# Patient Record
Sex: Female | Born: 1945 | Race: Black or African American | Hispanic: No | Marital: Married | Smoking: Never smoker
Health system: Southern US, Community
[De-identification: ages and names within clinical notes are randomized; demographics above are authoritative.]

## PROBLEM LIST (undated history)

## (undated) DIAGNOSIS — D72819 Decreased white blood cell count, unspecified: Secondary | ICD-10-CM

## (undated) DIAGNOSIS — I10 Essential (primary) hypertension: Secondary | ICD-10-CM

## (undated) DIAGNOSIS — R569 Unspecified convulsions: Secondary | ICD-10-CM

## (undated) DIAGNOSIS — E785 Hyperlipidemia, unspecified: Secondary | ICD-10-CM

## (undated) DIAGNOSIS — F0390 Unspecified dementia without behavioral disturbance: Secondary | ICD-10-CM

## (undated) DIAGNOSIS — K30 Functional dyspepsia: Secondary | ICD-10-CM

## (undated) DIAGNOSIS — E7800 Pure hypercholesterolemia, unspecified: Secondary | ICD-10-CM

## (undated) DIAGNOSIS — R32 Unspecified urinary incontinence: Secondary | ICD-10-CM

## (undated) DIAGNOSIS — J309 Allergic rhinitis, unspecified: Secondary | ICD-10-CM

## (undated) DIAGNOSIS — F329 Major depressive disorder, single episode, unspecified: Secondary | ICD-10-CM

## (undated) DIAGNOSIS — M1990 Unspecified osteoarthritis, unspecified site: Secondary | ICD-10-CM

## (undated) DIAGNOSIS — E109 Type 1 diabetes mellitus without complications: Secondary | ICD-10-CM

## (undated) DIAGNOSIS — E11319 Type 2 diabetes mellitus with unspecified diabetic retinopathy without macular edema: Secondary | ICD-10-CM

## (undated) DIAGNOSIS — R51 Headache: Secondary | ICD-10-CM

## (undated) DIAGNOSIS — I509 Heart failure, unspecified: Secondary | ICD-10-CM

## (undated) DIAGNOSIS — L989 Disorder of the skin and subcutaneous tissue, unspecified: Secondary | ICD-10-CM

## (undated) DIAGNOSIS — F411 Generalized anxiety disorder: Secondary | ICD-10-CM

## (undated) DIAGNOSIS — D649 Anemia, unspecified: Secondary | ICD-10-CM

## (undated) HISTORY — PX: ABDOMINAL HYSTERECTOMY: SHX81

## (undated) HISTORY — DX: Essential (primary) hypertension: I10

## (undated) HISTORY — PX: OTHER SURGICAL HISTORY: SHX169

## (undated) HISTORY — DX: Major depressive disorder, single episode, unspecified: F32.9

## (undated) HISTORY — DX: Allergic rhinitis, unspecified: J30.9

## (undated) HISTORY — DX: Type 2 diabetes mellitus with unspecified diabetic retinopathy without macular edema: E11.319

## (undated) HISTORY — DX: Type 1 diabetes mellitus without complications: E10.9

## (undated) HISTORY — DX: Pure hypercholesterolemia, unspecified: E78.00

## (undated) HISTORY — DX: Unspecified osteoarthritis, unspecified site: M19.90

## (undated) HISTORY — DX: Headache: R51

## (undated) HISTORY — DX: Decreased white blood cell count, unspecified: D72.819

## (undated) HISTORY — DX: Functional dyspepsia: K30

## (undated) HISTORY — DX: Unspecified urinary incontinence: R32

## (undated) HISTORY — DX: Hyperlipidemia, unspecified: E78.5

## (undated) HISTORY — DX: Generalized anxiety disorder: F41.1

---

## 1988-07-20 DIAGNOSIS — M1990 Unspecified osteoarthritis, unspecified site: Secondary | ICD-10-CM

## 1988-07-20 HISTORY — DX: Unspecified osteoarthritis, unspecified site: M19.90

## 1988-07-20 HISTORY — PX: TOTAL KNEE ARTHROPLASTY: SHX125

## 1997-11-23 ENCOUNTER — Ambulatory Visit (HOSPITAL_COMMUNITY): Admission: RE | Admit: 1997-11-23 | Discharge: 1997-11-23 | Payer: Self-pay | Admitting: Urology

## 1998-02-15 ENCOUNTER — Ambulatory Visit (HOSPITAL_COMMUNITY): Admission: RE | Admit: 1998-02-15 | Discharge: 1998-02-15 | Payer: Self-pay | Admitting: *Deleted

## 1999-03-07 ENCOUNTER — Encounter: Payer: Self-pay | Admitting: Orthopedic Surgery

## 1999-03-11 ENCOUNTER — Ambulatory Visit (HOSPITAL_COMMUNITY): Admission: RE | Admit: 1999-03-11 | Discharge: 1999-03-11 | Payer: Self-pay | Admitting: Orthopedic Surgery

## 1999-04-24 ENCOUNTER — Encounter: Payer: Self-pay | Admitting: *Deleted

## 1999-04-24 ENCOUNTER — Ambulatory Visit (HOSPITAL_COMMUNITY): Admission: RE | Admit: 1999-04-24 | Discharge: 1999-04-24 | Payer: Self-pay | Admitting: *Deleted

## 1999-09-14 ENCOUNTER — Emergency Department (HOSPITAL_COMMUNITY): Admission: EM | Admit: 1999-09-14 | Discharge: 1999-09-14 | Payer: Self-pay | Admitting: Emergency Medicine

## 1999-09-15 ENCOUNTER — Encounter: Payer: Self-pay | Admitting: Emergency Medicine

## 1999-10-27 ENCOUNTER — Ambulatory Visit (HOSPITAL_COMMUNITY): Admission: RE | Admit: 1999-10-27 | Discharge: 1999-10-27 | Payer: Self-pay | Admitting: Gastroenterology

## 1999-10-27 HISTORY — PX: PANENDOSCOPY: SHX2159

## 2000-02-01 ENCOUNTER — Emergency Department (HOSPITAL_COMMUNITY): Admission: EM | Admit: 2000-02-01 | Discharge: 2000-02-02 | Payer: Self-pay | Admitting: Emergency Medicine

## 2000-05-07 ENCOUNTER — Encounter: Payer: Self-pay | Admitting: *Deleted

## 2000-05-07 ENCOUNTER — Ambulatory Visit (HOSPITAL_COMMUNITY): Admission: RE | Admit: 2000-05-07 | Discharge: 2000-05-07 | Payer: Self-pay | Admitting: *Deleted

## 2001-06-07 ENCOUNTER — Encounter (HOSPITAL_BASED_OUTPATIENT_CLINIC_OR_DEPARTMENT_OTHER): Payer: Self-pay | Admitting: General Surgery

## 2001-06-07 ENCOUNTER — Ambulatory Visit (HOSPITAL_COMMUNITY): Admission: RE | Admit: 2001-06-07 | Discharge: 2001-06-07 | Payer: Self-pay | Admitting: General Surgery

## 2001-12-13 ENCOUNTER — Encounter: Admission: RE | Admit: 2001-12-13 | Discharge: 2001-12-13 | Payer: Self-pay | Admitting: Urology

## 2001-12-13 ENCOUNTER — Encounter: Payer: Self-pay | Admitting: Urology

## 2002-01-17 ENCOUNTER — Emergency Department (HOSPITAL_COMMUNITY): Admission: EM | Admit: 2002-01-17 | Discharge: 2002-01-17 | Payer: Self-pay | Admitting: Emergency Medicine

## 2002-06-08 ENCOUNTER — Encounter: Payer: Self-pay | Admitting: Obstetrics & Gynecology

## 2002-06-08 ENCOUNTER — Ambulatory Visit (HOSPITAL_COMMUNITY): Admission: RE | Admit: 2002-06-08 | Discharge: 2002-06-08 | Payer: Self-pay | Admitting: Obstetrics & Gynecology

## 2002-09-13 ENCOUNTER — Encounter: Payer: Self-pay | Admitting: Orthopedic Surgery

## 2002-09-14 ENCOUNTER — Inpatient Hospital Stay (HOSPITAL_COMMUNITY): Admission: RE | Admit: 2002-09-14 | Discharge: 2002-09-18 | Payer: Self-pay | Admitting: Orthopedic Surgery

## 2002-09-14 ENCOUNTER — Encounter: Payer: Self-pay | Admitting: Orthopedic Surgery

## 2002-12-20 ENCOUNTER — Ambulatory Visit (HOSPITAL_BASED_OUTPATIENT_CLINIC_OR_DEPARTMENT_OTHER): Admission: RE | Admit: 2002-12-20 | Discharge: 2002-12-20 | Payer: Self-pay | Admitting: Pulmonary Disease

## 2003-03-04 ENCOUNTER — Emergency Department (HOSPITAL_COMMUNITY): Admission: EM | Admit: 2003-03-04 | Discharge: 2003-03-04 | Payer: Self-pay

## 2003-04-11 ENCOUNTER — Encounter: Payer: Self-pay | Admitting: Endocrinology

## 2003-04-11 ENCOUNTER — Encounter: Admission: RE | Admit: 2003-04-11 | Discharge: 2003-04-11 | Payer: Self-pay | Admitting: Endocrinology

## 2003-07-24 ENCOUNTER — Ambulatory Visit (HOSPITAL_COMMUNITY): Admission: RE | Admit: 2003-07-24 | Discharge: 2003-07-24 | Payer: Self-pay | Admitting: Obstetrics & Gynecology

## 2003-10-30 ENCOUNTER — Encounter: Admission: RE | Admit: 2003-10-30 | Discharge: 2004-01-28 | Payer: Self-pay | Admitting: Endocrinology

## 2003-12-14 HISTORY — PX: OTHER SURGICAL HISTORY: SHX169

## 2004-05-27 ENCOUNTER — Ambulatory Visit: Payer: Self-pay | Admitting: Endocrinology

## 2004-06-10 ENCOUNTER — Ambulatory Visit: Payer: Self-pay | Admitting: Endocrinology

## 2004-08-18 ENCOUNTER — Ambulatory Visit: Payer: Self-pay | Admitting: Endocrinology

## 2004-08-19 ENCOUNTER — Ambulatory Visit (HOSPITAL_COMMUNITY): Admission: RE | Admit: 2004-08-19 | Discharge: 2004-08-19 | Payer: Self-pay | Admitting: Obstetrics & Gynecology

## 2004-11-11 ENCOUNTER — Ambulatory Visit: Payer: Self-pay | Admitting: Endocrinology

## 2005-01-21 ENCOUNTER — Ambulatory Visit: Payer: Self-pay | Admitting: Endocrinology

## 2005-01-26 ENCOUNTER — Ambulatory Visit: Payer: Self-pay | Admitting: Endocrinology

## 2005-03-25 ENCOUNTER — Ambulatory Visit: Payer: Self-pay | Admitting: Endocrinology

## 2005-03-30 ENCOUNTER — Ambulatory Visit: Payer: Self-pay | Admitting: Endocrinology

## 2005-04-19 LAB — HM DEXA SCAN

## 2005-06-08 ENCOUNTER — Ambulatory Visit: Payer: Self-pay | Admitting: Endocrinology

## 2005-06-22 ENCOUNTER — Ambulatory Visit: Payer: Self-pay | Admitting: Endocrinology

## 2005-08-25 ENCOUNTER — Ambulatory Visit (HOSPITAL_COMMUNITY): Admission: RE | Admit: 2005-08-25 | Discharge: 2005-08-25 | Payer: Self-pay | Admitting: Obstetrics & Gynecology

## 2005-10-21 ENCOUNTER — Ambulatory Visit: Payer: Self-pay | Admitting: Endocrinology

## 2006-02-02 ENCOUNTER — Ambulatory Visit: Payer: Self-pay | Admitting: Endocrinology

## 2006-05-11 ENCOUNTER — Ambulatory Visit: Payer: Self-pay | Admitting: Endocrinology

## 2006-05-11 LAB — CONVERTED CEMR LAB
AST: 23 units/L (ref 0–37)
Albumin: 4 g/dL (ref 3.5–5.2)
BUN: 9 mg/dL (ref 6–23)
Basophils Relative: 1.1 % — ABNORMAL HIGH (ref 0.0–1.0)
Bilirubin Urine: NEGATIVE
Creatinine, Ser: 0.7 mg/dL (ref 0.4–1.2)
Eosinophil percent: 2.9 % (ref 0.0–5.0)
HCT: 36.3 % (ref 36.0–46.0)
HDL: 78.5 mg/dL (ref >39.0)
Hemoglobin: 11.5 g/dL — ABNORMAL LOW (ref 12.0–15.0)
LDL DIRECT: 126 mg/dL
MCHC: 31.7 g/dL (ref 30.0–36.0)
Neutro Abs: 1.4 10*3/uL (ref 1.4–7.7)
Neutrophils Relative %: 40 % — ABNORMAL LOW (ref 43.0–77.0)
Potassium: 3.9 meq/L (ref 3.5–5.1)
RDW: 13.8 % (ref 11.5–14.6)
Total Bilirubin: 1 mg/dL (ref 0.3–1.2)
Triglyceride fasting, serum: 42 mg/dL (ref 0–149)
Urine Glucose: NEGATIVE mg/dL

## 2006-05-17 ENCOUNTER — Ambulatory Visit: Payer: Self-pay | Admitting: Endocrinology

## 2006-05-17 HISTORY — PX: ELECTROCARDIOGRAM: SHX264

## 2006-05-25 ENCOUNTER — Ambulatory Visit: Payer: Self-pay | Admitting: Endocrinology

## 2006-05-30 ENCOUNTER — Encounter: Admission: RE | Admit: 2006-05-30 | Discharge: 2006-05-30 | Payer: Self-pay | Admitting: Endocrinology

## 2006-06-02 ENCOUNTER — Emergency Department (HOSPITAL_COMMUNITY): Admission: EM | Admit: 2006-06-02 | Discharge: 2006-06-02 | Payer: Self-pay | Admitting: Emergency Medicine

## 2006-07-26 ENCOUNTER — Ambulatory Visit: Payer: Self-pay | Admitting: Endocrinology

## 2007-02-08 ENCOUNTER — Encounter: Payer: Self-pay | Admitting: Endocrinology

## 2007-02-08 DIAGNOSIS — J309 Allergic rhinitis, unspecified: Secondary | ICD-10-CM

## 2007-02-08 DIAGNOSIS — E109 Type 1 diabetes mellitus without complications: Secondary | ICD-10-CM

## 2007-02-08 DIAGNOSIS — F329 Major depressive disorder, single episode, unspecified: Secondary | ICD-10-CM

## 2007-02-08 DIAGNOSIS — R32 Unspecified urinary incontinence: Secondary | ICD-10-CM

## 2007-02-08 DIAGNOSIS — F411 Generalized anxiety disorder: Secondary | ICD-10-CM | POA: Insufficient documentation

## 2007-02-08 DIAGNOSIS — F3289 Other specified depressive episodes: Secondary | ICD-10-CM

## 2007-02-08 DIAGNOSIS — I10 Essential (primary) hypertension: Secondary | ICD-10-CM

## 2007-02-08 HISTORY — DX: Allergic rhinitis, unspecified: J30.9

## 2007-02-08 HISTORY — DX: Generalized anxiety disorder: F41.1

## 2007-02-08 HISTORY — DX: Other specified depressive episodes: F32.89

## 2007-02-08 HISTORY — DX: Essential (primary) hypertension: I10

## 2007-02-08 HISTORY — DX: Unspecified urinary incontinence: R32

## 2007-02-08 HISTORY — DX: Major depressive disorder, single episode, unspecified: F32.9

## 2007-02-08 HISTORY — DX: Type 1 diabetes mellitus without complications: E10.9

## 2007-03-02 ENCOUNTER — Ambulatory Visit: Payer: Self-pay | Admitting: Endocrinology

## 2007-05-25 ENCOUNTER — Ambulatory Visit: Payer: Self-pay | Admitting: Endocrinology

## 2007-09-12 ENCOUNTER — Encounter: Payer: Self-pay | Admitting: Endocrinology

## 2007-09-21 ENCOUNTER — Encounter: Payer: Self-pay | Admitting: Endocrinology

## 2008-04-30 ENCOUNTER — Encounter: Payer: Self-pay | Admitting: Endocrinology

## 2008-06-02 ENCOUNTER — Emergency Department (HOSPITAL_COMMUNITY): Admission: EM | Admit: 2008-06-02 | Discharge: 2008-06-02 | Payer: Self-pay | Admitting: Emergency Medicine

## 2008-08-15 ENCOUNTER — Ambulatory Visit: Payer: Self-pay | Admitting: Endocrinology

## 2008-08-15 DIAGNOSIS — R634 Abnormal weight loss: Secondary | ICD-10-CM

## 2008-08-15 DIAGNOSIS — R05 Cough: Secondary | ICD-10-CM

## 2008-08-15 LAB — CONVERTED CEMR LAB
ALT: 25 units/L (ref 0–35)
AST: 16 units/L (ref 0–37)
Basophils Absolute: 0.1 10*3/uL (ref 0.0–0.1)
Basophils Relative: 2.1 % (ref 0.0–3.0)
Bilirubin, Direct: 0.2 mg/dL (ref 0.0–0.3)
CO2: 31 meq/L (ref 19–32)
Chloride: 100 meq/L (ref 96–112)
Creatinine, Ser: 0.7 mg/dL (ref 0.4–1.2)
Eosinophils Absolute: 0.1 10*3/uL (ref 0.0–0.7)
GFR calc non Af Amer: 90 mL/min
Hgb A1c MFr Bld: 12.6 % — ABNORMAL HIGH (ref 4.6–6.0)
Lymphocytes Relative: 43.5 % (ref 12.0–46.0)
MCHC: 33.5 g/dL (ref 30.0–36.0)
MCV: 85.7 fL (ref 78.0–100.0)
Neutrophils Relative %: 41.3 % — ABNORMAL LOW (ref 43.0–77.0)
Platelets: 239 10*3/uL (ref 150–400)
Potassium: 4.4 meq/L (ref 3.5–5.1)
RBC: 4.79 M/uL (ref 3.87–5.11)
RDW: 12.9 % (ref 11.5–14.6)
Sodium: 137 meq/L (ref 135–145)
Total Bilirubin: 1.4 mg/dL — ABNORMAL HIGH (ref 0.3–1.2)

## 2008-08-20 ENCOUNTER — Ambulatory Visit: Payer: Self-pay | Admitting: Endocrinology

## 2008-09-19 ENCOUNTER — Ambulatory Visit: Payer: Self-pay | Admitting: Endocrinology

## 2008-09-27 ENCOUNTER — Telehealth (INDEPENDENT_AMBULATORY_CARE_PROVIDER_SITE_OTHER): Payer: Self-pay | Admitting: *Deleted

## 2008-11-05 ENCOUNTER — Ambulatory Visit: Payer: Self-pay | Admitting: Endocrinology

## 2008-11-05 DIAGNOSIS — E7800 Pure hypercholesterolemia, unspecified: Secondary | ICD-10-CM

## 2008-11-05 HISTORY — DX: Pure hypercholesterolemia, unspecified: E78.00

## 2008-11-05 LAB — CONVERTED CEMR LAB
ALT: 20 units/L (ref 0–35)
AST: 19 units/L (ref 0–37)
Albumin: 4.3 g/dL (ref 3.5–5.2)
BUN: 17 mg/dL (ref 6–23)
Basophils Relative: 0.6 % (ref 0.0–3.0)
Bilirubin Urine: NEGATIVE
Cholesterol: 292 mg/dL — ABNORMAL HIGH (ref 0–200)
Direct LDL: 158.1 mg/dL
Eosinophils Relative: 4 % (ref 0.0–5.0)
HCT: 39.3 % (ref 36.0–46.0)
HDL: 108.3 mg/dL (ref >39.00)
Hemoglobin: 13.5 g/dL (ref 12.0–15.0)
Hgb A1c MFr Bld: 12.7 % — ABNORMAL HIGH (ref 4.6–6.5)
Lymphs Abs: 1.7 10*3/uL (ref 0.7–4.0)
MCV: 83.7 fL (ref 78.0–100.0)
Monocytes Absolute: 0.3 10*3/uL (ref 0.1–1.0)
Neutro Abs: 1.1 10*3/uL — ABNORMAL LOW (ref 1.4–7.7)
Platelets: 232 10*3/uL (ref 150.0–400.0)
Specific Gravity, Urine: >=1.03 (ref 1.000–1.030)
TSH: 0.65 microintl units/mL (ref 0.35–5.50)
Total Bilirubin: 1.6 mg/dL — ABNORMAL HIGH (ref 0.3–1.2)
Total CHOL/HDL Ratio: 3
Total Protein, Urine: NEGATIVE mg/dL
Total Protein: 7.1 g/dL (ref 6.0–8.3)
Triglycerides: 35 mg/dL (ref 0.0–149.0)
WBC: 3.2 10*3/uL — ABNORMAL LOW (ref 4.5–10.5)
pH: 5.5 (ref 5.0–8.0)

## 2008-11-06 ENCOUNTER — Telehealth (INDEPENDENT_AMBULATORY_CARE_PROVIDER_SITE_OTHER): Payer: Self-pay | Admitting: *Deleted

## 2008-11-06 DIAGNOSIS — R51 Headache: Secondary | ICD-10-CM

## 2008-11-06 DIAGNOSIS — R519 Headache, unspecified: Secondary | ICD-10-CM | POA: Insufficient documentation

## 2008-11-06 HISTORY — DX: Headache: R51

## 2008-11-09 ENCOUNTER — Telehealth (INDEPENDENT_AMBULATORY_CARE_PROVIDER_SITE_OTHER): Payer: Self-pay | Admitting: *Deleted

## 2008-11-16 ENCOUNTER — Encounter (INDEPENDENT_AMBULATORY_CARE_PROVIDER_SITE_OTHER): Payer: Self-pay | Admitting: *Deleted

## 2008-12-24 ENCOUNTER — Encounter: Payer: Self-pay | Admitting: Endocrinology

## 2009-02-04 ENCOUNTER — Encounter: Payer: Self-pay | Admitting: Internal Medicine

## 2009-05-07 ENCOUNTER — Telehealth: Payer: Self-pay | Admitting: Endocrinology

## 2009-05-17 ENCOUNTER — Telehealth (INDEPENDENT_AMBULATORY_CARE_PROVIDER_SITE_OTHER): Payer: Self-pay | Admitting: *Deleted

## 2009-06-07 ENCOUNTER — Telehealth (INDEPENDENT_AMBULATORY_CARE_PROVIDER_SITE_OTHER): Payer: Self-pay | Admitting: *Deleted

## 2009-11-30 ENCOUNTER — Emergency Department (HOSPITAL_COMMUNITY): Admission: EM | Admit: 2009-11-30 | Discharge: 2009-11-30 | Payer: Self-pay | Admitting: Emergency Medicine

## 2010-04-30 ENCOUNTER — Encounter: Payer: Self-pay | Admitting: Endocrinology

## 2010-04-30 ENCOUNTER — Telehealth: Payer: Self-pay | Admitting: Endocrinology

## 2010-06-24 ENCOUNTER — Ambulatory Visit: Payer: Self-pay | Admitting: Endocrinology

## 2010-06-24 LAB — CONVERTED CEMR LAB
ALT: 17 units/L (ref 0–35)
Albumin: 4.3 g/dL (ref 3.5–5.2)
Bilirubin, Direct: 0.2 mg/dL (ref 0.0–0.3)
CO2: 31 meq/L (ref 19–32)
Calcium: 9.4 mg/dL (ref 8.4–10.5)
Cholesterol: 284 mg/dL — ABNORMAL HIGH (ref 0–200)
Creatinine, Ser: 0.5 mg/dL (ref 0.4–1.2)
Direct LDL: 154.4 mg/dL
Eosinophils Relative: 1.6 % (ref 0.0–5.0)
GFR calc non Af Amer: 151.57 mL/min (ref >60.00)
Glucose, Bld: 325 mg/dL — ABNORMAL HIGH (ref 70–99)
HCT: 38.6 % (ref 36.0–46.0)
Hemoglobin: 13.1 g/dL (ref 12.0–15.0)
Lymphs Abs: 1.6 10*3/uL (ref 0.7–4.0)
MCV: 85.6 fL (ref 78.0–100.0)
Monocytes Absolute: 0.4 10*3/uL (ref 0.1–1.0)
Monocytes Relative: 9.3 % (ref 3.0–12.0)
Neutro Abs: 1.7 10*3/uL (ref 1.4–7.7)
Platelets: 258 10*3/uL (ref 150.0–400.0)
RDW: 14.2 % (ref 11.5–14.6)
Sodium: 135 meq/L (ref 135–145)
Total Protein: 7.1 g/dL (ref 6.0–8.3)
Triglycerides: 33 mg/dL (ref 0.0–149.0)
WBC: 3.8 10*3/uL — ABNORMAL LOW (ref 4.5–10.5)

## 2010-06-30 ENCOUNTER — Telehealth: Payer: Self-pay | Admitting: Endocrinology

## 2010-07-02 ENCOUNTER — Encounter (INDEPENDENT_AMBULATORY_CARE_PROVIDER_SITE_OTHER): Payer: Self-pay | Admitting: *Deleted

## 2010-08-05 ENCOUNTER — Ambulatory Visit: Admit: 2010-08-05 | Payer: Self-pay | Admitting: Endocrinology

## 2010-08-05 ENCOUNTER — Ambulatory Visit: Admit: 2010-08-05 | Payer: Self-pay | Admitting: Internal Medicine

## 2010-08-05 ENCOUNTER — Encounter (INDEPENDENT_AMBULATORY_CARE_PROVIDER_SITE_OTHER): Payer: Self-pay | Admitting: *Deleted

## 2010-08-09 ENCOUNTER — Encounter (HOSPITAL_BASED_OUTPATIENT_CLINIC_OR_DEPARTMENT_OTHER): Payer: Self-pay | Admitting: General Surgery

## 2010-08-10 ENCOUNTER — Encounter: Payer: Self-pay | Admitting: Obstetrics & Gynecology

## 2010-08-10 ENCOUNTER — Encounter: Payer: Self-pay | Admitting: Endocrinology

## 2010-08-13 ENCOUNTER — Ambulatory Visit: Admit: 2010-08-13 | Payer: Self-pay | Admitting: Internal Medicine

## 2010-08-17 LAB — CONVERTED CEMR LAB
ALT: 21 units/L (ref 0–35)
BUN: 7 mg/dL (ref 6–23)
Basophils Absolute: 0 10*3/uL (ref 0.0–0.1)
Bilirubin, Direct: 0.3 mg/dL (ref 0.0–0.3)
CO2: 32 meq/L (ref 19–32)
Calcium: 9.3 mg/dL (ref 8.4–10.5)
Cholesterol: 149 mg/dL (ref 0–200)
Creatinine,U: 109.3 mg/dL
Eosinophils Absolute: 0.1 10*3/uL (ref 0.0–0.6)
GFR calc Af Amer: 130 mL/min
GFR calc non Af Amer: 107 mL/min
Glucose, Bld: 283 mg/dL — ABNORMAL HIGH (ref 70–99)
HDL: 78.7 mg/dL (ref >39.0)
Hemoglobin, Urine: NEGATIVE
Hgb A1c MFr Bld: 12.5 % — ABNORMAL HIGH (ref 4.6–6.0)
Ketones, ur: NEGATIVE mg/dL
MCHC: 33.8 g/dL (ref 30.0–36.0)
MCV: 84.8 fL (ref 78.0–100.0)
Microalb Creat Ratio: 7.3 mg/g (ref 0.0–30.0)
Microalb, Ur: 0.8 mg/dL (ref 0.0–1.9)
Monocytes Relative: 8 % (ref 3.0–11.0)
Neutro Abs: 1.8 10*3/uL (ref 1.4–7.7)
Platelets: 221 10*3/uL (ref 150–400)
Potassium: 4.2 meq/L (ref 3.5–5.1)
RBC: 4.01 M/uL (ref 3.87–5.11)
Total CHOL/HDL Ratio: 1.9
Total Protein: 6.3 g/dL (ref 6.0–8.3)
Triglycerides: 22 mg/dL (ref 0–149)
Urine Glucose: >=1000 mg/dL — CR
Urobilinogen, UA: 0.2 (ref 0.0–1.0)
pH: 6 (ref 5.0–8.0)

## 2010-08-21 NOTE — Assessment & Plan Note (Signed)
Summary: FU/ DIDN'T WANT A PHYSICAL/NWS  #   Vital Signs:  Patient profile:   65 year old female Height:      64 inches (162.56 cm) Weight:      131 pounds (59.55 kg) BMI:     22.57 O2 Sat:      99 % on Room air Temp:     98.4 degrees F (36.89 degrees C) oral Pulse rate:   95 / minute BP sitting:   138 / 70  (left arm) Cuff size:   regular  Vitals Entered By: Brenton Grills CMA Duncan Dull) (June 24, 2010 2:32 PM)  O2 Flow:  Room air CC: Follow-up visit per pt/aj Is Patient Diabetic? Yes   Primary Ernie Sagrero:  Dr. Everardo All  CC:  Follow-up visit per pt/aj.  History of Present Illness: the status of at least 3 ongoing medical problems is addressed today: dm: no cbg record, but states cbg's are well-controlled.  she has not taken her insulin since 1 month ago.   denies sob. dyslipidemia: she does not take vytorin.  denies chest pain weight loss: persists.  denies n/v. htn: she does not take normodyne  Current Medications (verified): 1)  Premarin 0.625 Mg  Tabs (Estrogens Conjugated) .... Take 1 By Mouth Qd 2)  Allegra 180 Mg  Tabs (Fexofenadine Hcl) .... Take Prn 3)  Vicodin 5-500 Mg  Tabs (Hydrocodone-Acetaminophen) .... Take 1 Q 4 Hours Prn 4)  Trazodone Hcl 50 Mg  Tabs (Trazodone Hcl) .... Take 1 Qhs 5)  Bd U/f Mini Pen Needle 31g X 5 Mm Misc (Insulin Pen Needle) .... Use As Directed Physical Is Due in October No Addtional Refills Until Appt 6)  Carvedilol 3.125 Mg Tabs (Carvedilol) .... Bid 7)  Metformin Hcl 500 Mg Xr24h-Tab (Metformin Hcl) .... Take 1 Two Times A Day 8)  Citalopram Hydrobromide 40 Mg Tabs (Citalopram Hydrobromide) .... Qd 9)  Novolog Mix 70/30 Flexpen 70-30 % Susp (Insulin Aspart Prot & Aspart) .Marland Kitchen.. 10 Units Qam 10)  Vytorin 10-80 Mg Tabs (Ezetimibe-Simvastatin) .... Take 1 By Mouth At Bedtime 11)  Labetalol Hcl 200 Mg Tabs (Labetalol Hcl) .... Take 2 By Mouth Two Times A Day 12)  Vesicare 5 Mg Tabs (Solifenacin Succinate) .Marland Kitchen.. 1 Tab Qam 13)  Fluticasone  Propionate 50 Mcg/act Susp (Fluticasone Propionate) .... 2 Sprays Each Side Qd  Allergies (verified): 1)  ! Aspirin 2)  ! Codeine  Past History:  Past Medical History: Last updated: 09/19/2008 Allergic rhinitis Anxiety Depression Diabetes mellitus, type I Hypertension Urinary incontinence Non-Ulcer Dyspepsia Dyslipidemia Leukopenia Background DM Retinopathy Disabled due 2 OA (1990)  Review of Systems  The patient denies abdominal pain and hematochezia.    Physical Exam  General:  normal appearance.   Extremities:  no edema Additional Exam:  Hemoglobin A1C       [H]  15.1 %                      4.6-6.5  Cholesterol          [H]  284 mg/dL                   0-454   Triglycerides             33.0 mg/dL                  0.9-811.9   HDL  97.50 mg/dL                 >11.91    White Cell Count     [L]  3.8 K/uL                    4.5-10.5   Red Cell Count            4.51 Mil/uL                 3.87-5.11   Hemoglobin                13.1 g/dL                   47.8-29.5   Hematocrit                38.6 %                      36.0-46.0   Platelet Count            258.0 K/uL                  150.0-400.0  Cholesterol LDL     154.4 mg/dL    Impression & Recommendations:  Problem # 1:  DIABETES MELLITUS, TYPE I (ICD-250.01) Assessment Deteriorated very poor control  Problem # 2:  HYPERCHOLESTEROLEMIA (ICD-272.0) uncertain etiology  Problem # 3:  WEIGHT LOSS (ICD-783.21) prob due to #1  Problem # 4:  HYPERTENSION (ICD-401.9) well-controlled  Medications Added to Medication List This Visit: 1)  Humalog Mix 75/25 Kwikpen 75-25 % Susp (Insulin lispro prot & lispro) .Marland Kitchen.. 10 units with the first meal of the day, and pen needles once daily 2)  Bilat Screening Mammogram   Other Orders: Flu Vaccine 45yrs + MEDICARE PATIENTS (A2130) Administration Flu vaccine - MCR (G0008) Pneumococcal Vaccine (86578) Admin 1st Vaccine (46962) TLB-A1C / Hgb A1C  (Glycohemoglobin) (83036-A1C) TLB-Lipid Panel (80061-LIPID) TLB-CBC Platelet - w/Differential (85025-CBCD) TLB-BMP (Basic Metabolic Panel-BMET) (80048-METABOL) TLB-TSH (Thyroid Stimulating Hormone) (84443-TSH) TLB-Hepatic/Liver Function Pnl (80076-HEPATIC) Est. Patient Level IV (95284)  Patient Instructions: 1)  blood tests are being ordered for you today.  please call [phone] to hear your test results. 2)  in case your blood sugar is high, here is a printed prescription for humalog 75/25, 10 units, with the first meal of the day. 3)  check your blood glucose 2 times a day.  vary the time of day between before the 3 meals and at bedtime.  also check if you feel as though your glucose might be very high or too low.  bring a record of this to your doctor appointments.   4)  Please schedule a follow-up appointment in 6 weeks. 5)  here is a prescription to get a mammogram. 6)  (update: i left message on phone-tree:  start insulin.  ret 1-2 weeks. you should take zocor) Prescriptions: BILAT SCREENING MAMMOGRAM   #0 x 0   Entered and Authorized by:   Minus Breeding MD   Signed by:   Minus Breeding MD on 06/24/2010   Method used:   Print then Give to Patient   RxID:   [id number] HUMALOG MIX 75/25 KWIKPEN 75-25 % SUSP (INSULIN LISPRO PROT & LISPRO) 10 units with the first meal of the day, and pen needles once daily  #1 box x 0   Entered and Authorized by:   Minus Breeding MD   Signed by:  Minus Breeding MD on 06/24/2010   Method used:   Print then Give to Patient   RxID:   [id number] HUMALOG MIX 75/25 KWIKPEN 75-25 % SUSP (INSULIN LISPRO PROT & LISPRO) 10 units with the first meal of the day, and pen needles once daily  #1 box x 0   Entered and Authorized by:   Minus Breeding MD   Signed by:   Minus Breeding MD on 06/24/2010   Method used:   Print then Give to Patient   RxID:   [id number]    Orders Added: 1)  Flu Vaccine 41yrs + MEDICARE PATIENTS [Q2039] 2)   Administration Flu vaccine - MCR [G0008] 3)  Pneumococcal Vaccine [90732] 4)  Admin 1st Vaccine [90471] 5)  TLB-A1C / Hgb A1C (Glycohemoglobin) [83036-A1C] 6)  TLB-Lipid Panel [80061-LIPID] 7)  TLB-CBC Platelet - w/Differential [85025-CBCD] 8)  TLB-BMP (Basic Metabolic Panel-BMET) [80048-METABOL] 9)  TLB-TSH (Thyroid Stimulating Hormone) [84443-TSH] 10)  TLB-Hepatic/Liver Function Pnl [80076-HEPATIC] 11)  Est. Patient Level IV [84696]   Immunizations Administered:  Pneumonia Vaccine:    Vaccine Type: Pneumovax    Site: left deltoid    Mfr: Merck    Dose: 0.5 ml    Route: IM    Given by: Brenton Grills CMA (AAMA)    Exp. Date: 11/14/2011    Lot #: [id number]    VIS given: 06/24/09 version given June 24, 2010.   Immunizations Administered:  Pneumonia Vaccine:    Vaccine Type: Pneumovax    Site: left deltoid    Mfr: Merck    Dose: 0.5 ml    Route: IM    Given by: Brenton Grills CMA (AAMA)    Exp. Date: 11/14/2011    Lot #: [id number]    VIS given: 06/24/09 version given June 24, 2010.  Flu Vaccine Consent Questions     Do you have a history of severe allergic reactions to this vaccine? no    Any prior history of allergic reactions to egg and/or gelatin? no    Do you have a sensitivity to the preservative Thimersol? no    Do you have a past history of Guillan-Barre Syndrome? no    Do you currently have an acute febrile illness? no    Have you ever had a severe reaction to latex? no    Vaccine information given and explained to patient? yes    Are you currently pregnant? no    Lot Number:[id number]   Exp Date:01/17/2011   Site Given  Right Deltoid IM       .[email or web address]

## 2010-08-21 NOTE — Medication Information (Signed)
Summary: Diabetes Supplies[facility]  [facility]abetes Supplies/[facility]   Imported By: Sherian Rein 05/02/2010 13:55:45  _____________________________________________________________________  External Attachment:    Type:   Image     Comment:   External Document

## 2010-08-21 NOTE — Progress Notes (Signed)
Summary: CPX due  Phone Note Outgoing Call Call back at [facility] Phone [phone]   Call placed by: Brenton Grills MA,  April 30, 2010 3:35 PM Call placed to: Patient Summary of Call: Per MD, pt is due for CPX. Left message for pt to callback office Initial call taken by: Brenton Grills MA,  April 30, 2010 3:35 PM  Follow-up for Phone Call        pt states she will callback to schedule CPX appointment  Follow-up by: Brenton Grills MA,  May 01, 2010 4:17 PM

## 2010-08-21 NOTE — Letter (Signed)
Summary: Pre Visit No Show Letter  [facility]  [address] [place], [place] [address]   Phone: [phone]  Fax: [phone]        August 05, 2010 MRN: [id number]    TERIN DIEROLF [address] [place], [place]  [address]    Dear Ms. POHLMAN,   We have been unable to reach you by phone concerning the pre-procedure visit that you missed on Tuesday 08/05/2010. For this reason,your procedure scheduled on Wednesday 08/13/2010 has been cancelled. Our scheduling staff will gladly assist you with rescheduling your appointments at a more convenient time. Please call our office at [phone] between the hours of 8:00am and 5:00pm, press option #2 to reach an appointment scheduler. Please consider updating your contact numbers at this time so that we can reach you by phone in the future with schedule changes or results.    Thank you,    Ezra Sites RN [facility]

## 2010-08-21 NOTE — Progress Notes (Signed)
Summary: colonoscopy  ---- Converted from flag ---- ---- 06/25/2010 1:18 PM, Brenton Grills CMA (AAMA) wrote: chart ordered, per chart pt has not had a colonoscopy  ---- 06/24/2010 3:16 PM, Minus Breeding MD wrote: paper chart please ? colonoscopy ------------------------------  please call pt, and advise her that colonoscopy is due, thank you.Phone Note Outgoing Call   Call placed by: Brenton Grills CMA Duncan Dull),  June 30, 2010 11:30 AM Call placed to: Patient Summary of Call: Pt advised that colonoscopy is due, pt would like to come in January near her appt date  Initial call taken by: Brenton Grills CMA Duncan Dull),  June 30, 2010 11:31 AM  Follow-up for Phone Call        i requested colonoscopy Follow-up by: Minus Breeding MD,  June 30, 2010 12:39 PM

## 2010-08-21 NOTE — Letter (Signed)
Summary: Pre Visit Letter Revised  [facility]  [address] [place], [place] [address]   Phone: [phone]  Fax: [phone]        07/02/2010 MRN: [id number] Dana [name] [address] [place], [place]  [address]             Procedure Date:  08/13/2010  Welcome to the Gastroenterology Division at [facility].    You are scheduled to see a nurse for your pre-procedure visit on 08/05/2010 at 2:30PM on the 3rd floor at [facility], [address]  We ask that you try to arrive at our office 15 minutes prior to your appointment time to allow for check-in.  Please take a minute to review the attached form.  If you answer "Yes" to one or more of the questions on the first page, we ask that you call the person listed at your earliest opportunity.  If you answer "No" to all of the questions, please complete the rest of the form and bring it to your appointment.    Your nurse visit will consist of discussing your medical and surgical history, your immediate family medical history, and your medications.   If you are unable to list all of your medications on the form, please bring the medication bottles to your appointment and we will list them.  We will need to be aware of both prescribed and over the counter drugs.  We will need to know exact dosage information as well.    Please be prepared to read and sign documents such as consent forms, a financial agreement, and acknowledgement forms.  If necessary, and with your consent, a friend or relative is welcome to sit-in on the nurse visit with you.  Please bring your insurance card so that we may make a copy of it.  If your insurance requires a referral to see a specialist, please bring your referral form from your primary care physician.  No co-pay is required for this nurse visit.     If you cannot keep your appointment, please call [phone] to cancel or reschedule prior to your appointment date.  This allows [place]  the opportunity to schedule an appointment for another patient in need of care.    Thank you for choosing [facility] for your medical needs.  We appreciate the opportunity to care for you.  Please visit [place] at our website  to learn more about our practice.  Sincerely, The Gastroenterology Division

## 2010-12-05 NOTE — Assessment & Plan Note (Signed)
[facility]                                   ON-CALL NOTE   Dana [name], Dana [name]                       MRN:          [id number]  DATE:05/29/2006                            DOB:          Dec 28, 1945    TIME CALL RECEIVED:  4:07 P.M.   TELEPHONE NUMBER:  [phone].   CALL RECEIVED:  The message in my pager was a question about medications.  Unfortunately, I tried to call her numerous times this evening with no  answer. I finally left a message on her answering machine to call me back if  she still needed assistance.    ______________________________  Tera Mater. Clent Ridges, MD    SAF/MedQ  DD: 05/29/2006  DT: 05/29/2006  Job #: [id number]   cc:   Gregary Signs A. Everardo All, MD

## 2010-12-05 NOTE — Op Note (Signed)
NAME:  Dana [name], Dana [name]                          ACCOUNT NO.:  [id number]   MEDICAL RECORD NO.:  [id number]                   PATIENT TYPE:  INP   LOCATION:  2550                                 FACILITY:  [facility]   PHYSICIAN:  Myrtie Neither, M.D.                 DATE OF BIRTH:  1946-03-16   DATE OF PROCEDURE:  09/14/2002  DATE OF DISCHARGE:                                 OPERATIVE REPORT   PREOPERATIVE DIAGNOSIS:  Painful left total knee.   POSTOPERATIVE DIAGNOSIS:  Poly wear, tibial component, and split end,  patellar component with loosening.   PROCEDURE:  Total knee revision with revision of the patella as well as the  tibial polyethylene.   DESCRIPTION OF PROCEDURE:  The patient was taken to the operating room after  given adequate preoperative medication, given general anesthesia and  intubated.  The left knee and lower leg were prepped with Duraprep and  draped in a sterile manner.  Tourniquet and Bovie were used for hemostasis.  A midline incision was made over the left knee going through her old  previous scar, going through the skin and subcutaneous tissue.  Sharp and  blunt dissection was made both medially and laterally.  A medial paramedian  incision was made through the capsule through the capsule from the tibial  tuberosity up to the quadriceps.  The patella was reflected laterally.  Immediately, a split in the patella surface was noted which appeared to  demonstrate some separation of the poly as a blister-type lesion.  The  tibial poly itself was shown to demonstrate wear with thinning.  The femoral  component was intact, as well as the tibial tray.  Synovial tissue was  resected about the patella and the patella was lifted out, along with the  methylmethacrylate.  The surface was roughened up with a curette and drill  bit.  Small holes were drilled into the patella to allow cement purchase.  Copious irrigation with Simpulse was done.  With the knee in the flexed  position, a T in the tibial poly was removed and the polyethylene tibial  component was removed.  Again, this was demonstrating wear both medially and  laterally, with more wear over the medial compartment.  Measurement  demonstrated that the tibial component had gone from a 10 mm thickness down  to 6 mm over the medial compartment, and 7 mm over the lateral compartment.  Copious irrigation was done.  Cultures - anaerobic and aerobic and gram  stains were done prior to removal of the components.  With the knee in the  slightly extended position due to the wear, a 12 mm trial was put in place  and was found to give full extension, full flexion, good stability medially  and laterally.  With the tibial and patellar trial, again full flexion, full  extension, no subluxation of the patella was noted.  The tibial component  was then put in place and locked with the key.  The patella, after  irrigation was done, was then cemented in place and excess  methylmethacrylate was removed.  Further copious irrigation with antibiotic  solution was done.  This was followed with wound closure with 0 Vicryl for  the fascia, 2-0 for the subcutaneous, skin staples for the skin.  Compressive dressing was applied and a knee immobilizer applied.  The  patient tolerated the procedure quite well and went to the recovery room in  stable and satisfactory condition.                                               Myrtie Neither, M.D.    AC/MEDQ  D:  09/14/2002  T:  09/14/2002  Job:  [id number]
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
## 2010-12-05 NOTE — Assessment & Plan Note (Signed)
[facility]                                   ON-CALL NOTE   Dana [name], Dana [name]                       MRN:          [id number]  DATE:05/29/2006                            DOB:          03/03/1946    TELEPHONE TRIAGE NOTE   TIME RECEIVED:  10:24 p.m.   The caller is the same.  She sees Dr. Everardo All.  Telephone [phone].   The patient is scheduled for an MRI scan of her head tomorrow afternoon at  4:30.  She is extremely claustrophobic, and is requesting that I call in a  Valium tablet for her to take beforehand.  She has used this in the past,  and been successful.  She tried to contact Dr. George Hugh office twice this  past week for this request, but never got an answer.   Her allergies include ASPIRIN and CODEINE.   My response is to call in a single Valium 5 mg tablet to take 1 hour prior  to her procedure with no refills to [facility] at [phone].    ______________________________  Tera Mater. Clent Ridges, MD    SAF/MedQ  DD: 05/29/2006  DT: 05/29/2006  Job #: [id number]   cc:   Gregary Signs A. Everardo All, MD

## 2011-03-20 ENCOUNTER — Telehealth: Payer: Self-pay | Admitting: *Deleted

## 2011-03-20 NOTE — Telephone Encounter (Signed)
Need rx for Cle[employer] Choice meter strips and lancets. Pt is testing qd.  Call Rx in at [phone] [facility]

## 2011-03-24 NOTE — Telephone Encounter (Signed)
Pt needs testing supplies. Fax for diabetic testing supplies will be sent to main fax.

## 2011-06-10 ENCOUNTER — Telehealth: Payer: Self-pay | Admitting: *Deleted

## 2011-06-10 NOTE — Telephone Encounter (Signed)
Per MD, pt is due for F/U OV. Pt states that she doesn't have transportation at the moment and will callback office when she does to make an appointment.

## 2011-06-23 ENCOUNTER — Ambulatory Visit: Payer: Self-pay | Admitting: Endocrinology

## 2011-06-25 ENCOUNTER — Ambulatory Visit: Payer: Self-pay | Admitting: Endocrinology

## 2011-06-25 DIAGNOSIS — Z0289 Encounter for other administrative examinations: Secondary | ICD-10-CM

## 2011-06-29 ENCOUNTER — Ambulatory Visit: Payer: Self-pay | Admitting: Endocrinology

## 2011-06-29 DIAGNOSIS — Z0289 Encounter for other administrative examinations: Secondary | ICD-10-CM

## 2011-06-30 ENCOUNTER — Ambulatory Visit (INDEPENDENT_AMBULATORY_CARE_PROVIDER_SITE_OTHER): Payer: Medicare Other | Admitting: Endocrinology

## 2011-06-30 ENCOUNTER — Encounter: Payer: Self-pay | Admitting: Endocrinology

## 2011-06-30 DIAGNOSIS — Z79899 Other long term (current) drug therapy: Secondary | ICD-10-CM

## 2011-06-30 DIAGNOSIS — R634 Abnormal weight loss: Secondary | ICD-10-CM

## 2011-06-30 DIAGNOSIS — E7800 Pure hypercholesterolemia, unspecified: Secondary | ICD-10-CM

## 2011-06-30 DIAGNOSIS — I10 Essential (primary) hypertension: Secondary | ICD-10-CM

## 2011-06-30 DIAGNOSIS — F411 Generalized anxiety disorder: Secondary | ICD-10-CM

## 2011-06-30 DIAGNOSIS — E109 Type 1 diabetes mellitus without complications: Secondary | ICD-10-CM

## 2011-06-30 DIAGNOSIS — R609 Edema, unspecified: Secondary | ICD-10-CM

## 2011-06-30 DIAGNOSIS — R32 Unspecified urinary incontinence: Secondary | ICD-10-CM

## 2011-06-30 NOTE — Progress Notes (Signed)
  Subjective:    Patient ID: Dana [name], female    DOB: 1946-02-04, 65 y.o.   MRN: [id number]  HPI Pt states 1 week of slight swelling of the legs, but no assoc pain. She returns for f/u of type 2 DM (1987).  She has not recently checked cbg.   Past Medical History  Diagnosis Date  . ALLERGIC RHINITIS 02/08/2007  . ANXIETY 02/08/2007  . Cough 08/15/2008  . DEPRESSION 02/08/2007  . DIABETES MELLITUS, TYPE I 02/08/2007  . Headache 11/06/2008  . HYPERCHOLESTEROLEMIA 11/05/2008  . HYPERTENSION 02/08/2007  . URINARY INCONTINENCE 02/08/2007  . Dyslipidemia   . Non-ulcer dyspepsia   . Leukopenia   . DM retinopathy     background  . OA (osteoarthritis) 1990    disabled due to OA    Past Surgical History  Procedure Date  . Total knee arthroplasty 1990    left  . Panendoscopy 10/27/1999  . Stress cardiolite 12/14/2003  . Electrocardiogram 05/17/2006    History   Social History  . Marital Status: Married    Spouse Name: N/A    Number of Children: N/A  . Years of Education: N/A   Occupational History  . Retired    Social History Main Topics  . Smoking status: Never Smoker   . Smokeless tobacco: Not on file  . Alcohol Use: Not on file  . Drug Use: Not on file  . Sexually Active: Not on file   Other Topics Concern  . Not on file   Social History Narrative  . No narrative on file    No current outpatient prescriptions on file prior to visit.    Allergies  Allergen Reactions  . Aspirin   . Codeine     REACTION: nausea    Family History  Problem Relation Age of Onset  . Alcohol abuse Son   . Cancer Neg Hx     no cancer in immediate family    BP 150/80  Pulse 91  Temp(Src) 98.7 F (37.1 C) (Oral)  Ht 5\' 3"  (1.6 m)  Wt 127 lb (57.607 kg)  BMI 22.50 kg/m2  SpO2 97%    Review of Systems  Constitutional: Negative for fever.  HENT: Negative for hearing loss.   Eyes: Negative for visual disturbance.  Cardiovascular: Negative for chest pain.    Gastrointestinal: Negative for anal bleeding.  Genitourinary: Negative for hematuria.  Musculoskeletal: Negative for gait problem.  Skin: Negative for rash.  Neurological: Negative for syncope, numbness and headaches.  Hematological: Does not bruise/bleed easily.  Psychiatric/Behavioral: Positive for dysphoric mood.   Denies sob and urinary frequency.     Objective:   Physical Exam VITAL SIGNS:  See vs page GENERAL: no distress Pulses: dorsalis pedis intact bilat.   Feet: no deformity.  no ulcer on the feet.  feet are of normal color and temp.  1+ bilat leg edema.  There is bilateral onychomycosis. Neuro: sensation is intact to touch on the feet.      Assessment & Plan:  DM.  therapy limited by severe noncompliance.  i'll do the best i can. She has not recently been on insulin, or checked cbg Weight loss, slightly more since last ov--? Due to DM Edema, uncertain etiology Depression--uncertain etiology--? Related to med probs HTN, with ? Of situational component

## 2011-06-30 NOTE — Patient Instructions (Addendum)
blood tests are being requested for you today.  please call [phone] to hear your test results.  You will be prompted to enter the 9-digit "MRN" number that appears at the top left of this page, followed by #.  Then you will hear the message. Please come back for a regular physical appointment for 1 month.  Please make an appointment. We'll recheck your blood pressure then.

## 2011-07-24 ENCOUNTER — Telehealth: Payer: Self-pay | Admitting: *Deleted

## 2011-07-24 NOTE — Telephone Encounter (Signed)
Pt informed orders in system.

## 2011-07-24 NOTE — Telephone Encounter (Signed)
Lab requests are already in the computer.  Come in any time

## 2011-07-24 NOTE — Telephone Encounter (Signed)
Pt would like lab orders placed in lab to check for dehydration.

## 2011-08-11 ENCOUNTER — Encounter: Payer: Medicare Other | Admitting: Endocrinology

## 2011-08-11 DIAGNOSIS — Z0289 Encounter for other administrative examinations: Secondary | ICD-10-CM

## 2011-12-08 ENCOUNTER — Telehealth: Payer: Self-pay | Admitting: Endocrinology

## 2011-12-08 NOTE — Telephone Encounter (Signed)
Pt's spouse informed of MD's advisement.  

## 2011-12-08 NOTE — Telephone Encounter (Signed)
Please encourage pt to come in for appt

## 2011-12-08 NOTE — Telephone Encounter (Signed)
Pt is having short term memory loss, weight loss, not taking any meds. She has falling a couple times. Spouse is very concerned thinks she needs appt but pt refuses to come in. Please Advise

## 2011-12-10 ENCOUNTER — Ambulatory Visit: Payer: Medicare Other | Admitting: Endocrinology

## 2011-12-11 ENCOUNTER — Ambulatory Visit: Payer: Medicare Other | Admitting: Endocrinology

## 2011-12-11 DIAGNOSIS — Z0289 Encounter for other administrative examinations: Secondary | ICD-10-CM

## 2012-01-14 ENCOUNTER — Ambulatory Visit: Payer: Medicare Other | Admitting: Endocrinology

## 2012-01-14 DIAGNOSIS — Z0289 Encounter for other administrative examinations: Secondary | ICD-10-CM

## 2012-04-04 ENCOUNTER — Emergency Department (HOSPITAL_COMMUNITY): Payer: Medicare Other

## 2012-04-04 ENCOUNTER — Inpatient Hospital Stay (HOSPITAL_COMMUNITY)
Admission: EM | Admit: 2012-04-04 | Discharge: 2012-04-07 | DRG: 882 | Disposition: A | Discharge status: Home Health | Payer: Medicare Other | Attending: Internal Medicine | Admitting: Internal Medicine

## 2012-04-04 ENCOUNTER — Encounter (HOSPITAL_COMMUNITY): Payer: Self-pay | Admitting: *Deleted

## 2012-04-04 DIAGNOSIS — R51 Headache: Secondary | ICD-10-CM

## 2012-04-04 DIAGNOSIS — E7800 Pure hypercholesterolemia, unspecified: Secondary | ICD-10-CM | POA: Diagnosis present

## 2012-04-04 DIAGNOSIS — F22 Delusional disorders: Secondary | ICD-10-CM | POA: Diagnosis present

## 2012-04-04 DIAGNOSIS — E785 Hyperlipidemia, unspecified: Secondary | ICD-10-CM | POA: Diagnosis present

## 2012-04-04 DIAGNOSIS — M1990 Unspecified osteoarthritis, unspecified site: Secondary | ICD-10-CM | POA: Diagnosis present

## 2012-04-04 DIAGNOSIS — I10 Essential (primary) hypertension: Secondary | ICD-10-CM | POA: Diagnosis present

## 2012-04-04 DIAGNOSIS — E11319 Type 2 diabetes mellitus with unspecified diabetic retinopathy without macular edema: Secondary | ICD-10-CM | POA: Diagnosis present

## 2012-04-04 DIAGNOSIS — R634 Abnormal weight loss: Secondary | ICD-10-CM

## 2012-04-04 DIAGNOSIS — R410 Disorientation, unspecified: Secondary | ICD-10-CM | POA: Diagnosis present

## 2012-04-04 DIAGNOSIS — E1139 Type 2 diabetes mellitus with other diabetic ophthalmic complication: Secondary | ICD-10-CM | POA: Diagnosis present

## 2012-04-04 DIAGNOSIS — F0390 Unspecified dementia without behavioral disturbance: Secondary | ICD-10-CM | POA: Diagnosis present

## 2012-04-04 DIAGNOSIS — E109 Type 1 diabetes mellitus without complications: Secondary | ICD-10-CM

## 2012-04-04 DIAGNOSIS — F411 Generalized anxiety disorder: Secondary | ICD-10-CM

## 2012-04-04 DIAGNOSIS — F329 Major depressive disorder, single episode, unspecified: Secondary | ICD-10-CM

## 2012-04-04 DIAGNOSIS — R32 Unspecified urinary incontinence: Secondary | ICD-10-CM

## 2012-04-04 DIAGNOSIS — F4541 Pain disorder exclusively related to psychological factors: Principal | ICD-10-CM | POA: Diagnosis present

## 2012-04-04 DIAGNOSIS — E876 Hypokalemia: Secondary | ICD-10-CM | POA: Diagnosis present

## 2012-04-04 DIAGNOSIS — E1165 Type 2 diabetes mellitus with hyperglycemia: Secondary | ICD-10-CM

## 2012-04-04 DIAGNOSIS — R739 Hyperglycemia, unspecified: Secondary | ICD-10-CM

## 2012-04-04 DIAGNOSIS — N390 Urinary tract infection, site not specified: Secondary | ICD-10-CM

## 2012-04-04 DIAGNOSIS — F3289 Other specified depressive episodes: Secondary | ICD-10-CM | POA: Diagnosis present

## 2012-04-04 DIAGNOSIS — J309 Allergic rhinitis, unspecified: Secondary | ICD-10-CM

## 2012-04-04 DIAGNOSIS — R609 Edema, unspecified: Secondary | ICD-10-CM

## 2012-04-04 DIAGNOSIS — Z79899 Other long term (current) drug therapy: Secondary | ICD-10-CM

## 2012-04-04 DIAGNOSIS — R079 Chest pain, unspecified: Secondary | ICD-10-CM | POA: Diagnosis present

## 2012-04-04 DIAGNOSIS — R05 Cough: Secondary | ICD-10-CM

## 2012-04-04 DIAGNOSIS — F29 Unspecified psychosis not due to a substance or known physiological condition: Secondary | ICD-10-CM | POA: Diagnosis present

## 2012-04-04 DIAGNOSIS — Z9119 Patient's noncompliance with other medical treatment and regimen: Secondary | ICD-10-CM

## 2012-04-04 LAB — BASIC METABOLIC PANEL
BUN: 19 mg/dL (ref 6–23)
Chloride: 95 mEq/L — ABNORMAL LOW (ref 96–112)
GFR calc Af Amer: >90 mL/min (ref >90)
GFR calc non Af Amer: >90 mL/min (ref >90)
Potassium: 4.1 mEq/L (ref 3.5–5.1)
Sodium: 131 mEq/L — ABNORMAL LOW (ref 135–145)

## 2012-04-04 LAB — CBC
MCHC: 34.3 g/dL (ref 30.0–36.0)
Platelets: 290 10*3/uL (ref 150–400)
RDW: 13.5 % (ref 11.5–15.5)
WBC: 4.6 10*3/uL (ref 4.0–10.5)

## 2012-04-04 LAB — TROPONIN I: Troponin I: <0.3 ng/mL (ref <0.30)

## 2012-04-04 NOTE — ED Notes (Signed)
Pt c/o chest pain onset yesterday. EMS called to house, pt refused EMS transport, wanting family to escort pt. EMS reported hypertension upon assessment. EMS recommended pt some to ED for evaluation. Pt is a poor historian due to a head injury from a fall a year ago.

## 2012-04-05 ENCOUNTER — Encounter (HOSPITAL_COMMUNITY): Payer: Self-pay

## 2012-04-05 ENCOUNTER — Inpatient Hospital Stay (HOSPITAL_COMMUNITY): Payer: Medicare Other

## 2012-04-05 DIAGNOSIS — I10 Essential (primary) hypertension: Secondary | ICD-10-CM

## 2012-04-05 DIAGNOSIS — R7309 Other abnormal glucose: Secondary | ICD-10-CM

## 2012-04-05 DIAGNOSIS — F29 Unspecified psychosis not due to a substance or known physiological condition: Secondary | ICD-10-CM

## 2012-04-05 DIAGNOSIS — R079 Chest pain, unspecified: Secondary | ICD-10-CM | POA: Diagnosis present

## 2012-04-05 DIAGNOSIS — R410 Disorientation, unspecified: Secondary | ICD-10-CM | POA: Diagnosis present

## 2012-04-05 LAB — URINALYSIS, ROUTINE W REFLEX MICROSCOPIC
Bilirubin Urine: NEGATIVE
Glucose, UA: >1000 mg/dL — AB
Hgb urine dipstick: NEGATIVE
Ketones, ur: 15 mg/dL — AB
pH: 5.5 (ref 5.0–8.0)

## 2012-04-05 LAB — BASIC METABOLIC PANEL
BUN: 13 mg/dL (ref 6–23)
Chloride: 106 mEq/L (ref 96–112)
Creatinine, Ser: 0.43 mg/dL — ABNORMAL LOW (ref 0.50–1.10)
GFR calc Af Amer: >90 mL/min (ref >90)
Glucose, Bld: 80 mg/dL (ref 70–99)
Potassium: 3.3 mEq/L — ABNORMAL LOW (ref 3.5–5.1)

## 2012-04-05 LAB — HEPATIC FUNCTION PANEL
ALT: 12 U/L (ref 0–35)
Alkaline Phosphatase: 53 U/L (ref 39–117)
Bilirubin, Direct: 0.2 mg/dL (ref 0.0–0.3)
Indirect Bilirubin: 0.7 mg/dL (ref 0.3–0.9)
Total Bilirubin: 0.9 mg/dL (ref 0.3–1.2)

## 2012-04-05 LAB — GLUCOSE, CAPILLARY
Glucose-Capillary: 312 mg/dL — ABNORMAL HIGH (ref 70–99)
Glucose-Capillary: 272 mg/dL — ABNORMAL HIGH (ref 70–99)
Glucose-Capillary: 255 mg/dL — ABNORMAL HIGH (ref 70–99)

## 2012-04-05 LAB — LIPID PANEL
Cholesterol: 247 mg/dL — ABNORMAL HIGH (ref 0–200)
LDL Cholesterol: 136 mg/dL — ABNORMAL HIGH (ref 0–99)
Total CHOL/HDL Ratio: 2.4 RATIO
Triglycerides: 39 mg/dL (ref <150)
VLDL: 8 mg/dL (ref 0–40)

## 2012-04-05 LAB — RPR: RPR Ser Ql: NONREACTIVE

## 2012-04-05 LAB — TROPONIN I: Troponin I: <0.3 ng/mL (ref <0.30)

## 2012-04-05 LAB — URINE MICROSCOPIC-ADD ON

## 2012-04-05 MED ORDER — SODIUM CHLORIDE 0.9 % IV SOLN
INTRAVENOUS | Status: DC
  Administered 2012-04-05 – 2012-04-06 (×3): via INTRAVENOUS

## 2012-04-05 MED ORDER — POTASSIUM CHLORIDE CRYS ER 20 MEQ PO TBCR
40.0000 meq | EXTENDED_RELEASE_TABLET | Freq: Once | ORAL | Status: AC
  Administered 2012-04-05: 40 meq via ORAL
  Filled 2012-04-05: qty 2

## 2012-04-05 MED ORDER — DEXTROSE 5 % IV SOLN: 1.0000 g | Freq: Once | INTRAVENOUS | Status: DC

## 2012-04-05 MED ORDER — SODIUM CHLORIDE 0.9 % IV SOLN: INTRAVENOUS | Status: DC

## 2012-04-05 MED ORDER — SODIUM CHLORIDE 0.9 % IV SOLN: 1000.0000 mL | Freq: Once | INTRAVENOUS | Status: AC

## 2012-04-05 MED ORDER — ENOXAPARIN SODIUM 40 MG/0.4ML ~~LOC~~ SOLN
40.0000 mg | SUBCUTANEOUS | Status: DC
  Administered 2012-04-05 – 2012-04-07 (×3): 40 mg via SUBCUTANEOUS
  Filled 2012-04-05 (×3): qty 0.4

## 2012-04-05 MED ORDER — SODIUM CHLORIDE 0.9 % IV SOLN
INTRAVENOUS | Status: DC
  Administered 2012-04-05: 2.5 [IU]/h via INTRAVENOUS
  Administered 2012-04-05: 5.1 [IU]/h via INTRAVENOUS
  Filled 2012-04-05: qty 1

## 2012-04-05 MED ORDER — SODIUM CHLORIDE 0.9 % IJ SOLN
3.0000 mL | Freq: Two times a day (BID) | INTRAMUSCULAR | Status: DC
  Administered 2012-04-05: 3 mL via INTRAVENOUS

## 2012-04-05 MED ORDER — INSULIN ASPART 100 UNIT/ML ~~LOC~~ SOLN
0.0000 [IU] | Freq: Three times a day (TID) | SUBCUTANEOUS | Status: DC
  Administered 2012-04-05: 3 [IU] via SUBCUTANEOUS
  Administered 2012-04-05: 8 [IU] via SUBCUTANEOUS
  Administered 2012-04-06: 3 [IU] via SUBCUTANEOUS

## 2012-04-05 MED ORDER — DEXTROSE 5 % IV SOLN
1.0000 g | INTRAVENOUS | Status: DC
  Administered 2012-04-05 – 2012-04-07 (×3): 1 g via INTRAVENOUS
  Filled 2012-04-05 (×3): qty 10

## 2012-04-05 MED ORDER — SODIUM CHLORIDE 0.9 % IV SOLN
1000.0000 mL | Freq: Once | INTRAVENOUS | Status: AC
  Administered 2012-04-05: 1000 mL via INTRAVENOUS

## 2012-04-05 MED ORDER — INFLUENZA VIRUS VACC SPLIT PF IM SUSP
0.5000 mL | Freq: Once | INTRAMUSCULAR | Status: AC
  Administered 2012-04-05: 0.5 mL via INTRAMUSCULAR
  Filled 2012-04-05 (×2): qty 0.5

## 2012-04-05 MED ORDER — INSULIN GLARGINE 100 UNIT/ML ~~LOC~~ SOLN
15.0000 [IU] | Freq: Every day | SUBCUTANEOUS | Status: DC
  Administered 2012-04-05: 15 [IU] via SUBCUTANEOUS

## 2012-04-05 MED ORDER — DEXTROSE-NACL 5-0.45 % IV SOLN
INTRAVENOUS | Status: DC
  Administered 2012-04-05: 09:00:00 via INTRAVENOUS

## 2012-04-05 MED ORDER — LORAZEPAM 2 MG/ML IJ SOLN
INTRAMUSCULAR | Status: AC
  Administered 2012-04-05: 0.5 mg
  Filled 2012-04-05: qty 1

## 2012-04-05 MED ORDER — SIMVASTATIN 10 MG PO TABS
10.0000 mg | ORAL_TABLET | Freq: Every day | ORAL | Status: DC
  Administered 2012-04-05 – 2012-04-07 (×2): 10 mg via ORAL
  Filled 2012-04-05 (×3): qty 1

## 2012-04-05 MED ORDER — DEXTROSE 50 % IV SOLN: 25.0000 mL | INTRAVENOUS | Status: DC | PRN

## 2012-04-05 MED ORDER — LORAZEPAM 2 MG/ML IJ SOLN
0.5000 mg | Freq: Once | INTRAMUSCULAR | Status: AC
  Administered 2012-04-05: 0.5 mg via INTRAVENOUS
  Filled 2012-04-05: qty 1

## 2012-04-05 MED ORDER — SODIUM CHLORIDE 0.9 % IV SOLN: 1000.0000 mL | INTRAVENOUS | Status: DC

## 2012-04-05 NOTE — Care Management Note (Signed)
    Page 1 of 2   04/07/2012     3:50:34 PM   CARE MANAGEMENT NOTE 04/07/2012  Patient:  Dana [name], Dana [name]   Account Number:  [id number]  Date Initiated:  04/05/2012  Documentation initiated by:  Lanier Clam  Subjective/Objective Assessment:   ADMITTED W/CHEST PAIN.     Action/Plan:   FROM HOME W/SPOUSE.   Anticipated DC Date:  04/07/2012   Anticipated DC Plan:  HOME W HOME HEALTH SERVICES      DC Planning Services  CM consult      Choice offered to / List presented to:  C-1 Patient   DME arranged  [facility] Hurst      DME agency  [facility]     [facility] arranged  HH-1 RN  HH-2 PT      [facility] agency  [facility]   Status of service:  Completed, signed off Medicare Important Message given?   (If response is "NO", the following Medicare IM given date fields will be blank) Date Medicare IM given:   Date Additional Medicare IM given:    Discharge Disposition:  HOME W HOME HEALTH SERVICES  Per UR Regulation:  Reviewed for med. necessity/level of care/duration of stay  If discussed at Long Length of Stay Meetings, dates discussed:    Comments:  04/07/12 Bennie Scaff RN,BSN NCM [phone] [facility] CHOSEN FOR HH.SUSAN DALE(LIASON) INFORMED OF HH/DME NEEDS & D/C. 04/05/12 Airam Heidecker RN,BSN NCM [phone]

## 2012-04-05 NOTE — ED Notes (Signed)
Pt awake w/ confusion, husband at bedside, pt unable to state where she is or date, pt asks "where am I, how long have I been here", "Is it Sunday?". Pt cooperative. Pt denies pain. Pt asked if took insulin yesterday at home and states "I'm not sure if I took it yesterday but I know I take insulin".

## 2012-04-05 NOTE — ED Notes (Signed)
CBG 187, Insulin gtt changed to 3.8 per glucose stabilizer

## 2012-04-05 NOTE — H&P (Signed)
[facility] History and Physical  Dana [name] WUJ:[id number] DOB: March 04, 1946 DOA: 04/04/2012  Referring physician: Dr. Norlene Campbell  PCP: Romero Belling, MD   Chief Complaint: Chest pain  HPI:  -year-old female with history off type 2 diabetes mellitus (not taking her medications for past 2 years), hypertension, depression, anxiety, allergic rhinitis who was brought in by EMS for her symptoms all chest pain. Patient is a very poor historian and limited history was her husband at bedside. Patient has been noted to be confused for past 2 years and has also stopped her medications since then. She has aloso not been following up with ehr PCP for several months (the last visit being 6-7 months ago as per the husband however in the system her last visit was almost 18 months ago). Patient on questioning is unable to describe the nature of her chest pain and said C. may have had some chest pain but point at her neck. Husband is also unable to describe the nature of the pain any further. That her blood pressure was elevated with systolic of 190s and elevated fingerstick in 500s. Brought to the ED where she was noted to be hypertensive with elevated blood glucose without any anion gap. An EKG was obtained which was unremarkable. Initial set of troponin was negative as well. [facility] called in for admission under observation for her chest pain symptoms.  Patient on evaluation this morning was suddenly confused not knowing where she was but slowly recalled that EMS brought her to ED because she needed help with her glucose and on insisting by the husband says she had some chest pain. She is confused with the  month and date  but knows where she lives and remembers her PCP. The chest pain at this time, denies palpitations, shortness of breath, abdominal pain, headache, nausea, vomiting, blurry vision, tingling or numbness in her extremities, bowel or urinary symptoms. As per husband she lives at home and  does not leave the house by herself and has stopped driving for past one year be confused.  Review of Systems:  As outlined in HPI. ROS limited due to patient's participation.    Past Medical History  Diagnosis Date  . ALLERGIC RHINITIS 02/08/2007  . ANXIETY 02/08/2007  . Cough 08/15/2008  . DEPRESSION 02/08/2007  . DIABETES MELLITUS, TYPE I 02/08/2007  . Headache 11/06/2008  . HYPERCHOLESTEROLEMIA 11/05/2008  . HYPERTENSION 02/08/2007  . URINARY INCONTINENCE 02/08/2007  . Dyslipidemia   . Non-ulcer dyspepsia   . Leukopenia   . DM retinopathy     background  . OA (osteoarthritis) 1990    disabled due to OA   Past Surgical History  Procedure Date  . Total knee arthroplasty 1990    left  . Panendoscopy 10/27/1999  . Stress cardiolite 12/14/2003  . Electrocardiogram 05/17/2006   Social History:  reports that she has never smoked. She does not have any smokeless tobacco history on file. Her alcohol and drug histories not on file.  Allergies  Allergen Reactions  . Aspirin   . Codeine     REACTION: nausea    Family History  Problem Relation Age of Onset  . Alcohol abuse Son   . Cancer Neg Hx     no cancer in immediate family    Prior to Admission medications   Not on File    Physical Exam:  Filed Vitals:   04/05/12 0500 04/05/12 0530 04/05/12 0600 04/05/12 0620  BP: 130/70 113/58 115/59   Pulse:  89  Temp:      TempSrc:      Resp:    19  Height:      Weight:      SpO2:    98%    Constitutional: Vital signs reviewed. Thin built female lying in bed with poor eye contact  Head: Normocephalic and atraumatic Ear: TM normal bilaterally Mouth: no erythema or exudates, MMM Eyes: PERRL, EOMI, conjunctivae normal, No scleral icterus.  Neck: Supple, Trachea midline normal ROM, No JVD, mass, thyromegaly, or carotid bruit present.  Cardiovascular: RRR, S1 normal, S2 normal, no MRG, pulses symmetric and intact bilaterally Pulmonary/Chest: CTAB, no wheezes, rales, or  rhonchi Abdominal: Soft. Non-tender, non-distended, bowel sounds are normal, no masses, organomegaly, or guarding present.  GU: no CVA tenderness Musculoskeletal: No joint deformities, erythema, or stiffness, ROM full and no nontender Ext: no edema and no cyanosis, pulses palpable bilaterally (DP and PT) Hematology: no cervical, inginal, or axillary adenopathy.  Neurological: A&O x2, Strenght is normal and symmetric bilaterally, cranial nerve II-XII are grossly intact, no focal motor deficit, sensory intact to light touch bilaterally.  Skin: Warm, dry and intact. No rash, cyanosis, or clubbing.  Psychiatric: Normal mood and affect. speech and behavior is normal. Judgment and thought content normal. Cognition and memory are normal.   Labs on Admission:  Basic Metabolic Panel:  Lab 04/04/12 1610  NA 131*  K 4.1  CL 95*  CO2 26  GLUCOSE 519*  BUN 19  CREATININE 0.54  CALCIUM 9.5  MG --  PHOS --   Liver Function Tests: No results found for this basename: AST:5,ALT:5,ALKPHOS:5,BILITOT:5,PROT:5,ALBUMIN:5 in the last 168 hours No results found for this basename: LIPASE:5,AMYLASE:5 in the last 168 hours No results found for this basename: AMMONIA:5 in the last 168 hours CBC:  Lab 04/04/12 1955  WBC 4.6  NEUTROABS --  HGB 13.4  HCT 39.1  MCV 82.0  PLT 290   Cardiac Enzymes:  Lab 04/04/12 1955  CKTOTAL --  CKMB --  CKMBINDEX --  TROPONINI <0.30   BNP: No components found with this basename: POCBNP:5 CBG:  Lab 04/05/12 0750 04/05/12 0643 04/05/12 0535 04/05/12 0403  GLUCAP 187* 272* 313* 312*    Radiological Exams on Admission: Dg Chest 2 View  04/04/2012  *RADIOLOGY REPORT*  Clinical Data: Chest pain and shortness of breath.  CHEST - 2 VIEW  Comparison: 08/15/2008  Findings: The heart size and pulmonary vascularity are normal. There is chronic peribronchial thickening and coronary artery calcification.  No acute infiltrates or effusions.  No acute osseous abnormality.   IMPRESSION: No acute abnormalities.  Chronic bronchitic changes.  Coronary artery calcifications.   Original Report Authenticated By: Gwynn Burly, M.D.     EKG: NSR no ST-T changes  Assessment/Plan  Principal Problem:  *Chest pain History very unreliable  will admit to telemetry  r/o ACS with serial CE and EKG. She does have risk factors for CAD. Given unclear history i will hold off on any further tests except for serial and EKG.  -If reliable for outpatient follow up further cardiac w/up like 2D Echo and stress test can be done. -check lipid panel. Patient allergic to ASA  Active Problems:  Uncontrolled diabetes mellitus fsg now more stable. As per outpatient visit note from 2011, she was on novolog and metformin at home which she has not been taking for almost 2 years  monitor fsg and place on SSI for now  check A1C  diabetic counselor consult   dyslipidemia  Not o any meds  check lipid panel   confusion Ongoing for almost 2 years with component of dementia  will check head CT, B12, TSH and RPR UA does suggest UTI and had low grade temp. Will treat with IV ceftriaxone. Follow urine cx   Depression  was on trazodone before. Not taking meds now   Hyypertension Not taking meds. Will place on norvasc.  Dies; diabetic  DVT prophylaxis: SQ lovenox  Code Status: full code Family Communication: discussed with husband at bedside Disposition Plan: home once stable  Eddie North [facility] Pager [phone]  If 7PM-7AM, please contact night-coverage [email or web address] Password [facility] 04/05/2012, 8:26 AM     Total time spent: 70 minutes

## 2012-04-05 NOTE — ED Provider Notes (Signed)
History     CSN: [id number]  Arrival date & time 04/04/12  1924   First MD Initiated Contact with Patient 04/05/12 0014      Chief Complaint  Patient presents with  . Chest Pain  . Hypertension    (Consider location/radiation/quality/duration/timing/severity/associated sxs/prior treatment) HPI 66 year old female presents emergency department from home with family with report of chest pain.  Pt with h/o htn, dm, hyperlipidemia.  She had not taken any medications in over a year.  Pt reports she can't afford the medications.  Family has not been able to get her to go to the doctor in over a year. Patient with some chest pain last week, then again yesterday morning. Patient reports chest pain was substernal nonradiating, no nausea no diaphoresis or shortness of breath associated with the pain. Pain lasted approximately an hour. Husband called 911 secondary to pain, the patient refused transport. Patient reports she has been dizzy over the last month, intermittently had some numbness to her left hand. Patient is not checked her sugar and some time. Family reports patient had head trauma about a year ago while at a funeral, was seen at local ER and diagnosed with a concussion. They feel since that time she has been persistently more confused, agitated at times, and does not remember day-to-day events well.  Past Medical History  Diagnosis Date  . ALLERGIC RHINITIS 02/08/2007  . ANXIETY 02/08/2007  . Cough 08/15/2008  . DEPRESSION 02/08/2007  . DIABETES MELLITUS, TYPE I 02/08/2007  . Headache 11/06/2008  . HYPERCHOLESTEROLEMIA 11/05/2008  . HYPERTENSION 02/08/2007  . URINARY INCONTINENCE 02/08/2007  . Dyslipidemia   . Non-ulcer dyspepsia   . Leukopenia   . DM retinopathy     background  . OA (osteoarthritis) 1990    disabled due to OA    Past Surgical History  Procedure Date  . Total knee arthroplasty 1990    left  . Panendoscopy 10/27/1999  . Stress cardiolite 12/14/2003  .  Electrocardiogram 05/17/2006    Family History  Problem Relation Age of Onset  . Alcohol abuse Son   . Cancer Neg Hx     no cancer in immediate family    History  Substance Use Topics  . Smoking status: Never Smoker   . Smokeless tobacco: Not on file  . Alcohol Use: Not on file    OB History    Grav Para Term Preterm Abortions TAB SAB Ect Mult Living                  Review of Systems  Unable to perform ROS: Dementia    Allergies  Aspirin and Codeine  Home Medications  No current outpatient prescriptions on file.  BP 115/59  Pulse 89  Temp 99.2 F (37.3 C) (Oral)  Resp 19  Ht 5\' 3"  (1.6 m)  Wt 126 lb 6.4 oz (57.335 kg)  BMI 22.39 kg/m2  SpO2 98%  Physical Exam  Nursing note and vitals reviewed. Constitutional: She is oriented to person, place, and time. She appears well-developed and well-nourished. She appears distressed (Pt is agitated at times, argument with her family about details of her medical history).  HENT:  Head: Normocephalic and atraumatic.  Nose: Nose normal.  Mouth/Throat: Oropharynx is clear and moist.  Eyes: Conjunctivae normal and EOM are normal. Pupils are equal, round, and reactive to light.  Neck: Normal range of motion. Neck supple. No JVD present. No tracheal deviation present. No thyromegaly present.  Cardiovascular: Normal rate, regular rhythm, normal  heart sounds and intact distal pulses.  Exam reveals no gallop and no friction rub.   No murmur heard. Pulmonary/Chest: Effort normal and breath sounds normal. No stridor. No respiratory distress. She has no wheezes. She has no rales. She exhibits no tenderness.  Abdominal: Soft. Bowel sounds are normal. She exhibits no distension and no mass. There is no tenderness. There is no rebound and no guarding.  Musculoskeletal: Normal range of motion. She exhibits no edema and no tenderness.  Lymphadenopathy:    She has no cervical adenopathy.  Neurological: She is alert and oriented to  person, place, and time. No cranial nerve deficit. She exhibits normal muscle tone. Coordination normal.  Skin: Skin is dry. No rash noted. No erythema. No pallor.  Psychiatric: She has a normal mood and affect. Her behavior is normal. Judgment and thought content normal.    ED Course  Procedures (including critical care time)  Labs Reviewed  BASIC METABOLIC PANEL - Abnormal; Notable for the following:    Sodium 131 (*)     Chloride 95 (*)     Glucose, Bld 519 (*)     All other components within normal limits  URINALYSIS, ROUTINE W REFLEX MICROSCOPIC - Abnormal; Notable for the following:    Specific Gravity, Urine 1.041 (*)     Glucose, UA >1000 (*)     Ketones, ur 15 (*)     Leukocytes, UA TRACE (*)     All other components within normal limits  URINE MICROSCOPIC-ADD ON - Abnormal; Notable for the following:    Bacteria, UA FEW (*)     All other components within normal limits  GLUCOSE, CAPILLARY - Abnormal; Notable for the following:    Glucose-Capillary 312 (*)     All other components within normal limits  GLUCOSE, CAPILLARY - Abnormal; Notable for the following:    Glucose-Capillary 313 (*)     All other components within normal limits  CBC  TROPONIN I  TROPONIN I  GLUCOSE, CAPILLARY   Dg Chest 2 View  04/04/2012  *RADIOLOGY REPORT*  Clinical Data: Chest pain and shortness of breath.  CHEST - 2 VIEW  Comparison: 08/15/2008  Findings: The heart size and pulmonary vascularity are normal. There is chronic peribronchial thickening and coronary artery calcification.  No acute infiltrates or effusions.  No acute osseous abnormality.  IMPRESSION: No acute abnormalities.  Chronic bronchitic changes.  Coronary artery calcifications.   Original Report Authenticated By: Gwynn Burly, M.D.     Date: 04/04/2012  Rate: 86  Rhythm: sinus arrhythmia  QRS Axis: normal  Intervals: normal  ST/T Wave abnormalities: normal  Conduction Disutrbances:none  Narrative Interpretation:    Old EKG Reviewed: unchanged    1. Chest pain   2. Hyperglycemia   3. Noncompliance   4. Hypertension   5. Urinary tract infection   6. Confusion       MDM  66 yo female with medical noncompliance who presents with chest pain. EKG without changes. Patient noted be hyper glycemic without DKA. It appears patient may have some underlying dementia which has not yet been diagnosed. Patient with multiple risk factors for coronary disease/ACS. Plan for admission to the hospital. Patient is agreeable to this plan, although is upset that her family "trick her into admission" discuss with hospitalist who requests telemetry bed and temporary admission orders.          Olivia Mackie, MD 04/05/12 [phone]

## 2012-04-05 NOTE — ED Notes (Signed)
Changed pt bed linen and gown. Pt was soaked in urine. Pt very confused to date and time.

## 2012-04-05 NOTE — Progress Notes (Signed)
Pt has has 4 consecutive CBG in parameters, MD aware and glucomander stopped per protocol, no lantus orders given, pt ate lunch and was covered w/ SSI as ordered.

## 2012-04-06 DIAGNOSIS — F411 Generalized anxiety disorder: Secondary | ICD-10-CM

## 2012-04-06 DIAGNOSIS — E876 Hypokalemia: Secondary | ICD-10-CM | POA: Diagnosis present

## 2012-04-06 DIAGNOSIS — IMO0001 Reserved for inherently not codable concepts without codable children: Secondary | ICD-10-CM

## 2012-04-06 MED ORDER — INSULIN ASPART 100 UNIT/ML ~~LOC~~ SOLN
3.0000 [IU] | Freq: Three times a day (TID) | SUBCUTANEOUS | Status: DC
  Administered 2012-04-06 – 2012-04-07 (×4): 3 [IU] via SUBCUTANEOUS

## 2012-04-06 MED ORDER — INSULIN NPH (HUMAN) (ISOPHANE) 100 UNIT/ML ~~LOC~~ SUSP
8.0000 [IU] | Freq: Two times a day (BID) | SUBCUTANEOUS | Status: DC
  Administered 2012-04-06 – 2012-04-07 (×2): 8 [IU] via SUBCUTANEOUS
  Filled 2012-04-06: qty 10

## 2012-04-06 MED ORDER — POTASSIUM CHLORIDE CRYS ER 20 MEQ PO TBCR
40.0000 meq | EXTENDED_RELEASE_TABLET | Freq: Once | ORAL | Status: AC
  Administered 2012-04-06: 40 meq via ORAL
  Filled 2012-04-06: qty 2

## 2012-04-06 MED ORDER — INSULIN ASPART 100 UNIT/ML ~~LOC~~ SOLN
0.0000 [IU] | Freq: Three times a day (TID) | SUBCUTANEOUS | Status: DC
  Administered 2012-04-06 – 2012-04-07 (×2): 5 [IU] via SUBCUTANEOUS
  Administered 2012-04-07: 3 [IU] via SUBCUTANEOUS

## 2012-04-06 MED ORDER — INSULIN ASPART 100 UNIT/ML ~~LOC~~ SOLN
0.0000 [IU] | Freq: Every day | SUBCUTANEOUS | Status: DC
  Administered 2012-04-06: 2 [IU] via SUBCUTANEOUS

## 2012-04-06 MED ORDER — HALOPERIDOL 2 MG PO TABS
2.0000 mg | ORAL_TABLET | Freq: Four times a day (QID) | ORAL | Status: DC | PRN
  Filled 2012-04-06 (×3): qty 1

## 2012-04-06 NOTE — Progress Notes (Addendum)
[facility] PROGRESS NOTE  Dana [name] NWG:[id number] DOB: [birth year] DOA: 04/04/2012 PCP: Romero Belling, MD  Assessment/Plan: Principal Problem:  *Chest pain Active Problems:  HYPERCHOLESTEROLEMIA  DEPRESSION  HYPERTENSION  Uncontrolled diabetes mellitus  Confusion  1. Chest pain- EKG and tele normal, CE WNL.  Think from anxiety about coming to the hospital 2. Paranoia with some dementia- patient has quit taking her medications, family relays she hides her checks and will not cash them, she spends all her money when she goes out.  She thinks her family tricked her into coming into the ER.  Consult psych.  TSH ok 3. HTN- home meds 4. HLD 5. Uncontrolled DM- NPH BID with SSI and 3 units with meals, appreciate diabetic coordinator's help    Code Status: full Family Communication:  Disposition Plan: ?     Consultants:  psych   HPI/Subjective: Says her family tricked her into coming here No CP, no fever, no chills Anxious about being here    Objective: Filed Vitals:   04/05/12 1440 04/05/12 2132 04/06/12 0500 04/06/12 0524  BP: 139/75 126/68  152/94  Pulse: 87 85  78  Temp: 98.5 F (36.9 C) 98.6 F (37 C)  97.6 F (36.4 C)  TempSrc: Oral Oral  Oral  Resp: 18 18  20   Height:      Weight:   60.419 kg (133 lb 3.2 oz)   SpO2: 100% 100%  100%    Intake/Output Summary (Last 24 hours) at 04/06/12 1306 Last data filed at 04/06/12 0700  Gross per 24 hour  Intake 2444.58 ml  Output   1050 ml  Net 1394.58 ml   Filed Weights   04/04/12 1941 04/05/12 1047 04/06/12 0500  Weight: 57.335 kg (126 lb 6.4 oz) 57.561 kg (126 lb 14.4 oz) 60.419 kg (133 lb 3.2 oz)    Exam:   General:  Pleasant/cooperative- oriented to person/place- some odd responses to questions  Cardiovascular: rrr  Respiratory: clear anterior  Abdomen: +BS, soft, NT/ND  Data Reviewed: Basic Metabolic Panel:  Lab 04/05/12 5784 04/04/12 1955  NA 141 131*  K 3.3* 4.1  CL 106 95*    CO2 26 26  GLUCOSE 80 519*  BUN 13 19  CREATININE 0.43* 0.54  CALCIUM 9.2 9.5  MG -- --  PHOS -- --   Liver Function Tests:  Lab 04/05/12 0930  AST 12  ALT 12  ALKPHOS 53  BILITOT 0.9  PROT 6.5  ALBUMIN 3.4*   No results found for this basename: LIPASE:5,AMYLASE:5 in the last 168 hours No results found for this basename: AMMONIA:5 in the last 168 hours CBC:  Lab 04/04/12 1955  WBC 4.6  NEUTROABS --  HGB 13.4  HCT 39.1  MCV 82.0  PLT 290   Cardiac Enzymes:  Lab 04/05/12 1250 04/05/12 0930 04/04/12 1955  CKTOTAL -- -- --  CKMB -- -- --  CKMBINDEX -- -- --  TROPONINI <0.30 <0.30 <0.30   BNP (last 3 results) No results found for this basename: PROBNP:3 in the last 8760 hours CBG:  Lab 04/06/12 1212 04/06/12 0730 04/05/12 2130 04/05/12 1807 04/05/12 1229  GLUCAP 248* 208* 262* 255* 174*    No results found for this or any previous visit (from the past 240 hour(s)).   Studies: Dg Chest 2 View  04/04/2012  *RADIOLOGY REPORT*  Clinical Data: Chest pain and shortness of breath.  CHEST - 2 VIEW  Comparison: 08/15/2008  Findings: The heart size and pulmonary vascularity are normal. There  is chronic peribronchial thickening and coronary artery calcification.  No acute infiltrates or effusions.  No acute osseous abnormality.  IMPRESSION: No acute abnormalities.  Chronic bronchitic changes.  Coronary artery calcifications.   Original Report Authenticated By: Gwynn Burly, M.D.    Ct Head Wo Contrast  04/05/2012  *RADIOLOGY REPORT*  Clinical Data: Altered mental status.  Dizziness.  CT HEAD WITHOUT CONTRAST  Technique:  Contiguous axial images were obtained from the base of the skull through the vertex without contrast.  Comparison: 06/02/2008.  Findings: Mild generalized atrophy and subcortical white matter hypoattenuation is similar to the prior exam.  No acute cortical infarct, hemorrhage, mass lesion is present.  The ventricles are of normal size.  No significant  extra-axial fluid collection is present.  Vascular calcifications present within the cavernous carotid arteries and at the dural margin of the vertebral arteries bilaterally.  The paranasal sinuses and mastoid air cells are clear.  The osseous skull is intact.  IMPRESSION:  1.  No acute intracranial abnormality or significant interval change. 2.  Stable mild atrophy and white matter disease, potentially within normal limits for age. 3.  Atherosclerosis.   Original Report Authenticated By: Jamesetta Orleans. MATTERN, M.D.     Scheduled Meds:   . cefTRIAXone (ROCEPHIN)  IV  1 g Intravenous Q24H  . enoxaparin (LOVENOX) injection  40 mg Subcutaneous Q24H  . influenza  inactive virus vaccine  0.5 mL Intramuscular Once  . insulin aspart  0-15 Units Subcutaneous TID WC  . insulin aspart  0-5 Units Subcutaneous QHS  . insulin glargine  15 Units Subcutaneous QHS  . LORazepam  0.5 mg Intravenous Once  . simvastatin  10 mg Oral q1800  . sodium chloride  3 mL Intravenous Q12H  . DISCONTD: insulin aspart  0-15 Units Subcutaneous TID WC   Continuous Infusions:   . sodium chloride 100 mL/hr at 04/05/12 2207  . DISCONTD: sodium chloride    . DISCONTD: sodium chloride    . DISCONTD: sodium chloride      Principal Problem:  *Chest pain Active Problems:  HYPERCHOLESTEROLEMIA  DEPRESSION  HYPERTENSION  Uncontrolled diabetes mellitus  Confusion    Time spent: 35 min    Marlin Canary  [facility] Pager [phone] 04/06/2012, 1:06 PM  LOS: 2 days       I do not think patient has capacity to make decision to leave AMA.  Await psych eval. Marlin Canary

## 2012-04-06 NOTE — Evaluation (Addendum)
Physical Therapy Evaluation Patient Details Name: Dana [name] MRN: [id number] DOB: September 29, 1945 Today's Date: 04/06/2012 Time: 4401-0272 PT Time Calculation (min): 23 min  PT Assessment / Plan / Recommendation Clinical Impression  Pt presented with chest pain with history of DMII, HTN and dementia/paranoia.  Tolerated OOB and ambulation in hallway with RW, however pt somewhat impulsive with all mobility and per RN was unsteady without AD.  Pt will  benefit from skilled PT in acute venue to address deficits.  PT recommends HHPT for follow up as well as 24/7 supervision/assist due to dementia/confusion.      PT Assessment  Patient needs continued PT services    Follow Up Recommendations  Home health PT; 24/7 Supervision/Assist    Barriers to Discharge Decreased caregiver support Pt states that husband is only there "most " of time.     Equipment Recommendations  Rolling walker with 5" wheels;Cane (TBD)    Recommendations for Other Services     Frequency Min 3X/week    Precautions / Restrictions Precautions Precautions: Fall Restrictions Weight Bearing Restrictions: No   Pertinent Vitals/Pain No pain      Mobility  Bed Mobility Bed Mobility: Supine to Sit Supine to Sit: 5: Supervision Details for Bed Mobility Assistance: Supervision and cues for safety.  Noted pt to be impulsive with all mobility with max cuing for safety.  Transfers Transfers: Sit to Stand;Stand to Sit Sit to Stand: 5: Supervision;From bed Stand to Sit: 5: Supervision;To chair/3-in-1 Details for Transfer Assistance: Supervision and max cues for safety and hand placement.  Ambulation/Gait Ambulation/Gait Assistance: 4: Min guard Ambulation Distance (Feet): 200 Feet Assistive device: Rolling walker Ambulation/Gait Assistance Details: Per nursing, pt unsteady without AD, therefore ambulated with RW.  Pt with increased stability with cuing for sequencing/technique and positioning inside of RW. Noted pt to  veer to left and bump into objects with cuing and some assist to correct.  Gait Pattern: Step-through pattern Stairs: No Wheelchair Mobility Wheelchair Mobility: No    Exercises     PT Diagnosis: Difficulty walking;Generalized weakness  PT Problem List: Decreased strength;Decreased balance;Decreased mobility;Decreased cognition;Decreased knowledge of use of DME;Decreased safety awareness PT Treatment Interventions: DME instruction;Gait training;Stair training;Functional mobility training;Therapeutic activities;Therapeutic exercise;Balance training;Patient/family education   PT Goals Acute Rehab PT Goals PT Goal Formulation: With patient Time For Goal Achievement: 04/13/12 Potential to Achieve Goals: Good Pt will Ambulate: >150 feet;with modified independence;with least restrictive assistive device PT Goal: Ambulate - Progress: Goal set today Pt will Go Up / Down Stairs: 3-5 stairs;with rail(s);with supervision PT Goal: Up/Down Stairs - Progress: Goal set today Pt will Perform Home Exercise Program: with supervision, verbal cues required/provided PT Goal: Perform Home Exercise Program - Progress: Goal set today  Visit Information  Last PT Received On: 04/06/12 Assistance Needed: +1    Subjective Data  Subjective: I'm a patient in the hospital? Patient Stated Goal: n/a   Prior Functioning  Home Living Lives With: Significant other Available Help at Discharge: Family;Available PRN/intermittently Type of Home: House Home Access: Stairs to enter [employer] of Steps: 3 Entrance Stairs-Rails: Can reach both Home Layout: Two level;Able to live on main level with bedroom/bathroom Bathroom Shower/Tub: Engineer, manufacturing systems: Standard Additional Comments: Per pt, lives with husband, he is there most of the time and she was not using any AD to ambulate.  Prior Function Level of Independence: Independent Able to Take Stairs?: Yes Driving: No Vocation:  Retired Comments: All PLOF is per pt, however this is history of dementia  and unsure if information is completely acurate.   Communication Communication: No difficulties    Cognition  Overall Cognitive Status: No family/caregiver present to determine baseline cognitive functioning Arousal/Alertness: Awake/alert Orientation Level: Disoriented to;Place;Time;Situation Behavior During Session: [facility] for tasks performed    Extremity/Trunk Assessment Right Lower Extremity Assessment RLE ROM/Strength/Tone: WFL for tasks assessed RLE Sensation: WFL - Light Touch Left Lower Extremity Assessment LLE ROM/Strength/Tone: WFL for tasks assessed LLE Sensation: WFL - Light Touch Trunk Assessment Trunk Assessment: Normal   Balance    End of Session PT - End of Session Equipment Utilized During Treatment: Gait belt Activity Tolerance: Patient tolerated treatment well Patient left: in chair;with call bell/phone within reach;with chair alarm set;with family/visitor present Nurse Communication: Mobility status  GP     Page, Meribeth Mattes 04/06/2012, 5:17 PM

## 2012-04-06 NOTE — Clinical Documentation Improvement (Signed)
GENERIC DOCUMENTATION CLARIFICATION QUERY  THIS DOCUMENT IS NOT A PERMANENT PART OF THE MEDICAL RECORD  TO RESPOND TO THE THIS QUERY, FOLLOW THE INSTRUCTIONS BELOW:  1. If needed, update documentation for the patient's encounter via the notes activity.  2. Access this query again and click edit on the In [employer].  3. After updating, or not, click F2 to complete all highlighted (required) fields concerning your review. Select "additional documentation in the medical record" OR "no additional documentation provided".  4. Click Sign note button.  5. The deficiency will fall out of your In Basket *Please let us know if you are not able to complete this workflow by phone or e-mail (listed below).  Please update your documentation within the medical record to reflect your response to this query.                                                                                        04/06/12   Dear Dr. Benjamine Mola, [facility],  In a better effort to capture your patient's severity of illness, reflect appropriate length of stay and utilization of resources, a review of the patient medical record has revealed the following indicators.    Based on your clinical judgment, please clarify and document in a progress note and/or discharge summary the clinical condition associated with the following supporting information:  In responding to this query please exercise your independent judgment.  The fact that a query is asked, does not imply that any particular answer is desired or expected.  Pt with CP to R/O ACS.  Please clarify if CP in setting of documented CAD can be further specified as one of the diagnoses listed below and document in the pn or d/c summary.     Possible Clinical Conditions?  Unstable Angina w/ CAD Unstable Angina Coronary Artery Disease ____________________ _______Other Condition__________________ _______Cannot Clinically Determine   Supporting Information:  Risk  Factors: CP, R/O ACS, CKD, Dyslipid, HTN, UTI,   Signs & Symptoms:  Diagnostics:  Treatment Zocor  You may use possible, probable, or suspect with inpatient documentation. possible, probable, suspected diagnoses MUST be documented at the time of discharge  Reviewed:  no additional documentation provided- CP was anxiety- see note  Thank You,  Enis Slipper RN, BSN, MSN/Inf, CCDS Clinical Documentation Specialist [facility] Dept Pager: [phone] / E-mail: [email or web address]  Health Information Management [place]

## 2012-04-06 NOTE — Progress Notes (Signed)
Inpatient Diabetes Program Recommendations  AACE/ADA: New Consensus Statement on Inpatient Glycemic Control (2013)  Target Ranges:  Prepandial:   less than 140 mg/dL      Peak postprandial:   less than 180 mg/dL (1-2 hours)      Critically ill patients:  140 - 180 mg/dL   Reason for Visit: Consult - Hyperglycemia  66 year-old female with history off type 2 diabetes mellitus (not taking her medications for past 2 years), hypertension, depression, anxiety, allergic rhinitis who was brought in by EMS for her symptoms all chest pain. Patient is a very poor historian and limited history was her husband at bedside. Patient has been noted to be confused for past 2 years and has also stopped her medications since then. She has also not been following up with ehr PCP for several months (the last visit being 6-7 months ago as per the husband. Patient on questioning is unable to describe the nature of her chest pain and said C. may have had some chest pain but point at her neck. Husband is also unable to describe the nature of the pain any further. That her blood pressure was elevated with systolic of 190s and elevated fingerstick in 500s.  PCP - Dr. Romero Belling - has been prescribed metformin and Novolog in the past but states she could not afford it, therefore she stopped taking it.  Has meter at home but states she needs strips to check blood sugars.    Results for Dana [name], Dana [name] (MRN [id number]) as of 04/06/2012 12:36  Ref. Range 04/05/2012 09:02 04/05/2012 10:02 04/05/2012 11:15 04/05/2012 12:29 04/05/2012 18:07 04/05/2012 21:30 04/06/2012 07:30 04/06/2012 12:12  Glucose-Capillary Latest Range: 70-99 mg/dL 83 91 366 (H) 440 (H) 347 (H) 262 (H) 208 (H) 248 (H)  Results for Dana [name], Dana [name] (MRN [id number]) as of 04/06/2012 12:36  Ref. Range 04/05/2012 09:30  Hemoglobin A1C Latest Range: <5.7 % 15.8 (H)  Results for Dana [name], Dana [name] (MRN [id number]) as of 04/06/2012 12:36  Ref. Range 04/05/2012 09:30  Sodium Latest  Range: 135-145 mEq/L 141  Potassium Latest Range: 3.5-5.1 mEq/L 3.3 (L)  Chloride Latest Range: 96-112 mEq/L 106  CO2 Latest Range: 19-32 mEq/L 26  Mean Plasma Glucose Latest Range: <117 mg/dL 884 (H)  BUN Latest Range: 6-23 mg/dL 13  Creatinine Latest Range: 0.50-1.10 mg/dL 1.66 (L)  Calcium Latest Range: 8.4-10.5 mg/dL 9.2  GFR calc non Af Amer Latest Range: >90 mL/min >90  GFR calc Af Amer Latest Range: >90 mL/min >90  Glucose Latest Range: 70-99 mg/dL 80  Alkaline Phosphatase Latest Range: 39-117 U/L 53  Albumin Latest Range: 3.5-5.2 g/dL 3.4 (L)   Clearly needs insulin at home with HgbA1C at 15.8%.  Pt willing to give herself insulin in hospital in order for RN to check technique and ability to give injection.  Pt verbalizes she gets no support from husband and daughter.  States they tricked her into coming to the hospital.  States she gets check and doesn't have enough money to cover her meds. May benefit from changing insulin to more affordable NPH and Regular.  Recommendations:  Change basal insulin to NPH 8 units in am and NPH 8 units QHS. Needs meal coverage insulin - Novolog 3 units tidwc and titrate until CBGs < 180. When ready for discharge, change Novolog to Regular insulin and instruct on taking insulin 30 minutes prior to meals. Social Work consult for assistance with meds, ? Conflict with family regarding confusion.  Will follow daily. Thank  you.  Ailene Ards, RD, LDN, CDE Inpatient Diabetes Coordinator [phone]

## 2012-04-07 DIAGNOSIS — F329 Major depressive disorder, single episode, unspecified: Secondary | ICD-10-CM

## 2012-04-07 DIAGNOSIS — Z91199 Patient's noncompliance with other medical treatment and regimen due to unspecified reason: Secondary | ICD-10-CM

## 2012-04-07 DIAGNOSIS — F3289 Other specified depressive episodes: Secondary | ICD-10-CM

## 2012-04-07 DIAGNOSIS — Z9119 Patient's noncompliance with other medical treatment and regimen: Secondary | ICD-10-CM

## 2012-04-07 LAB — BASIC METABOLIC PANEL
BUN: 12 mg/dL (ref 6–23)
Chloride: 107 mEq/L (ref 96–112)
Creatinine, Ser: 0.47 mg/dL — ABNORMAL LOW (ref 0.50–1.10)
GFR calc Af Amer: >90 mL/min (ref >90)

## 2012-04-07 LAB — CBC
HCT: 33.3 % — ABNORMAL LOW (ref 36.0–46.0)
MCHC: 33.9 g/dL (ref 30.0–36.0)
MCV: 82.4 fL (ref 78.0–100.0)
RDW: 13.9 % (ref 11.5–15.5)
WBC: 4.2 10*3/uL (ref 4.0–10.5)

## 2012-04-07 LAB — GLUCOSE, CAPILLARY: Glucose-Capillary: 95 mg/dL (ref 70–99)

## 2012-04-07 MED ORDER — INSULIN NPH (HUMAN) (ISOPHANE) 100 UNIT/ML ~~LOC~~ SUSP: 8.0000 [IU] | Freq: Two times a day (BID) | SUBCUTANEOUS | Status: DC

## 2012-04-07 MED ORDER — SIMVASTATIN 10 MG PO TABS: 10.0000 mg | ORAL_TABLET | Freq: Every day | ORAL | Status: DC

## 2012-04-07 MED ORDER — INSULIN ASPART 100 UNIT/ML ~~LOC~~ SOLN: 3.0000 [IU] | Freq: Three times a day (TID) | SUBCUTANEOUS | Status: DC
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
## 2012-04-07 NOTE — Discharge Summary (Signed)
Physician Discharge Summary  Dana [name] NWG:[id number] DOB: 03-01-1946 DOA: 04/04/2012  PCP: Romero Belling, MD  Admit date: 04/04/2012 Discharge date: 04/07/2012  Recommendations for Outpatient Follow-up:  Home health RN for diabetes and PT Outpatient referral to psych  Discharge Diagnoses:  Principal Problem:  *Chest pain Active Problems:  HYPERCHOLESTEROLEMIA  DEPRESSION  HYPERTENSION  Uncontrolled diabetes mellitus  Confusion  Hypokalemia   Discharge Condition: improved  Diet recommendation: diabetic  Filed Weights   04/05/12 1047 04/06/12 0500 04/07/12 0500  Weight: 57.561 kg (126 lb 14.4 oz) 60.419 kg (133 lb 3.2 oz) 60.8 kg (134 lb 0.6 oz)    History of present illness:  year-old female with history off type 2 diabetes mellitus (not taking her medications for past 2 years), hypertension, depression, anxiety, allergic rhinitis who was brought in by EMS for her symptoms all chest pain. Patient is a very poor historian and limited history was her husband at bedside. Patient has been noted to be confused for past 2 years and has also stopped her medications since then. She has aloso not been following up with ehr PCP for several months (the last visit being 6-7 months ago as per the husband however in the system her last visit was almost 18 months ago). Patient on questioning is unable to describe the nature of her chest pain and said C. may have had some chest pain but point at her neck. Husband is also unable to describe the nature of the pain any further. That her blood pressure was elevated with systolic of 190s and elevated fingerstick in 500s. Brought to the ED where she was noted to be hypertensive with elevated blood glucose without any anion gap. An EKG was obtained which was unremarkable. Initial set of troponin was negative as well. [facility] hospitalist called in for admission under observation for her chest pain symptoms.   Hospital Course:  1. Chest pain- EKG and tele  normal, CE WNL. Think from anxiety about coming to the hospital 2. Paranoia with some dementia- patient has quit taking her medications, family relays she hides her checks and will not cash them, she spends all her money when she goes out. She thinks her family tricked her into coming into the ER. She jumps out of moving cars and places the car in park while moving.  Psych says patient has capacity and is ok to go home to follow up with PCP. TSH ok 3. HTN- home meds 4. HLD 5. Uncontrolled DM- NPH BID with SSI and 3 units with meals, home health for help with control   Consultations:  psych  Discharge Exam: Filed Vitals:   04/06/12 1524 04/06/12 2147 04/07/12 0500 04/07/12 1340  BP: 139/77 127/54 103/55 135/65  Pulse: 86 92 77 91  Temp: 99 F (37.2 C) 98.5 F (36.9 C) 98.6 F (37 C) 98.1 F (36.7 C)  TempSrc: Oral Oral Oral Oral  Resp: 20 20 18 20   Height:      Weight:   60.8 kg (134 lb 0.6 oz)   SpO2: 100% 100% 100% 100%    General: A+Ox3, cooperative Cardiovascular: rrr Respiratory: clear anterior  Discharge Instructions  Discharge Orders    Future Orders Please Complete By Expires   Diet Carb Modified      Increase activity slowly      Discharge instructions      Comments:   Home health PT/RN Record BS and bring to PCP   Driving Restrictions      Comments:  No driving until seen by PCP   Discharge instructions      Comments:   24 hour care at home       Medication List     As of 04/07/2012  4:09 PM    TAKE these medications         insulin aspart 100 UNIT/ML injection   Commonly known as: novoLOG   Inject 3 Units into the skin 3 (three) times daily with meals.      insulin NPH 100 UNIT/ML injection   Commonly known as: HUMULIN N,NOVOLIN N   Inject 8 Units into the skin 2 (two) times daily.      simvastatin 10 MG tablet   Commonly known as: ZOCOR   Take 1 tablet (10 mg total) by mouth daily at 6 PM.           Follow-up Information    Follow  up with Romero Belling, MD. In 1 week.   Contact information:   [address] [facility] 4th [address] [place] [address] [phone]           The results of significant diagnostics from this hospitalization (including imaging, microbiology, ancillary and laboratory) are listed below for reference.    Significant Diagnostic Studies: Dg Chest 2 View  04/04/2012  *RADIOLOGY REPORT*  Clinical Data: Chest pain and shortness of breath.  CHEST - 2 VIEW  Comparison: 08/15/2008  Findings: The heart size and pulmonary vascularity are normal. There is chronic peribronchial thickening and coronary artery calcification.  No acute infiltrates or effusions.  No acute osseous abnormality.  IMPRESSION: No acute abnormalities.  Chronic bronchitic changes.  Coronary artery calcifications.   Original Report Authenticated By: Gwynn Burly, M.D.    Ct Head Wo Contrast  04/05/2012  *RADIOLOGY REPORT*  Clinical Data: Altered mental status.  Dizziness.  CT HEAD WITHOUT CONTRAST  Technique:  Contiguous axial images were obtained from the base of the skull through the vertex without contrast.  Comparison: 06/02/2008.  Findings: Mild generalized atrophy and subcortical white matter hypoattenuation is similar to the prior exam.  No acute cortical infarct, hemorrhage, mass lesion is present.  The ventricles are of normal size.  No significant extra-axial fluid collection is present.  Vascular calcifications present within the cavernous carotid arteries and at the dural margin of the vertebral arteries bilaterally.  The paranasal sinuses and mastoid air cells are clear.  The osseous skull is intact.  IMPRESSION:  1.  No acute intracranial abnormality or significant interval change. 2.  Stable mild atrophy and white matter disease, potentially within normal limits for age. 3.  Atherosclerosis.   Original Report Authenticated By: Jamesetta Orleans. MATTERN, M.D.     Microbiology: No results found for this or any previous visit (from  the past 240 hour(s)).   Labs: Basic Metabolic Panel:  Lab 04/07/12 9147 04/05/12 0930 04/04/12 1955  NA 139 141 131*  K 3.7 3.3* 4.1  CL 107 106 95*  CO2 25 26 26   GLUCOSE 89 80 519*  BUN 12 13 19   CREATININE 0.47* 0.43* 0.54  CALCIUM 8.5 9.2 9.5  MG -- -- --  PHOS -- -- --   Liver Function Tests:  Lab 04/05/12 0930  AST 12  ALT 12  ALKPHOS 53  BILITOT 0.9  PROT 6.5  ALBUMIN 3.4*   No results found for this basename: LIPASE:5,AMYLASE:5 in the last 168 hours No results found for this basename: AMMONIA:5 in the last 168 hours CBC:  Lab 04/07/12 0453  04/04/12 1955  WBC 4.2 4.6  NEUTROABS -- --  HGB 11.3* 13.4  HCT 33.3* 39.1  MCV 82.4 82.0  PLT 254 290   Cardiac Enzymes:  Lab 04/05/12 1250 04/05/12 0930 04/04/12 1955  CKTOTAL -- -- --  CKMB -- -- --  CKMBINDEX -- -- --  TROPONINI <0.30 <0.30 <0.30   BNP: BNP (last 3 results) No results found for this basename: PROBNP:3 in the last 8760 hours CBG:  Lab 04/07/12 1136 04/07/12 0720 04/06/12 2135 04/06/12 1634 04/06/12 1212  GLUCAP 204* 95 230* 231* 248*    Time coordinating discharge: 35 minutes  Signed:  Benjamine Mola, Minyon Billiter  [facility] 04/07/2012, 4:09 PM

## 2012-04-07 NOTE — Progress Notes (Signed)
  Pt was combative and argumentative while family in room. Refused to stay in seated in chair, tried to pull out IV, and stated she was going home and no one could keep her here. She has a Recruitment consultant in the room. She finally settled in and rested while S/S in room. A family member called to ask how she was doing; at the time she was resting and I told her so. I will continue to monitor her behavior and safety during the shift.

## 2012-04-07 NOTE — Progress Notes (Signed)
Physical Therapy Treatment Patient Details Name: Dana [name] MRN: [id number] DOB: Jul 28, 1945 Today's Date: 04/07/2012 Time: 4098-1191 PT Time Calculation (min): 23 min  PT Assessment / Plan / Recommendation Comments on Treatment Session  Pt seems in better cognitive state today with less confusion and less impulsivity.  Husband present at end of session and states that he is there with her at home 24/7, however they will need a RW at D/C.     Follow Up Recommendations  Home health PT    Barriers to Discharge        Equipment Recommendations  Rolling walker with 5" wheels    Recommendations for Other Services    Frequency Min 3X/week   Plan Discharge plan remains appropriate    Precautions / Restrictions Precautions Precautions: Fall Restrictions Weight Bearing Restrictions: No   Pertinent Vitals/Pain No pain    Mobility  Bed Mobility Bed Mobility: Supine to Sit Supine to Sit: 5: Supervision Details for Bed Mobility Assistance: Supervision and cues for safety.  Noted pt to be impulsive with all mobility with max cuing for safety.  Transfers Transfers: Sit to Stand;Stand to Sit Sit to Stand: 5: Supervision;With upper extremity assist;From bed Stand to Sit: 5: Supervision;With upper extremity assist;With armrests;To chair/3-in-1 Details for Transfer Assistance: Supervision for safety.  Min cues for hand placement and safety.  Less impulsive today.  Ambulation/Gait Ambulation/Gait Assistance: 4: Min guard Ambulation Distance (Feet): 200 Feet Assistive device: Rolling walker Ambulation/Gait Assistance Details: Pt doing better with RW today and did not require as much assist for steering.  Min cues for positioning inside of RW when turning or negotiating around objects.  Gait Pattern: Step-through pattern    Exercises General Exercises - Lower Extremity Ankle Circles/Pumps: Strengthening;Both;15 reps Long Arc Quad: Strengthening;Both;10 reps Hip ABduction/ADduction:  Strengthening;Both;10 reps (did add with pillow and abd w/ manual resistance) Hip Flexion/Marching: Strengthening;Both;20 reps   PT Diagnosis:    PT Problem List:   PT Treatment Interventions:     PT Goals Acute Rehab PT Goals PT Goal Formulation: With patient Time For Goal Achievement: 04/13/12 Potential to Achieve Goals: Good Pt will Ambulate: >150 feet;with modified independence;with least restrictive assistive device PT Goal: Ambulate - Progress: Progressing toward goal Pt will Perform Home Exercise Program: with supervision, verbal cues required/provided PT Goal: Perform Home Exercise Program - Progress: Progressing toward goal  Visit Information  Last PT Received On: 04/07/12 Assistance Needed: +1    Subjective Data  Subjective: I'm ready to get out of this hospital.    Cognition  Overall Cognitive Status: History of cognitive impairments - at baseline Arousal/Alertness: Awake/alert Orientation Level: Disoriented to;Time;Situation Behavior During Session: WFL for tasks performed Cognition - Other Comments: Pt less confused today, however saw husband in hall and stated that she did not have a husband.  However when questioned later about husband, she told therapist his name and how long they had been married.     Balance     End of Session PT - End of Session Equipment Utilized During Treatment: Gait belt Activity Tolerance: Patient tolerated treatment well Patient left: in chair;with call bell/phone within reach;with chair alarm set;with family/visitor present Nurse Communication: Mobility status   GP     Page, Meribeth Mattes 04/07/2012, 9:24 AM

## 2012-04-07 NOTE — Progress Notes (Signed)
Psych CSW conferred with psych MD.  Per psych MD, Pt ok to d/c home.  Psych MD not recommending outpt f/u.  Providence Crosby, LCSWA Clinical Social Work [phone]

## 2012-04-14 ENCOUNTER — Telehealth: Payer: Self-pay | Admitting: *Deleted

## 2012-04-14 ENCOUNTER — Ambulatory Visit: Payer: Medicare Other | Admitting: Endocrinology

## 2012-04-14 DIAGNOSIS — Z0289 Encounter for other administrative examinations: Secondary | ICD-10-CM

## 2012-04-14 NOTE — Telephone Encounter (Signed)
[facility] received call from pt's husband this morning regarding her blood sugar. When pt checked this morning it read HIGH and when they checked it again, CBG was 577. [facility] will send out a RN today to pt's home to evaluate her. They want MD's advisement on how much insulin pt needs to be given so that she does not have to go to the ER (pt is currently taking Novolog 3 units tid ac and Humulin N 8 units BID).

## 2012-04-14 NOTE — Telephone Encounter (Signed)
Ov today.

## 2012-04-14 NOTE — Telephone Encounter (Signed)
Pt informed of MD's advisement. Pt has appointment scheduled for 2:45p today but states that she was unaware of that appointment today. Pt states that she will do her best to get here.

## 2012-04-21 ENCOUNTER — Telehealth: Payer: Self-pay | Admitting: Endocrinology

## 2012-04-25 ENCOUNTER — Ambulatory Visit: Payer: Medicare Other | Admitting: Endocrinology

## 2012-04-25 DIAGNOSIS — Z0289 Encounter for other administrative examinations: Secondary | ICD-10-CM

## 2012-04-27 NOTE — Telephone Encounter (Signed)
No more until ov

## 2012-04-27 NOTE — Telephone Encounter (Signed)
Caller: Kathy/Other; Patient Name: Dana [name]; PCP: Romero Belling (Adults only); Best Callback Phone Number: [phone]; Call regarding: Physical Therapy; Francisco Capuchin is a PT from [facility] calling for an order to continue PT one more day this week and two days next week; called on 10/3, but received no response; please fax order to [phone]

## 2012-04-28 NOTE — Telephone Encounter (Signed)
Called PT Dana [name] and Lm stating Dr. George Hugh response.

## 2012-05-16 ENCOUNTER — Telehealth: Payer: Self-pay

## 2012-05-16 NOTE — Telephone Encounter (Signed)
HHRN called to report that pt was very upset at visit this morning because of argument with spouse, BP 170/90, 160/88 CBG 370. Last week RN reports pt was upset after another argument with husband and husband states that pt is getting worse in regards to taking her medication

## 2012-05-16 NOTE — Telephone Encounter (Signed)
Ov soon

## 2012-06-29 ENCOUNTER — Ambulatory Visit: Payer: Medicare Other | Admitting: Endocrinology

## 2012-08-11 ENCOUNTER — Telehealth: Payer: Self-pay | Admitting: *Deleted

## 2012-08-11 NOTE — Telephone Encounter (Signed)
Notified Levin Bacon at [facility] to notify case manager of need to have patient get appt. Before forms can be filled out for certification by Dr. Everardo All.

## 2012-09-09 ENCOUNTER — Telehealth: Payer: Self-pay | Admitting: Endocrinology

## 2012-09-09 NOTE — Telephone Encounter (Signed)
please call patient: Please come in for an appointment soon, as we are really worried about you.

## 2012-09-13 ENCOUNTER — Telehealth: Payer: Self-pay | Admitting: Endocrinology

## 2012-09-13 NOTE — Telephone Encounter (Signed)
Spoke with patient and scheduled appt for patient to come in on 09/19/12 at 3:30pm per Dr. George Hugh request. FYI message.

## 2012-09-19 ENCOUNTER — Ambulatory Visit: Payer: Medicare Other | Admitting: Endocrinology

## 2012-09-23 ENCOUNTER — Ambulatory Visit: Payer: Medicare Other | Admitting: Endocrinology

## 2012-09-24 ENCOUNTER — Inpatient Hospital Stay (HOSPITAL_COMMUNITY)
Admission: EM | Admit: 2012-09-24 | Discharge: 2012-09-27 | DRG: 638 | Disposition: A | Discharge status: Home Health | Payer: Medicare Other | Attending: Internal Medicine | Admitting: Internal Medicine

## 2012-09-24 DIAGNOSIS — I10 Essential (primary) hypertension: Secondary | ICD-10-CM | POA: Diagnosis present

## 2012-09-24 DIAGNOSIS — E11319 Type 2 diabetes mellitus with unspecified diabetic retinopathy without macular edema: Secondary | ICD-10-CM | POA: Diagnosis present

## 2012-09-24 DIAGNOSIS — Z794 Long term (current) use of insulin: Secondary | ICD-10-CM

## 2012-09-24 DIAGNOSIS — F3289 Other specified depressive episodes: Secondary | ICD-10-CM | POA: Diagnosis present

## 2012-09-24 DIAGNOSIS — IMO0002 Reserved for concepts with insufficient information to code with codable children: Principal | ICD-10-CM | POA: Diagnosis present

## 2012-09-24 DIAGNOSIS — Z91199 Patient's noncompliance with other medical treatment and regimen due to unspecified reason: Secondary | ICD-10-CM

## 2012-09-24 DIAGNOSIS — E785 Hyperlipidemia, unspecified: Secondary | ICD-10-CM | POA: Diagnosis present

## 2012-09-24 DIAGNOSIS — F0390 Unspecified dementia without behavioral disturbance: Secondary | ICD-10-CM | POA: Diagnosis present

## 2012-09-24 DIAGNOSIS — F411 Generalized anxiety disorder: Secondary | ICD-10-CM | POA: Diagnosis present

## 2012-09-24 DIAGNOSIS — E7800 Pure hypercholesterolemia, unspecified: Secondary | ICD-10-CM | POA: Diagnosis present

## 2012-09-24 DIAGNOSIS — E871 Hypo-osmolality and hyponatremia: Secondary | ICD-10-CM | POA: Diagnosis present

## 2012-09-24 DIAGNOSIS — Z96659 Presence of unspecified artificial knee joint: Secondary | ICD-10-CM

## 2012-09-24 HISTORY — DX: Unspecified dementia, unspecified severity, without behavioral disturbance, psychotic disturbance, mood disturbance, and anxiety: F03.90

## 2012-09-24 LAB — COMPREHENSIVE METABOLIC PANEL
Albumin: 3.6 g/dL (ref 3.5–5.2)
BUN: 19 mg/dL (ref 6–23)
Creatinine, Ser: 0.64 mg/dL (ref 0.50–1.10)
Total Protein: 7.1 g/dL (ref 6.0–8.3)

## 2012-09-24 LAB — CBC WITH DIFFERENTIAL/PLATELET
Eosinophils Relative: 1 % (ref 0–5)
HCT: 38.2 % (ref 36.0–46.0)
Hemoglobin: 13.1 g/dL (ref 12.0–15.0)
Lymphocytes Relative: 22 % (ref 12–46)
Lymphs Abs: 1.1 10*3/uL (ref 0.7–4.0)
MCV: 82.3 fL (ref 78.0–100.0)
Monocytes Absolute: 0.5 10*3/uL (ref 0.1–1.0)
Monocytes Relative: 10 % (ref 3–12)
RBC: 4.64 MIL/uL (ref 3.87–5.11)
WBC: 5.1 10*3/uL (ref 4.0–10.5)

## 2012-09-24 LAB — GLUCOSE, CAPILLARY
Glucose-Capillary: 546 mg/dL — ABNORMAL HIGH (ref 70–99)
Glucose-Capillary: 296 mg/dL — ABNORMAL HIGH (ref 70–99)

## 2012-09-24 LAB — URINE MICROSCOPIC-ADD ON

## 2012-09-24 LAB — BLOOD GAS, ARTERIAL
Acid-Base Excess: 2.2 mmol/L — ABNORMAL HIGH (ref 0.0–2.0)
Drawn by: 11249
O2 Saturation: 95.9 %
pO2, Arterial: 79.2 mmHg — ABNORMAL LOW (ref 80.0–100.0)

## 2012-09-24 LAB — URINALYSIS, ROUTINE W REFLEX MICROSCOPIC
Bilirubin Urine: NEGATIVE
Glucose, UA: >1000 mg/dL — AB
Ketones, ur: NEGATIVE mg/dL
Leukocytes, UA: NEGATIVE
pH: 5 (ref 5.0–8.0)

## 2012-09-24 LAB — TROPONIN I: Troponin I: <0.3 ng/mL (ref <0.30)

## 2012-09-24 MED ORDER — INSULIN REGULAR HUMAN 100 UNIT/ML IJ SOLN
INTRAMUSCULAR | Status: DC
  Administered 2012-09-25: 2.4 [IU]/h via INTRAVENOUS
  Filled 2012-09-24: qty 1

## 2012-09-24 MED ORDER — SODIUM CHLORIDE 0.9 % IV SOLN: INTRAVENOUS | Status: DC

## 2012-09-24 MED ORDER — SODIUM CHLORIDE 0.9 % IV SOLN
INTRAVENOUS | Status: DC
  Administered 2012-09-25 – 2012-09-27 (×3): via INTRAVENOUS

## 2012-09-24 MED ORDER — SODIUM CHLORIDE 0.9 % IV BOLUS (SEPSIS)
500.0000 mL | Freq: Once | INTRAVENOUS | Status: AC
  Administered 2012-09-24: 500 mL via INTRAVENOUS

## 2012-09-24 MED ORDER — INSULIN REGULAR BOLUS VIA INFUSION
0.0000 [IU] | Freq: Three times a day (TID) | INTRAVENOUS | Status: DC
  Filled 2012-09-24: qty 10

## 2012-09-24 MED ORDER — DEXTROSE 50 % IV SOLN: 25.0000 mL | INTRAVENOUS | Status: DC | PRN

## 2012-09-24 MED ORDER — DEXTROSE-NACL 5-0.45 % IV SOLN
INTRAVENOUS | Status: DC
  Administered 2012-09-25: 02:00:00 via INTRAVENOUS

## 2012-09-24 NOTE — H&P (Signed)
History and Physical  Dana [name] ZOX:[id number] DOB: 1945/09/17 DOA: 09/24/2012  Referring physician: Dr. Clarene Duke PCP: Romero Belling, MD   Chief Complaint: High blood sugar  HPI:  67 year old woman presented via EMS for history of hyperglycemia and combativeness. Blood sugar noted behind, started on insulin infusion and referred for admission. Knowledge deficit and education needs were identified.  History obtained from patient and husband at bedside. Blood sugar has been running "high". Today she was somewhat agitated, dizzy and noted to have high blood sugar. The patient denies any complaints and has difficulty articulating why she is here and appears to have some dementia. She is unable to articulate her insulin regimen or recall the name. It does not appear that husband is fluent with her regimen either. Education on diet requested. Husband does not report any other particular medical problems.  In ED Afebrile, VSS. ABG pH normal Glucose 451  EKG sinus rhythm, no acute changes.  Review of Systems:  Negative for fever, visual changes, sore throat, rash, new muscle aches, chest pain, SOB, dysuria, bleeding, n/v/abdominal pain.  Past Medical History  Diagnosis Date  . ALLERGIC RHINITIS 02/08/2007  . ANXIETY 02/08/2007  . Cough 08/15/2008  . DEPRESSION 02/08/2007  . DIABETES MELLITUS, TYPE I 02/08/2007  . Headache 11/06/2008  . HYPERCHOLESTEROLEMIA 11/05/2008  . HYPERTENSION 02/08/2007  . URINARY INCONTINENCE 02/08/2007  . Dyslipidemia   . Non-ulcer dyspepsia   . Leukopenia   . DM retinopathy     background  . OA (osteoarthritis) 1990    disabled due to OA    Past Surgical History  Procedure Laterality Date  . Total knee arthroplasty  1990    left  . Panendoscopy  10/27/1999  . Stress cardiolite  12/14/2003  . Electrocardiogram  05/17/2006    Social History:  reports that she has never smoked. She has never used smokeless tobacco. She reports that she does not drink alcohol or  use illicit drugs.  Allergies  Allergen Reactions  . Aspirin Nausea Only  . Codeine Nausea Only    Family History  Problem Relation Age of Onset  . Alcohol abuse Son   . Cancer Neg Hx     no cancer in immediate family     Prior to Admission medications   Medication Sig Start Date End Date Taking? Authorizing Provider  insulin aspart (NOVOLOG) 100 UNIT/ML injection Inject 3 Units into the skin 3 (three) times daily with meals. 04/07/12  Yes Jessica U Vann, DO  insulin NPH (HUMULIN N,NOVOLIN N) 100 UNIT/ML injection Inject 8 Units into the skin 2 (two) times daily. 04/07/12  Yes Joseph Art, DO  Multiple Vitamin (MULTIVITAMIN WITH MINERALS) TABS Take 1 tablet by mouth every morning.   Yes Historical Provider, MD   Physical Exam: Filed Vitals:   09/24/12 2038 09/24/12 2228  BP: 111/69 107/55  Pulse: 103   Temp: 98.1 F (36.7 C) 98.2 F (36.8 C)  TempSrc: Oral Oral  Resp: 18 13  SpO2: 98% 98%    General:  Examined in emergency department. Appears calm and comfortable Eyes: Pupils, irises, lids appear unremarkable. ENT: grossly normal hearing, lips & tongue Neck: no LAD, masses or thyromegaly Cardiovascular: RRR, no m/r/g. No LE edema. Respiratory: CTA bilaterally, no w/r/r. Normal respiratory effort. Abdomen: soft, ntnd Skin: no rash or induration seen on limited exam Musculoskeletal: grossly normal tone BUE/BLE, bilateral feet without ulcer or gross lesions. Psychiatric: grossly normal mood. Odd affect. Speech fluent and appropriate. Neurologic: grossly non-focal.  Wt  Readings from Last 3 Encounters:  04/07/12 60.8 kg (134 lb 0.6 oz)  06/30/11 57.607 kg (127 lb)  06/24/10 59.421 kg (131 lb)    Labs on Admission:  Basic Metabolic Panel:  Recent Labs Lab 09/24/12 2100  NA 130*  K 3.8  CL 93*  CO2 25  GLUCOSE 451*  BUN 19  CREATININE 0.64  CALCIUM 9.5    Liver Function Tests:  Recent Labs Lab 09/24/12 2100  AST 13  ALT 16  ALKPHOS 56  BILITOT  1.2  PROT 7.1  ALBUMIN 3.6   CBC:  Recent Labs Lab 09/24/12 2100  WBC 5.1  NEUTROABS 3.4  HGB 13.1  HCT 38.2  MCV 82.3  PLT 263    Cardiac Enzymes:  Recent Labs Lab 09/24/12 2100  TROPONINI <0.30    CBG:  Recent Labs Lab 09/24/12 2035  GLUCAP 546*   EKG: Independently reviewed. As above   Principal Problem:   Diabetes mellitus type 2, uncontrolled Active Problems:   Dehydration   Noncompliance   Assessment/Plan 1. Uncontrolled Diabetes mellitus type 2: Hyperglycemia without ketosis. Suspect secondary to noncompliance given history. No signs or symptoms to suggest infection, neurologic or cardiogenic event. Glucose stabilizer, when improved transition to subcutaneous insulin. Knowledge deficit. Will need inpatient and outpatient education. Dietitian consult, inpatient diabetes nurse consult. 2. Dehydration: IV fluids. 3. Noncompliance: Noncompliance with followup noted by Dr. Everardo All in previous office and telephone notes. Barrier unclear.  4. Suspected dementia: Consider outpatient evaluation.  Code Status: Full code Family Communication: Discussed with family at bedside Disposition Plan/Anticipated LOS: Admit, 2 days  Time spent: 50 minutes  Brendia Sacks, MD  [facility] Pager [phone] 09/24/2012, 10:54 PM

## 2012-09-24 NOTE — ED Notes (Signed)
Pt transported via EMS from [facility] for c/o hyperglycemia. Pt is confused but is at baseline per EMS. Original call was for combative patient. Pt is calm at this time, initial CBG HIGH. Novolog 3 UNITS given by family on scene.  Per EMS pt and family need education on injection site and Insulin administration.

## 2012-09-24 NOTE — ED Provider Notes (Signed)
History     CSN: [id number]  Arrival date & time 09/24/12  2024   First MD Initiated Contact with Patient 09/24/12 2055      Chief Complaint  Patient presents with  . Hyperglycemia     HPI Pt was seen at 2120.   Per pt and family, c/o gradual onset and persistence of constant "high blood sugars" that began today.  Pt's family states pt has hx of confusion, has been at her baseline.  EMS noted pt's CBG to read "high" en route.  EMS noted that family needed extensive education on insulin dosing and administration at the scene.  Pt's family gave her 3 units of novolog SQ before coming to the ED.  Pt denies CP/SOB, no cough, no abd pain, no N/V/D, no fevers.     Past Medical History  Diagnosis Date  . ALLERGIC RHINITIS 02/08/2007  . ANXIETY 02/08/2007  . Cough 08/15/2008  . DEPRESSION 02/08/2007  . DIABETES MELLITUS, TYPE I 02/08/2007  . Headache 11/06/2008  . HYPERCHOLESTEROLEMIA 11/05/2008  . HYPERTENSION 02/08/2007  . URINARY INCONTINENCE 02/08/2007  . Dyslipidemia   . Non-ulcer dyspepsia   . Leukopenia   . DM retinopathy     background  . OA (osteoarthritis) 1990    disabled due to OA    Past Surgical History  Procedure Laterality Date  . Total knee arthroplasty  1990    left  . Panendoscopy  10/27/1999  . Stress cardiolite  12/14/2003  . Electrocardiogram  05/17/2006    Family History  Problem Relation Age of Onset  . Alcohol abuse Son   . Cancer Neg Hx     no cancer in immediate family    History  Substance Use Topics  . Smoking status: Never Smoker   . Smokeless tobacco: Never Used  . Alcohol Use: No    Review of Systems ROS: Statement: All systems negative except as marked or noted in the HPI; Constitutional: Negative for fever and chills. ; ; Eyes: Negative for eye pain, redness and discharge. ; ; ENMT: Negative for ear pain, hoarseness, nasal congestion, sinus pressure and sore throat. ; ; Cardiovascular: Negative for chest pain, palpitations, diaphoresis,  dyspnea and peripheral edema. ; ; Respiratory: Negative for cough, wheezing and stridor. ; ; Gastrointestinal: Negative for nausea, vomiting, diarrhea, abdominal pain, blood in stool, hematemesis, jaundice and rectal bleeding. . ; ; Genitourinary: Negative for dysuria, flank pain and hematuria. ; ; Musculoskeletal: Negative for back pain and neck pain. Negative for swelling and trauma.; ; Skin: Negative for pruritus, rash, abrasions, blisters, bruising and skin lesion.; ; Neuro: Negative for headache, lightheadedness and neck stiffness. Negative for weakness, altered level of consciousness , altered mental status, extremity weakness, paresthesias, involuntary movement, seizure and syncope.      Allergies  Aspirin and Codeine  Home Medications   Current Outpatient Rx  Name  Route  Sig  Dispense  Refill  . insulin aspart (NOVOLOG) 100 UNIT/ML injection   Subcutaneous   Inject 3 Units into the skin 3 (three) times daily with meals.         . insulin NPH (HUMULIN N,NOVOLIN N) 100 UNIT/ML injection   Subcutaneous   Inject 8 Units into the skin 2 (two) times daily.         . Multiple Vitamin (MULTIVITAMIN WITH MINERALS) TABS   Oral   Take 1 tablet by mouth every morning.           BP 107/55  Pulse 103  Temp(Src) 98.2 F (36.8 C) (Oral)  Resp 13  SpO2 98%  Physical Exam 2125: Physical examination:  Nursing notes reviewed; Vital signs and O2 SAT reviewed;  Constitutional: Well developed, Well nourished, In no acute distress; Head:  Normocephalic, atraumatic; Eyes: EOMI, PERRL, No scleral icterus; ENMT: Mouth and pharynx normal, Mucous membranes dry; Neck: Supple, Full range of motion, No lymphadenopathy; Cardiovascular: Regular rate and rhythm, No gallop; Respiratory: Breath sounds clear & equal bilaterally, No rales, rhonchi, wheezes.  Speaking full sentences with ease, Normal respiratory effort/excursion; Chest: Nontender, Movement normal; Abdomen: Soft, Nontender, Nondistended,  Normal bowel sounds;; Extremities: Pulses normal, No tenderness, No edema, No calf edema or asymmetry.; Neuro: AA&Ox3, mildly confused re: events per hx per family at bedside. Major CN grossly intact.  Speech clear. No gross focal motor or sensory deficits in extremities.; Skin: Color normal, Warm, Dry.    ED Course  Procedures    MDM  MDM Reviewed: previous chart, nursing note and vitals Reviewed previous: labs Interpretation: labs, ECG and x-ray    Date: 09/24/2012  Rate: 102  Rhythm: sinus tachycardia  QRS Axis: normal  Intervals: normal  ST/T Wave abnormalities: normal  Conduction Disutrbances:nonspecific intraventricular conduction delay  Narrative Interpretation:   Old EKG Reviewed: unchanged; no significant changes from previous EKG dated 04/04/2012.  Results for orders placed during the hospital encounter of 09/24/12  GLUCOSE, CAPILLARY      Result Value Range   Glucose-Capillary 546 (*) 70 - 99 mg/dL   Comment 1 Documented in Chart     Comment 2 Notify RN    COMPREHENSIVE METABOLIC PANEL      Result Value Range   Sodium 130 (*) 135 - 145 mEq/L   Potassium 3.8  3.5 - 5.1 mEq/L   Chloride 93 (*) 96 - 112 mEq/L   CO2 25  19 - 32 mEq/L   Glucose, Bld 451 (*) 70 - 99 mg/dL   BUN 19  6 - 23 mg/dL   Creatinine, Ser 1.61  0.50 - 1.10 mg/dL   Calcium 9.5  8.4 - 09.6 mg/dL   Total Protein 7.1  6.0 - 8.3 g/dL   Albumin 3.6  3.5 - 5.2 g/dL   AST 13  0 - 37 U/L   ALT 16  0 - 35 U/L   Alkaline Phosphatase 56  39 - 117 U/L   Total Bilirubin 1.2  0.3 - 1.2 mg/dL   GFR calc non Af Amer 90 (*) >90 mL/min   GFR calc Af Amer >90  >90 mL/min  CBC WITH DIFFERENTIAL      Result Value Range   WBC 5.1  4.0 - 10.5 K/uL   RBC 4.64  3.87 - 5.11 MIL/uL   Hemoglobin 13.1  12.0 - 15.0 g/dL   HCT 04.5  40.9 - 81.1 %   MCV 82.3  78.0 - 100.0 fL   MCH 28.2  26.0 - 34.0 pg   MCHC 34.3  30.0 - 36.0 g/dL   RDW 91.4  78.2 - 95.6 %   Platelets 263  150 - 400 K/uL   Neutrophils Relative 66   43 - 77 %   Neutro Abs 3.4  1.7 - 7.7 K/uL   Lymphocytes Relative 22  12 - 46 %   Lymphs Abs 1.1  0.7 - 4.0 K/uL   Monocytes Relative 10  3 - 12 %   Monocytes Absolute 0.5  0.1 - 1.0 K/uL   Eosinophils Relative 1  0 - 5 %  Eosinophils Absolute 0.1  0.0 - 0.7 K/uL   Basophils Relative 1  0 - 1 %   Basophils Absolute 0.0  0.0 - 0.1 K/uL  URINALYSIS, ROUTINE W REFLEX MICROSCOPIC      Result Value Range   Color, Urine YELLOW  YELLOW   APPearance CLEAR  CLEAR   Specific Gravity, Urine 1.039 (*) 1.005 - 1.030   pH 5.0  5.0 - 8.0   Glucose, UA >1000 (*) NEGATIVE mg/dL   Hgb urine dipstick NEGATIVE  NEGATIVE   Bilirubin Urine NEGATIVE  NEGATIVE   Ketones, ur NEGATIVE  NEGATIVE mg/dL   Protein, ur NEGATIVE  NEGATIVE mg/dL   Urobilinogen, UA 0.2  0.0 - 1.0 mg/dL   Nitrite NEGATIVE  NEGATIVE   Leukocytes, UA NEGATIVE  NEGATIVE  TROPONIN I      Result Value Range   Troponin I <0.30  <0.30 ng/mL  BLOOD GAS, ARTERIAL      Result Value Range   FIO2 0.21     pH, Arterial 7.403  7.350 - 7.450   pCO2 arterial 43.9  35.0 - 45.0 mmHg   pO2, Arterial 79.2 (*) 80.0 - 100.0 mmHg   Bicarbonate 26.8 (*) 20.0 - 24.0 mEq/L   TCO2 24.0  0 - 100 mmol/L   Acid-Base Excess 2.2 (*) 0.0 - 2.0 mmol/L   O2 Saturation 95.9     Patient temperature 98.6     Collection site LEFT RADIAL     Drawn by [id number]     Sample type ARTERIAL DRAW     Allens test (pass/fail) PASS  PASS  URINE MICROSCOPIC-ADD ON      Result Value Range   Squamous Epithelial / LPF RARE  RARE   WBC, UA 0-2  <3 WBC/hpf   RBC / HPF 0-2  <3 RBC/hpf   Bacteria, UA RARE  RARE     2255:  Pt continues without complaints.  Labs with hyperglycemia, not acidotic.  AG 12.  Na corrects to 136 for elevated glucose.  Pt given IVF, will start IV insulin gtt.  Dx and testing d/w pt and family.  Questions answered.  Verb understanding, agreeable to admit.  T/C to [facility]. Irene Limbo, case discussed, including:  HPI, pertinent PM/SHx, VS/PE, dx testing,  ED course and treatment:  Agreeable to observation admit, requests to write temporary orders, obtain medical bed to team 8.              Laray Anger, DO 09/26/12 2219

## 2012-09-25 ENCOUNTER — Encounter (HOSPITAL_COMMUNITY): Payer: Self-pay | Admitting: *Deleted

## 2012-09-25 DIAGNOSIS — E860 Dehydration: Secondary | ICD-10-CM

## 2012-09-25 LAB — GLUCOSE, CAPILLARY
Glucose-Capillary: 117 mg/dL — ABNORMAL HIGH (ref 70–99)
Glucose-Capillary: 276 mg/dL — ABNORMAL HIGH (ref 70–99)
Glucose-Capillary: 113 mg/dL — ABNORMAL HIGH (ref 70–99)
Glucose-Capillary: 77 mg/dL (ref 70–99)
Glucose-Capillary: 333 mg/dL — ABNORMAL HIGH (ref 70–99)
Glucose-Capillary: 210 mg/dL — ABNORMAL HIGH (ref 70–99)

## 2012-09-25 LAB — BASIC METABOLIC PANEL
BUN: 16 mg/dL (ref 6–23)
CO2: 27 mEq/L (ref 19–32)
Calcium: 9.3 mg/dL (ref 8.4–10.5)
Creatinine, Ser: 0.48 mg/dL — ABNORMAL LOW (ref 0.50–1.10)
Glucose, Bld: 113 mg/dL — ABNORMAL HIGH (ref 70–99)

## 2012-09-25 MED ORDER — POTASSIUM CHLORIDE CRYS ER 20 MEQ PO TBCR
60.0000 meq | EXTENDED_RELEASE_TABLET | Freq: Once | ORAL | Status: AC
  Administered 2012-09-25: 60 meq via ORAL
  Filled 2012-09-25: qty 3

## 2012-09-25 MED ORDER — ACETAMINOPHEN 650 MG RE SUPP: 650.0000 mg | Freq: Four times a day (QID) | RECTAL | Status: DC | PRN

## 2012-09-25 MED ORDER — ACETAMINOPHEN 325 MG PO TABS
650.0000 mg | ORAL_TABLET | Freq: Four times a day (QID) | ORAL | Status: DC | PRN
  Administered 2012-09-27: 650 mg via ORAL
  Filled 2012-09-25: qty 2

## 2012-09-25 MED ORDER — INSULIN NPH (HUMAN) (ISOPHANE) 100 UNIT/ML ~~LOC~~ SUSP: 10.0000 [IU] | Freq: Every day | SUBCUTANEOUS | Status: DC

## 2012-09-25 MED ORDER — ENOXAPARIN SODIUM 40 MG/0.4ML ~~LOC~~ SOLN
40.0000 mg | Freq: Every day | SUBCUTANEOUS | Status: DC
  Administered 2012-09-25 – 2012-09-27 (×3): 40 mg via SUBCUTANEOUS
  Filled 2012-09-25 (×3): qty 0.4

## 2012-09-25 MED ORDER — INSULIN NPH (HUMAN) (ISOPHANE) 100 UNIT/ML ~~LOC~~ SUSP
10.0000 [IU] | Freq: Every day | SUBCUTANEOUS | Status: DC
  Administered 2012-09-25: 10 [IU] via SUBCUTANEOUS

## 2012-09-25 MED ORDER — ONDANSETRON HCL 4 MG/2ML IJ SOLN: 4.0000 mg | Freq: Four times a day (QID) | INTRAMUSCULAR | Status: DC | PRN

## 2012-09-25 MED ORDER — INSULIN ASPART 100 UNIT/ML ~~LOC~~ SOLN
0.0000 [IU] | SUBCUTANEOUS | Status: DC
  Administered 2012-09-25: 11 [IU] via SUBCUTANEOUS
  Administered 2012-09-25: 3 [IU] via SUBCUTANEOUS
  Administered 2012-09-25: 5 [IU] via SUBCUTANEOUS
  Administered 2012-09-25: 3 [IU] via SUBCUTANEOUS
  Administered 2012-09-26: 2 [IU] via SUBCUTANEOUS
  Administered 2012-09-26: 3 [IU] via SUBCUTANEOUS
  Administered 2012-09-26: 5 [IU] via SUBCUTANEOUS
  Administered 2012-09-26: 15 [IU] via SUBCUTANEOUS
  Administered 2012-09-27: 2 [IU] via SUBCUTANEOUS
  Administered 2012-09-27: 5 [IU] via SUBCUTANEOUS
  Administered 2012-09-27: 3 [IU] via SUBCUTANEOUS
  Administered 2012-09-27: 2 [IU] via SUBCUTANEOUS

## 2012-09-25 MED ORDER — INSULIN NPH (HUMAN) (ISOPHANE) 100 UNIT/ML ~~LOC~~ SUSP
10.0000 [IU] | Freq: Every day | SUBCUTANEOUS | Status: DC
  Administered 2012-09-26 – 2012-09-27 (×2): 10 [IU] via SUBCUTANEOUS
  Filled 2012-09-25 (×2): qty 10

## 2012-09-25 MED ORDER — ONDANSETRON HCL 4 MG PO TABS: 4.0000 mg | ORAL_TABLET | Freq: Four times a day (QID) | ORAL | Status: DC | PRN

## 2012-09-25 MED ORDER — PNEUMOCOCCAL VAC POLYVALENT 25 MCG/0.5ML IJ INJ: 0.5000 mL | INJECTION | Freq: Once | INTRAMUSCULAR | Status: DC

## 2012-09-25 MED ORDER — INSULIN NPH (HUMAN) (ISOPHANE) 100 UNIT/ML ~~LOC~~ SUSP
10.0000 [IU] | Freq: Two times a day (BID) | SUBCUTANEOUS | Status: DC
  Administered 2012-09-25: 10 [IU] via SUBCUTANEOUS
  Filled 2012-09-25: qty 10

## 2012-09-25 MED ORDER — LORAZEPAM 2 MG/ML IJ SOLN
0.2500 mg | Freq: Once | INTRAMUSCULAR | Status: AC
  Administered 2012-09-25: 0.25 mg via INTRAVENOUS
  Filled 2012-09-25: qty 1

## 2012-09-25 NOTE — Progress Notes (Signed)
[facility] PROGRESS NOTE  WRENLEY SAYED RUE:[id number] DOB: 1945/10/09 DOA: 09/24/2012 PCP: Romero Belling, MD  Assessment/Plan: Diabetes mellitus type 2, uncontrolled  -pt was admitted on iv insulin via glucostabilizer, BG control improved and she met criteria to be transitioned to SQ insulin this am -NPH 10 units bidordered with SSI -Monitor CBGs today and if continue to have ok control, will plan dc in am Active Problems:  Dehydration  -continue hydration with IVF Noncompliance -DM education Hyponatremia -resolved with hydration  Code Status: Full code  Family Communication: Discussed with family at bedside  Disposition Plan/Anticipated LOS:TO home when stable    Consultants:  none  Procedures:  none  Antibiotics:  none  HPI/Subjective: States feeling much better this am, denies any complaints.  Objective: Filed Vitals:   09/24/12 2228 09/24/12 2330 09/25/12 0040 09/25/12 0436  BP: 107/55 120/77 127/65 118/71  Pulse:   107 104  Temp: 98.2 F (36.8 C) 98.4 F (36.9 C) 98.7 F (37.1 C) 98.2 F (36.8 C)  TempSrc: Oral Oral Oral Oral  Resp: 13 23 19 18   Height:   5\' 3"  (1.6 m)   Weight:   60.9 kg (134 lb 4.2 oz)   SpO2: 98% 96% 100% 99%    Intake/Output Summary (Last 24 hours) at 09/25/12 1355 Last data filed at 09/25/12 0900  Gross per 24 hour  Intake 704.88 ml  Output    400 ml  Net 304.88 ml   Filed Weights   09/25/12 0040  Weight: 60.9 kg (134 lb 4.2 oz)    Exam:   General: elderly female,alert and orientedx3 calm, in NAD  Cardiovascular: RRR, nl S1S2  Respiratory: CTAB  Abdomen: Soft +BS NT/ND  Extremities: no edema, no cyanosis  Data Reviewed: Basic Metabolic Panel:  Recent Labs Lab 09/24/12 2100 09/25/12 0356  NA 130* 137  K 3.8 3.3*  CL 93* 101  CO2 25 27  GLUCOSE 451* 113*  BUN 19 16  CREATININE 0.64 0.48*  CALCIUM 9.5 9.3   Liver Function Tests:  Recent Labs Lab 09/24/12 2100  AST 13  ALT 16  ALKPHOS  56  BILITOT 1.2  PROT 7.1  ALBUMIN 3.6   No results found for this basename: LIPASE, AMYLASE,  in the last 168 hours No results found for this basename: AMMONIA,  in the last 168 hours CBC:  Recent Labs Lab 09/24/12 2100  WBC 5.1  NEUTROABS 3.4  HGB 13.1  HCT 38.2  MCV 82.3  PLT 263   Cardiac Enzymes:  Recent Labs Lab 09/24/12 2100  TROPONINI <0.30   BNP (last 3 results) No results found for this basename: PROBNP,  in the last 8760 hours CBG:  Recent Labs Lab 09/25/12 0531 09/25/12 0634 09/25/12 0731 09/25/12 0821 09/25/12 1152  GLUCAP 113* 143* 161* 154* 333*    No results found for this or any previous visit (from the past 240 hour(s)).   Studies: No results found.  Scheduled Meds: . enoxaparin (LOVENOX) injection  40 mg Subcutaneous Daily  . insulin aspart  0-15 Units Subcutaneous Q4H  . [START ON 09/26/2012] insulin NPH  10 Units Subcutaneous QAC breakfast  . insulin NPH  10 Units Subcutaneous QHS  . potassium chloride  60 mEq Oral Once   Continuous Infusions: . sodium chloride    . sodium chloride 75 mL/hr at 09/25/12 0110  . dextrose 5 % and 0.45% NaCl 75 mL/hr at 09/25/12 0134    Principal Problem:   Diabetes mellitus type 2, uncontrolled  Active Problems:   Dehydration   Noncompliance    Time spent: 65    VIYUOH,ADELINE C  [facility] Pager [phone] If 7PM-7AM, please contact night-coverage at [email or web address], password [facility] 09/25/2012, 1:55 PM  LOS: 1 day

## 2012-09-25 NOTE — Progress Notes (Signed)
UR completed 

## 2012-09-26 LAB — BASIC METABOLIC PANEL
BUN: 16 mg/dL (ref 6–23)
Calcium: 8.7 mg/dL (ref 8.4–10.5)
Creatinine, Ser: 0.5 mg/dL (ref 0.50–1.10)
GFR calc Af Amer: >90 mL/min (ref >90)
GFR calc non Af Amer: >90 mL/min (ref >90)
Potassium: 4.4 mEq/L (ref 3.5–5.1)

## 2012-09-26 LAB — GLUCOSE, CAPILLARY
Glucose-Capillary: 74 mg/dL (ref 70–99)
Glucose-Capillary: 165 mg/dL — ABNORMAL HIGH (ref 70–99)
Glucose-Capillary: 210 mg/dL — ABNORMAL HIGH (ref 70–99)

## 2012-09-26 LAB — URINE CULTURE: Colony Count: 60000

## 2012-09-26 MED ORDER — LORAZEPAM 1 MG PO TABS
1.0000 mg | ORAL_TABLET | Freq: Once | ORAL | Status: AC
  Administered 2012-09-27: 1 mg via ORAL
  Filled 2012-09-26: qty 1

## 2012-09-26 MED ORDER — INSULIN NPH (HUMAN) (ISOPHANE) 100 UNIT/ML ~~LOC~~ SUSP
8.0000 [IU] | Freq: Every day | SUBCUTANEOUS | Status: DC
  Administered 2012-09-26: 8 [IU] via SUBCUTANEOUS

## 2012-09-26 NOTE — Progress Notes (Signed)
Clinical Social Work Department BRIEF PSYCHOSOCIAL ASSESSMENT 09/26/2012  Patient:  Dana [name], Dana [name]     Account Number:  [id number]     Admit date:  09/24/2012  Clinical Social Worker:  Dennison Bulla  Date/Time:  09/26/2012 12:00 N  Referred by:  Care Management  Date Referred:  09/26/2012 Referred for  SNF Placement   Other Referral:   Interview type:  Patient Other interview type:    PSYCHOSOCIAL DATA Living Status:  FAMILY Admitted from facility:   Level of care:   Primary support name:  Dana [name] Primary support relationship to patient:  SPOUSE Degree of support available:   Strong    CURRENT CONCERNS Current Concerns  Post-Acute Placement   Other Concerns:    SOCIAL WORK ASSESSMENT / PLAN CSW received referral from CM reporting that family was interested in SNF placement for patient. CSW reviewed chart and met with patient at bedside. Patient confused and unable to participate in assessment. CSW spoke with husband via phone.    CSW introduced myself and explained role. Prior to admission, patient was living at home with husband. Husband does not work and cares for patient throughout the day. Husband reports that he was interested in more information about SNF placement. CSW explained SNF process and search process. CSW spoke with husband regarding insurance copays and the need to get authorization from insurance to approve SNF stay. Husband is concerned about copays and reports he needs to discuss plans with family. Husband has CSW contact information and will call CSW in the morning with a decision.    CSW updated CM of possible HH needs if SNF is not an option. CSW will continue to follow.   Assessment/plan status:  Psychosocial Support/Ongoing Assessment of Needs Other assessment/ plan:   Information/referral to community resources:   SNF information    PATIENT'S/FAMILY'S RESPONSE TO PLAN OF CARE: Patient confused and unable to participate in assessment. Husband  engaged throughout assessment and appreciative of CSW time. Husband has CSW contact information and will call CSW on 09/27/12.        Coverage for [employer]

## 2012-09-26 NOTE — Progress Notes (Signed)
Inpatient Diabetes Program Recommendations  AACE/ADA: New Consensus Statement on Inpatient Glycemic Control (2013)  Target Ranges:  Prepandial:   less than 140 mg/dL      Peak postprandial:   less than 180 mg/dL (1-2 hours)      Critically ill patients:  140 - 180 mg/dL   Reason for Visit: Hyperglycemia and Hypoglycemia  Pt reports non-compliance with healthy CHO mod diet - drinks large amt of soda and juice and eats 1 meal/day.    Results for Dana, [name] (MRN [id number]) as of 09/26/2012 18:54  Ref. Range 09/25/2012 16:47 09/25/2012 20:21 09/26/2012 00:06 09/26/2012 00:48 09/26/2012 04:27 09/26/2012 07:41 09/26/2012 12:06 09/26/2012 16:21  Glucose-Capillary Latest Range: 70-99 mg/dL 409 (H) 811 (H) 62 (L) 140 (H) 121 (H) 74 165 (H) 210 (H)    Results for Dana, [name] (MRN [id number]) as of 09/26/2012 18:54  Ref. Range 04/05/2012 09:30  Hemoglobin A1C Latest Range: <5.7 % 15.8 (H)   Results for Dana, [name] (MRN [id number]) as of 09/26/2012 18:54  Ref. Range 09/26/2012 03:50  Sodium Latest Range: 135-145 mEq/L 138  Potassium Latest Range: 3.5-5.1 mEq/L 4.4  Chloride Latest Range: 96-112 mEq/L 106  CO2 Latest Range: 19-32 mEq/L 28  BUN Latest Range: 6-23 mg/dL 16  Creatinine Latest Range: 0.50-1.10 mg/dL 4.69  Calcium Latest Range: 8.4-10.5 mg/dL 8.7  GFR calc non Af Amer Latest Range: >90 mL/min >90  GFR calc Af Amer Latest Range: >90 mL/min >90  Glucose Latest Range: 70-99 mg/dL 629 (H)     Inpatient Diabetes Program Recommendations Insulin - Basal: Decrease NPH to 8 units bid Insulin - Meal Coverage: Add meal coverage insulin - Novolog 2 units tidwc if pt eats >50% meal Diet: Encourage pt to make healthy choices to control blood sugars - See RD progress note   Note: Needs updated HgbA1C to assess glycemic control prior to hospitalization.  Will follow up in am.  Thank you. Ailene Ards, RD, LDN, CDE Inpatient Diabetes Coordinator [phone]

## 2012-09-26 NOTE — Progress Notes (Signed)
Hypoglycemic Event  CBG: 62  Treatment: 4 oz Orange juice  Symptoms: Shakey  Follow-up CBG: Time:0040 CBG Result:140  Possible Reasons for Event: Decreased oral intake, New insulin regimen  Comments/MD notified: no    Dana [name]  Remember to initiate Hypoglycemia Order Set & complete

## 2012-09-26 NOTE — Progress Notes (Addendum)
[facility] PROGRESS NOTE  Dana [name] WUJ:[id number] DOB: March 24, 1946 DOA: 09/24/2012 PCP: Romero Belling, MD  Assessment/Plan: Diabetes mellitus type 2, uncontrolled  -pt was admitted on iv insulin via glucostabilizer, BG control improved and she met criteria to be transitioned to SQ insulin this am -NPH 10 units bidordered with SSI -will decrease NPH to outpt dose and follow Active Problems:  Dehydration  -continue hydration with IVF Noncompliance -DM education Hyponatremia -resolved with hydration H/O Dementia -PT recommending SNF>>SW consulted for placement  Code Status: Full code  Family Communication: no family at bedside  Disposition Plan/Anticipated LOS:TO SNF when satble    Consultants:  none  Procedures:  none  Antibiotics:  none  HPI/Subjective: Per nursing pt had hypoglycemic episode this am. Per nsg getting out bed, some confusion.  Objective: Filed Vitals:   09/25/12 1300 09/25/12 2023 09/26/12 0500 09/26/12 1309  BP: 129/75 106/71 130/68 124/63  Pulse: 87 97 82 92  Temp: 98.2 F (36.8 C) 97.3 F (36.3 C) 98.2 F (36.8 C) 98.6 F (37 C)  TempSrc: Oral Oral Oral Oral  Resp: 18 16 16 16   Height:      Weight:      SpO2: 100% 100% 100% 100%    Intake/Output Summary (Last 24 hours) at 09/26/12 1929 Last data filed at 09/26/12 1722  Gross per 24 hour  Intake   2002 ml  Output      0 ml  Net   2002 ml   Filed Weights   09/25/12 0040  Weight: 60.9 kg (134 lb 4.2 oz)    Exam:   General: elderly female,alert and orientedx2 calm, in NAD  Cardiovascular: RRR, nl S1S2  Respiratory: CTAB  Abdomen: Soft +BS NT/ND  Extremities: no edema, no cyanosis  Data Reviewed: Basic Metabolic Panel:  Recent Labs Lab 09/24/12 2100 09/25/12 0356 09/26/12 0350  NA 130* 137 138  K 3.8 3.3* 4.4  CL 93* 101 106  CO2 25 27 28   GLUCOSE 451* 113* 134*  BUN 19 16 16   CREATININE 0.64 0.48* 0.50  CALCIUM 9.5 9.3 8.7   Liver Function  Tests:  Recent Labs Lab 09/24/12 2100  AST 13  ALT 16  ALKPHOS 56  BILITOT 1.2  PROT 7.1  ALBUMIN 3.6   No results found for this basename: LIPASE, AMYLASE,  in the last 168 hours No results found for this basename: AMMONIA,  in the last 168 hours CBC:  Recent Labs Lab 09/24/12 2100  WBC 5.1  NEUTROABS 3.4  HGB 13.1  HCT 38.2  MCV 82.3  PLT 263   Cardiac Enzymes:  Recent Labs Lab 09/24/12 2100  TROPONINI <0.30   BNP (last 3 results) No results found for this basename: PROBNP,  in the last 8760 hours CBG:  Recent Labs Lab 09/26/12 0048 09/26/12 0427 09/26/12 0741 09/26/12 1206 09/26/12 1621  GLUCAP 140* 121* 74 165* 210*    Recent Results (from the past 240 hour(s))  URINE CULTURE     Status: None   Collection Time    09/24/12 10:03 PM      Result Value Range Status   Specimen Description URINE, CLEAN CATCH   Final   Special Requests NONE   Final   Culture  Setup Time 09/25/2012 16:12   Final   Colony Count 60,000 COLONIES/ML   Final   Culture     Final   Value: Multiple bacterial morphotypes present, none predominant. Suggest appropriate recollection if clinically indicated.   Report Status 09/26/2012  FINAL   Final     Studies: No results found.  Scheduled Meds: . enoxaparin (LOVENOX) injection  40 mg Subcutaneous Daily  . insulin aspart  0-15 Units Subcutaneous Q4H  . insulin NPH  10 Units Subcutaneous QAC breakfast  . insulin NPH  8 Units Subcutaneous QHS   Continuous Infusions: . sodium chloride 75 mL/hr at 09/26/12 9562    Principal Problem:   Diabetes mellitus type 2, uncontrolled Active Problems:   Dehydration   Noncompliance    Time spent: 39    VIYUOH,ADELINE C  [facility] Pager [phone] If 7PM-7AM, please contact night-coverage at [email or web address], password [facility] 09/26/2012, 7:29 PM  LOS: 2 days

## 2012-09-26 NOTE — Progress Notes (Signed)
Patient evaluated for long-term disease management services with [facility] Care Management Program. Spoke with Mrs Krizek at bedside. However,she is very confused at this time. Spoke with patient's husband on the home phone number and he states he is not sure what the discharge plan will be. States he will need to speak with his daughter before making a decision. Appears LCSW has already called and spoke with husband regarding SNF. Made husband aware that this writer will leave [facility] Care Management brochure at patient's bedside along with contact information.  Raiford Noble, RN,BSN, [facility] Liaison, [phone]

## 2012-09-26 NOTE — Progress Notes (Signed)
  RD consulted for nutrition education regarding diabetes.   Lab Results  Component Value Date   HGBA1C 15.8* 04/05/2012    RD provided "Carbohydrate Counting for People with Diabetes" handout from the Academy of Nutrition and Dietetics. Discussed different food groups and their effects on blood sugar, emphasizing carbohydrate-containing foods. Provided list of carbohydrates and recommended serving sizes of common foods.  Discussed importance of controlled and consistent carbohydrate intake throughout the day. Provided examples of ways to balance meals/snacks. Teach back method used.  Pt reports she drinks regular Cola at home and drinks juice. Pt reports she only eats 1 meal/day, however later on said she eats snacks at other mealtimes. Pt confused during visit, not knowing she was in the hospital, and continually asking where her family was. No family present. Pt was able to understand some of the diabetic nutrition content and give examples of carbohydrate sources. Teach pt on how to read a food label for grams of carbohydrate and discussed serving sizes. Pt identified areas of her diet that she could improve on. Will order outpatient DM education follow up.   Expect fair compliance.  Body mass index is 23.79 kg/(m^2). Pt meets criteria for healthy weight based on current BMI.  Current diet order is carbohydrate modified, patient is consuming approximately 75-100% of meals at this time. Labs and medications reviewed.   Levon Hedger MS, RD, LDN [phone] Pager [phone] After Hours Pager

## 2012-09-26 NOTE — Evaluation (Signed)
Occupational Therapy Evaluation Patient Details Name: SHANETHA BRADHAM MRN: [id number] DOB: 1945-10-28 Today's Date: 09/26/2012 Time: 4098-1191 OT Time Calculation (min): 27 min  OT Assessment / Plan / Recommendation Clinical Impression  This 67 year old female was admitted for high blood sugar.  She has a PMH significant for type I DM, anxiety, retinopathy, and OA.  Pt is limited by cognition and will benefit from skilled OT to increase safety and independence with adls.      OT Assessment  Patient needs continued OT Services    Follow Up Recommendations  SNF;Supervision/Assistance - 24 hour    Barriers to Discharge      Equipment Recommendations   (possibly tub/shower seat)    Recommendations for Other Services    Frequency  Min 2X/week    Precautions / Restrictions Precautions Precautions: Fall Restrictions Weight Bearing Restrictions: No   Pertinent Vitals/Pain No pain    ADL  Grooming: Supervision/safety;Wash/dry hands (lotion sitting, cues to do both sides) Where Assessed - Grooming: Unsupported sitting;Supported standing (lotion sit; hands stand at sink) Upper Body Bathing: Simulated;Supervision/safety Where Assessed - Upper Body Bathing: Unsupported sitting Lower Body Bathing: Supervision/safety Where Assessed - Lower Body Bathing: Unsupported sit to stand Upper Body Dressing: Performed;Minimal assistance Where Assessed - Upper Body Dressing: Unsupported sitting Lower Body Dressing: Performed;Supervision/safety Where Assessed - Lower Body Dressing: Unsupported sit to stand Toilet Transfer: Performed;Supervision/safety Toilet Transfer Method: Sit to Barista: Comfort height toilet;Grab bars Toileting - Architect and Hygiene: Simulated;Supervision/safety Where Assessed - Engineer, mining and Hygiene: Sit to stand from 3-in-1 or toilet Equipment Used: Gait belt Transfers/Ambulation Related to ADLs: ambulated to  bathroom with supervision.  Pt very distractable ADL Comments: Decreased attention to task.  Got long gown into toilet: cues to continue task for completeness    OT Diagnosis: Cognitive deficits  OT Problem List: Decreased strength;Decreased cognition;Decreased safety awareness OT Treatment Interventions: Self-care/ADL training;DME and/or AE instruction;Cognitive remediation/compensation;Patient/family education   OT Goals Acute Rehab OT Goals OT Goal Formulation: Patient unable to participate in goal setting Time For Goal Achievement: 10/10/12 Potential to Achieve Goals: Good Miscellaneous OT Goals Miscellaneous OT Goal #1: Pt will complete adl routine, including gathering of supplies with supervision and no more than 3 vcs total for safety and attention to task OT Goal: Miscellaneous Goal #1 - Progress: Goal set today Miscellaneous OT Goal #2: pt will ambulate to bathroom and complete toileting at supervision level with no more than 1 vc for safety OT Goal: Miscellaneous Goal #2 - Progress: Goal set today  Visit Information  Last OT Received On: 09/26/12 Assistance Needed: +1    Subjective Data  Subjective: Would you see if my husband is in that next room Patient Stated Goal: none stated but agreeable to OT   Prior Functioning     Home Living Lives With: Spouse Additional Comments: did not ask home/plof questions.  Pt not oriented Prior Function Comments: unknown Communication Communication: No difficulties Dominant Hand: Right         Vision/Perception     Cognition  Cognition Overall Cognitive Status: Impaired Area of Impairment: Attention;Memory;Safety/judgement;Problem solving Arousal/Alertness: Awake/alert Orientation Level: Disoriented to;Place;Time;Situation Behavior During Session: WFL for tasks performed (safety) Current Attention Level: Sustained (distracts self) Memory:  (asks same questions over and over) Safety/Judgement: Decreased awareness of  safety precautions;Decreased safety judgement for tasks assessed;Decreased awareness of need for assistance Problem Solving: decreased awareness of potential problems    Extremity/Trunk Assessment Right Upper Extremity Assessment RUE ROM/Strength/Tone: [facility]  for tasks assessed Left Upper Extremity Assessment LUE ROM/Strength/Tone: Within functional levels     Mobility Bed Mobility Bed Mobility: Sit to Supine Sit to Supine: 7: Independent Transfers Transfers: Sit to Stand Sit to Stand: 5: Supervision Stand to Sit: 5: Supervision Details for Transfer Assistance: cues for safety     Exercise     Balance     End of Session OT - End of Session Activity Tolerance: Patient tolerated treatment well Patient left: in chair;with call bell/phone within reach;with chair alarm set Nurse Communication:  (frequent checks)  GO     SPENCER,MARYELLEN 09/26/2012, 3:02 PM Marica Otter, OTR/L [phone] 09/26/2012

## 2012-09-27 LAB — GLUCOSE, CAPILLARY

## 2012-09-27 NOTE — Progress Notes (Signed)
CSW followed up with pt and pt husband at bedside re: SNF placement.  CSW inquired with pt husband if he was able to speak with pt family re: pt husband concern about copayments at SNF.   Pt husband reports that he feels that he can manage pt at home as he will be able to provide 24 hour supervision for pt.   CSW discussed with pt and pt spouse re: home health services.  Pt was hesitant about Home Health RN, but agreed after discussion that [facility] RN is there to assist with her diabetes management.   RNCM made aware and offered choice.for [facility] agencies.  CSW discussed with pt and pt husband that per MD pt is medically stable for discharge today and pt and pt husband are ageeable to pt returning home today.  RNCM stated that she would notify MD that pt and pt spouse agreeable to pt returning home today with [facility] services.  No further social work needs identified at this time.  CSW signing off.   Jacklynn Lewis, MSW, LCSWA  Clinical Social Work [phone]

## 2012-09-27 NOTE — Evaluation (Signed)
Physical Therapy Evaluation Patient Details Name: Dana [name] MRN: [id number] DOB: [birth year] Today's Date: 09/27/2012 Time: 1478-2956 PT Time Calculation (min): 14 min  PT Assessment / Plan / Recommendation Clinical Impression  Pt admitted for uncontrolled DM type II and dehydration.  No family present and pt poor historian due to dementia.  Pt will likely need assist upon d/c for safety however no skilled f/u PT needs at this time.  Pt would benefit from acute PT services in order to improve safety awareness and ambulation with RW prior to d/c.  Pt currently min assist for occasional LOB; recommend 24/7 assist upon d/c.    PT Assessment  Patient needs continued PT services    Follow Up Recommendations  No PT follow up;Supervision/Assistance - 24 hour    Does the patient have the potential to tolerate intense rehabilitation      Barriers to Discharge        Equipment Recommendations  Rolling walker with 5" wheels    Recommendations for Other Services     Frequency Min 2X/week    Precautions / Restrictions Precautions Precautions: Fall   Pertinent Vitals/Pain n/a     Mobility  Bed Mobility Bed Mobility: Supine to Sit Supine to Sit: 7: Independent Transfers Transfers: Sit to Stand;Stand to Sit Sit to Stand: 4: Min assist;With upper extremity assist Stand to Sit: 5: Supervision;With upper extremity assist Details for Transfer Assistance: assist to steady upon rise, verbal cues to bring RW back to chair upon sitting Ambulation/Gait Ambulation/Gait Assistance: 4: Min assist Ambulation Distance (Feet): 250 Feet Assistive device: Rolling walker Ambulation/Gait Assistance Details: pt unsteady in room so given RW for hallway with occasional verbal cue to keep RW close to body, assist for balance with turning around Gait Pattern: Step-through pattern Gait velocity: decreased    Exercises     PT Diagnosis: Difficulty walking  PT Problem List: Decreased balance;Decreased  mobility;Decreased safety awareness;Decreased knowledge of use of DME PT Treatment Interventions: DME instruction;Gait training;Functional mobility training;Therapeutic activities;Therapeutic exercise;Patient/family education;Neuromuscular re-education;Balance training   PT Goals Acute Rehab PT Goals PT Goal Formulation: With patient Time For Goal Achievement: 09/27/12 Potential to Achieve Goals: Good Pt will go Sit to Stand: with supervision PT Goal: Sit to Stand - Progress: Goal set today Pt will go Stand to Sit: with supervision PT Goal: Stand to Sit - Progress: Goal set today Pt will Ambulate: >150 feet;with supervision;with least restrictive assistive device PT Goal: Ambulate - Progress: Goal set today Pt will Perform Home Exercise Program: with supervision, verbal cues required/provided PT Goal: Perform Home Exercise Program - Progress: Goal set today  Visit Information  Last PT Received On: 09/27/12 Assistance Needed: +1    Subjective Data  Subjective: Am I going too slow? (asked repeatedly)   Prior Functioning  Home Living Lives With: Spouse Additional Comments: poor historian, hx dementia Prior Function Comments: Pt reports independent (? due to cognition) Communication Communication: No difficulties    Cognition  Cognition Overall Cognitive Status: No family/caregiver present to determine baseline cognitive functioning Area of Impairment: Memory;Safety/judgement Behavior During Session: [facility] for tasks performed Safety/Judgement: Decreased awareness of safety precautions;Decreased safety judgement for tasks assessed;Decreased awareness of need for assistance    Extremity/Trunk Assessment Right Lower Extremity Assessment RLE ROM/Strength/Tone: [facility] for tasks assessed Left Lower Extremity Assessment LLE ROM/Strength/Tone: [facility] for tasks assessed   Balance    End of Session PT - End of Session Activity Tolerance: Patient tolerated treatment well Patient left: in  chair;with call bell/phone within reach;Other (  comment) (with sitter)  GP     Maida Sale E 09/27/2012, 11:56 AM Zenovia Jarred, PT, DPT 09/27/2012 Pager: [phone]

## 2012-09-27 NOTE — Progress Notes (Signed)
Pt is to be discharged home today. Pt is in NAD, IV is out, all paperwork has been reviewed/discussed with patient, and there are no questions/concerns at this time. Assessment is unchanged from this morning. Pt is to be accompanied downstairs by staff and family via wheelchair.  

## 2012-09-27 NOTE — Discharge Summary (Signed)
Physician Discharge Summary  Dana [name] ZOX:[id number] DOB: 03/28/1946 DOA: 09/24/2012  PCP: Dana [name]  Admit date: 09/24/2012 Discharge date: 09/27/2012  Time spent: >30 minutes  Recommendations for Outpatient Follow-up:  Dana [name] in one week, call for appointment upon discharge Discharge Diagnoses:  Principal Problem:   Diabetes mellitus type 2, uncontrolled Active Problems:   Dehydration   Noncompliance   Discharge Condition: Improved/stable  Diet recommendation: Modified carbohydrate  Filed Weights   09/25/12 0040  Weight: 60.9 kg (134 lb 4.2 oz)    History of present illness:  67 year old woman presented via EMS for history of hyperglycemia and combativeness. Blood sugar noted behind, started on insulin infusion and referred for admission. Knowledge deficit and education needs were identified.  History obtained from patient and husband at bedside. Blood sugar has been running "high". Today she was somewhat agitated, dizzy and noted to have high blood sugar. The patient denies any complaints and has difficulty articulating why she is here and appears to have some dementia. She is unable to articulate her insulin regimen or recall the name. It does not appear that husband is fluent with her regimen either. Education on diet requested. Husband does not report any other particular medical problems.   Hospital Course:  Diabetes mellitus type 2, uncontrolled  -As discussed above, Upon admission patient was placed on on iv insulin via glucostabilizer, her BG control improved and she met criteria to be transitioned to SQ insulin and this was done. -Initially her NPH dose was increased to 10 units in twice a day but patient on followup the next day developed a hypoglycemic episode and so this has been decreased  back to her preadmission dose of 8 units twice a day -The impression was that the hyperglycemia/poor control on admission was secondary to noncompliance. Diabetes  quantity it was consulted for inpatient education, it is noted that patient does have dementia and so the family are mainly has to help with compliance. Also patient has been set up for home health RN to assist with diabetes monitoring and medication management upon discharge -Blood glucose control improved the hospital, she is to continue her preadmission regimen and followup with Dana [name] Active Problems:  Dehydration  -continue hydration with IVF  Noncompliance  -DM education as above  Hyponatremia  -resolved with hydration  H/O Dementia  -PT  OT was consulted and saw patient, PT did not recommend any further skilled PT needs. Case manager/social worker spoke with family (in the light of OT recommendations) and they agreed to take patient home.   Procedures:  none  Consultations:  none  Discharge Exam: Filed Vitals:   09/26/12 1309 09/26/12 2127 09/27/12 0429 09/27/12 1309  BP: 124/63 124/104 142/74 119/75  Pulse: 92 110 89 92  Temp: 98.6 F (37 [name]) 98.5 F (36.9 [name]) 97.3 F (36.3 [name]) 98.4 F (36.9 [name])  TempSrc: Oral Oral Axillary Oral  Resp: 16 20 20 18   Height:      Weight:      SpO2: 100% 100% 100% 100%   Exam:  General: elderly female,alert and orientedx2 calm, in NAD  Cardiovascular: RRR, nl S1S2  Respiratory: CTAB  Abdomen: Soft +BS NT/ND  Extremities: no edema, no cyanosis  Discharge Instructions  Discharge Orders   Future Orders Complete By Expires     Amb Referral to Nutrition and Diabetic E  As directed     Comments:      Uncontrolled DM HbA1c 15.8%    Diet Carb Modified  As directed     Increase activity slowly  As directed         Medication List    TAKE these medications       insulin aspart 100 UNIT/ML injection  Commonly known as:  novoLOG  Inject 3 Units into the skin 3 (three) times daily with meals.     insulin NPH 100 UNIT/ML injection  Commonly known as:  HUMULIN N,NOVOLIN N  Inject 8 Units into the skin 2 (two) times daily.      multivitamin with minerals Tabs  Take 1 tablet by mouth every morning.           Follow-up Information   Follow up with Dana [name] In 1 week. (call for appt upon discharge)    Contact information:   [address] [place] [address] [phone]        The results of significant diagnostics from this hospitalization (including imaging, microbiology, ancillary and laboratory) are listed below for reference.    Significant Diagnostic Studies: No results found.  Microbiology: Recent Results (from the past 240 hour(s))  URINE CULTURE     Status: None   Collection Time    09/24/12 10:03 PM      Result Value Range Status   Specimen Description URINE, CLEAN CATCH   Final   Special Requests NONE   Final   Culture  Setup Time 09/25/2012 16:12   Final   Colony Count 60,000 COLONIES/ML   Final   Culture     Final   Value: Multiple bacterial morphotypes present, none predominant. Suggest appropriate recollection if clinically indicated.   Report Status 09/26/2012 FINAL   Final     Labs: Basic Metabolic Panel:  Recent Labs Lab 09/24/12 2100 09/25/12 0356 09/26/12 0350  NA 130* 137 138  K 3.8 3.3* 4.4  CL 93* 101 106  CO2 25 27 28   GLUCOSE 451* 113* 134*  BUN 19 16 16   CREATININE 0.64 0.48* 0.50  CALCIUM 9.5 9.3 8.7   Liver Function Tests:  Recent Labs Lab 09/24/12 2100  AST 13  ALT 16  ALKPHOS 56  BILITOT 1.2  PROT 7.1  ALBUMIN 3.6   No results found for this basename: LIPASE, AMYLASE,  in the last 168 hours No results found for this basename: AMMONIA,  in the last 168 hours CBC:  Recent Labs Lab 09/24/12 2100  WBC 5.1  NEUTROABS 3.4  HGB 13.1  HCT 38.2  MCV 82.3  PLT 263   Cardiac Enzymes:  Recent Labs Lab 09/24/12 2100  TROPONINI <0.30   BNP: BNP (last 3 results) No results found for this basename: PROBNP,  in the last 8760 hours CBG:  Recent Labs Lab 09/27/12 0008 09/27/12 0424 09/27/12 0820  09/27/12 1125 09/27/12 1620  GLUCAP 137* 96 153* 218* 146*       Signed:  VIYUOH,ADELINE [name]  [facility] 09/27/2012, 6:35 PM

## 2012-09-27 NOTE — Progress Notes (Signed)
Cm spoke with CSW concerning pt's PT/OT eval. OT recommends SNF vs PT recommendations of no further PT follow-up. CM spoke with patient at bedside with spouse present. Per pt and spouse choice pt to discharge home with [facility] services. Per pt choice [facility] to provide [facility] services upon discharge. No DME need stated. [facility] rep Norberta Keens informed of pt referral.    Dana [name] [phone]

## 2012-09-30 ENCOUNTER — Telehealth: Payer: Self-pay | Admitting: Endocrinology

## 2012-09-30 NOTE — Telephone Encounter (Signed)
Judeth Cornfield from [facility] called again regarding patient's extremely high blood sugars,  and I relayed the information that the patient needs to proceed to the ER per Dr. George Hugh note.  Judeth Cornfield verbalized that she would advise the patient to seek emergency care at the hospital.

## 2012-09-30 NOTE — Telephone Encounter (Signed)
Judeth Cornfield, a home health nurse with [facility] called regarding the patient's blood sugar readings.  The meter is reading her blood sugar level as "HIGH" - the nurse is requesting to increase the patient's novolog or novolin to get patient to a more manageable level.  Please call Judeth Cornfield at [phone].

## 2012-09-30 NOTE — Telephone Encounter (Signed)
This patient has been told repeatedly to come in for appts, and has chosen to not do so.  Please advise pt to go to ER.

## 2012-10-07 ENCOUNTER — Ambulatory Visit: Payer: Medicare Other | Admitting: Endocrinology

## 2012-10-07 DIAGNOSIS — Z0289 Encounter for other administrative examinations: Secondary | ICD-10-CM

## 2012-10-13 ENCOUNTER — Ambulatory Visit (INDEPENDENT_AMBULATORY_CARE_PROVIDER_SITE_OTHER): Payer: Medicare Other | Admitting: Endocrinology

## 2012-10-13 ENCOUNTER — Encounter: Payer: Self-pay | Admitting: Endocrinology

## 2012-10-13 VITALS — BP 134/80 | HR 94 | Wt 142.0 lb

## 2012-10-13 DIAGNOSIS — E1165 Type 2 diabetes mellitus with hyperglycemia: Secondary | ICD-10-CM

## 2012-10-13 DIAGNOSIS — M1990 Unspecified osteoarthritis, unspecified site: Secondary | ICD-10-CM

## 2012-10-13 DIAGNOSIS — I10 Essential (primary) hypertension: Secondary | ICD-10-CM

## 2012-10-13 DIAGNOSIS — F3289 Other specified depressive episodes: Secondary | ICD-10-CM

## 2012-10-13 DIAGNOSIS — G309 Alzheimer's disease, unspecified: Secondary | ICD-10-CM

## 2012-10-13 DIAGNOSIS — F329 Major depressive disorder, single episode, unspecified: Secondary | ICD-10-CM

## 2012-10-13 DIAGNOSIS — F0280 Dementia in other diseases classified elsewhere without behavioral disturbance: Secondary | ICD-10-CM

## 2012-10-13 DIAGNOSIS — F0390 Unspecified dementia without behavioral disturbance: Secondary | ICD-10-CM

## 2012-10-13 MED ORDER — CITALOPRAM HYDROBROMIDE 20 MG PO TABS: 20.0000 mg | ORAL_TABLET | Freq: Every day | ORAL | Status: DC

## 2012-10-13 MED ORDER — INSULIN NPH (HUMAN) (ISOPHANE) 100 UNIT/ML ~~LOC~~ SUSP: 14.0000 [IU] | SUBCUTANEOUS | Status: DC

## 2012-10-13 NOTE — Patient Instructions (Addendum)
check your blood sugar twice a day.  vary the time of day when you check, between before the 3 meals, and at bedtime.  also check if you have symptoms of your blood sugar being too high or too low.  please keep a record of the readings and bring it to your next appointment here.  please call us sooner if your blood sugar goes below 70, or if you have a lot of readings over 200. Refer to a psychiatry specialist.  you will receive a phone call, about a day and time for an appointment i have sent a prescription to your pharmacy, for a pill against depression.  Please take this until you see the specialist. Please change both insulins to just the NPH ("N"), 14 units each morning. Please come back for a regular physical appointment in 2 weeks.

## 2012-10-13 NOTE — Progress Notes (Signed)
  Subjective:    Patient ID: Dana [name], female    DOB: 1945/09/01, 67 y.o.   MRN: [id number]  HPI The state of at least three ongoing medical problems is addressed today, with interval history of each noted here: She returns for f/u of type 2 DM (dx'ed 1987; no known complications; therapy has been limited by severe noncompliance and dementia; she was admitted in march of 2014, with severe hyperglycemia).  She is seen today for the first time in more than 1 year.  She says she is feeling better since she was d/c'ed from the hospital.  Pt says she gives her own insulin, and checks her own cbg's.  Husband says pt does not take insulin as rx'ed.  no cbg record, but states cbg's are well-controlled.    HTN: she denies sob.  Pt states depression persists.  Husband agrees.    Past Medical History  Diagnosis Date  . ALLERGIC RHINITIS 02/08/2007  . ANXIETY 02/08/2007  . Cough 08/15/2008  . DEPRESSION 02/08/2007  . DIABETES MELLITUS, TYPE I 02/08/2007  . Headache 11/06/2008  . HYPERCHOLESTEROLEMIA 11/05/2008  . HYPERTENSION 02/08/2007  . URINARY INCONTINENCE 02/08/2007  . Dyslipidemia   . Non-ulcer dyspepsia   . Leukopenia   . DM retinopathy     background  . 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 Dementia     Past Surgical History  Procedure Laterality Date  . Total knee arthroplasty  1990    left  . Panendoscopy  10/27/1999  . Stress cardiolite  12/14/2003  . Electrocardiogram  05/17/2006    History   Social History  . Marital Status: Married    Spouse Name: N/A    Number of Children: N/A  . Years of Education: N/A   Occupational History  . Retired    Social History Main Topics  . Smoking status: Never Smoker   . Smokeless tobacco: Never Used  . Alcohol Use: No  . Drug Use: No  . Sexually Active: No   Other Topics Concern  . Not on file   Social History Narrative  . No narrative on file    Current Outpatient Prescriptions on File Prior to Visit  Medication  Sig Dispense Refill  . Multiple Vitamin (MULTIVITAMIN WITH MINERALS) TABS Take 1 tablet by mouth every morning.       No current facility-administered medications on file prior to visit.    Allergies  Allergen Reactions  . Aspirin Nausea Only  . Codeine Nausea Only    Family History  Problem Relation Age of Onset  . Alcohol abuse Son   . Cancer Neg Hx     no cancer in immediate family    BP 134/80  Pulse 94  Wt 142 lb (64.411 kg)  BMI 25.16 kg/m2  SpO2 98%  Review of Systems She has gained weight.  denies hypoglycemia    Objective:   Physical Exam VITAL SIGNS:  See vs page GENERAL: no distress PSYCH: Alert, cooperative, and oriented x 2.  She does not know what year it is.  Does not appear anxious nor depressed.      Assessment & Plan:  DM: she needs simple regimen, due to dementia Depression complicates dementia HTN: well-controlled

## 2012-10-14 ENCOUNTER — Encounter: Payer: Self-pay | Admitting: Endocrinology

## 2012-10-18 ENCOUNTER — Ambulatory Visit: Payer: Medicare Other | Admitting: *Deleted

## 2012-10-28 ENCOUNTER — Encounter: Payer: Medicare Other | Admitting: Endocrinology

## 2012-10-28 DIAGNOSIS — Z0289 Encounter for other administrative examinations: Secondary | ICD-10-CM

## 2012-11-03 ENCOUNTER — Ambulatory Visit: Payer: Medicare Other | Admitting: Endocrinology

## 2012-11-09 ENCOUNTER — Ambulatory Visit: Payer: Medicare Other | Admitting: Endocrinology

## 2012-11-09 DIAGNOSIS — Z0289 Encounter for other administrative examinations: Secondary | ICD-10-CM

## 2012-11-14 ENCOUNTER — Ambulatory Visit: Payer: Medicare Other | Admitting: *Deleted

## 2012-11-25 ENCOUNTER — Ambulatory Visit: Payer: Medicare Other | Admitting: Endocrinology

## 2012-11-25 DIAGNOSIS — Z0289 Encounter for other administrative examinations: Secondary | ICD-10-CM

## 2013-01-16 ENCOUNTER — Other Ambulatory Visit: Payer: Self-pay

## 2013-01-16 MED ORDER — INSULIN NPH (HUMAN) (ISOPHANE) 100 UNIT/ML ~~LOC~~ SUSP: 14.0000 [IU] | SUBCUTANEOUS | Status: DC

## 2013-02-21 ENCOUNTER — Other Ambulatory Visit: Payer: Self-pay | Admitting: Endocrinology

## 2013-02-28 ENCOUNTER — Ambulatory Visit: Payer: Medicare Other | Admitting: Endocrinology

## 2013-03-07 ENCOUNTER — Encounter (HOSPITAL_COMMUNITY): Payer: Self-pay | Admitting: *Deleted

## 2013-03-07 ENCOUNTER — Emergency Department (HOSPITAL_COMMUNITY): Payer: Medicare Other

## 2013-03-07 DIAGNOSIS — F3289 Other specified depressive episodes: Secondary | ICD-10-CM | POA: Diagnosis present

## 2013-03-07 DIAGNOSIS — F329 Major depressive disorder, single episode, unspecified: Secondary | ICD-10-CM | POA: Diagnosis present

## 2013-03-07 DIAGNOSIS — I509 Heart failure, unspecified: Secondary | ICD-10-CM | POA: Diagnosis present

## 2013-03-07 DIAGNOSIS — Z96659 Presence of unspecified artificial knee joint: Secondary | ICD-10-CM

## 2013-03-07 DIAGNOSIS — E1139 Type 2 diabetes mellitus with other diabetic ophthalmic complication: Secondary | ICD-10-CM | POA: Diagnosis present

## 2013-03-07 DIAGNOSIS — Z9119 Patient's noncompliance with other medical treatment and regimen: Secondary | ICD-10-CM

## 2013-03-07 DIAGNOSIS — Z91199 Patient's noncompliance with other medical treatment and regimen due to unspecified reason: Secondary | ICD-10-CM

## 2013-03-07 DIAGNOSIS — I447 Left bundle-branch block, unspecified: Secondary | ICD-10-CM | POA: Diagnosis present

## 2013-03-07 DIAGNOSIS — F0280 Dementia in other diseases classified elsewhere without behavioral disturbance: Secondary | ICD-10-CM | POA: Diagnosis present

## 2013-03-07 DIAGNOSIS — F411 Generalized anxiety disorder: Secondary | ICD-10-CM | POA: Diagnosis present

## 2013-03-07 DIAGNOSIS — E11319 Type 2 diabetes mellitus with unspecified diabetic retinopathy without macular edema: Secondary | ICD-10-CM | POA: Diagnosis present

## 2013-03-07 DIAGNOSIS — Z794 Long term (current) use of insulin: Secondary | ICD-10-CM

## 2013-03-07 DIAGNOSIS — E876 Hypokalemia: Secondary | ICD-10-CM | POA: Diagnosis present

## 2013-03-07 DIAGNOSIS — F0281 Dementia in other diseases classified elsewhere with behavioral disturbance: Secondary | ICD-10-CM | POA: Diagnosis present

## 2013-03-07 DIAGNOSIS — I10 Essential (primary) hypertension: Secondary | ICD-10-CM | POA: Diagnosis present

## 2013-03-07 DIAGNOSIS — E785 Hyperlipidemia, unspecified: Secondary | ICD-10-CM | POA: Diagnosis present

## 2013-03-07 DIAGNOSIS — Z79899 Other long term (current) drug therapy: Secondary | ICD-10-CM

## 2013-03-07 DIAGNOSIS — Z7982 Long term (current) use of aspirin: Secondary | ICD-10-CM

## 2013-03-07 DIAGNOSIS — G309 Alzheimer's disease, unspecified: Secondary | ICD-10-CM | POA: Diagnosis present

## 2013-03-07 DIAGNOSIS — M1990 Unspecified osteoarthritis, unspecified site: Secondary | ICD-10-CM | POA: Diagnosis present

## 2013-03-07 DIAGNOSIS — F02818 Dementia in other diseases classified elsewhere, unspecified severity, with other behavioral disturbance: Secondary | ICD-10-CM | POA: Diagnosis present

## 2013-03-07 DIAGNOSIS — I5021 Acute systolic (congestive) heart failure: Principal | ICD-10-CM | POA: Diagnosis present

## 2013-03-07 DIAGNOSIS — Z8249 Family history of ischemic heart disease and other diseases of the circulatory system: Secondary | ICD-10-CM

## 2013-03-07 DIAGNOSIS — I428 Other cardiomyopathies: Secondary | ICD-10-CM | POA: Diagnosis present

## 2013-03-07 LAB — PRO B NATRIURETIC PEPTIDE: Pro B Natriuretic peptide (BNP): 1008 pg/mL — ABNORMAL HIGH (ref 0–125)

## 2013-03-07 LAB — POCT I-STAT TROPONIN I: Troponin i, poc: 0.02 ng/mL (ref 0.00–0.08)

## 2013-03-07 LAB — BASIC METABOLIC PANEL
CO2: 30 mEq/L (ref 19–32)
Chloride: 102 mEq/L (ref 96–112)
Glucose, Bld: 376 mg/dL — ABNORMAL HIGH (ref 70–99)
Potassium: 4.2 mEq/L (ref 3.5–5.1)
Sodium: 140 mEq/L (ref 135–145)

## 2013-03-07 LAB — CBC
Hemoglobin: 13.5 g/dL (ref 12.0–15.0)
MCH: 28.5 pg (ref 26.0–34.0)
Platelets: 325 10*3/uL (ref 150–400)
RBC: 4.74 MIL/uL (ref 3.87–5.11)
WBC: 3.9 10*3/uL — ABNORMAL LOW (ref 4.0–10.5)

## 2013-03-07 NOTE — ED Notes (Signed)
Pt c/o of intermittent CP of the last week. Today Right sided non radiating CP with SOB. Denies Nausea, vomiting, radiation, diaphoresis. Pain exacerbated by nothing, pain relieved by nothing.

## 2013-03-08 ENCOUNTER — Inpatient Hospital Stay (HOSPITAL_COMMUNITY)
Admission: EM | Admit: 2013-03-08 | Discharge: 2013-03-16 | DRG: 292 | Disposition: A | Discharge status: Home / Self Care | Payer: Medicare Other | Attending: Internal Medicine | Admitting: Internal Medicine

## 2013-03-08 ENCOUNTER — Encounter (HOSPITAL_COMMUNITY): Payer: Self-pay | Admitting: Internal Medicine

## 2013-03-08 DIAGNOSIS — R609 Edema, unspecified: Secondary | ICD-10-CM

## 2013-03-08 DIAGNOSIS — Z79899 Other long term (current) drug therapy: Secondary | ICD-10-CM

## 2013-03-08 DIAGNOSIS — I10 Essential (primary) hypertension: Secondary | ICD-10-CM

## 2013-03-08 DIAGNOSIS — E7800 Pure hypercholesterolemia, unspecified: Secondary | ICD-10-CM

## 2013-03-08 DIAGNOSIS — F0281 Dementia in other diseases classified elsewhere with behavioral disturbance: Secondary | ICD-10-CM

## 2013-03-08 DIAGNOSIS — E876 Hypokalemia: Secondary | ICD-10-CM

## 2013-03-08 DIAGNOSIS — J309 Allergic rhinitis, unspecified: Secondary | ICD-10-CM

## 2013-03-08 DIAGNOSIS — R410 Disorientation, unspecified: Secondary | ICD-10-CM

## 2013-03-08 DIAGNOSIS — R059 Cough, unspecified: Secondary | ICD-10-CM

## 2013-03-08 DIAGNOSIS — IMO0002 Reserved for concepts with insufficient information to code with codable children: Secondary | ICD-10-CM

## 2013-03-08 DIAGNOSIS — I5021 Acute systolic (congestive) heart failure: Secondary | ICD-10-CM

## 2013-03-08 DIAGNOSIS — R05 Cough: Secondary | ICD-10-CM

## 2013-03-08 DIAGNOSIS — I420 Dilated cardiomyopathy: Secondary | ICD-10-CM | POA: Diagnosis present

## 2013-03-08 DIAGNOSIS — F0280 Dementia in other diseases classified elsewhere without behavioral disturbance: Secondary | ICD-10-CM

## 2013-03-08 DIAGNOSIS — E1165 Type 2 diabetes mellitus with hyperglycemia: Secondary | ICD-10-CM

## 2013-03-08 DIAGNOSIS — Z9119 Patient's noncompliance with other medical treatment and regimen: Secondary | ICD-10-CM

## 2013-03-08 DIAGNOSIS — I509 Heart failure, unspecified: Secondary | ICD-10-CM

## 2013-03-08 DIAGNOSIS — R079 Chest pain, unspecified: Secondary | ICD-10-CM

## 2013-03-08 DIAGNOSIS — F3289 Other specified depressive episodes: Secondary | ICD-10-CM

## 2013-03-08 DIAGNOSIS — Z91199 Patient's noncompliance with other medical treatment and regimen due to unspecified reason: Secondary | ICD-10-CM

## 2013-03-08 DIAGNOSIS — F02818 Dementia in other diseases classified elsewhere, unspecified severity, with other behavioral disturbance: Secondary | ICD-10-CM

## 2013-03-08 DIAGNOSIS — R634 Abnormal weight loss: Secondary | ICD-10-CM

## 2013-03-08 DIAGNOSIS — I447 Left bundle-branch block, unspecified: Secondary | ICD-10-CM | POA: Diagnosis present

## 2013-03-08 DIAGNOSIS — F411 Generalized anxiety disorder: Secondary | ICD-10-CM

## 2013-03-08 DIAGNOSIS — F329 Major depressive disorder, single episode, unspecified: Secondary | ICD-10-CM

## 2013-03-08 DIAGNOSIS — E109 Type 1 diabetes mellitus without complications: Secondary | ICD-10-CM

## 2013-03-08 DIAGNOSIS — R9431 Abnormal electrocardiogram [ECG] [EKG]: Secondary | ICD-10-CM | POA: Diagnosis present

## 2013-03-08 DIAGNOSIS — R51 Headache: Secondary | ICD-10-CM

## 2013-03-08 DIAGNOSIS — E860 Dehydration: Secondary | ICD-10-CM

## 2013-03-08 DIAGNOSIS — R32 Unspecified urinary incontinence: Secondary | ICD-10-CM

## 2013-03-08 LAB — TROPONIN I: Troponin I: <0.3 ng/mL (ref <0.30)

## 2013-03-08 LAB — GLUCOSE, CAPILLARY
Glucose-Capillary: 280 mg/dL — ABNORMAL HIGH (ref 70–99)
Glucose-Capillary: 81 mg/dL (ref 70–99)

## 2013-03-08 LAB — CBC
HCT: 41 % (ref 36.0–46.0)
MCH: 28.7 pg (ref 26.0–34.0)
MCV: 83.5 fL (ref 78.0–100.0)
Platelets: 320 10*3/uL (ref 150–400)
RBC: 4.91 MIL/uL (ref 3.87–5.11)
WBC: 4.6 10*3/uL (ref 4.0–10.5)

## 2013-03-08 LAB — CREATININE, SERUM: GFR calc Af Amer: >90 mL/min (ref >90)

## 2013-03-08 LAB — TSH: TSH: 0.889 u[IU]/mL (ref 0.350–4.500)

## 2013-03-08 MED ORDER — SODIUM CHLORIDE 0.9 % IJ SOLN
3.0000 mL | Freq: Two times a day (BID) | INTRAMUSCULAR | Status: DC
  Administered 2013-03-08 – 2013-03-16 (×17): 3 mL via INTRAVENOUS

## 2013-03-08 MED ORDER — HEPARIN SODIUM (PORCINE) 5000 UNIT/ML IJ SOLN
5000.0000 [IU] | Freq: Three times a day (TID) | INTRAMUSCULAR | Status: DC
  Administered 2013-03-08 – 2013-03-16 (×23): 5000 [IU] via SUBCUTANEOUS
  Filled 2013-03-08 (×28): qty 1

## 2013-03-08 MED ORDER — FUROSEMIDE 10 MG/ML IJ SOLN
40.0000 mg | Freq: Two times a day (BID) | INTRAMUSCULAR | Status: DC
  Administered 2013-03-08 – 2013-03-09 (×3): 40 mg via INTRAVENOUS
  Filled 2013-03-08 (×4): qty 4

## 2013-03-08 MED ORDER — INSULIN ASPART 100 UNIT/ML ~~LOC~~ SOLN
0.0000 [IU] | Freq: Three times a day (TID) | SUBCUTANEOUS | Status: DC
  Administered 2013-03-08: 5 [IU] via SUBCUTANEOUS
  Administered 2013-03-09: 3 [IU] via SUBCUTANEOUS
  Administered 2013-03-09 (×2): 2 [IU] via SUBCUTANEOUS
  Administered 2013-03-10: 3 [IU] via SUBCUTANEOUS
  Administered 2013-03-10 (×2): 1 [IU] via SUBCUTANEOUS
  Administered 2013-03-11: 3 [IU] via SUBCUTANEOUS
  Administered 2013-03-11: 1 [IU] via SUBCUTANEOUS
  Administered 2013-03-12: 2 [IU] via SUBCUTANEOUS
  Administered 2013-03-12: 3 [IU] via SUBCUTANEOUS
  Administered 2013-03-12 – 2013-03-13 (×3): 2 [IU] via SUBCUTANEOUS
  Administered 2013-03-13: 1 [IU] via SUBCUTANEOUS
  Administered 2013-03-15 – 2013-03-16 (×2): 2 [IU] via SUBCUTANEOUS

## 2013-03-08 MED ORDER — LORAZEPAM 2 MG/ML IJ SOLN
1.0000 mg | Freq: Four times a day (QID) | INTRAMUSCULAR | Status: DC | PRN
  Administered 2013-03-08 – 2013-03-09 (×2): 1 mg via INTRAVENOUS
  Filled 2013-03-08 (×2): qty 1

## 2013-03-08 MED ORDER — CITALOPRAM HYDROBROMIDE 20 MG PO TABS
20.0000 mg | ORAL_TABLET | Freq: Every day | ORAL | Status: DC
  Administered 2013-03-08 – 2013-03-16 (×9): 20 mg via ORAL
  Filled 2013-03-08 (×9): qty 1

## 2013-03-08 MED ORDER — INSULIN ASPART 100 UNIT/ML ~~LOC~~ SOLN
0.0000 [IU] | Freq: Every day | SUBCUTANEOUS | Status: DC
Start: 2013-03-08 — End: 2013-03-16
  Administered 2013-03-08: 2 [IU] via SUBCUTANEOUS
  Administered 2013-03-09: 4 [IU] via SUBCUTANEOUS
  Administered 2013-03-10: 2 [IU] via SUBCUTANEOUS
  Administered 2013-03-11: 3 [IU] via SUBCUTANEOUS
  Administered 2013-03-12 – 2013-03-13 (×2): 2 [IU] via SUBCUTANEOUS
  Administered 2013-03-14: 3 [IU] via SUBCUTANEOUS
  Administered 2013-03-15: 2 [IU] via SUBCUTANEOUS

## 2013-03-08 MED ORDER — ASPIRIN EC 325 MG PO TBEC
325.0000 mg | DELAYED_RELEASE_TABLET | Freq: Every day | ORAL | Status: DC
  Administered 2013-03-08 – 2013-03-16 (×9): 325 mg via ORAL
  Filled 2013-03-08 (×9): qty 1

## 2013-03-08 MED ORDER — FUROSEMIDE 10 MG/ML IJ SOLN
40.0000 mg | Freq: Once | INTRAMUSCULAR | Status: AC
  Administered 2013-03-08: 40 mg via INTRAVENOUS
  Filled 2013-03-08: qty 4

## 2013-03-08 MED ORDER — INSULIN NPH (HUMAN) (ISOPHANE) 100 UNIT/ML ~~LOC~~ SUSP
14.0000 [IU] | Freq: Every day | SUBCUTANEOUS | Status: DC
Start: 2013-03-08 — End: 2013-03-13
  Administered 2013-03-08 – 2013-03-13 (×6): 14 [IU] via SUBCUTANEOUS
  Filled 2013-03-08: qty 10

## 2013-03-08 NOTE — Progress Notes (Addendum)
[facility] PROGRESS NOTE  Dana [name] ZOX:[id number] DOB: [birth year] DOA: 03/08/2013 PCP: Romero Belling, MD  Brief narrative 67 y.o. female with hx of HTN, DM, hyperlipidemia, anxiety and depression, but no known CAD or CHF presented to ED with approximately three-week history of worsening bilateral leg edema, dyspnea on exertion, but no chest pain, orthopnea or PND. Family history positive for MI in father at age 1. Evaluation in the ER included a BNP of 1008. CXR with vascular congestion and bilateral effusion. She has normal renal fx tests and negative troponin. Her EKG showed new complete LBBB, where as in march 2014, she has intraventicular conduction delay. Hospitalist was asked to admit her for new EKG changes of LBBB, and for new onset of CHF.   Assessment/Plan:  CHF exacerbation - Patient has no prior history of heart disease or CHF. - Unknown of systolic or diastolic. Followup 2-D echo. - Improved after IV Lasix-continue. - Cardiology input appreciated.  New LBBB - Troponin negative. - Followup 2-D echo. - No chest pain. Cardiology input appreciated: She may need ischemic  evaluation pending echo results. - Continue aspirin  Hypertension - Reasonable inpatient control.  Type II DM - Uncontrolled. A1c was 15.8 in September 2013 - Continue NPH and sliding scale insulin. We'll likely need adjustment of her  home insulin regimen. -  Check hemoglobin A1c.  Hyperlipidemia - Not on medications.  History of depression - Continue home medications  Dementia with Agitation - Patient was calm and coherent this morning. Sometime this afternoon she  started getting progressively confused, agitated and attempting to get out  of bed. - Discussed with spouse who gives history of similar behavior at home,  worse during the evening/night. - Treat with Ativan when necessary for agitation and placed safety sitter.  Code Status: Full Family Communication: Discussed with  spouse. Disposition Plan: Home when medically stable.   Consultants:  [facility]  Procedures:  None  Antibiotics:  None   HPI/Subjective: Dyspnea has improved. Denies chest pain.  Objective: Filed Vitals:   03/08/13 0337 03/08/13 0415 03/08/13 0419 03/08/13 0456  BP: 154/84 137/70  154/84  Pulse: 101 81  93  Temp:    98 F (36.7 C)  TempSrc:    Oral  Resp: 32   24  Weight:    74.4 kg (164 lb 0.4 oz)  SpO2: 96% 95% 100% 96%    Intake/Output Summary (Last 24 hours) at 03/08/13 1339 Last data filed at 03/08/13 8119  Gross per 24 hour  Intake    240 ml  Output   1875 ml  Net  -1635 ml   Filed Weights   03/08/13 0456  Weight: 74.4 kg (164 lb 0.4 oz)    Exam:   General exam: Moderately built and nourished female lying comfortably propped up in bed.  Respiratory system: Slightly reduced breath sounds in the bases with a few basal crackles. Rest of lung fields clear to auscultation. No increased work of breathing.  Cardiovascular system: S1 & S2 heard, RRR. No JVD, murmurs, gallops, clicks. 2+ pitting bilateral leg edema to knees.  Gastrointestinal system: Abdomen is nondistended, soft and nontender. Normal bowel sounds heard.  Central nervous system: Alert and oriented. No focal neurological deficits.  Extremities: Symmetric 5 x 5 power.   Data Reviewed: Basic Metabolic Panel:  Recent Labs Lab 03/07/13 2132 03/08/13 0624  NA 140  --   K 4.2  --   CL 102  --   CO2 30  --  GLUCOSE 376*  --   BUN 23  --   CREATININE 0.72 0.58  CALCIUM 8.9  --    Liver Function Tests: No results found for this basename: AST, ALT, ALKPHOS, BILITOT, PROT, ALBUMIN,  in the last 168 hours No results found for this basename: LIPASE, AMYLASE,  in the last 168 hours No results found for this basename: AMMONIA,  in the last 168 hours CBC:  Recent Labs Lab 03/07/13 2132 03/08/13 0624  WBC 3.9* 4.6  HGB 13.5 14.1  HCT 40.0 41.0  MCV 84.4 83.5  PLT 325 320    Cardiac Enzymes:  Recent Labs Lab 03/08/13 0336 03/08/13 1100  TROPONINI <0.30 <0.30   BNP (last 3 results)  Recent Labs  03/07/13 2132  PROBNP 1008.0*   CBG:  Recent Labs Lab 03/08/13 0508 03/08/13 0655 03/08/13 1120  GLUCAP 280* 279* 81    No results found for this or any previous visit (from the past 240 hour(s)).   Studies: Dg Chest 2 View  03/07/2013   *RADIOLOGY REPORT*  Clinical Data: Dizziness and chest pain.  CHEST - 2 VIEW  Comparison: 04/04/2012  Findings: The cardiac enlargement with pulmonary vascular congestion, peribronchial thickening and perihilar edema or infiltration.  Bilateral small pleural effusions with basilar atelectasis or consolidation in both lungs.  Changes are all new since the previous study.  No pneumothorax.  Calcification of the aorta.  IMPRESSION: Pulmonary vascular congestion with perihilar edema, bilateral pleural effusions, and basilar atelectasis or infiltration.   Original Report Authenticated By: Burman Nieves, M.D.     Additional labs:   Scheduled Meds: . aspirin EC  325 mg Oral Daily  . citalopram  20 mg Oral Daily  . furosemide  40 mg Intravenous Q12H  . heparin  5,000 Units Subcutaneous Q8H  . insulin aspart  0-5 Units Subcutaneous QHS  . insulin aspart  0-9 Units Subcutaneous TID WC  . insulin NPH  14 Units Subcutaneous QAC breakfast  . sodium chloride  3 mL Intravenous Q12H   Continuous Infusions:   Principal Problem:   CHF, acute Active Problems:   DEPRESSION   Edema   Chest pain   Diabetes mellitus type 2, uncontrolled   Noncompliance   Alzheimer's disease   Abnormal EKG   LBBB (left bundle branch block)    Time spent: 25 minutes    [facility]  [facility] Pager [phone].   If 8PM-8AM, please contact night-coverage at [email or web address], password [facility] 03/08/2013, 1:39 PM  LOS: 0 days

## 2013-03-08 NOTE — Progress Notes (Signed)
The patient arrived to 4E09 from the ED at 0500.  She was oriented to the unit and placed on telemetry.  VS were taken and the patient was assessed.  Her O2 level was decreased to 2L and her O2 sats were 96%.  She does not have any complaints of pain at this time.  The call bell was placed within reach and the bed alarm was turned on.

## 2013-03-08 NOTE — Progress Notes (Signed)
Pt is alert and oriented x4 but is very forgetful. Pt has a history of dementia. Pt is on a bed alarm. Pt is impulsive and doesn't follow commands. Pt is on RA.

## 2013-03-08 NOTE — ED Provider Notes (Signed)
CSN: [id number]     Arrival date & time 03/07/13  2123 History     First MD Initiated Contact with Patient 03/08/13 0045     Chief Complaint  Patient presents with  . Chest Pain   (Consider location/radiation/quality/duration/timing/severity/associated sxs/prior Treatment) Patient is a 67 y.o. female presenting with shortness of breath.  Shortness of Breath Severity:  Moderate Onset quality:  Gradual Duration: weeks. Timing:  Constant Progression:  Worsening Chronicity:  New Context: activity   Relieved by:  Rest Worsened by:  Activity Associated symptoms: no abdominal pain, no chest pain, no cough, no fever and no vomiting   Associated symptoms comment:  No orthopnea or PND   Past Medical History  Diagnosis Date  . ALLERGIC RHINITIS 02/08/2007  . ANXIETY 02/08/2007  . Cough 08/15/2008  . DEPRESSION 02/08/2007  . DIABETES MELLITUS, TYPE I 02/08/2007  . Headache(784.0) 11/06/2008  . HYPERCHOLESTEROLEMIA 11/05/2008  . HYPERTENSION 02/08/2007  . URINARY INCONTINENCE 02/08/2007  . Dyslipidemia   . Non-ulcer dyspepsia   . Leukopenia   . DM retinopathy     background  . 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 Dementia    Past Surgical History  Procedure Laterality Date  . Total knee arthroplasty  1990    left  . Panendoscopy  10/27/1999  . Stress cardiolite  12/14/2003  . Electrocardiogram  05/17/2006   Family History  Problem Relation Age of Onset  . Alcohol abuse Son   . Cancer Neg Hx     no cancer in immediate family   History  Substance Use Topics  . Smoking status: Never Smoker   . Smokeless tobacco: Never Used  . Alcohol Use: No   OB History   Grav Para Term Preterm Abortions TAB SAB Ect Mult Living                 Review of Systems  Constitutional: Negative for fever.  HENT: Negative for congestion.   Respiratory: Positive for shortness of breath. Negative for cough.   Cardiovascular: Positive for leg swelling. Negative for chest pain.   Gastrointestinal: Negative for nausea, vomiting, abdominal pain and diarrhea.  All other systems reviewed and are negative.    Allergies  Aspirin and Codeine  Home Medications   Current Outpatient Rx  Name  Route  Sig  Dispense  Refill  . citalopram (CELEXA) 20 MG tablet   Oral   Take 20 mg by mouth daily.         . insulin NPH (HUMULIN N,NOVOLIN N) 100 UNIT/ML injection   Subcutaneous   Inject 14 Units into the skin every morning. And pen needles 1/day   15 mL   12    BP 167/77  Pulse 96  Temp(Src) 99.4 F (37.4 C) (Oral)  Resp 20  SpO2 93% Physical Exam  Nursing note and vitals reviewed. Constitutional: She is oriented to person, place, and time. She appears well-developed and well-nourished. No distress.  HENT:  Head: Normocephalic and atraumatic.  Mouth/Throat: Oropharynx is clear and moist.  Eyes: Conjunctivae are normal. Pupils are equal, round, and reactive to light. No scleral icterus.  Neck: Neck supple.  Cardiovascular: Normal rate, regular rhythm, normal heart sounds and intact distal pulses.   No murmur heard. Pulmonary/Chest: Effort normal. No stridor. No respiratory distress. She has decreased breath sounds in the right lower field and the left lower field. She has rales (bibasilar).  Abdominal: Soft. Bowel sounds are normal. She exhibits no distension.  There is no tenderness.  Musculoskeletal: Normal range of motion. She exhibits edema (2+ BLE). She exhibits no tenderness.  Neurological: She is alert and oriented to person, place, and time.  Skin: Skin is warm and dry. No rash noted.  Psychiatric: She has a normal mood and affect. Her behavior is normal.    ED Course   Procedures (including critical care time)  Labs Reviewed  CBC - Abnormal; Notable for the following:    WBC 3.9 (*)    All other components within normal limits  BASIC METABOLIC PANEL - Abnormal; Notable for the following:    Glucose, Bld 376 (*)    GFR calc non Af Amer 86 (*)     All other components within normal limits  PRO B NATRIURETIC PEPTIDE - Abnormal; Notable for the following:    Pro B Natriuretic peptide (BNP) 1008.0 (*)    All other components within normal limits  POCT I-STAT TROPONIN I   EKG - sinus, rate 95, LAD, LBBB, compared to prior, LBBB replaced IVCD.    Dg Chest 2 View  03/07/2013   *RADIOLOGY REPORT*  Clinical Data: Dizziness and chest pain.  CHEST - 2 VIEW  Comparison: 04/04/2012  Findings: The cardiac enlargement with pulmonary vascular congestion, peribronchial thickening and perihilar edema or infiltration.  Bilateral small pleural effusions with basilar atelectasis or consolidation in both lungs.  Changes are all new since the previous study.  No pneumothorax.  Calcification of the aorta.  IMPRESSION: Pulmonary vascular congestion with perihilar edema, bilateral pleural effusions, and basilar atelectasis or infiltration.   Original Report Authenticated By: Burman Nieves, M.D.  All radiology studies independently viewed by me.      1. CHF 2. LBBB MDM  67 yo female with DM, HTN, HLD presenting with SOB, BLE edema, and intermittent chest tightness progressively worsening over weeks to months.  New LBBB compared to prior.  Negative troponins.  CXR and labs indicative of new CHF.  Admitted for further workup.    Candyce Churn, MD 03/09/13 [phone]

## 2013-03-08 NOTE — Progress Notes (Signed)
Utilization review completed.  P.J. Tiffany Talarico,RN,BSN Case Manager [phone]  

## 2013-03-08 NOTE — H&P (Signed)
[facility] History and Physical  Dana [name] WJX:[id number] DOB: 1945-08-10    PCP:   Romero Belling, MD   Chief Complaint: shortness of breath over a few months.  HPI: Dana [name] is an 67 y.o. female with hx of HTN, DM, hyperlipidemia, anxiety and depression, but no known CAD or CHF, presents to the ER with about 2 to 3 months of bilateral pedal edema, increase DOE, but no orthopnea or PND.  She has intermittent chest tightness.  There has been no fever or chills.  Evaluation in the ER included a BNP of 1008. CXR with vascular congestion and bilateral effusion.  She has normal renal fx tests and negative troponin.  Her EKG showed new complete LBBB, where as in march 2014, she has intraventicular conduction delay.  Hospitalist was asked to admit her for new EKG changes of LBBB, and for new onset of CHF.  Rewiew of Systems:  Constitutional: Negative for malaise, fever and chills. No significant weight loss or weight gain Eyes: Negative for eye pain, redness and discharge, diplopia, visual changes, or flashes of light. ENMT: Negative for ear pain, hoarseness, nasal congestion, sinus pressure and sore throat. No headaches; tinnitus, drooling, or problem swallowing. Cardiovascular: Negative for palpitations, diaphoresis. No orthopnea, PND Respiratory: Negative for cough, hemoptysis, wheezing and stridor. No pleuritic chestpain. Gastrointestinal: Negative for nausea, vomiting, diarrhea, constipation, abdominal pain, melena, blood in stool, hematemesis, jaundice and rectal bleeding.    Genitourinary: Negative for frequency, dysuria, incontinence,flank pain and hematuria; Musculoskeletal: Negative for back pain and neck pain. Negative for swelling and trauma.;  Skin: . Negative for pruritus, rash, abrasions, bruising and skin lesion.; ulcerations Neuro: Negative for headache, lightheadedness and neck stiffness. Negative for weakness, altered level of consciousness , altered mental status,  extremity weakness, burning feet, involuntary movement, seizure and syncope.  Psych: negative for anxiety, depression, insomnia, tearfulness, panic attacks, hallucinations, paranoia, suicidal or homicidal ideation    Past Medical History  Diagnosis Date  . ALLERGIC RHINITIS 02/08/2007  . ANXIETY 02/08/2007  . Cough 08/15/2008  . DEPRESSION 02/08/2007  . DIABETES MELLITUS, TYPE I 02/08/2007  . Headache(784.0) 11/06/2008  . HYPERCHOLESTEROLEMIA 11/05/2008  . HYPERTENSION 02/08/2007  . URINARY INCONTINENCE 02/08/2007  . Dyslipidemia   . Non-ulcer dyspepsia   . Leukopenia   . DM retinopathy     background  . 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 Dementia     Past Surgical History  Procedure Laterality Date  . Total knee arthroplasty  1990    left  . Panendoscopy  10/27/1999  . Stress cardiolite  12/14/2003  . Electrocardiogram  05/17/2006    Medications:  HOME MEDS: Prior to Admission medications   Medication Sig Start Date End Date Taking? Authorizing Provider  citalopram (CELEXA) 20 MG tablet Take 20 mg by mouth daily.   Yes Historical Provider, MD  insulin NPH (HUMULIN N,NOVOLIN N) 100 UNIT/ML injection Inject 14 Units into the skin every morning. And pen needles 1/day 01/16/13  Yes Romero Belling, MD     Allergies:  Allergies  Allergen Reactions  . Aspirin Nausea Only  . Codeine Nausea Only    Social History:   reports that she has never smoked. She has never used smokeless tobacco. She reports that she does not drink alcohol or use illicit drugs.  Family History: Family History  Problem Relation Age of Onset  . Alcohol abuse Son   . Cancer Neg Hx     no cancer  in immediate family     Physical Exam: Filed Vitals:   03/08/13 0203 03/08/13 0337 03/08/13 0415 03/08/13 0419  BP: 151/91 154/84 137/70   Pulse: 85 101 81   Temp:      TempSrc:      Resp: 20 32    SpO2: 93% 96% 95% 100%   Blood pressure 137/70, pulse 81, temperature 99.4 F (37.4 C),  temperature source Oral, resp. rate 32, SpO2 100.00%.  GEN:  Pleasant  patient lying in the stretcher in no acute distress; cooperative with exam. PSYCH:  alert and oriented x4; does not appear anxious or depressed; affect is appropriate. HEENT: Mucous membranes pink and anicteric; PERRLA; EOM intact; no cervical lymphadenopathy nor thyromegaly or carotid bruit; no JVD; There were no stridor. Neck is very supple. Breasts:: Not examined CHEST WALL: No tenderness CHEST: Normal respiration, with bilateral rales. No wheezing. HEART: Regular rate and rhythm.  There are no murmur, rub, or gallops.   BACK: No kyphosis or scoliosis; no CVA tenderness ABDOMEN: soft and non-tender; no masses, no organomegaly, normal abdominal bowel sounds; no pannus; no intertriginous candida. There is no rebound and no distention. Rectal Exam: Not done EXTREMITIES: No bone or joint deformity; age-appropriate arthropathy of the hands and knees;3+ edema; no ulcerations.  There is no calf tenderness. Genitalia: not examined PULSES: 2+ and symmetric SKIN: Normal hydration no rash or ulceration CNS: Cranial nerves 2-12 grossly intact no focal lateralizing neurologic deficit.  Speech is fluent; uvula elevated with phonation, facial symmetry and tongue midline. DTR are normal bilaterally, cerebella exam is intact, barbinski is negative and strengths are equaled bilaterally.  No sensory loss.   Labs on Admission:  Basic Metabolic Panel:  Recent Labs Lab 03/07/13 2132  NA 140  K 4.2  CL 102  CO2 30  GLUCOSE 376*  BUN 23  CREATININE 0.72  CALCIUM 8.9   Liver Function Tests: No results found for this basename: AST, ALT, ALKPHOS, BILITOT, PROT, ALBUMIN,  in the last 168 hours No results found for this basename: LIPASE, AMYLASE,  in the last 168 hours No results found for this basename: AMMONIA,  in the last 168 hours CBC:  Recent Labs Lab 03/07/13 2132  WBC 3.9*  HGB 13.5  HCT 40.0  MCV 84.4  PLT 325    Cardiac Enzymes:  Recent Labs Lab 03/08/13 0336  TROPONINI <0.30    CBG: No results found for this basename: GLUCAP,  in the last 168 hours   Radiological Exams on Admission: Dg Chest 2 View  03/07/2013   *RADIOLOGY REPORT*  Clinical Data: Dizziness and chest pain.  CHEST - 2 VIEW  Comparison: 04/04/2012  Findings: The cardiac enlargement with pulmonary vascular congestion, peribronchial thickening and perihilar edema or infiltration.  Bilateral small pleural effusions with basilar atelectasis or consolidation in both lungs.  Changes are all new since the previous study.  No pneumothorax.  Calcification of the aorta.  IMPRESSION: Pulmonary vascular congestion with perihilar edema, bilateral pleural effusions, and basilar atelectasis or infiltration.   Original Report Authenticated By: Burman Nieves, M.D.    EKG: Independently reviewed. New LBBB as compared to 09/2012.   Assessment/Plan Present on Admission:  . CHF, acute . Edema . Diabetes mellitus type 2, uncontrolled . DEPRESSION . Chest pain . Alzheimer's disease . Abnormal EKG  PLAN:  Will admit her for new onset of CHF, and new LBBB as compared to EKG 3 months ago.  WIll diurese her with IV Lasix BID.  She will  need an ECHO.  For her DM, will continue with her meds.  Her pleural effusion bilaterally is likely from her CHF as well.  Should consider an ischemic work up also, but I will defer to the day team to decide.  She is stable, full code, and will be admitted to [facility].  Thank you for allowing me to participate in the care of this nice patient.  Other plans as per orders.  Code Status: FULL Dana Lightning, MD. [facility] Pager [phone] 7pm to 7am.  03/08/2013, 4:52 AM

## 2013-03-08 NOTE — Consult Note (Signed)
Patient ID: Dana [name] MRN: [id number] DOB/AGE: 1945-12-15 66 y.o.  Admit date: 03/08/2013 Referring Physician: [facility] Primary Cardiologist: New Reason for Consultation: CHF  HPI: 67 yo female with history of HTN, DM, HLD, anxiety and depression admitted with lower extremity edema, dyspnea on exertion x 3 months. No prior heart disease. BNP elevated at 1008. Chest x-ray with evidence of pulmonary edema and bilateral pleural effusions. Her cardiac markers have been negative thus far. She denies any chest pain at rest or with exertion at home over last year. Her EKG on admission shows LBBB which was not present on EKG in March 2014. No orthopnea, PND, palpitations, near syncope or syncope.    Past Medical History  Diagnosis Date  . ALLERGIC RHINITIS 02/08/2007  . ANXIETY 02/08/2007  . Cough 08/15/2008  . DEPRESSION 02/08/2007  . DIABETES MELLITUS, TYPE I 02/08/2007  . Headache(784.0) 11/06/2008  . HYPERCHOLESTEROLEMIA 11/05/2008  . HYPERTENSION 02/08/2007  . URINARY INCONTINENCE 02/08/2007  . Dyslipidemia   . Non-ulcer dyspepsia   . Leukopenia   . DM retinopathy     background  . 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 Dementia     Family History  Problem Relation Age of Onset  . Alcohol abuse Son   . Cancer Neg Hx     no cancer in immediate family  . CAD Father     History   Social History  . Marital Status: Married    Spouse Name: N/A    Number of Children: N/A  . Years of Education: N/A   Occupational History  . Retired    Social History Main Topics  . Smoking status: Never Smoker   . Smokeless tobacco: Never Used  . Alcohol Use: No  . Drug Use: No  . Sexual Activity: No   Other Topics Concern  . Not on file   Social History Narrative  . No narrative on file    Past Surgical History  Procedure Laterality Date  . Total knee arthroplasty  1990    left  . Panendoscopy  10/27/1999  . Stress cardiolite  12/14/2003  . Electrocardiogram  05/17/2006    . Abdominal hysterectomy      Allergies  Allergen Reactions  . Aspirin Nausea Only  . Codeine Nausea Only    Prescriptions prior to admission  Medication Sig Dispense Refill  . citalopram (CELEXA) 20 MG tablet Take 20 mg by mouth daily.      . insulin NPH (HUMULIN N,NOVOLIN N) 100 UNIT/ML injection Inject 14 Units into the skin every morning. And pen needles 1/day  15 mL  12   Hospital Medications:  . aspirin EC  325 mg Oral Daily  . citalopram  20 mg Oral Daily  . furosemide  40 mg Intravenous Q12H  . heparin  5,000 Units Subcutaneous Q8H  . insulin aspart  0-5 Units Subcutaneous QHS  . insulin aspart  0-9 Units Subcutaneous TID WC  . insulin NPH  14 Units Subcutaneous QAC breakfast  . sodium chloride  3 mL Intravenous Q12H    Review of systems complete and found to be negative unless listed above   Physical Exam: Blood pressure 154/84, pulse 93, temperature 98 F (36.7 C), temperature source Oral, resp. rate 24, weight 164 lb 0.4 oz (74.4 kg), SpO2 96.00%.    General: Well developed, well nourished, NAD  HEENT: OP clear, mucus membranes moist  SKIN: warm, dry. No rashes.  Neuro: No focal deficits  Musculoskeletal: Muscle strength 5/5 all ext  Psychiatric: Mood and affect normal  Neck: JVP not elevated.  Lungs: Bibasilar crackles. No wheezes, rhonci, crackles  Cardiovascular: Regular rate and rhythm. No murmurs, gallops or rubs.  Abdomen:Soft. Bowel sounds present. Non-tender.  Extremities: 2-3+ bilateral lower extremity edema.    Labs:   Lab Results  Component Value Date   WBC 4.6 03/08/2013   HGB 14.1 03/08/2013   HCT 41.0 03/08/2013   MCV 83.5 03/08/2013   PLT 320 03/08/2013     Recent Labs Lab 03/07/13 2132 03/08/13 0624  NA 140  --   K 4.2  --   CL 102  --   CO2 30  --   BUN 23  --   CREATININE 0.72 0.58  CALCIUM 8.9  --   GLUCOSE 376*  --    Lab Results  Component Value Date   TROPONINI <0.30 03/08/2013      Radiology: Chest x-ray:  03/08/13 Findings: The cardiac enlargement with pulmonary vascular  congestion, peribronchial thickening and perihilar edema or  infiltration. Bilateral small pleural effusions with basilar  atelectasis or consolidation in both lungs. Changes are all new  since the previous study. No pneumothorax. Calcification of the  aorta.  IMPRESSION:  Pulmonary vascular congestion with perihilar edema, bilateral  pleural effusions, and basilar atelectasis or infiltration.  EKG: sinus, LBBB  ASSESSMENT AND PLAN:   1. Volume overload consistent with congestive heart failure: She has no known heart disease. Agree with Echo today to assess LVEF and exclude valvular heart disease. Agree with IV Lasix. Will follow up on echo and follow diuresis. Given risk factors for CAD, she may need ischemic evaluation but will discuss after her echo is completed. We will follow with you.   Signed: MCALHANY,CHRISTOPHER 03/08/2013, 10:30 AM

## 2013-03-08 NOTE — Progress Notes (Signed)
Inpatient Diabetes Program Recommendations  AACE/ADA: New Consensus Statement on Inpatient Glycemic Control (2013)  Target Ranges:  Prepandial:   less than 140 mg/dL      Peak postprandial:   less than 180 mg/dL (1-2 hours)      Critically ill patients:  140 - 180 mg/dL  Results for Dana [name], Dana [name] (MRN [id number]) as of 03/08/2013 12:50  Ref. Range 03/08/2013 05:08 03/08/2013 06:55 03/08/2013 11:20  Glucose-Capillary Latest Range: 70-99 mg/dL 782 (H) 956 (H) 81   Inpatient Diabetes Program Recommendations HgbA1C: order to assess prehospital glucose control    Results for Dana [name], Dana [name] (MRN [id number]) as of 03/08/2013 12:50  Ref. Range 04/05/2012 09:30  Hemoglobin A1C Latest Range: <5.7 % 15.8 (H)   Will follow. Thank you  Piedad Climes BSN, RN,CDE Inpatient Diabetes Coordinator [phone] (team pager)

## 2013-03-09 DIAGNOSIS — I517 Cardiomegaly: Secondary | ICD-10-CM

## 2013-03-09 DIAGNOSIS — I10 Essential (primary) hypertension: Secondary | ICD-10-CM

## 2013-03-09 LAB — BASIC METABOLIC PANEL
CO2: 30 mEq/L (ref 19–32)
Calcium: 9.5 mg/dL (ref 8.4–10.5)
Creatinine, Ser: 0.62 mg/dL (ref 0.50–1.10)
GFR calc non Af Amer: 90 mL/min — ABNORMAL LOW (ref >90)
Glucose, Bld: 220 mg/dL — ABNORMAL HIGH (ref 70–99)

## 2013-03-09 LAB — GLUCOSE, CAPILLARY
Glucose-Capillary: 185 mg/dL — ABNORMAL HIGH (ref 70–99)
Glucose-Capillary: 318 mg/dL — ABNORMAL HIGH (ref 70–99)

## 2013-03-09 LAB — HEMOGLOBIN A1C
Hgb A1c MFr Bld: 10.4 % — ABNORMAL HIGH (ref <5.7)
Mean Plasma Glucose: 252 mg/dL — ABNORMAL HIGH (ref <117)

## 2013-03-09 MED ORDER — FUROSEMIDE 10 MG/ML IJ SOLN
60.0000 mg | Freq: Two times a day (BID) | INTRAMUSCULAR | Status: DC
  Administered 2013-03-09 – 2013-03-11 (×4): 60 mg via INTRAVENOUS
  Filled 2013-03-09 (×5): qty 6

## 2013-03-09 MED ORDER — LORAZEPAM 2 MG/ML IJ SOLN
1.0000 mg | Freq: Four times a day (QID) | INTRAMUSCULAR | Status: DC | PRN
  Administered 2013-03-10 – 2013-03-14 (×4): 2 mg via INTRAVENOUS
  Filled 2013-03-09 (×5): qty 1

## 2013-03-09 MED ORDER — LORAZEPAM 2 MG/ML IJ SOLN
1.0000 mg | Freq: Once | INTRAMUSCULAR | Status: AC
  Administered 2013-03-09: 1 mg via INTRAVENOUS
  Filled 2013-03-09: qty 1

## 2013-03-09 NOTE — Progress Notes (Signed)
  Echocardiogram 2D Echocardiogram has been performed.  Dana [name] 03/09/2013, 10:55 AM

## 2013-03-09 NOTE — Progress Notes (Addendum)
Met with Mrs Mcclaine to discuss [facility] Care Management services. She reports she lives with her husband and plans to return home upon arrival. Obtained permission from patient to call on the phone to explain services as well. Consents were signed. Patient will receive post hospital discharge call and will be evaluated for monthly home visits. Confirmed best contact number as [phone]. Husband states they do have a scale to weigh daily, however he states sometimes it is hard to get patient to agree to go to the MD. She is slightly pleasantly confused. [facility] Care Management will not replace home health. Will make inpatient RNCM aware of meeting.  Raiford Noble, MSN-Ed, RN, BSN- [facility] [phone]

## 2013-03-09 NOTE — Progress Notes (Signed)
[facility] PROGRESS NOTE  Dana [name] ZOX:[id number] DOB: 1946/03/26 DOA: 03/08/2013 PCP: Romero Belling, MD  Brief narrative 67 y.o. female with hx of HTN, DM, hyperlipidemia, anxiety and depression, but no known CAD or CHF presented to ED with approximately three-week history of worsening bilateral leg edema, dyspnea on exertion, but no chest pain, orthopnea or PND. Family history positive for MI in father at age 53. Evaluation in the ER included a BNP of 1008. CXR with vascular congestion and bilateral effusion. She has normal renal fx tests and negative troponin. Her EKG showed new complete LBBB, where as in march 2014, she has intraventicular conduction delay. Hospitalist was asked to admit her for new EKG changes of LBBB, and for new onset of CHF.   Assessment/Plan:  CHF exacerbation - Patient has no prior history of heart disease or CHF. - Unknown of systolic or diastolic. Followup 2-D echo- done 8/21 & report pending. - Improved after IV Lasix-continue. Diuresed ~ 3.5 liters since admission. - Cardiology input appreciated.  New LBBB - Troponin negative. - Followup 2-D echo. - No chest pain. Cardiology input appreciated: She may need ischemic evaluation pending echo results. - Continue aspirin  Hypertension - Reasonable inpatient control.  Type II DM - Uncontrolled. A1c was 15.8 in September 2013. FU repeat A1C - Continue NPH and sliding scale insulin. We'll likely need adjustment of her home insulin regimen.  Hyperlipidemia - Not on medications.  History of depression - Continue home medications  Dementia with Agitation - On 8/20 afternoon she started getting progressively confused, agitated and attempting to get out  of bed. - Discussed with spouse who gives history of similar behavior at home, worse during the evening/night. - Treat with Ativan when necessary for agitation and placed safety sitter. - Improved this AM. Pt has no recollection. - RPR & TSH  unremarkable in past  Code Status: Full Family Communication: Discussed with spouse 8/20. Disposition Plan: Home when medically stable.   Consultants:  [facility]  Procedures:  None  Antibiotics:  None   HPI/Subjective: Dyspnea has improved. Denies chest pain.   Objective: Filed Vitals:   03/08/13 2136 03/09/13 0555 03/09/13 0604 03/09/13 0855  BP: 142/74 132/69    Pulse: 89 89    Temp: 98 F (36.7 C) 98.3 F (36.8 C)    TempSrc: Oral Oral    Resp: 20 16    Weight:  71.4 kg (157 lb 6.5 oz)    SpO2: 96% 90% 96% 98%    Intake/Output Summary (Last 24 hours) at 03/09/13 0920 Last data filed at 03/09/13 0912  Gross per 24 hour  Intake    720 ml  Output   2901 ml  Net  -2181 ml   Filed Weights   03/08/13 0456 03/09/13 0555  Weight: 74.4 kg (164 lb 0.4 oz) 71.4 kg (157 lb 6.5 oz)    Exam:   General exam: Moderately built and nourished female sitting up at edge of bed.  Respiratory system: Slightly reduced breath sounds in the bases with a few basal crackles. Rest of lung fields clear to auscultation. No increased work of breathing.  Cardiovascular system: S1 & S2 heard, RRR. No JVD, murmurs, gallops, clicks. 2+ pitting bilateral leg edema to knees.  Gastrointestinal system: Abdomen is nondistended, soft and nontender. Normal bowel sounds heard.  Central nervous system: Alert and oriented to person & place. No focal neurological deficits.  Extremities: Symmetric 5 x 5 power.   Data Reviewed: Basic Metabolic Panel:  Recent Labs Lab 03/07/13 2132 03/08/13 0624 03/09/13 0625  NA 140  --  140  K 4.2  --  4.2  CL 102  --  99  CO2 30  --  30  GLUCOSE 376*  --  220*  BUN 23  --  20  CREATININE 0.72 0.58 0.62  CALCIUM 8.9  --  9.5   Liver Function Tests: No results found for this basename: AST, ALT, ALKPHOS, BILITOT, PROT, ALBUMIN,  in the last 168 hours No results found for this basename: LIPASE, AMYLASE,  in the last 168 hours No results  found for this basename: AMMONIA,  in the last 168 hours CBC:  Recent Labs Lab 03/07/13 2132 03/08/13 0624  WBC 3.9* 4.6  HGB 13.5 14.1  HCT 40.0 41.0  MCV 84.4 83.5  PLT 325 320   Cardiac Enzymes:  Recent Labs Lab 03/08/13 0336 03/08/13 1100 03/08/13 1709  TROPONINI <0.30 <0.30 <0.30   BNP (last 3 results)  Recent Labs  03/07/13 2132  PROBNP 1008.0*   CBG:  Recent Labs Lab 03/08/13 0655 03/08/13 1120 03/08/13 1621 03/08/13 2124 03/09/13 0617  GLUCAP 279* 81 88 208* 195*    No results found for this or any previous visit (from the past 240 hour(s)).   Studies: Dg Chest 2 View  03/07/2013   *RADIOLOGY REPORT*  Clinical Data: Dizziness and chest pain.  CHEST - 2 VIEW  Comparison: 04/04/2012  Findings: The cardiac enlargement with pulmonary vascular congestion, peribronchial thickening and perihilar edema or infiltration.  Bilateral small pleural effusions with basilar atelectasis or consolidation in both lungs.  Changes are all new since the previous study.  No pneumothorax.  Calcification of the aorta.  IMPRESSION: Pulmonary vascular congestion with perihilar edema, bilateral pleural effusions, and basilar atelectasis or infiltration.   Original Report Authenticated By: Burman Nieves, M.D.     Additional labs:   Scheduled Meds: . aspirin EC  325 mg Oral Daily  . citalopram  20 mg Oral Daily  . furosemide  40 mg Intravenous Q12H  . heparin  5,000 Units Subcutaneous Q8H  . insulin aspart  0-5 Units Subcutaneous QHS  . insulin aspart  0-9 Units Subcutaneous TID WC  . insulin NPH  14 Units Subcutaneous QAC breakfast  . sodium chloride  3 mL Intravenous Q12H   Continuous Infusions:   Principal Problem:   CHF, acute Active Problems:   DEPRESSION   Edema   Chest pain   Diabetes mellitus type 2, uncontrolled   Noncompliance   Alzheimer's disease   Abnormal EKG   LBBB (left bundle branch block)    Time spent: 25  minutes    [facility]  [facility] Pager [phone].   If 8PM-8AM, please contact night-coverage at [email or web address], password [facility] 03/09/2013, 9:20 AM  LOS: 1 day

## 2013-03-09 NOTE — Progress Notes (Addendum)
    SUBJECTIVE: No complaints. No chest pain or SOB  BP 132/69  Pulse 89  Temp(Src) 98.3 F (36.8 C) (Oral)  Resp 16  Wt 157 lb 6.5 oz (71.4 kg)  BMI 27.89 kg/m2  SpO2 96%  Intake/Output Summary (Last 24 hours) at 03/09/13 0731 Last data filed at 03/09/13 8413  Gross per 24 hour  Intake    720 ml  Output   2800 ml  Net  -2080 ml    PHYSICAL EXAM General: Well developed, well nourished, in no acute distress. Alert  Psych:  Good affect, responds appropriately Neck: No JVD. No masses noted.  Lungs: Clear bilaterally with no wheezes or rhonci noted.  Heart: RRR with no murmurs noted. Abdomen: Bowel sounds are present. Soft, non-tender.  Extremities: 2-3+ bilateral lower extremity edema.   LABS: Basic Metabolic Panel:  Recent Labs  24/40/10 2132 03/08/13 0624  NA 140  --   K 4.2  --   CL 102  --   CO2 30  --   GLUCOSE 376*  --   BUN 23  --   CREATININE 0.72 0.58  CALCIUM 8.9  --    CBC:  Recent Labs  03/07/13 2132 03/08/13 0624  WBC 3.9* 4.6  HGB 13.5 14.1  HCT 40.0 41.0  MCV 84.4 83.5  PLT 325 320   Cardiac Enzymes:  Recent Labs  03/08/13 0336 03/08/13 1100 03/08/13 1709  TROPONINI <0.30 <0.30 <0.30   Current Meds: . aspirin EC  325 mg Oral Daily  . citalopram  20 mg Oral Daily  . furosemide  40 mg Intravenous Q12H  . heparin  5,000 Units Subcutaneous Q8H  . insulin aspart  0-5 Units Subcutaneous QHS  . insulin aspart  0-9 Units Subcutaneous TID WC  . insulin NPH  14 Units Subcutaneous QAC breakfast  . sodium chloride  3 mL Intravenous Q12H   ASSESSMENT AND PLAN:   1. Volume overload consistent with congestive heart failure: She has no known heart disease. Echo today to assess LVEF and exclude valvular heart disease. Agree with IV Lasix. She is diuresing well. Will follow up on echo and follow diuresis. Given risk factors for CAD, she may need ischemic evaluation but will discuss after her echo is completed. We will follow with you.   2.  LBBB   MCALHANY,CHRISTOPHER  8/21/20147:31 AM

## 2013-03-09 NOTE — Progress Notes (Signed)
Nutrition Brief Note  Patient identified on the Malnutrition Screening Tool (MST) Report. Pt with stable weights and good appetite at this time.  Wt Readings from Last 15 Encounters:  03/09/13 157 lb 6.5 oz (71.4 kg)  10/13/12 142 lb (64.411 kg)  09/25/12 134 lb 4.2 oz (60.9 kg)  04/07/12 134 lb 0.6 oz (60.8 kg)  06/30/11 127 lb (57.607 kg)  06/24/10 131 lb (59.421 kg)  11/05/08 139 lb (63.05 kg)  09/19/08 151 lb 6.4 oz (68.675 kg)  08/20/08 140 lb 2.1 oz (63.563 kg)  08/15/08 136 lb (61.689 kg)  05/25/07 167 lb 4 oz (75.864 kg)  07/26/06 155 lb (70.308 kg)    Body mass index is 27.89 kg/(m^2). Patient meets criteria for Overweight based on current BMI.   Current diet order is Carbohydrate Modified Medium diet, patient is consuming approximately 80% of meals at this time. Labs and medications reviewed.   No nutrition interventions warranted at this time. If nutrition issues arise, please consult RD.   Dana Motto MS, RD, LDN Pager: [phone] After-hours pager: [phone]

## 2013-03-09 NOTE — Progress Notes (Signed)
1500: RN was called to patient's room by the sitter. Pt was threatening to leave. RN assessed the patient's orientation and the patient was unable to tell the RN where the was. The RN informed the patient that she was in the hospital. The patient then became verbally aggressive and started using profanity towards the RN and sitter. RN tried to reorient patient. Pt became verbally and physically aggressive and started hitting and swinging at the RN and sitter. RN went to get PRN Ativan. Pt continued to use profanity and threatening the RN. PRN Ativan was administered. Patient is now sitting in bed and laughing with the sitter. Will make MD aware of situation.

## 2013-03-09 NOTE — Progress Notes (Signed)
Inpatient Diabetes Program Recommendations  AACE/ADA: New Consensus Statement on Inpatient Glycemic Control (2013)  Target Ranges:  Prepandial:   less than 140 mg/dL      Peak postprandial:   less than 180 mg/dL (1-2 hours)      Critically ill patients:  140 - 180 mg/dL   Reason for Visit: Hyperglycemia  HgbA1C pending.  Eating 80 - 100%.  Results for WYLMA, TATEM (MRN [id number]) as of 03/09/2013 12:51  Ref. Range 03/08/2013 16:21 03/08/2013 21:24 03/09/2013 06:17 03/09/2013 10:57  Glucose-Capillary Latest Range: 70-99 mg/dL 88 409 (H) 811 (H) 914 (H)   Hyperglycemia - uncontrolled DM  Inpatient Diabetes Program Recommendations Insulin - Meal Coverage: Consider addition of meal coverage insulin - Novolog 3 units tidwc if pt eats >50% meal HgbA1C: order to assess prehospital glucose control  Note: Will continue to follow.  Thank you. Ailene Ards, RD, LDN, CDE Inpatient Diabetes Coordinator [phone]

## 2013-03-10 DIAGNOSIS — E876 Hypokalemia: Secondary | ICD-10-CM

## 2013-03-10 DIAGNOSIS — F0391 Unspecified dementia with behavioral disturbance: Secondary | ICD-10-CM

## 2013-03-10 DIAGNOSIS — I420 Dilated cardiomyopathy: Secondary | ICD-10-CM | POA: Diagnosis present

## 2013-03-10 DIAGNOSIS — I428 Other cardiomyopathies: Secondary | ICD-10-CM

## 2013-03-10 DIAGNOSIS — I5021 Acute systolic (congestive) heart failure: Principal | ICD-10-CM

## 2013-03-10 LAB — GLUCOSE, CAPILLARY
Glucose-Capillary: 146 mg/dL — ABNORMAL HIGH (ref 70–99)
Glucose-Capillary: 128 mg/dL — ABNORMAL HIGH (ref 70–99)
Glucose-Capillary: 242 mg/dL — ABNORMAL HIGH (ref 70–99)

## 2013-03-10 LAB — BASIC METABOLIC PANEL
GFR calc Af Amer: >90 mL/min (ref >90)
GFR calc non Af Amer: >90 mL/min (ref >90)
Potassium: 3.4 mEq/L — ABNORMAL LOW (ref 3.5–5.1)
Sodium: 138 mEq/L (ref 135–145)

## 2013-03-10 MED ORDER — INSULIN NPH (HUMAN) (ISOPHANE) 100 UNIT/ML ~~LOC~~ SUSP
5.0000 [IU] | Freq: Every day | SUBCUTANEOUS | Status: DC
  Administered 2013-03-10 – 2013-03-15 (×6): 5 [IU] via SUBCUTANEOUS
  Filled 2013-03-10: qty 10

## 2013-03-10 MED ORDER — CARVEDILOL 3.125 MG PO TABS
3.1250 mg | ORAL_TABLET | Freq: Two times a day (BID) | ORAL | Status: DC
  Administered 2013-03-10 – 2013-03-16 (×11): 3.125 mg via ORAL
  Filled 2013-03-10 (×15): qty 1

## 2013-03-10 MED ORDER — POTASSIUM CHLORIDE CRYS ER 20 MEQ PO TBCR
60.0000 meq | EXTENDED_RELEASE_TABLET | Freq: Once | ORAL | Status: AC
  Administered 2013-03-10: 60 meq via ORAL
  Filled 2013-03-10: qty 3

## 2013-03-10 MED ORDER — INSULIN ASPART 100 UNIT/ML ~~LOC~~ SOLN
3.0000 [IU] | Freq: Three times a day (TID) | SUBCUTANEOUS | Status: DC
  Administered 2013-03-10 – 2013-03-15 (×11): 3 [IU] via SUBCUTANEOUS

## 2013-03-10 NOTE — Progress Notes (Signed)
SUBJECTIVE: No chest pain or SOB. Episode of agitation/combative behavior noted yesterday.   BP 109/57  Pulse 80  Temp(Src) 98.5 F (36.9 C) (Oral)  Resp 18  Wt 155 lb 3.3 oz (70.4 kg)  BMI 27.5 kg/m2  SpO2 93%  Intake/Output Summary (Last 24 hours) at 03/10/13 1610 Last data filed at 03/10/13 9604  Gross per 24 hour  Intake    720 ml  Output   2876 ml  Net  -2156 ml    PHYSICAL EXAM General: Well developed, well nourished, in no acute distress. Alert and oriented x 3 today. She knows her name, city and that she is in the hospital.  Psych:  Good affect, responds appropriately Neck: No JVD. No masses noted.  Lungs: Clear bilaterally with no wheezes or rhonci noted.  Heart: RRR with no murmurs noted. Abdomen: Bowel sounds are present. Soft, non-tender.  Extremities: 1-2+ bilateral lower extremity edema.   LABS: Basic Metabolic Panel:  Recent Labs  54/09/81 0625 03/10/13 0455  NA 140 138  K 4.2 3.4*  CL 99 98  CO2 30 33*  GLUCOSE 220* 209*  BUN 20 21  CREATININE 0.62 0.58  CALCIUM 9.5 8.5   CBC:  Recent Labs  03/07/13 2132 03/08/13 0624  WBC 3.9* 4.6  HGB 13.5 14.1  HCT 40.0 41.0  MCV 84.4 83.5  PLT 325 320   Cardiac Enzymes:  Recent Labs  03/08/13 0336 03/08/13 1100 03/08/13 1709  TROPONINI <0.30 <0.30 <0.30   Current Meds: . aspirin EC  325 mg Oral Daily  . citalopram  20 mg Oral Daily  . furosemide  60 mg Intravenous Q12H  . heparin  5,000 Units Subcutaneous Q8H  . insulin aspart  0-5 Units Subcutaneous QHS  . insulin aspart  0-9 Units Subcutaneous TID WC  . insulin NPH  14 Units Subcutaneous QAC breakfast  . sodium chloride  3 mL Intravenous Q12H   Echo 03/09/13: Left ventricle: The cavity size was normal. Wall thickness was normal. Systolic function was severely reduced. The estimated ejection fraction was in the range of 25% to 30%. There is akinesis of the anteroseptal and apical myocardium. There is hypokinesis of the  inferior myocardium. Doppler parameters are consistent with abnormal left ventricular relaxation (grade 1 diastolic dysfunction). - Mitral valve: Calcified annulus. Mildly thickened leaflets - Left atrium: The atrium was mildly dilated. - Pericardium, extracardiac: A trivial pericardial effusion was identified. There was a left pleural effusion.   ASSESSMENT AND PLAN:  1. Acute systolic CHF: Pt admitted with volume overload c/w CHF. Echo 03/09/13 with LVEF 25-30%. No significant valve disease.  No prior known cardiac disease. ? Etiology of cardiomyopathy. Agree with continued IV Lasix. She continues to diurese well. (4.8 liters since admission). Given risk factors for CAD with new cardiomyopathy, she will need a right and left heart cath to better define her pressures. Timing of the cardiac cath will have to be arranged after discussion with family and patient. She has been very agitated and combative at times with nursing staff as documented in the chart. Given dementia, this will complicate her planning of cardiac cath. Would add Coreg 3.125 mg po BID. Will follow BP and see if she tolerate the beta blocker before adding an Ace-inh. Would continue diuresis for now as you are doing. Will try to meet with pt and her husband today to discuss planning of cath.   2. LBBB  3. Hypokalemia: Replace potassium today.    Dana [name]  8/22/20147:13  AM

## 2013-03-10 NOTE — Progress Notes (Signed)
Pt is beginning to question where she is and is stating "I want to go home." Pt is not able to tell the RN where she is. When RN asked the patient where she is the patient responded "I'm at [facility]." Pt is again threatening to leave the hospital. When RN informed the patient that she is in the hospital, the patient responded in a rude tone "What am I doing here?!!" RN and sitter got the patient to the bed. 2 mg of Ativan has been administered (See MAR).

## 2013-03-10 NOTE — Consult Note (Signed)
Reason for Consult: Agitation Referring Physician: Elease Etienne, MD  [name] is an 67 y.o. female.  HPI: Dana [name] is an 67 y.o. female with history of dementia, anxiety and depression admitted to [facility] cone medical floor for untreated bilateral pedal edema. Reportedly family requested psychiatric consultation to control her behavior problems. Reportedly patient was confused, irritable and agitated last night the patient has been doing fine this morning as per the medical records. Patient husband is at bedside stated she has been confused, forgetful and irritable and mean to other people in the past. Patient cannot remember to be words given to her and cannot recall. Patient knows he was in a hospital but does not know the name of the hospital. Patient stated she has been here for nausea when she actually brought in for the bilateral pedal edema. Patient has limited insight into her medical condition. Patient does not meet criteria for capaucity to make her own medical decisions.   MSE: Patient appeared as per her stated age, lying down in her bed, and poorly cooperative. Patient has a poor orientation, concentration, immediate and delayed memory and unable to cooperate with the calculation, proverb interpretation and has a limited fund of knowledge. Patient has poor insight judgment impulse control.  Past Medical History  Diagnosis Date  . ALLERGIC RHINITIS 02/08/2007  . ANXIETY 02/08/2007  . Cough 08/15/2008  . DEPRESSION 02/08/2007  . DIABETES MELLITUS, TYPE I 02/08/2007  . Headache(784.0) 11/06/2008  . HYPERCHOLESTEROLEMIA 11/05/2008  . HYPERTENSION 02/08/2007  . URINARY INCONTINENCE 02/08/2007  . Dyslipidemia   . Non-ulcer dyspepsia   . Leukopenia   . DM retinopathy     background  . 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 Dementia     Past Surgical History  Procedure Laterality Date  . Total knee arthroplasty  1990    left  . Panendoscopy  10/27/1999  . Stress  cardiolite  12/14/2003  . Electrocardiogram  05/17/2006  . Abdominal hysterectomy      Family History  Problem Relation Age of Onset  . Alcohol abuse Son   . Cancer Neg Hx     no cancer in immediate family  . CAD Father     Social History:  reports that she has never smoked. She has never used smokeless tobacco. She reports that she does not drink alcohol or use illicit drugs.  Allergies:  Allergies  Allergen Reactions  . Aspirin Nausea Only  . Codeine Nausea Only    Medications: I have reviewed the patient's current medications.  Results for orders placed during the hospital encounter of 03/08/13 (from the past 48 hour(s))  GLUCOSE, CAPILLARY     Status: None   Collection Time    03/08/13  4:21 PM      Result Value Range   Glucose-Capillary 88  70 - 99 mg/dL   Comment 1 Notify RN    TROPONIN I     Status: None   Collection Time    03/08/13  5:09 PM      Result Value Range   Troponin I <0.30  <0.30 ng/mL   Comment:            Due to the release kinetics of cTnI,     a negative result within the first hours     of the onset of symptoms does not rule out     myocardial infarction with certainty.     If myocardial infarction is  still suspected,     repeat the test at appropriate intervals.  HEMOGLOBIN A1C     Status: Abnormal   Collection Time    03/08/13  6:00 PM      Result Value Range   Hemoglobin A1C 10.4 (*) <5.7 %   Comment: (NOTE)                                                                               According to the ADA Clinical Practice Recommendations for 2011, when     HbA1c is used as a screening test:      >=6.5%   Diagnostic of Diabetes Mellitus               (if abnormal result is confirmed)     5.7-6.4%   Increased risk of developing Diabetes Mellitus     References:Diagnosis and Classification of Diabetes Mellitus,Diabetes     Care,2011,34(Suppl 1):S62-S69 and Standards of Medical Care in             Diabetes - 2011,Diabetes Care,2011,34  (Suppl 1):S11-S61.   Mean Plasma Glucose 252 (*) <117 mg/dL   Comment: Performed at [facility]  GLUCOSE, CAPILLARY     Status: Abnormal   Collection Time    03/08/13  9:24 PM      Result Value Range   Glucose-Capillary 208 (*) 70 - 99 mg/dL   Comment 1 Notify RN    GLUCOSE, CAPILLARY     Status: Abnormal   Collection Time    03/09/13  6:17 AM      Result Value Range   Glucose-Capillary 195 (*) 70 - 99 mg/dL  BASIC METABOLIC PANEL     Status: Abnormal   Collection Time    03/09/13  6:25 AM      Result Value Range   Sodium 140  135 - 145 mEq/L   Potassium 4.2  3.5 - 5.1 mEq/L   Chloride 99  96 - 112 mEq/L   CO2 30  19 - 32 mEq/L   Glucose, Bld 220 (*) 70 - 99 mg/dL   BUN 20  6 - 23 mg/dL   Creatinine, Ser 4.40  0.50 - 1.10 mg/dL   Calcium 9.5  8.4 - 34.7 mg/dL   GFR calc non Af Amer 90 (*) >90 mL/min   GFR calc Af Amer >90  >90 mL/min   Comment: (NOTE)     The eGFR has been calculated using the CKD EPI equation.     This calculation has not been validated in all clinical situations.     eGFR's persistently <90 mL/min signify possible Chronic Kidney     Disease.  GLUCOSE, CAPILLARY     Status: Abnormal   Collection Time    03/09/13 10:57 AM      Result Value Range   Glucose-Capillary 246 (*) 70 - 99 mg/dL   Comment 1 Notify RN    GLUCOSE, CAPILLARY     Status: Abnormal   Collection Time    03/09/13  4:11 PM      Result Value Range   Glucose-Capillary 185 (*) 70 - 99 mg/dL   Comment 1 Notify RN    GLUCOSE, CAPILLARY  Status: Abnormal   Collection Time    03/09/13  8:57 PM      Result Value Range   Glucose-Capillary 318 (*) 70 - 99 mg/dL   Comment 1 Notify RN     Comment 2 Documented in Chart    BASIC METABOLIC PANEL     Status: Abnormal   Collection Time    03/10/13  4:55 AM      Result Value Range   Sodium 138  135 - 145 mEq/L   Potassium 3.4 (*) 3.5 - 5.1 mEq/L   Comment: DELTA CHECK NOTED   Chloride 98  96 - 112 mEq/L   CO2 33 (*) 19 - 32 mEq/L    Glucose, Bld 209 (*) 70 - 99 mg/dL   BUN 21  6 - 23 mg/dL   Creatinine, Ser 2.13  0.50 - 1.10 mg/dL   Calcium 8.5  8.4 - 08.6 mg/dL   GFR calc non Af Amer >90  >90 mL/min   GFR calc Af Amer >90  >90 mL/min   Comment: (NOTE)     The eGFR has been calculated using the CKD EPI equation.     This calculation has not been validated in all clinical situations.     eGFR's persistently <90 mL/min signify possible Chronic Kidney     Disease.  MAGNESIUM     Status: None   Collection Time    03/10/13  4:55 AM      Result Value Range   Magnesium 1.8  1.5 - 2.5 mg/dL  GLUCOSE, CAPILLARY     Status: Abnormal   Collection Time    03/10/13  6:59 AM      Result Value Range   Glucose-Capillary 204 (*) 70 - 99 mg/dL   Comment 1 Notify RN     Comment 2 Documented in Chart    GLUCOSE, CAPILLARY     Status: Abnormal   Collection Time    03/10/13 10:53 AM      Result Value Range   Glucose-Capillary 146 (*) 70 - 99 mg/dL    No results found.  Positive for aggressive behavior, anxiety, bad mood, behavior problems and Dementia Blood pressure 109/57, pulse 80, temperature 98.5 F (36.9 C), temperature source Oral, resp. rate 18, weight 70.4 kg (155 lb 3.3 oz), SpO2 93.00%.   Assessment/Plan: Dementia with behavioral problems  Recommended: Patient does not meet criteria for capaucity to make her own medical decisions  Or living arrangements Refer to psych social service regarding medical care power of attorney paperwork if needed  Start Risperidone 0.25 mg 2 times daily as needed for agitation and aggressive behaviors Patient does not meet criteria for acute psychiatric hospitalization Appreciate Psychiatric consultation and followup as needed  Nehemiah Settle., M.D. 03/10/2013, 12:59 PM

## 2013-03-10 NOTE — Progress Notes (Signed)
[facility] PROGRESS NOTE  Dana [name] AVW:[id number] DOB: August 14, 1945 DOA: 03/08/2013 PCP: Romero Belling, MD  Brief narrative 68 y.o. female with hx of HTN, DM, hyperlipidemia, anxiety and depression, but no known CAD or CHF presented to ED with approximately three-week history of worsening bilateral leg edema, dyspnea on exertion, but no chest pain, orthopnea or PND. Family history positive for MI in father at age 4. Evaluation in the ER included a BNP of 1008. CXR with vascular congestion and bilateral effusion. She has normal renal fx tests and negative troponin. Her EKG showed new complete LBBB, where as in march 2014, she has intraventicular conduction delay. Hospitalist was asked to admit her for new EKG changes of LBBB, and for new onset of CHF.   Assessment/Plan:  CHF exacerbation/new cardiomyopathy/NSVT - Patient has no prior history of heart disease or CHF. - Improved after IV Lasix-continue. Diuresed ~ 4.6 liters since admission. - Cardiology followup appreciated-they plan to discuss with spouse today regarding further workup i.e. Cath - 2-D echo: LVEF: 25-30%, 8 (of the anteroseptal and apical myocardium. Hypokinesis of the inferior  myocardium & grade 1 diastolic dysfunction - Starting low dose Coreg 3.125 twice a day and if tolerates than start ACEI - Patient had 8 beat NSVT on 8/22 AM.  Hypokalemia - Replete and follow. Magnesium 1.8.  New LBBB - Troponin negative. - Followup 2-D echo-results as above. - No chest pain. Cardiology followup as above. - Continue aspirin  Hypertension - Reasonable inpatient control.  Type II DM - Uncontrolled. A1c was 15.8 in September 2013. FU repeat A1C - Continue NPH and sliding scale insulin. We'll likely need adjustment of her home insulin regimen. - A1C 10.4. - Add meal time Novolog. ? Add low dose NPH at bedtime.  Hyperlipidemia - Not on medications.  History of depression - Continue home medications  Dementia with  Agitation - On 8/20 afternoon she started getting progressively confused, agitated and attempting to get out  of bed. - Discussed with spouse who gives history of similar behavior at home, worse during the evening/night. - Treat with Ativan when necessary for agitation and placed safety sitter. - Improved this AM. Pt has no recollection. - RPR & TSH unremarkable in past - Psychiatry consulted at family's request. - Need to avoid medications that would precipitate prolonged QTC-patient had prolonged QTC on  admission and has cardiomyopathy.  Code Status: Full Family Communication: Discussed with spouse 8/20. Disposition Plan: Home when medically stable.   Consultants:  [facility]  Psychiatry-pending  Procedures:  None  Antibiotics:  None   HPI/Subjective: Denies dyspnea. Leg swelling improving. Per nursing, patient has had confusion and agitation which usually begins in the afternoons.  Objective: Filed Vitals:   03/09/13 0855 03/09/13 1351 03/09/13 2329 03/10/13 0511  BP:  131/84 123/72 109/57  Pulse:  102 79 80  Temp:   97.9 F (36.6 C) 98.5 F (36.9 C)  TempSrc:   Oral Oral  Resp:  18 18 18   Weight:    70.4 kg (155 lb 3.3 oz)  SpO2: 98% 98% 95% 93%    Intake/Output Summary (Last 24 hours) at 03/10/13 1019 Last data filed at 03/10/13 0825  Gross per 24 hour  Intake    600 ml  Output   1775 ml  Net  -1175 ml   Filed Weights   03/08/13 0456 03/09/13 0555 03/10/13 0511  Weight: 74.4 kg (164 lb 0.4 oz) 71.4 kg (157 lb 6.5 oz) 70.4 kg (155 lb 3.3  oz)    Exam:   General exam: Moderately built and nourished female comfortably lying in bed.  Respiratory system: Occasional basal crackles but improved since admission. Rest of lung fields clear to auscultation. No increased work of breathing.  Cardiovascular system: S1 & S2 heard, RRR. No JVD, murmurs, gallops, clicks. 1+ pitting bilateral leg edema to knees.  Gastrointestinal system: Abdomen is  nondistended, soft and nontender. Normal bowel sounds heard.  Central nervous system: Alert and oriented to person & place. No focal neurological deficits.  Extremities: Symmetric 5 x 5 power.   Data Reviewed: Basic Metabolic Panel:  Recent Labs Lab 03/07/13 2132 03/08/13 0624 03/09/13 0625 03/10/13 0455  NA 140  --  140 138  K 4.2  --  4.2 3.4*  CL 102  --  99 98  CO2 30  --  30 33*  GLUCOSE 376*  --  220* 209*  BUN 23  --  20 21  CREATININE 0.72 0.58 0.62 0.58  CALCIUM 8.9  --  9.5 8.5  MG  --   --   --  1.8   Liver Function Tests: No results found for this basename: AST, ALT, ALKPHOS, BILITOT, PROT, ALBUMIN,  in the last 168 hours No results found for this basename: LIPASE, AMYLASE,  in the last 168 hours No results found for this basename: AMMONIA,  in the last 168 hours CBC:  Recent Labs Lab 03/07/13 2132 03/08/13 0624  WBC 3.9* 4.6  HGB 13.5 14.1  HCT 40.0 41.0  MCV 84.4 83.5  PLT 325 320   Cardiac Enzymes:  Recent Labs Lab 03/08/13 0336 03/08/13 1100 03/08/13 1709  TROPONINI <0.30 <0.30 <0.30   BNP (last 3 results)  Recent Labs  03/07/13 2132  PROBNP 1008.0*   CBG:  Recent Labs Lab 03/09/13 0617 03/09/13 1057 03/09/13 1611 03/09/13 2057 03/10/13 0659  GLUCAP 195* 246* 185* 318* 204*    No results found for this or any previous visit (from the past 240 hour(s)).   Studies: No results found.   Additional labs:   Scheduled Meds: . aspirin EC  325 mg Oral Daily  . carvedilol  3.125 mg Oral BID WC  . citalopram  20 mg Oral Daily  . furosemide  60 mg Intravenous Q12H  . heparin  5,000 Units Subcutaneous Q8H  . insulin aspart  0-5 Units Subcutaneous QHS  . insulin aspart  0-9 Units Subcutaneous TID WC  . insulin NPH  14 Units Subcutaneous QAC breakfast  . sodium chloride  3 mL Intravenous Q12H   Continuous Infusions:   Principal Problem:   CHF, acute Active Problems:   DEPRESSION   Edema   Chest pain   Diabetes mellitus  type 2, uncontrolled   Noncompliance   Alzheimer's disease   Abnormal EKG   LBBB (left bundle branch block)   Acute systolic CHF (congestive heart failure)   Congestive dilated cardiomyopathy    Time spent: 25 minutes    [facility]  [facility] Pager [phone].   If 8PM-8AM, please contact night-coverage at [email or web address], password [facility] 03/10/2013, 10:19 AM  LOS: 2 days

## 2013-03-10 NOTE — Progress Notes (Signed)
Pt is alert and oriented x4. Pt is very forgetful. Sitter is at bedside. Pt is cooperative and following commands at this point. Will continue to monitor

## 2013-03-11 DIAGNOSIS — F0281 Dementia in other diseases classified elsewhere with behavioral disturbance: Secondary | ICD-10-CM

## 2013-03-11 DIAGNOSIS — F29 Unspecified psychosis not due to a substance or known physiological condition: Secondary | ICD-10-CM

## 2013-03-11 LAB — BASIC METABOLIC PANEL
Calcium: 8.7 mg/dL (ref 8.4–10.5)
Creatinine, Ser: 0.6 mg/dL (ref 0.50–1.10)
GFR calc Af Amer: >90 mL/min (ref >90)

## 2013-03-11 LAB — GLUCOSE, CAPILLARY: Glucose-Capillary: 291 mg/dL — ABNORMAL HIGH (ref 70–99)

## 2013-03-11 MED ORDER — OLANZAPINE 2.5 MG PO TABS
2.5000 mg | ORAL_TABLET | Freq: Every day | ORAL | Status: DC
  Administered 2013-03-11 – 2013-03-15 (×5): 2.5 mg via ORAL
  Filled 2013-03-11 (×6): qty 1

## 2013-03-11 MED ORDER — FUROSEMIDE 40 MG PO TABS
40.0000 mg | ORAL_TABLET | Freq: Two times a day (BID) | ORAL | Status: DC
  Administered 2013-03-11 – 2013-03-16 (×10): 40 mg via ORAL
  Filled 2013-03-11 (×14): qty 1

## 2013-03-11 NOTE — Progress Notes (Signed)
Dana Carriveau [id number]  Subjective The patient is a 67 year old female who was seen by psychiatry 2 days ago [place] mental status.  She was recommended to start Risperdal however due to increased QTc interval this physician requested a change in the medication.  Patient remains very confused and labile.  She was seen sitting at the nursing station eating food.  She is unable to provide any information.  She remains forgetful and disoriented.  Patient did not remember why she is here.  You do not know if this is hospital.  However there has been no irritability anger or severe mood swing seen.    Mental Status Examination Patient appears to be her stated age.  She is poorly cooperative.  He maintained poor eye contact.  She is poor historian.  She has difficulty remembering things.  Her attention concentration is poor.  Her insight judgment and impulse control remains poor.  She does not appear to be hallucinating however as the staff patient has episodes of anger and irritability.  Current Medication Current facility-administered medications:aspirin EC tablet 325 mg, 325 mg, Oral, Daily, Houston Siren, MD, 325 mg at 03/11/13 1038;  carvedilol (COREG) tablet 3.125 mg, 3.125 mg, Oral, BID WC, Kathleene Hazel, MD, 3.125 mg at 03/11/13 4098;  citalopram (CELEXA) tablet 20 mg, 20 mg, Oral, Daily, Houston Siren, MD, 20 mg at 03/11/13 1038;  furosemide (LASIX) tablet 40 mg, 40 mg, Oral, BID, Elease Etienne, MD heparin injection 5,000 Units, 5,000 Units, Subcutaneous, Q8H, Houston Siren, MD, 5,000 Units at 03/11/13 [phone];  insulin aspart (novoLOG) injection 0-5 Units, 0-5 Units, Subcutaneous, QHS, Houston Siren, MD, 2 Units at 03/10/13 2223;  insulin aspart (novoLOG) injection 0-9 Units, 0-9 Units, Subcutaneous, TID WC, Houston Siren, MD, 1 Units at 03/11/13 0700 insulin aspart (novoLOG) injection 3 Units, 3 Units, Subcutaneous, TID WC, Elease Etienne, MD, 3 Units at 03/11/13 0730;  insulin NPH (HUMULIN N,NOVOLIN N)  injection 14 Units, 14 Units, Subcutaneous, QAC breakfast, Houston Siren, MD, 14 Units at 03/11/13 0657;  insulin NPH (HUMULIN N,NOVOLIN N) injection 5 Units, 5 Units, Subcutaneous, QHS, Elease Etienne, MD, 5 Units at 03/10/13 2224 LORazepam (ATIVAN) injection 1-2 mg, 1-2 mg, Intravenous, Q6H PRN, Elease Etienne, MD, 2 mg at 03/10/13 1402;  sodium chloride 0.9 % injection 3 mL, 3 mL, Intravenous, Q12H, Houston Siren, MD, 3 mL at 03/11/13 1038   Assessment Axis I psychosis NOS  Plan Most of antipsychotic medication does cause increased QTc interval however consider Zyprexa 2.5 mg at bedtime.  Recommend that EKG monitor regularly.  Recommend to involve psychiatric social services regarding medical care and power of attorney.  At this time patient does not require inpatient psychiatric hospitalization.  Call psychiatric services if needed.

## 2013-03-11 NOTE — Progress Notes (Signed)
SUBJECTIVE:  No complaints today  OBJECTIVE:   Vitals:   Filed Vitals:   03/11/13 0907 03/11/13 0910 03/11/13 0914 03/11/13 0917  BP: 105/53 105/56 109/60 107/60  Pulse: 79 79 82 82  Temp:    97.8 F (36.6 C)  TempSrc:    Oral  Resp:      Weight:      SpO2:       I&O's:   Intake/Output Summary (Last 24 hours) at 03/11/13 1159 Last data filed at 03/11/13 0500  Gross per 24 hour  Intake    960 ml  Output   1850 ml  Net   -890 ml   TELEMETRY: Reviewed telemetry pt in NSR:     PHYSICAL EXAM General: Well developed, well nourished, in no acute distress Head: Eyes PERRLA, No xanthomas.   Normal cephalic and atramatic  Lungs:   Clear bilaterally to auscultation and percussion. Heart:   HRRR S1 S2 Pulses are 2+ & equal. Abdomen: Bowel sounds are positive, abdomen soft and non-tender without masses  Extremities:   1-2+ edema Neuro: Alert and oriented X 3. Psych:  Good affect, responds appropriately   LABS: Basic Metabolic Panel:  Recent Labs  16/10/96 0625 03/10/13 0455 03/11/13 0437  NA 140 138 138  K 4.2 3.4* 3.8  CL 99 98 99  CO2 30 33* 33*  GLUCOSE 220* 209* 156*  BUN 20 21 19   CREATININE 0.62 0.58 0.60  CALCIUM 9.5 8.5 8.7  MG  --  1.8  --    Liver Function Tests: No results found for this basename: AST, ALT, ALKPHOS, BILITOT, PROT, ALBUMIN,  in the last 72 hours No results found for this basename: LIPASE, AMYLASE,  in the last 72 hours CBC: No results found for this basename: WBC, NEUTROABS, HGB, HCT, MCV, PLT,  in the last 72 hours Cardiac Enzymes:  Recent Labs  03/08/13 1709  TROPONINI <0.30   BNP: No components found with this basename: POCBNP,  D-Dimer: No results found for this basename: DDIMER,  in the last 72 hours Hemoglobin A1C:  Recent Labs  03/08/13 1800  HGBA1C 10.4*     RADIOLOGY: Dg Chest 2 View  03/07/2013   *RADIOLOGY REPORT*  Clinical Data: Dizziness and chest pain.  CHEST - 2 VIEW  Comparison: 04/04/2012  Findings: The  cardiac enlargement with pulmonary vascular congestion, peribronchial thickening and perihilar edema or infiltration.  Bilateral small pleural effusions with basilar atelectasis or consolidation in both lungs.  Changes are all new since the previous study.  No pneumothorax.  Calcification of the aorta.  IMPRESSION: Pulmonary vascular congestion with perihilar edema, bilateral pleural effusions, and basilar atelectasis or infiltration.   Original Report Authenticated By: Burman Nieves, M.D.   ASSESSMENT AND PLAN:  1. Acute systolic CHF: Pt admitted with volume overload c/w CHF. Echo 03/09/13 with LVEF 25-30%. No significant valve disease. No prior known cardiac disease. ? Etiology of cardiomyopathy. Agree with continued IV Lasix. She continues to diurese well. (5.2 liters since admission). Given risk factors for CAD with new cardiomyopathy, she will need a right and left heart cath to better define her pressures. Timing of the cardiac cath will have to be arranged after discussion with family and patient. She has been very agitated and combative at times with nursing staff as documented in the chart. Given dementia, this will complicate her planning of cardiac cath. Continue Coreg 3.125 mg po BID. Will follow BP and see if she tolerates the beta blocker before adding an  Ace-inh. Would continue diuresis for now as you are doing.  2. LBBB  3. Hypokalemia: Repleted      Quintella Reichert, MD  03/11/2013  11:59 AM

## 2013-03-11 NOTE — Progress Notes (Signed)
[facility] PROGRESS NOTE  Dana [name] YQM:[id number] DOB: 12-Nov-1945 DOA: 03/08/2013 PCP: Romero Belling, MD  Brief narrative 67 y.o. female with hx of HTN, DM, hyperlipidemia, anxiety and depression, but no known CAD or CHF presented to ED with approximately three-week history of worsening bilateral leg edema, dyspnea on exertion, but no chest pain, orthopnea or PND. Family history positive for MI in father at age 58. Evaluation in the ER included a BNP of 1008. CXR with vascular congestion and bilateral effusion. She has normal renal fx tests and negative troponin. Her EKG showed new complete LBBB, where as in march 2014, she has intraventicular conduction delay. Hospitalist was asked to admit her for new EKG changes of LBBB, and for new onset of CHF.   Assessment/Plan:  CHF exacerbation/new cardiomyopathy/NSVT - Patient has no prior history of heart disease or CHF. - Improved after IV Lasix-continue. Diuresed ~ 4.9 liters since admission. Changed to PO Lasix on 8/23- complains of  some  dizziness and soft BP's - Cardiology followup appreciated-they plan to discuss with spouse today regarding further workup i.e. Cath - 2-D echo: LVEF: 25-30%, 8 (of the anteroseptal and apical myocardium. Hypokinesis of the inferior myocardium & grade1  diastolic dysfunction - Started low dose Coreg 3.125 twice a day and if tolerates than start ACEI - Patient had 8 beat NSVT on 8/22 AM.   Hypokalemia - Replete and follow. Magnesium 1.8. Improved.  New LBBB - Troponin negative. - 2-D echo-results as above. - No chest pain. Cardiology followup as above. - Continue aspirin  Hypertension - Reasonable inpatient control. Soft BP- not orthostatic. Change Lasix to PO and monitor  Type II DM - Uncontrolled. A1c was 15.8 in September 2013. FU repeat A1C - Continue NPH and sliding scale insulin. We'll likely need adjustment of her home insulin regimen. - A1C 10.4. - Added meal time Novolog & low dose  NPH at bedtime. Better  Hyperlipidemia - Not on medications.  History of depression - Continue home medications- Celexa  Dementia with Agitation - On 8/20 afternoon she started getting progressively confused, agitated and attempting to get out  of bed. - Discussed with spouse who gives history of similar behavior at home, worse during the evening/night. - Treat with Ativan when necessary for agitation and placed safety sitter. - Improved this AM. Pt has no recollection. - RPR & TSH unremarkable in past - Psychiatry consulted at family's request. - Need to avoid medications that would precipitate prolonged QTC-patient had prolonged QTC on  admission and has cardiomyopathy. -  Psychiatry consult & FU appreciated- will start low dose Zyprexa HS per their recommendations.  Code Status: Full Family Communication: Discussed with spouse 8/20. Disposition Plan: Home when medically stable.   Consultants:  [facility]  Psychiatry  Procedures:  None  Antibiotics:  None   HPI/Subjective: Denies complaints. Intermittent agitation in afternoons.  Objective: Filed Vitals:   03/11/13 0910 03/11/13 0914 03/11/13 0917 03/11/13 1358  BP: 105/56 109/60 107/60 114/63  Pulse: 79 82 82 81  Temp:   97.8 F (36.6 C) 98.5 F (36.9 C)  TempSrc:   Oral Oral  Resp:    18  Weight:      SpO2:    93%    Intake/Output Summary (Last 24 hours) at 03/11/13 1413 Last data filed at 03/11/13 1257  Gross per 24 hour  Intake   1200 ml  Output   1850 ml  Net   -650 ml   Filed Weights   03/09/13  1610 03/10/13 0511 03/11/13 0506  Weight: 71.4 kg (157 lb 6.5 oz) 70.4 kg (155 lb 3.3 oz) 68.584 kg (151 lb 3.2 oz)    Exam:   General exam: Moderately built and nourished female comfortably lying in bed.  Respiratory system: Occasional basal crackles but improved since admission. Rest of lung fields clear to auscultation. No increased work of breathing.  Cardiovascular system: S1 & S2  heard, RRR. No JVD, murmurs, gallops, clicks. 1+ pitting bilateral leg edema to knees- continues to improve..  Gastrointestinal system: Abdomen is nondistended, soft and nontender. Normal bowel sounds heard.  Central nervous system: Alert and oriented to person & place. No focal neurological deficits.  Extremities: Symmetric 5 x 5 power.   Data Reviewed: Basic Metabolic Panel:  Recent Labs Lab 03/07/13 2132 03/08/13 0624 03/09/13 0625 03/10/13 0455 03/11/13 0437  NA 140  --  140 138 138  K 4.2  --  4.2 3.4* 3.8  CL 102  --  99 98 99  CO2 30  --  30 33* 33*  GLUCOSE 376*  --  220* 209* 156*  BUN 23  --  20 21 19   CREATININE 0.72 0.58 0.62 0.58 0.60  CALCIUM 8.9  --  9.5 8.5 8.7  MG  --   --   --  1.8  --    Liver Function Tests: No results found for this basename: AST, ALT, ALKPHOS, BILITOT, PROT, ALBUMIN,  in the last 168 hours No results found for this basename: LIPASE, AMYLASE,  in the last 168 hours No results found for this basename: AMMONIA,  in the last 168 hours CBC:  Recent Labs Lab 03/07/13 2132 03/08/13 0624  WBC 3.9* 4.6  HGB 13.5 14.1  HCT 40.0 41.0  MCV 84.4 83.5  PLT 325 320   Cardiac Enzymes:  Recent Labs Lab 03/08/13 0336 03/08/13 1100 03/08/13 1709  TROPONINI <0.30 <0.30 <0.30   BNP (last 3 results)  Recent Labs  03/07/13 2132  PROBNP 1008.0*   CBG:  Recent Labs Lab 03/10/13 1053 03/10/13 1600 03/10/13 2101 03/11/13 0709 03/11/13 1108  GLUCAP 146* 128* 242* 138* 155*    No results found for this or any previous visit (from the past 240 hour(s)).   Studies: No results found.   Additional labs:   Scheduled Meds: . aspirin EC  325 mg Oral Daily  . carvedilol  3.125 mg Oral BID WC  . citalopram  20 mg Oral Daily  . furosemide  40 mg Oral BID  . heparin  5,000 Units Subcutaneous Q8H  . insulin aspart  0-5 Units Subcutaneous QHS  . insulin aspart  0-9 Units Subcutaneous TID WC  . insulin aspart  3 Units Subcutaneous  TID WC  . insulin NPH  14 Units Subcutaneous QAC breakfast  . insulin NPH  5 Units Subcutaneous QHS  . sodium chloride  3 mL Intravenous Q12H   Continuous Infusions:   Principal Problem:   Acute systolic CHF (congestive heart failure) Active Problems:   DEPRESSION   Edema   Chest pain   Hypokalemia   Diabetes mellitus type 2, uncontrolled   Noncompliance   Alzheimer's disease   Abnormal EKG   LBBB (left bundle branch block)   Congestive dilated cardiomyopathy    Time spent: 25 minutes    [facility]  [facility] Pager [phone].   If 8PM-8AM, please contact night-coverage at [email or web address], password [facility] 03/11/2013, 2:13 PM  LOS: 3 days

## 2013-03-11 NOTE — Progress Notes (Signed)
Husband and sitter at bedside.  Mild rales at base of right lung.

## 2013-03-12 LAB — GLUCOSE, CAPILLARY
Glucose-Capillary: 175 mg/dL — ABNORMAL HIGH (ref 70–99)
Glucose-Capillary: 154 mg/dL — ABNORMAL HIGH (ref 70–99)
Glucose-Capillary: 242 mg/dL — ABNORMAL HIGH (ref 70–99)

## 2013-03-12 MED ORDER — LISINOPRIL 2.5 MG PO TABS
2.5000 mg | ORAL_TABLET | Freq: Every day | ORAL | Status: DC
  Administered 2013-03-12 – 2013-03-16 (×5): 2.5 mg via ORAL
  Filled 2013-03-12 (×5): qty 1

## 2013-03-12 NOTE — Progress Notes (Signed)
[facility] PROGRESS NOTE  Dana [name] YNW:[id number] DOB: July 31, 1945 DOA: 03/08/2013 PCP: Romero Belling, MD  Brief narrative 67 y.o. female with hx of HTN, DM, hyperlipidemia, anxiety and depression, but no known CAD or CHF presented to ED with approximately three-week history of worsening bilateral leg edema, dyspnea on exertion, but no chest pain, orthopnea or PND. Family history positive for MI in father at age 52. Evaluation in the ER included a BNP of 1008. CXR with vascular congestion and bilateral effusion. She has normal renal fx tests and negative troponin. Her EKG showed new complete LBBB, where as in march 2014, she has intraventicular conduction delay. Hospitalist was asked to admit her for new EKG changes of LBBB, and for new onset of CHF.   Assessment/Plan:  CHF exacerbation/new cardiomyopathy/NSVT - Patient has no prior history of heart disease or CHF. - Improved after IV Lasix-continue. Diuresed ~ 4.9 liters since admission. Changed to PO Lasix on 8/23- complains of some  dizziness and soft BP's - Cardiology followup appreciated-they plan to discuss with spouse today regarding further workup i.e. Cath - 2-D echo: LVEF: 25-30%, 8 (of the anteroseptal and apical myocardium. Hypokinesis of the inferior myocardium & grade1  diastolic dysfunction - Started low dose Coreg 3.125 twice a day and if tolerates than start ACEI- starting 8/24 - Patient had 8 beat NSVT on 8/22 AM.   Hypokalemia - Replete and follow. Magnesium 1.8. Improved.  New LBBB - Troponin negative. - 2-D echo-results as above. - No chest pain. Cardiology followup as above. - Continue aspirin  Hypertension - Reasonable inpatient control. Soft BP- not orthostatic. Change Lasix to PO and monitor  Type II DM - Uncontrolled. A1c was 15.8 in September 2013. FU repeat A1C - Continue NPH and sliding scale insulin. We'll likely need adjustment of her home insulin regimen. - A1C 10.4. - Added meal time  Novolog & low dose NPH at bedtime. Better  Hyperlipidemia - Not on medications.  History of depression - Continue home medications- Celexa  Dementia with Agitation - On 8/20 afternoon she started getting progressively confused, agitated and attempting to get out  of bed. - Discussed with spouse who gives history of similar behavior at home, worse during the evening/night. - Treat with Ativan when necessary for agitation and placed safety sitter. - Improved this AM. Pt has no recollection. - RPR & TSH unremarkable in past - Psychiatry consulted at family's request. - Need to avoid medications that would precipitate prolonged QTC-patient had prolonged QTC on  admission and has  cardiomyopathy. -  Psychiatry consult & FU appreciated- will start low dose Zyprexa HS per their recommendations. Better- less agitation overnight and today  Code Status: Full Family Communication: Discussed with spouse 8/20. Disposition Plan: Home when medically stable.   Consultants:  [facility]  Psychiatry  Procedures:  None  Antibiotics:  None   HPI/Subjective: Denies complaints. Per nursing, less agitation.  Objective: Filed Vitals:   03/11/13 2206 03/12/13 0532 03/12/13 1105 03/12/13 1336  BP: 139/62 120/70 107/61 93/49  Pulse: 56 84 74 81  Temp: 98 F (36.7 C) 98.2 F (36.8 C) 97.6 F (36.4 C) 98.2 F (36.8 C)  TempSrc: Oral Oral Oral Oral  Resp: 20 20 20 20   Weight:  68.6 kg (151 lb 3.8 oz)    SpO2: 100% 98% 94% 97%    Intake/Output Summary (Last 24 hours) at 03/12/13 1720 Last data filed at 03/12/13 1654  Gross per 24 hour  Intake   1205 ml  Output      0 ml  Net   1205 ml   Filed Weights   03/10/13 0511 03/11/13 0506 03/12/13 0532  Weight: 70.4 kg (155 lb 3.3 oz) 68.584 kg (151 lb 3.2 oz) 68.6 kg (151 lb 3.8 oz)    Exam:   General exam: Moderately built and nourished female comfortably lying in bed.  Respiratory system: clear to auscultation. No increased  work of breathing.  Cardiovascular system: S1 & S2 heard, RRR. No JVD, murmurs, gallops, clicks. 1+ pitting bilateral leg edema to knees- continues to improve..  Gastrointestinal system: Abdomen is nondistended, soft and nontender. Normal bowel sounds heard.  Central nervous system: Alert and oriented to person & place. No focal neurological deficits.  Extremities: Symmetric 5 x 5 power.   Data Reviewed: Basic Metabolic Panel:  Recent Labs Lab 03/07/13 2132 03/08/13 0624 03/09/13 0625 03/10/13 0455 03/11/13 0437  NA 140  --  140 138 138  K 4.2  --  4.2 3.4* 3.8  CL 102  --  99 98 99  CO2 30  --  30 33* 33*  GLUCOSE 376*  --  220* 209* 156*  BUN 23  --  20 21 19   CREATININE 0.72 0.58 0.62 0.58 0.60  CALCIUM 8.9  --  9.5 8.5 8.7  MG  --   --   --  1.8  --    Liver Function Tests: No results found for this basename: AST, ALT, ALKPHOS, BILITOT, PROT, ALBUMIN,  in the last 168 hours No results found for this basename: LIPASE, AMYLASE,  in the last 168 hours No results found for this basename: AMMONIA,  in the last 168 hours CBC:  Recent Labs Lab 03/07/13 2132 03/08/13 0624  WBC 3.9* 4.6  HGB 13.5 14.1  HCT 40.0 41.0  MCV 84.4 83.5  PLT 325 320   Cardiac Enzymes:  Recent Labs Lab 03/08/13 0336 03/08/13 1100 03/08/13 1709  TROPONINI <0.30 <0.30 <0.30   BNP (last 3 results)  Recent Labs  03/07/13 2132  PROBNP 1008.0*   CBG:  Recent Labs Lab 03/11/13 1558 03/11/13 2117 03/12/13 0610 03/12/13 1126 03/12/13 1558  GLUCAP 245* 291* 175* 229* 154*    No results found for this or any previous visit (from the past 240 hour(s)).   Studies: No results found.   Additional labs:   Scheduled Meds: . aspirin EC  325 mg Oral Daily  . carvedilol  3.125 mg Oral BID WC  . citalopram  20 mg Oral Daily  . furosemide  40 mg Oral BID  . heparin  5,000 Units Subcutaneous Q8H  . insulin aspart  0-5 Units Subcutaneous QHS  . insulin aspart  0-9 Units  Subcutaneous TID WC  . insulin aspart  3 Units Subcutaneous TID WC  . insulin NPH  14 Units Subcutaneous QAC breakfast  . insulin NPH  5 Units Subcutaneous QHS  . lisinopril  2.5 mg Oral Daily  . OLANZapine  2.5 mg Oral QHS  . sodium chloride  3 mL Intravenous Q12H   Continuous Infusions:   Principal Problem:   Acute systolic CHF (congestive heart failure) Active Problems:   DEPRESSION   Edema   Chest pain   Hypokalemia   Diabetes mellitus type 2, uncontrolled   Noncompliance   Alzheimer's disease   Abnormal EKG   LBBB (left bundle branch block)   Congestive dilated cardiomyopathy    Time spent: 25 minutes    [facility]  [facility] Pager [phone].  If 8PM-8AM, please contact night-coverage at [email or web address], password [facility] 03/12/2013, 5:20 PM  LOS: 4 days

## 2013-03-12 NOTE — Progress Notes (Addendum)
SUBJECTIVE:  No complaints  OBJECTIVE:   Vitals:   Filed Vitals:   03/11/13 1358 03/11/13 2206 03/12/13 0532 03/12/13 1105  BP: 114/63 139/62 120/70 107/61  Pulse: 81 56 84 74  Temp: 98.5 F (36.9 C) 98 F (36.7 C) 98.2 F (36.8 C) 97.6 F (36.4 C)  TempSrc: Oral Oral Oral Oral  Resp: 18 20 20 20   Weight:   68.6 kg (151 lb 3.8 oz)   SpO2: 93% 100% 98% 94%   I&O's:   Intake/Output Summary (Last 24 hours) at 03/12/13 1123 Last data filed at 03/12/13 1107  Gross per 24 hour  Intake   1225 ml  Output      0 ml  Net   1225 ml   TELEMETRY: Reviewed telemetry pt in NSR:     PHYSICAL EXAM General: Well developed, well nourished, in no acute distress Head: Eyes PERRLA, No xanthomas.   Normal cephalic and atramatic  Lungs:   Clear bilaterally to auscultation and percussion. Heart:   HRRR S1 S2 Pulses are 2+ & equal. Abdomen: Bowel sounds are positive, abdomen soft and non-tender without masses  Extremities:   No clubbing, cyanosis or edema.  DP +1 Neuro: Alert and oriented X 3. Psych:  Good affect, responds appropriately   LABS: Basic Metabolic Panel:  Recent Labs  16/10/96 0455 03/11/13 0437  NA 138 138  K 3.4* 3.8  CL 98 99  CO2 33* 33*  GLUCOSE 209* 156*  BUN 21 19  CREATININE 0.58 0.60  CALCIUM 8.5 8.7  MG 1.8  --      RADIOLOGY: Dg Chest 2 View  03/07/2013   *RADIOLOGY REPORT*  Clinical Data: Dizziness and chest pain.  CHEST - 2 VIEW  Comparison: 04/04/2012  Findings: The cardiac enlargement with pulmonary vascular congestion, peribronchial thickening and perihilar edema or infiltration.  Bilateral small pleural effusions with basilar atelectasis or consolidation in both lungs.  Changes are all new since the previous study.  No pneumothorax.  Calcification of the aorta.  IMPRESSION: Pulmonary vascular congestion with perihilar edema, bilateral pleural effusions, and basilar atelectasis or infiltration.   Original Report Authenticated By: Burman Nieves, M.D.    ASSESSMENT AND PLAN:  1. Acute systolic CHF: Pt admitted with volume overload c/w CHF. Echo 03/09/13 with LVEF 25-30%. No significant valve disease. No prior known cardiac disease. ? Etiology of cardiomyopathy.  She continues to diurese well and appears euvolemic. (4 liters since admission). Given risk factors for CAD with new cardiomyopathy, she will need a right and left heart cath to better define her pressures. Timing of the cardiac cath will have to be arranged after discussion with family and patient. She has been very agitated and combative at times with nursing staff as documented in the chart. Given dementia, this will complicate her planning of cardiac cath. Continue Coreg 3.125 mg po BID. She is tolerating the beta blocker well.  Will try adding low dose Ace-inh. She appears euvolemic so will continue with PO Lasix 2. LBBB  3. Hypokalemia: Repleted     Quintella Reichert, MD  03/12/2013  11:23 AM

## 2013-03-13 LAB — GLUCOSE, CAPILLARY: Glucose-Capillary: 234 mg/dL — ABNORMAL HIGH (ref 70–99)

## 2013-03-13 MED ORDER — INSULIN NPH (HUMAN) (ISOPHANE) 100 UNIT/ML ~~LOC~~ SUSP
14.0000 [IU] | Freq: Every day | SUBCUTANEOUS | Status: DC
  Administered 2013-03-14 – 2013-03-16 (×3): 14 [IU] via SUBCUTANEOUS

## 2013-03-13 NOTE — Progress Notes (Signed)
[facility] PROGRESS NOTE  Dana [name] JYN:[id number] DOB: 04-25-1946 DOA: 03/08/2013 PCP: Romero Belling, MD  Brief narrative 67 y.o. female with hx of HTN, DM, hyperlipidemia, anxiety and depression, but no known CAD or CHF presented to ED with approximately three-week history of worsening bilateral leg edema, dyspnea on exertion, but no chest pain, orthopnea or PND. Family history positive for MI in father at age 69. Evaluation in the ER included a BNP of 1008. CXR with vascular congestion and bilateral effusion. She has normal renal fx tests and negative troponin. Her EKG showed new complete LBBB, where as in march 2014, she has intraventicular conduction delay. Hospitalist was asked to admit her for new EKG changes of LBBB, and for new onset of CHF.   Assessment/Plan:  CHF exacerbation (acute systolic)/new cardiomyopathy/NSVT - Patient has no prior history of heart disease or CHF. - Improved after IV Lasix-continue. Diuresed ~ 4.9 liters since admission. Changed to PO Lasix on 8/23. - Cardiology followup appreciated-they plan to discuss with spouse today regarding further workup i.e. Cath - 2-D echo: LVEF: 25-30%, 8 (of the anteroseptal and apical myocardium. Hypokinesis of the inferior myocardium & grade1  diastolic dysfunction - Started low dose Coreg 3.125 twice a day and if tolerates than start ACEI- starting 8/24 - Patient had 8 beat NSVT on 8/22 AM. - Improved. I/O's probably not accurate. Weight 74.4 kg admission >68.4 today. - Discussed with cardiology: will d/w family and determine if cath or any further IP ischemic work up will be done or can be  DCed to OP follow.    Hypokalemia - Repleted. Magnesium 1.8.  New LBBB - Troponin negative. - 2-D echo-results as above. - No chest pain. Cardiology followup as above. - Continue aspirin  Hypertension - Reasonable inpatient control. Soft BP- not orthostatic. Change Lasix to PO and monitor  Type II DM - Uncontrolled.  A1c was 15.8 in September 2013.  - Continue NPH and sliding scale insulin.  - A1C 10.4. - Added meal time Novolog & low dose NPH at bedtime. Better  Hyperlipidemia - Not on medications.  History of depression - Continue home medications- Celexa  Dementia with Agitation - On 8/20 afternoon she started getting progressively confused, agitated and attempting to get out  of bed. - Discussed with spouse who gives history of similar behavior at home, worse during the evening/night. - Treat with Ativan when necessary for agitation and placed safety sitter. - RPR & TSH unremarkable in past - Psychiatry consulted at family's request. - Need to avoid medications that would precipitate prolonged QTC-patient had prolonged QTC on  admission and has  cardiomyopathy. - Psychiatry consult & FU appreciated- will start low dose Zyprexa HS per their recommendations. Better- No reported  Agitation.  - Patient lacks capacity to make medical decisions.  Code Status: Full Family Communication: Discussed with spouse 8/20. Disposition Plan: Home when medically stable.   Consultants:  [facility]  Psychiatry  Procedures:  None  Antibiotics:  None   HPI/Subjective: Denies complaints.   Objective: Filed Vitals:   03/12/13 1727 03/12/13 2037 03/13/13 0547 03/13/13 1043  BP: 98/64 110/68 98/49 100/54  Pulse: 84 87 75 77  Temp:  98.3 F (36.8 C) 98.2 F (36.8 C)   TempSrc:  Oral Oral   Resp:  19 20 19   Height:   5\' 3"  (1.6 m)   Weight:   68.4 kg (150 lb 12.7 oz)   SpO2:  97% 95% 94%    Intake/Output Summary (Last  24 hours) at 03/13/13 1433 Last data filed at 03/13/13 0900  Gross per 24 hour  Intake   1200 ml  Output      0 ml  Net   1200 ml   Filed Weights   03/11/13 0506 03/12/13 0532 03/13/13 0547  Weight: 68.584 kg (151 lb 3.2 oz) 68.6 kg (151 lb 3.8 oz) 68.4 kg (150 lb 12.7 oz)    Exam:   General exam: Moderately built and nourished female comfortably lying in  bed.  Respiratory system: clear to auscultation. No increased work of breathing.  Cardiovascular system: S1 & S2 heard, RRR. No JVD, murmurs, gallops, clicks. 1+ pitting bilateral leg edema to knees  Gastrointestinal system: Abdomen is nondistended, soft and nontender. Normal bowel sounds heard.  Central nervous system: Alert and oriented to person & place. No focal neurological deficits.  Extremities: Symmetric 5 x 5 power.   Data Reviewed: Basic Metabolic Panel:  Recent Labs Lab 03/07/13 2132 03/08/13 0624 03/09/13 0625 03/10/13 0455 03/11/13 0437  NA 140  --  140 138 138  K 4.2  --  4.2 3.4* 3.8  CL 102  --  99 98 99  CO2 30  --  30 33* 33*  GLUCOSE 376*  --  220* 209* 156*  BUN 23  --  20 21 19   CREATININE 0.72 0.58 0.62 0.58 0.60  CALCIUM 8.9  --  9.5 8.5 8.7  MG  --   --   --  1.8  --    Liver Function Tests: No results found for this basename: AST, ALT, ALKPHOS, BILITOT, PROT, ALBUMIN,  in the last 168 hours No results found for this basename: LIPASE, AMYLASE,  in the last 168 hours No results found for this basename: AMMONIA,  in the last 168 hours CBC:  Recent Labs Lab 03/07/13 2132 03/08/13 0624  WBC 3.9* 4.6  HGB 13.5 14.1  HCT 40.0 41.0  MCV 84.4 83.5  PLT 325 320   Cardiac Enzymes:  Recent Labs Lab 03/08/13 0336 03/08/13 1100 03/08/13 1709  TROPONINI <0.30 <0.30 <0.30   BNP (last 3 results)  Recent Labs  03/07/13 2132  PROBNP 1008.0*   CBG:  Recent Labs Lab 03/12/13 1126 03/12/13 1558 03/12/13 2122 03/13/13 0606 03/13/13 1142  GLUCAP 229* 154* 242* 162* 181*    No results found for this or any previous visit (from the past 240 hour(s)).   Studies: No results found.   Additional labs:   Scheduled Meds: . aspirin EC  325 mg Oral Daily  . carvedilol  3.125 mg Oral BID WC  . citalopram  20 mg Oral Daily  . furosemide  40 mg Oral BID  . heparin  5,000 Units Subcutaneous Q8H  . insulin aspart  0-5 Units Subcutaneous  QHS  . insulin aspart  0-9 Units Subcutaneous TID WC  . insulin aspart  3 Units Subcutaneous TID WC  . insulin NPH  14 Units Subcutaneous QAC breakfast  . insulin NPH  5 Units Subcutaneous QHS  . lisinopril  2.5 mg Oral Daily  . OLANZapine  2.5 mg Oral QHS  . sodium chloride  3 mL Intravenous Q12H   Continuous Infusions:   Principal Problem:   Acute systolic CHF (congestive heart failure) Active Problems:   DEPRESSION   Edema   Chest pain   Hypokalemia   Diabetes mellitus type 2, uncontrolled   Noncompliance   Alzheimer's disease   Abnormal EKG   LBBB (left bundle branch block)  Congestive dilated cardiomyopathy    Time spent: 20 minutes    [facility]  [facility] Pager [phone].   If 8PM-8AM, please contact night-coverage at [email or web address], password [facility] 03/13/2013, 2:33 PM  LOS: 5 days

## 2013-03-13 NOTE — Progress Notes (Signed)
Pt no complaints of SOB or CP during Shift. Pt was not combative during shift. Pt NPO at midnight for stress test Will continue to monitor Pt.

## 2013-03-13 NOTE — Progress Notes (Signed)
Subjective: Patient is sleepy  In/out of sleep while being examined.  Denies SOB  No CP. Objective: Filed Vitals:   03/12/13 1336 03/12/13 1727 03/12/13 2037 03/13/13 0547  BP: 93/49 98/64 110/68 98/49  Pulse: 81 84 87 75  Temp: 98.2 F (36.8 C)  98.3 F (36.8 C) 98.2 F (36.8 C)  TempSrc: Oral  Oral Oral  Resp: 20  19 20   Weight:    150 lb 12.7 oz (68.4 kg)  SpO2: 97%  97% 95%   Weight change: -7.1 oz (-0.2 kg)  Intake/Output Summary (Last 24 hours) at 03/13/13 0734 Last data filed at 03/12/13 2039  Gross per 24 hour  Intake   1583 ml  Output      0 ml  Net   1583 ml   I/O incomplete  Output not recorded yesterdya.  General: Alert, awake, oriented x3, in no acute distress Neck:  JVP is normal Heart: Regular rate and rhythm, without murmurs, rubs, gallops.  Lungs: Clear to auscultation.  No rales or wheezes. Exemities:  No edema.   Neuro: Grossly intact, nonfocal.  Tele:  SR  Lab Results: Results for orders placed during the hospital encounter of 03/08/13 (from the past 24 hour(s))  GLUCOSE, CAPILLARY     Status: Abnormal   Collection Time    03/12/13 11:26 AM      Result Value Range   Glucose-Capillary 229 (*) 70 - 99 mg/dL  GLUCOSE, CAPILLARY     Status: Abnormal   Collection Time    03/12/13  3:58 PM      Result Value Range   Glucose-Capillary 154 (*) 70 - 99 mg/dL  GLUCOSE, CAPILLARY     Status: Abnormal   Collection Time    03/12/13  9:22 PM      Result Value Range   Glucose-Capillary 242 (*) 70 - 99 mg/dL   Comment 1 Notify RN      Studies/Results: @RISRSLT24 @  Medications: Reviewed   @PROBHOSP @  1.  Acute systolic CHF.  LVEF 25 to 30%  Volume overall is not bad  Patient comfortable lying flat. Should have R and L heart cath  MS is holding this up    2.  LBBB  3.  DM  4.  Neuro.  Patient nods in/out while being examined.  Will need to contact family  Not sure if she would be compliant with cath.  LOS: 5 days   Dietrich Pates 03/13/2013, 7:34  AM

## 2013-03-13 NOTE — Plan of Care (Signed)
Problem: Phase I Progression Outcomes Goal: EF % per last Echo/documented,Core Reminder form on chart Outcome: Completed/Met Date Met:  03/13/13 25-30%

## 2013-03-13 NOTE — Progress Notes (Signed)
Spoke with patient and her husband  I am concerned about confusion and agitation during cath I would recomm lexiscan MIBI to rule out ischemia.   Written for AM  Husband concerned about taking care of her at home  [facility] now to come by 1 x per week  He says that is not enough.

## 2013-03-14 ENCOUNTER — Inpatient Hospital Stay (HOSPITAL_COMMUNITY): Payer: Medicare Other

## 2013-03-14 DIAGNOSIS — R079 Chest pain, unspecified: Secondary | ICD-10-CM

## 2013-03-14 LAB — BASIC METABOLIC PANEL
Calcium: 8.7 mg/dL (ref 8.4–10.5)
GFR calc Af Amer: >90 mL/min (ref >90)
GFR calc non Af Amer: 84 mL/min — ABNORMAL LOW (ref >90)
Glucose, Bld: 100 mg/dL — ABNORMAL HIGH (ref 70–99)
Potassium: 3.7 mEq/L (ref 3.5–5.1)
Sodium: 141 mEq/L (ref 135–145)

## 2013-03-14 LAB — GLUCOSE, CAPILLARY
Glucose-Capillary: 89 mg/dL (ref 70–99)
Glucose-Capillary: 90 mg/dL (ref 70–99)
Glucose-Capillary: 133 mg/dL — ABNORMAL HIGH (ref 70–99)
Glucose-Capillary: 116 mg/dL — ABNORMAL HIGH (ref 70–99)
Glucose-Capillary: 300 mg/dL — ABNORMAL HIGH (ref 70–99)

## 2013-03-14 MED ORDER — REGADENOSON 0.4 MG/5ML IV SOLN
INTRAVENOUS | Status: AC
  Filled 2013-03-14: qty 5

## 2013-03-14 MED ORDER — ACETAMINOPHEN 325 MG PO TABS
650.0000 mg | ORAL_TABLET | Freq: Four times a day (QID) | ORAL | Status: DC | PRN
Start: 2013-03-14 — End: 2013-03-16
  Administered 2013-03-14: 650 mg via ORAL
  Filled 2013-03-14: qty 2

## 2013-03-14 MED ORDER — DEXTROSE 50 % IV SOLN: 25.0000 mL | Freq: Once | INTRAVENOUS | Status: AC | PRN

## 2013-03-14 MED ORDER — TECHNETIUM TC 99M SESTAMIBI GENERIC - CARDIOLITE
10.0000 | Freq: Once | INTRAVENOUS | Status: AC | PRN
  Administered 2013-03-14: 10 via INTRAVENOUS

## 2013-03-14 MED ORDER — GLUCOSE 40 % PO GEL: 1.0000 | ORAL | Status: DC | PRN

## 2013-03-14 MED ORDER — TECHNETIUM TC 99M SESTAMIBI GENERIC - CARDIOLITE
30.0000 | Freq: Once | INTRAVENOUS | Status: AC | PRN
  Administered 2013-03-14: 30 via INTRAVENOUS

## 2013-03-14 MED ORDER — DIVALPROEX SODIUM 125 MG PO CPSP
125.0000 mg | ORAL_CAPSULE | Freq: Two times a day (BID) | ORAL | Status: DC
  Administered 2013-03-14 – 2013-03-16 (×5): 125 mg via ORAL
  Filled 2013-03-14 (×6): qty 1

## 2013-03-14 MED ORDER — REGADENOSON 0.4 MG/5ML IV SOLN
0.4000 mg | Freq: Once | INTRAVENOUS | Status: AC
  Administered 2013-03-14: 0.4 mg via INTRAVENOUS

## 2013-03-14 NOTE — Progress Notes (Addendum)
Hypoglycemic Event  CBG: 59  Treatment: 15 GM carbohydrate snack  Symptoms: dizzy  Follow-up CBG: Time: 1130  Result: 90  Possible Reasons for Event: NPH was given when Pt NPO  Comments: 16)Z of  OJ given . MD aware; will continue to monitor Pt    Dana [name], [place] Stage  Remember to initiate Hypoglycemia Order Set & complete

## 2013-03-14 NOTE — Progress Notes (Signed)
Pt anxious disoriented and agitated  Ativan 2 mg ivp given as ordered.observed closely.

## 2013-03-14 NOTE — Consult Note (Signed)
Reason for Consult: Agitation Referring Physician: Elease Etienne, MD  Dana [name] is an 67 y.o. female.  HPI: Patient has been suffering with dementia and behavioral problems including confusion, irritability and agitation. Reportedly small dose of Zyprexa helped her about two nights and she had increased agitation and aggression last night and this morning. Patient has been askking to go home but unable to provide details about her address and how to get home etc. Patient was not able to follow instructions or directions and has been refusing to cooperate with staff members. She has LBBB and increased QTc prolongation, so needs to be monitoring with frequent EKG while titrating the medication and needs collaboration with Cardiology if needed. She has history anxiety and depression. Patient husband does not feel comfortable managing her at home at this time. Patient is a poor historian and poorly cooperative with the evaluation.   MSE: Patient is lying down in her bed, and poorly cooperative. Patient has a poor orientation, concentration, immediate and delayed memory and unable to cooperate with the calculation, proverb interpretation and has a limited fund of knowledge. Patient has poor insight judgment impulse control.  Past Medical History  Diagnosis Date  . ALLERGIC RHINITIS 02/08/2007  . ANXIETY 02/08/2007  . Cough 08/15/2008  . DEPRESSION 02/08/2007  . DIABETES MELLITUS, TYPE I 02/08/2007  . Headache(784.0) 11/06/2008  . HYPERCHOLESTEROLEMIA 11/05/2008  . HYPERTENSION 02/08/2007  . URINARY INCONTINENCE 02/08/2007  . Dyslipidemia   . Non-ulcer dyspepsia   . Leukopenia   . DM retinopathy     background  . 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 Dementia     Past Surgical History  Procedure Laterality Date  . Total knee arthroplasty  1990    left  . Panendoscopy  10/27/1999  . Stress cardiolite  12/14/2003  . Electrocardiogram  05/17/2006  . Abdominal hysterectomy       Family History  Problem Relation Age of Onset  . Alcohol abuse Son   . Cancer Neg Hx     no cancer in immediate family  . CAD Father     Social History:  reports that she has never smoked. She has never used smokeless tobacco. She reports that she does not drink alcohol or use illicit drugs.  Allergies:  Allergies  Allergen Reactions  . Aspirin Nausea Only  . Codeine Nausea Only    Medications: I have reviewed the patient's current medications.  Results for orders placed during the hospital encounter of 03/08/13 (from the past 48 hour(s))  GLUCOSE, CAPILLARY     Status: Abnormal   Collection Time    03/12/13  3:58 PM      Result Value Range   Glucose-Capillary 154 (*) 70 - 99 mg/dL  GLUCOSE, CAPILLARY     Status: Abnormal   Collection Time    03/12/13  9:22 PM      Result Value Range   Glucose-Capillary 242 (*) 70 - 99 mg/dL   Comment 1 Notify RN    GLUCOSE, CAPILLARY     Status: Abnormal   Collection Time    03/13/13  6:06 AM      Result Value Range   Glucose-Capillary 162 (*) 70 - 99 mg/dL  GLUCOSE, CAPILLARY     Status: Abnormal   Collection Time    03/13/13 11:42 AM      Result Value Range   Glucose-Capillary 181 (*) 70 - 99 mg/dL  GLUCOSE, CAPILLARY  Status: Abnormal   Collection Time    03/13/13  4:23 PM      Result Value Range   Glucose-Capillary 121 (*) 70 - 99 mg/dL   Comment 1 Notify RN    GLUCOSE, CAPILLARY     Status: Abnormal   Collection Time    03/13/13  9:01 PM      Result Value Range   Glucose-Capillary 234 (*) 70 - 99 mg/dL   Comment 1 Notify RN    BASIC METABOLIC PANEL     Status: Abnormal   Collection Time    03/14/13  5:15 AM      Result Value Range   Sodium 141  135 - 145 mEq/L   Potassium 3.7  3.5 - 5.1 mEq/L   Chloride 103  96 - 112 mEq/L   CO2 32  19 - 32 mEq/L   Glucose, Bld 100 (*) 70 - 99 mg/dL   BUN 24 (*) 6 - 23 mg/dL   Creatinine, Ser 5.78  0.50 - 1.10 mg/dL   Calcium 8.7  8.4 - 46.9 mg/dL   GFR calc non Af Amer  84 (*) >90 mL/min   GFR calc Af Amer >90  >90 mL/min   Comment: (NOTE)     The eGFR has been calculated using the CKD EPI equation.     This calculation has not been validated in all clinical situations.     eGFR's persistently <90 mL/min signify possible Chronic Kidney     Disease.  GLUCOSE, CAPILLARY     Status: None   Collection Time    03/14/13  6:40 AM      Result Value Range   Glucose-Capillary 89  70 - 99 mg/dL  GLUCOSE, CAPILLARY     Status: None   Collection Time    03/14/13  7:40 AM      Result Value Range   Glucose-Capillary 82  70 - 99 mg/dL  GLUCOSE, CAPILLARY     Status: Abnormal   Collection Time    03/14/13 11:02 AM      Result Value Range   Glucose-Capillary 59 (*) 70 - 99 mg/dL   Comment 1 Notify RN    GLUCOSE, CAPILLARY     Status: None   Collection Time    03/14/13 11:29 AM      Result Value Range   Glucose-Capillary 90  70 - 99 mg/dL  GLUCOSE, CAPILLARY     Status: Abnormal   Collection Time    03/14/13  2:06 PM      Result Value Range   Glucose-Capillary 133 (*) 70 - 99 mg/dL    Nm Myocar Multi W/spect W/wall Motion / Ef  03/14/2013   Lexiscan Myovue: Indication: Chest Pain  The patient received .4 mg of Lexiscan as a bolus.  There was mild nausea and GI upset.  Baseline ECG showed NSR with LBBB.  HR stable at 75 bpm and BP not recored by tech.  RAW: motion artifact QPS: 7 abnormal in anterior apex EF:  33% with diffuse hypokinesis and anteroapical akinesis  SPECT:  Moderate anteroapical wall infarct with moderate perinfarct ischemia  Impression:  Intermediate risk myovue with moderate anteroapical wall MI and moderate peri infarct ischemia. EF severely reduced at 33% and looks worse on surfact images  Charlton Haws MD [facility]   Original Report Authenticated By: Charlton Haws, M.D.    Positive for aggressive behavior, anxiety, bad mood, behavior problems and Dementia Blood pressure 127/77, pulse 80, temperature 97.4 F (36.3  C), temperature source Oral, resp.  rate 18, height 5\' 3"  (1.6 m), weight 70.489 kg (155 lb 6.4 oz), SpO2 97.00%.   Assessment/Plan: Dementia with behavioral problems  Recommended: Case discussed with Elease Etienne, MD Patient does not meet criteria for capaucity to make her own medical decisions or living arrangements Start Depakote sprinkles 125 mg PO BID for irritability and hostility and monitor for VPA level and CMP four days from now to manage within therapeutic window and not a toxic levels. Increase Zyprexa 5 mg PO Qhs for agitation and aggressive behaviors Patient does not meet criteria for acute psychiatric hospitalization Appreciate Psychiatric consultation and followup as needed  Dana [name]., M.D. 03/14/2013, 3:28 PM

## 2013-03-14 NOTE — Evaluation (Signed)
Physical Therapy Evaluation Patient Details Name: Dana [name] MRN: [id number] DOB: 01-10-1946 Today's Date: 03/14/2013 Time: 4098-1191 PT Time Calculation (min): 15 min  PT Assessment / Plan / Recommendation History of Present Illness  67 y.o. female with hx of HTN, DM, hyperlipidemia, anxiety and depression, but no known CAD or CHF, presents to the ER with about 2 to 3 months of bilateral pedal edema, increase DOE, but no orthopnea or PND.  She has intermittent chest tightness.  There has been no fever or chills.  Evaluation in the ER included a BNP of 1008. CXR with vascular congestion and bilateral effusion.  She has normal renal fx tests and negative troponin.  Her EKG showed new complete LBBB, where as in march 2014, she has intraventicular conduction delay.  Hospitalist was asked to admit her for new EKG changes of LBBB, and for new onset of CHF.  Clinical Impression  Pt with episodes of agitation involving calling security this date as well as a fall in the bathroom this date per RN report. Due to patients impaired cognition and increased falls risk pt currently unsafe to discharge home at this time. Pt to benefit from SNF with memory unit for approriate 24/7 assist as well as therapy to address generalized deconditioning and balance impairments.     PT Assessment  Patient needs continued PT services    Follow Up Recommendations  SNF;Supervision/Assistance - 24 hour    Does the patient have the potential to tolerate intense rehabilitation      Barriers to Discharge Decreased caregiver support spouse unable to care for patient    Equipment Recommendations  Rolling walker with 5" wheels    Recommendations for Other Services     Frequency Min 2X/week    Precautions / Restrictions Precautions Precautions: Fall (dementia) Precaution Comments: pt with fall today in bathroom with RN staff Restrictions Weight Bearing Restrictions: No   Pertinent Vitals/Pain Pt did not report  pain      Mobility  Bed Mobility Bed Mobility: Sitting - Scoot to Edge of Bed Sitting - Scoot to Edge of Bed: 4: Min assist Details for Bed Mobility Assistance: max directional v/c's to keep pt on task. tactile cues to direct pt mvmt to complete transfer Transfers Transfers: Sit to Stand;Stand to Sit Sit to Stand: 4: Min assist;With upper extremity assist;From bed Stand to Sit: 4: Min assist;With upper extremity assist;To bed Details for Transfer Assistance: max verbal and tactile cues to complete task. pt with limited attn and easily distracted Ambulation/Gait Ambulation/Gait Assistance: 4: Min assist Ambulation Distance (Feet): 100 Feet Assistive device: Rolling walker Ambulation/Gait Assistance Details: mina for walker management. pt kept leaving walker but was unable to amb safely without RW.  Gait Pattern: Step-through pattern;Decreased stride length Gait velocity: slow Stairs: No    Exercises     PT Diagnosis: Difficulty walking  PT Problem List: Decreased activity tolerance;Decreased knowledge of use of DME;Decreased cognition;Decreased safety awareness;Decreased balance PT Treatment Interventions: DME instruction;Gait training;Functional mobility training;Therapeutic activities;Therapeutic exercise     PT Goals(Current goals can be found in the care plan section) Acute Rehab PT Goals Patient Stated Goal: did not state PT Goal Formulation: Patient unable to participate in goal setting Time For Goal Achievement: 03/28/13 Potential to Achieve Goals: Fair  Visit Information  Last PT Received On: 03/14/13 Assistance Needed: +1 History of Present Illness: 67 y.o. female with hx of HTN, DM, hyperlipidemia, anxiety and depression, but no known CAD or CHF, presents to the ER with about 2  to 3 months of bilateral pedal edema, increase DOE, but no orthopnea or PND.  She has intermittent chest tightness.  There has been no fever or chills.  Evaluation in the ER included a BNP of  1008. CXR with vascular congestion and bilateral effusion.  She has normal renal fx tests and negative troponin.  Her EKG showed new complete LBBB, where as in march 2014, she has intraventicular conduction delay.  Hospitalist was asked to admit her for new EKG changes of LBBB, and for new onset of CHF.       Prior Functioning  Home Living Family/patient expects to be discharged to:: Unsure Living Arrangements: Spouse/significant other Additional Comments: pt unable to provided history due to severe dementia. per cardiology note spouse concerned about taking her home due to her severe dementia and h/o of agitation/combativeness Prior Function Level of Independence:  (unsure) Comments: pt poor historian usure of PLOF Communication Communication: No difficulties Dominant Hand: Right    Cognition  Cognition Arousal/Alertness: Awake/alert Behavior During Therapy: WFL for tasks assessed/performed Overall Cognitive Status: History of cognitive impairments - at baseline Memory: Decreased short-term memory    Extremity/Trunk Assessment Upper Extremity Assessment Upper Extremity Assessment: Overall WFL for tasks assessed Lower Extremity Assessment Lower Extremity Assessment: Overall WFL for tasks assessed Cervical / Trunk Assessment Cervical / Trunk Assessment: Normal   Balance    End of Session PT - End of Session Equipment Utilized During Treatment: Gait belt Activity Tolerance: Patient tolerated treatment well Patient left: in bed;with call bell/phone within reach;with bed alarm set;with nursing/sitter in room Nurse Communication: Mobility status  GP     Marcene Brawn 03/14/2013, 4:52 PM  Lewis Shock, PT, DPT Pager #: [phone] Office #: [phone]

## 2013-03-14 NOTE — Progress Notes (Signed)
Pt has become agitated, security called. Pt was trying to Academic librarian.Pt stating she was going home and cook.  Security help Rn give ativan.

## 2013-03-14 NOTE — Progress Notes (Signed)
    SUBJECTIVE: No complaints.   BP 139/80  Pulse 80  Temp(Src) 97.2 F (36.2 C) (Oral)  Resp 19  Ht 5\' 3"  (1.6 m)  Wt 155 lb 6.4 oz (70.489 kg)  BMI 27.53 kg/m2  SpO2 99%  Intake/Output Summary (Last 24 hours) at 03/14/13 1123 Last data filed at 03/14/13 0800  Gross per 24 hour  Intake    720 ml  Output   1400 ml  Net   -680 ml    PHYSICAL EXAM General: Well developed, well nourished, in no acute distress. Alert and oriented x 3.  Psych:  Good affect, responds appropriately Neck: No JVD. No masses noted.  Lungs: Clear bilaterally with no wheezes or rhonci noted.  Heart: RRR with no murmurs noted. Abdomen: Bowel sounds are present. Soft, non-tender.  Extremities: 1+ bilateral lower extremity edema.   LABS: Basic Metabolic Panel:  Recent Labs  04/54/09 0515  NA 141  K 3.7  CL 103  CO2 32  GLUCOSE 100*  BUN 24*  CREATININE 0.77  CALCIUM 8.7   Current Meds: . aspirin EC  325 mg Oral Daily  . carvedilol  3.125 mg Oral BID WC  . citalopram  20 mg Oral Daily  . furosemide  40 mg Oral BID  . heparin  5,000 Units Subcutaneous Q8H  . insulin aspart  0-5 Units Subcutaneous QHS  . insulin aspart  0-9 Units Subcutaneous TID WC  . insulin aspart  3 Units Subcutaneous TID WC  . insulin NPH  14 Units Subcutaneous QAC breakfast  . insulin NPH  5 Units Subcutaneous QHS  . lisinopril  2.5 mg Oral Daily  . OLANZapine  2.5 mg Oral QHS  . sodium chloride  3 mL Intravenous Q12H     ASSESSMENT AND PLAN:  1. Acute systolic CHF: Pt admitted with volume overload c/w CHF. Echo 03/09/13 with LVEF 25-30%. No significant valve disease. No prior known cardiac disease. ? Etiology of cardiomyopathy. She has diuresed well and now appears to be euvolemic. Given risk factors for CAD with new cardiomyopathy, we had planned right and left heart cath to better define her pressures and exclude CAD but her dementia and agitation has limited ability to safely perform cardiac cath. She has been  very agitated and combative at times with nursing staff as documented in the chart. Continue medical management with Coreg, Lisinopril. Continue Lasix 40 mg po BID. Plans for stress myoview today to exclude ischemia since she is not felt to be able to tolerate a cardiac cath secondary to agitation. If stress test does not show large area of ischemia, she would be ready for d/c home from our standpoint.   2. LBBB     MCALHANY,CHRISTOPHER  8/26/201411:23 AM

## 2013-03-14 NOTE — Progress Notes (Signed)
[facility] PROGRESS NOTE  Dana [name] FIE:[id number] DOB: 20-Jul-1946 DOA: 03/08/2013 PCP: Romero Belling, MD  Brief narrative 67 y.o. female with hx of HTN, DM, hyperlipidemia, anxiety and depression, but no known CAD or CHF presented to ED with approximately three-week history of worsening bilateral leg edema, dyspnea on exertion, but no chest pain, orthopnea or PND. Family history positive for MI in father at age 94. Evaluation in the ER included a BNP of 1008. CXR with vascular congestion and bilateral effusion. She has normal renal fx tests and negative troponin. Her EKG showed new complete LBBB, where as in march 2014, she has intraventicular conduction delay. Hospitalist was asked to admit her for new EKG changes of LBBB, and for new onset of CHF. She was diuresed and has clinically improved. Her echo showed new cardiomyopathy. However due to her dementia with associated agitation, it was not felt safe to do Cath and hence stress test was done on 8/26. Psychiatry consulted for agitation issues. Spouse would like patient to go to SNF when stable, at least for short term.   Assessment/Plan:  CHF exacerbation (acute systolic)/new cardiomyopathy/NSVT - Patient has no prior history of heart disease or CHF. - Improved after IV Lasix which was changed to PO Lasix on 8/23. - Cardiology following - 2-D echo: LVEF: 25-30%, - Started low dose Coreg 3.125 and lisinopril. - Patient had 8 beat NSVT on 8/22 AM. - Improved. I/O's probably not accurate. Weight 74.4 kg admission >68.4 today. - Await cardiology recommendations pending stress test results-abnormal as below.   Hypokalemia - Repleted. Magnesium 1.8.  New LBBB - Troponin negative. - 2-D echo-results as above. - No chest pain. Cardiology followup as above. - Continue aspirin  Hypertension - Reasonable inpatient control. Soft BP- not orthostatic. Change Lasix to PO and monitor  Type II DM - Uncontrolled. A1c was 15.8 in  September 2013.  - Continue NPH and sliding scale insulin.  - A1C 10.4. - Added meal time Novolog & low dose NPH at bedtime. Better - Patient had a hypoglycemic episode this morning-was n.p.o. for stress test. Monitor closely.  Hyperlipidemia - Not on medications.  History of depression - Continue home medications- Celexa  Dementia with Agitation/behavioral problems - On 8/20 afternoon she started getting progressively confused, agitated and attempting to get out  of bed. - Discussed with spouse who gives history of similar behavior at home, worse during the evening/night. - Treat with Ativan when necessary for agitation and placed safety sitter. - RPR & TSH unremarkable in past - Psychiatry consulted at family's request. - Need to avoid medications that would precipitate prolonged QTC-patient had prolonged QTC on  admission and has  cardiomyopathy. - Patient lacks capacity to make medical decisions. - Patient again had agitation overnight. Psychiatry consulted again and have increased the dose of Zyprexa and added  Depakote. Her CBC, LFTs & Depakote levels will need to be closely monitored. - Patient sustained a fall this afternoon and hit her head. No obvious injuries. No focal deficits. Monitor closely  Code Status: Full Family Communication: Discussed with spouse. Disposition Plan: SNF when medically stable.   Consultants:  [facility] cardiology  Psychiatry  Procedures:  None  Antibiotics:  None   HPI/Subjective: Denies complaints. Confused. Attempts to try to get out of bed.  Objective: Filed Vitals:   03/14/13 1342 03/14/13 1426 03/14/13 1500 03/14/13 1602  BP: 99/60 118/76 127/77 115/60  Pulse: 87 76 80 78  Temp: 97.4 F (36.3 C)  TempSrc: Oral     Resp: 20 18    Height:      Weight:      SpO2: 98% 100% 97%     Intake/Output Summary (Last 24 hours) at 03/14/13 1747 Last data filed at 03/14/13 1322  Gross per 24 hour  Intake    960 ml  Output    1402 ml  Net   -442 ml   Filed Weights   03/12/13 0532 03/13/13 0547 03/14/13 0639  Weight: 68.6 kg (151 lb 3.8 oz) 68.4 kg (150 lb 12.7 oz) 70.489 kg (155 lb 6.4 oz)    Exam:   General exam: Moderately built and nourished female comfortably lying in bed.  HEENT: Negative exam.   Respiratory system: clear to auscultation. No increased work of breathing.  Cardiovascular system: S1 & S2 heard, RRR. No JVD, murmurs, gallops, clicks. 1+ pitting bilateral leg edema to knees  Gastrointestinal system: Abdomen is nondistended, soft and nontender. Normal bowel sounds heard.  Central nervous system: Alert and oriented to person & place. No focal neurological deficits.  Extremities: Symmetric 5 x 5 power.   Data Reviewed: Basic Metabolic Panel:  Recent Labs Lab 03/07/13 2132 03/08/13 0624 03/09/13 0625 03/10/13 0455 03/11/13 0437 03/14/13 0515  NA 140  --  140 138 138 141  K 4.2  --  4.2 3.4* 3.8 3.7  CL 102  --  99 98 99 103  CO2 30  --  30 33* 33* 32  GLUCOSE 376*  --  220* 209* 156* 100*  BUN 23  --  20 21 19  24*  CREATININE 0.72 0.58 0.62 0.58 0.60 0.77  CALCIUM 8.9  --  9.5 8.5 8.7 8.7  MG  --   --   --  1.8  --   --    Liver Function Tests: No results found for this basename: AST, ALT, ALKPHOS, BILITOT, PROT, ALBUMIN,  in the last 168 hours No results found for this basename: LIPASE, AMYLASE,  in the last 168 hours No results found for this basename: AMMONIA,  in the last 168 hours CBC:  Recent Labs Lab 03/07/13 2132 03/08/13 0624  WBC 3.9* 4.6  HGB 13.5 14.1  HCT 40.0 41.0  MCV 84.4 83.5  PLT 325 320   Cardiac Enzymes:  Recent Labs Lab 03/08/13 0336 03/08/13 1100 03/08/13 1709  TROPONINI <0.30 <0.30 <0.30   BNP (last 3 results)  Recent Labs  03/07/13 2132  PROBNP 1008.0*   CBG:  Recent Labs Lab 03/14/13 0740 03/14/13 1102 03/14/13 1129 03/14/13 1406 03/14/13 1555  GLUCAP 82 59* 90 133* 116*    No results found for this or any  previous visit (from the past 240 hour(s)).   Studies: Nm Myocar Multi W/spect W/wall Motion / Ef  03/14/2013   Lexiscan Myovue: Indication: Chest Pain  The patient received .4 mg of Lexiscan as a bolus.  There was mild nausea and GI upset.  Baseline ECG showed NSR with LBBB.  HR stable at 75 bpm and BP not recored by tech.  RAW: motion artifact QPS: 7 abnormal in anterior apex EF:  33% with diffuse hypokinesis and anteroapical akinesis  SPECT:  Moderate anteroapical wall infarct with moderate perinfarct ischemia  Impression:  Intermediate risk myovue with moderate anteroapical wall MI and moderate peri infarct ischemia. EF severely reduced at 33% and looks worse on surfact images  Charlton Haws MD [facility]   Original Report Authenticated By: Charlton Haws, M.D.     Additional labs:  Scheduled Meds: . aspirin EC  325 mg Oral Daily  . carvedilol  3.125 mg Oral BID WC  . citalopram  20 mg Oral Daily  . divalproex  125 mg Oral Q12H  . furosemide  40 mg Oral BID  . heparin  5,000 Units Subcutaneous Q8H  . insulin aspart  0-5 Units Subcutaneous QHS  . insulin aspart  0-9 Units Subcutaneous TID WC  . insulin aspart  3 Units Subcutaneous TID WC  . insulin NPH  14 Units Subcutaneous QAC breakfast  . insulin NPH  5 Units Subcutaneous QHS  . lisinopril  2.5 mg Oral Daily  . OLANZapine  2.5 mg Oral QHS  . sodium chloride  3 mL Intravenous Q12H   Continuous Infusions:   Principal Problem:   Acute systolic CHF (congestive heart failure) Active Problems:   DEPRESSION   Edema   Chest pain   Hypokalemia   Diabetes mellitus type 2, uncontrolled   Noncompliance   Alzheimer's disease   Abnormal EKG   LBBB (left bundle branch block)   Congestive dilated cardiomyopathy    Time spent: 25 minutes    [facility]  [facility] Pager [phone].   If 8PM-8AM, please contact night-coverage at [email or web address], password [facility] 03/14/2013, 5:47 PM  LOS: 6 days

## 2013-03-14 NOTE — Progress Notes (Addendum)
.   Pt fall in bathroom. Sitter request Rn come to room to help Pt.  Sitter was covering  Rooms 09 n 10. Pt at preadmit baseline Rn walked Pt to bathroom , Pt refuse BSC. Rn did not want Pt to become agitated, which Pt has done during admit.  Rn stayed outside out BR. Rn check on Pt she was standing up in BR stated that it toilet would not flush. Rn walked in BR to flush. Pt began walking backwards tripped on shower lege. Pt hit head and Rt elbow when during fall. Post fall, PT VS stable, ROM done, Neuro check done Pt only sym is headache. CBG done 133. Post fall huddle done. Pt put on Freq vs and neuro check per procl. Pt husband called    MD aware 1350, [place]

## 2013-03-15 LAB — GLUCOSE, CAPILLARY
Glucose-Capillary: 159 mg/dL — ABNORMAL HIGH (ref 70–99)
Glucose-Capillary: 67 mg/dL — ABNORMAL LOW (ref 70–99)
Glucose-Capillary: 104 mg/dL — ABNORMAL HIGH (ref 70–99)

## 2013-03-15 MED ORDER — LORAZEPAM 2 MG/ML IJ SOLN
1.0000 mg | Freq: Four times a day (QID) | INTRAMUSCULAR | Status: DC | PRN
  Administered 2013-03-15 (×2): 2 mg via INTRAMUSCULAR
  Filled 2013-03-15: qty 1

## 2013-03-15 MED ORDER — LORAZEPAM 2 MG/ML IJ SOLN: 1.0000 mg | Freq: Four times a day (QID) | INTRAMUSCULAR | Status: DC | PRN

## 2013-03-15 NOTE — Progress Notes (Addendum)
[facility] PROGRESS NOTE  SANDEEP DELAGARZA WJX:[id number] DOB: [birth year] DOA: 03/08/2013 PCP: Romero Belling, MD  Brief narrative 67 y.o. female with hx of HTN, DM, hyperlipidemia, anxiety and depression, but no known CAD or CHF presented to ED with approximately three-week history of worsening bilateral leg edema, dyspnea on exertion, but no chest pain, orthopnea or PND. Family history positive for MI in father at age 73. Evaluation in the ER included a BNP of 1008. CXR with vascular congestion and bilateral effusion. She has normal renal fx tests and negative troponin. Her EKG showed new complete LBBB, where as in march 2014, she has intraventicular conduction delay. Hospitalist was asked to admit her for new EKG changes of LBBB, and for new onset of CHF. She was diuresed and has clinically improved. Her echo showed new cardiomyopathy. However due to her dementia with associated agitation, it was not felt safe to do Cath and hence stress test was done on 8/26. Psychiatry consulted for agitation issues. Spouse would like patient to go to SNF when stable, at least for short term. -tried to contact family without success; no answer,mailbox is full; will ask SW assistance     Assessment/Plan:  CHF exacerbation (acute systolic)/new cardiomyopathy/NSVT - Patient has no prior history of heart disease or CHF. - Improved after IV Lasix which was changed to PO Lasix on 8/23. - Cardiology following - 2-D echo: LVEF: 25-30%, - Started low dose Coreg 3.125 and lisinopril. - Patient had 8 beat NSVT on 8/22 AM. - Improved. I/O's probably not accurate. Weight 74.4 kg admission >68.4 today. - per cardiology no aggressive intervention due to dementia   -monitor BP, will hold diuretics if low BP Hypokalemia - Repleted. Magnesium 1.8.  New LBBB - Troponin negative. - 2-D echo-results as above. - No chest pain. Cardiology followup as above. - Continue aspirin  Hypertension - Reasonable inpatient  control. Soft BP- not orthostatic. Change Lasix to PO and monitor  Type II DM - Uncontrolled. A1c was 15.8 in September 2013.  - Continue NPH and sliding scale insulin.  - A1C 10.4. - Added meal time Novolog & low dose NPH at bedtime. Better - Patient had a hypoglycemic episode this morning-was n.p.o. for stress test. Monitor closely. -d/c mealtime insuin, monitor   Hyperlipidemia - Not on medications.  History of depression - Continue home medications- Celexa  Dementia with Agitation/behavioral problems - On 8/20 afternoon she started getting progressively confused, agitated and attempting to get out  of bed. - Discussed with spouse who gives history of similar behavior at home, worse during the evening/night. - Treat with Ativan when necessary for agitation and placed safety sitter. - RPR & TSH unremarkable in past - Psychiatry consulted at family's request. - Need to avoid medications that would precipitate prolonged QTC-patient had prolonged QTC on  admission and has  cardiomyopathy. - Patient lacks capacity to make medical decisions. - Patient again had agitation overnight. Psychiatry consulted again and have increased the dose of Zyprexa and added  Depakote. Her CBC, LFTs & Depakote levels will need to be closely monitored. - Patient sustained a fall this afternoon and hit her head. No obvious injuries. No focal deficits. Monitor closely  Code Status: Full Family Communication: Discussed with spouse. Disposition Plan: SNF when medically stable. Consulted  case management   Consultants:  [place]  Psychiatry  Procedures:  None  Antibiotics:  None   HPI/Subjective: Denies complaints. Confused. Attempts to try to get out of bed.  Objective: Filed Vitals:  03/15/13 0915 03/15/13 0920 03/15/13 0925 03/15/13 0927  BP: 97/53 97/49 91/54  102/58  Pulse: 79 81 79   Temp:      TempSrc:      Resp:      Height:      Weight:      SpO2:         Intake/Output Summary (Last 24 hours) at 03/15/13 1126 Last data filed at 03/15/13 0600  Gross per 24 hour  Intake    840 ml  Output    352 ml  Net    488 ml   Filed Weights   03/13/13 0547 03/14/13 0639 03/15/13 0434  Weight: 68.4 kg (150 lb 12.7 oz) 70.489 kg (155 lb 6.4 oz) 69.6 kg (153 lb 7 oz)    Exam:   General exam: Moderately built and nourished female comfortably lying in bed.  HEENT: Negative exam.   Respiratory system: clear to auscultation. No increased work of breathing.  Cardiovascular system: S1 & S2 heard, RRR. No JVD, murmurs, gallops, clicks. 1+ pitting bilateral leg edema to knees  Gastrointestinal system: Abdomen is nondistended, soft and nontender. Normal bowel sounds heard.  Central nervous system: Alert and oriented to person & place. No focal neurological deficits.  Extremities: Symmetric 5 x 5 power.   Data Reviewed: Basic Metabolic Panel:  Recent Labs Lab 03/09/13 0625 03/10/13 0455 03/11/13 0437 03/14/13 0515  NA 140 138 138 141  K 4.2 3.4* 3.8 3.7  CL 99 98 99 103  CO2 30 33* 33* 32  GLUCOSE 220* 209* 156* 100*  BUN 20 21 19  24*  CREATININE 0.62 0.58 0.60 0.77  CALCIUM 9.5 8.5 8.7 8.7  MG  --  1.8  --   --    Liver Function Tests: No results found for this basename: AST, ALT, ALKPHOS, BILITOT, PROT, ALBUMIN,  in the last 168 hours No results found for this basename: LIPASE, AMYLASE,  in the last 168 hours No results found for this basename: AMMONIA,  in the last 168 hours CBC: No results found for this basename: WBC, NEUTROABS, HGB, HCT, MCV, PLT,  in the last 168 hours Cardiac Enzymes:  Recent Labs Lab 03/08/13 1709  TROPONINI <0.30   BNP (last 3 results)  Recent Labs  03/07/13 2132  PROBNP 1008.0*   CBG:  Recent Labs Lab 03/14/13 1129 03/14/13 1406 03/14/13 1555 03/14/13 2153 03/15/13 0553  GLUCAP 90 133* 116* 300* 159*    No results found for this or any previous visit (from the past 240 hour(s)).    Studies: Nm Myocar Multi W/spect W/wall Motion / Ef  03/14/2013   Lexiscan Myovue: Indication: Chest Pain  The patient received .4 mg of Lexiscan as a bolus.  There was mild nausea and GI upset.  Baseline ECG showed NSR with LBBB.  HR stable at 75 bpm and BP not recored by tech.  RAW: motion artifact QPS: 7 abnormal in anterior apex EF:  33% with diffuse hypokinesis and anteroapical akinesis  SPECT:  Moderate anteroapical wall infarct with moderate perinfarct ischemia  Impression:  Intermediate risk myovue with moderate anteroapical wall MI and moderate peri infarct ischemia. EF severely reduced at 33% and looks worse on surfact images  Charlton Haws MD [facility]   Original Report Authenticated By: Charlton Haws, M.D.     Additional labs:   Scheduled Meds: . aspirin EC  325 mg Oral Daily  . carvedilol  3.125 mg Oral BID WC  . citalopram  20 mg Oral Daily  .  divalproex  125 mg Oral Q12H  . furosemide  40 mg Oral BID  . heparin  5,000 Units Subcutaneous Q8H  . insulin aspart  0-5 Units Subcutaneous QHS  . insulin aspart  0-9 Units Subcutaneous TID WC  . insulin aspart  3 Units Subcutaneous TID WC  . insulin NPH  14 Units Subcutaneous QAC breakfast  . insulin NPH  5 Units Subcutaneous QHS  . lisinopril  2.5 mg Oral Daily  . OLANZapine  2.5 mg Oral QHS  . sodium chloride  3 mL Intravenous Q12H   Continuous Infusions:   Principal Problem:   Acute systolic CHF (congestive heart failure) Active Problems:   DEPRESSION   Edema   Chest pain   Hypokalemia   Diabetes mellitus type 2, uncontrolled   Noncompliance   Alzheimer's disease   Abnormal EKG   LBBB (left bundle branch block)   Congestive dilated cardiomyopathy    Time spent: 25 minutes    Esperanza Sheets  [facility] Pager [phone].   If 8PM-8AM, please contact night-coverage at [email or web address], password [facility] 03/15/2013, 11:26 AM  LOS: 7 days

## 2013-03-15 NOTE — Clinical Social Work Psych Note (Signed)
Psych CSW reviewed chart and psychiatry documentation dated 03/14/2013.  Psychiatry documented  "Patient does not meet criteria for capaucity to make her own medical decisions or living arrangements"  Psych CSW made unit CSW aware.    Vickii Penna, LCSWA [phone]  Clinical Social Work

## 2013-03-15 NOTE — Progress Notes (Signed)
    SUBJECTIVE: no complaints.   BP 103/61  Pulse 72  Temp(Src) 98.3 F (36.8 C) (Oral)  Resp 18  Ht 5\' 3"  (1.6 m)  Wt 153 lb 7 oz (69.6 kg)  BMI 27.19 kg/m2  SpO2 94%  Intake/Output Summary (Last 24 hours) at 03/15/13 0654 Last data filed at 03/15/13 0600  Gross per 24 hour  Intake   1080 ml  Output    902 ml  Net    178 ml    PHYSICAL EXAM General: Well developed, well nourished, in no acute distress. Alert  Psych:  Good affect, responds appropriately Neck: No JVD. No masses noted.  Lungs: Clear bilaterally with no wheezes or rhonci noted.  Heart: RRR with no murmurs noted. Abdomen: Bowel sounds are present. Soft, non-tender.  Extremities: 1+ bilateral lower extremity edema.   LABS: Basic Metabolic Panel:  Recent Labs  11/91/47 0515  NA 141  K 3.7  CL 103  CO2 32  GLUCOSE 100*  BUN 24*  CREATININE 0.77  CALCIUM 8.7   Current Meds: . aspirin EC  325 mg Oral Daily  . carvedilol  3.125 mg Oral BID WC  . citalopram  20 mg Oral Daily  . divalproex  125 mg Oral Q12H  . furosemide  40 mg Oral BID  . heparin  5,000 Units Subcutaneous Q8H  . insulin aspart  0-5 Units Subcutaneous QHS  . insulin aspart  0-9 Units Subcutaneous TID WC  . insulin aspart  3 Units Subcutaneous TID WC  . insulin NPH  14 Units Subcutaneous QAC breakfast  . insulin NPH  5 Units Subcutaneous QHS  . lisinopril  2.5 mg Oral Daily  . OLANZapine  2.5 mg Oral QHS  . sodium chloride  3 mL Intravenous Q12H   Stress myoview 03/14/13: Intermediate risk myovue with moderate anteroapical wall MI and  moderate peri infarct ischemia. EF severely reduced at 33% and  looks worse on surfact images   ASSESSMENT AND PLAN:  1. Cardiomyopathy: Given risk factors for CAD with new cardiomyopathy, we had planned right and left heart cath to better define her pressures and exclude CAD but her dementia and agitation has limited ability to safely perform cardiac cath. She has been very agitated and  combative at times with nursing staff as documented in the chart. We elected for stress myoview which was performed on 03/14/13 and showed moderate scar in the anterolateral wall with peri-infarct ischemia. She is currently asymptomatic. While she likely has coronary artery disease, her dementia and agitation make her a poor candidate for cardiac cath. Would continue medical management with Coreg, Lisinopril.   2. Acute systolic CHF: Pt admitted with volume overload c/w CHF. Echo 03/09/13 with LVEF 25-30%. No significant valve disease. No prior known cardiac disease. She still has lower extremity edema.  Would increase Lasix to 40 mg po TID. She is ready for d/c from cardiac perspective. I would be glad to discuss her cardiac issues with her husband today if he is available. I can be paged at [phone]. She can have f/u arranged in my office in several weeks.    MCALHANY,CHRISTOPHER  8/27/20146:54 AM

## 2013-03-15 NOTE — Progress Notes (Signed)
Report given to receiving RN. Patient stable. No signs or symptoms of distress or discomfort. 

## 2013-03-15 NOTE — Progress Notes (Signed)
Utilization Review Completed.   Haley Roza, RN, BSN Nurse Case Manager  [phone]  

## 2013-03-15 NOTE — Progress Notes (Signed)
Patient is now a little more calmer. Will continue to monitor patient for further changes in condition.

## 2013-03-15 NOTE — Progress Notes (Signed)
Inpatient Diabetes Program Recommendations  AACE/ADA: New Consensus Statement on Inpatient Glycemic Control (2013)  Target Ranges:  Prepandial:   less than 140 mg/dL      Peak postprandial:   less than 180 mg/dL (1-2 hours)      Critically ill patients:  140 - 180 mg/dL  Results for ENVY, MENO (MRN [id number]) as of 03/15/2013 12:22  Ref. Range 03/14/2013 07:40 03/14/2013 11:02 03/14/2013 11:29 03/14/2013 14:06 03/14/2013 15:55 03/14/2013 21:53 03/15/2013 05:53 03/15/2013 12:07  Glucose-Capillary Latest Range: 70-99 mg/dL 82 59 (L) 90 409 (H) 811 (H) 300 (H) 159 (H) 67 (L)   HYPOglycemia at noon yesterday 8/26 and today 8/27  Inpatient Diabetes Program Recommendations Insulin - Meal Coverage: consider discontinuing Novolog with meals  HgbA1C: .  Consider DC Novolog with meals or specifically breakfast dose.  Thank you  Piedad Climes BSN, RN,CDE Inpatient Diabetes Coordinator [phone] (team pager)

## 2013-03-15 NOTE — Progress Notes (Signed)
Patient began to be agitated and fussy. MD made aware. Ne orders given. Will continue to monitor patient for further changes in condition.

## 2013-03-15 NOTE — Progress Notes (Signed)
Patient's CBG was 67 around lunch time. She received her lunch tray promptly afterwards and had 120cc of orange juice. CBG is now 104. MD aware. New orders given.

## 2013-03-16 LAB — GLUCOSE, CAPILLARY

## 2013-03-16 MED ORDER — LISINOPRIL 2.5 MG PO TABS: 2.5000 mg | ORAL_TABLET | Freq: Every day | ORAL | Status: DC

## 2013-03-16 MED ORDER — FUROSEMIDE 40 MG PO TABS: 40.0000 mg | ORAL_TABLET | Freq: Two times a day (BID) | ORAL | Status: DC

## 2013-03-16 MED ORDER — ASPIRIN 325 MG PO TBEC: 325.0000 mg | DELAYED_RELEASE_TABLET | Freq: Every day | ORAL | Status: DC

## 2013-03-16 MED ORDER — DIVALPROEX SODIUM 125 MG PO CPSP: 125.0000 mg | ORAL_CAPSULE | Freq: Two times a day (BID) | ORAL | Status: DC

## 2013-03-16 MED ORDER — OLANZAPINE 2.5 MG PO TABS: 2.5000 mg | ORAL_TABLET | Freq: Every day | ORAL | Status: DC

## 2013-03-16 MED ORDER — CARVEDILOL 3.125 MG PO TABS: 3.1250 mg | ORAL_TABLET | Freq: Two times a day (BID) | ORAL | Status: DC

## 2013-03-16 NOTE — Discharge Summary (Signed)
Physician Discharge Summary  Dana [name] ZOX:[id number] DOB: 1945-09-20 DOA: 03/08/2013  PCP: Romero Belling, MD  Admit date: 03/08/2013 Discharge date: 03/16/2013  Time spent: > 30 minutes  minutes  Recommendations for Outpatient Follow-up:  1. Follow up with Dr. Clifton James on 03/27/13 2. Follow up with PCP in 1 week; need BMP    Discharge Diagnoses:  Principal Problem:   Acute systolic CHF (congestive heart failure) Active Problems:   DEPRESSION   Edema   Chest pain   Hypokalemia   Diabetes mellitus type 2, uncontrolled   Noncompliance   Alzheimer's disease   Abnormal EKG   LBBB (left bundle branch block)   Congestive dilated cardiomyopathy   Discharge Condition: stable   Diet recommendation: heart healthy   Filed Weights   03/14/13 0639 03/15/13 0434 03/16/13 0543  Weight: 70.489 kg (155 lb 6.4 oz) 69.6 kg (153 lb 7 oz) 68.5 kg (151 lb 0.2 oz)    History of present illness:  67 y.o. female with hx of HTN, DM, hyperlipidemia, anxiety and depression, but no known CAD or CHF presented to ED with approximately three-week history of worsening bilateral leg edema, dyspnea on exertion, but no chest pain, orthopnea or PND. Family history positive for MI in father at age 25. Evaluation in the ER included a BNP of 1008. CXR with vascular congestion and bilateral effusion. She has normal renal fx tests and negative troponin. Her EKG showed new complete LBBB, where as in march 2014, she has intraventicular conduction delay. Hospitalist was asked to admit her for new EKG changes of LBBB, and for new onset of CHF. She was diuresed and has clinically improved. Her echo showed new cardiomyopathy. However due to her dementia with associated agitation, it was not felt safe to do Cath and hence stress test was done on 8/26 (ishemia). Psychiatry consulted for agitation issues.    Hospital Course:  Assessment/Plan:  CHF exacerbation (acute systolic)/new cardiomyopathy/NSVT/ishemic cardiomyopathy   - Patient has no prior history of heart disease or CHF.  - Improved after IV Lasix which was changed to PO Lasix on 8/23.  - 2-D echo: LVEF: 25-30%,  - Started low dose Coreg 3.125 and lisinopril.  - Weight 74.4 kg admission >68.4 - per cardiology no aggressive intervention due to dementia  -cardiology f/u in two weeks, scheduled  New LBBB  - Troponin negative. 2-D echo-results as above.  Hypertension  - Reasonable inpatient control.  Type II DM  - Uncontrolled. A1c was 15.8 in September 2013. Repeat  A1C 10.4.  - discontinued meal time Novolog due hypoglycemic episode  -resumed NPH; need PCP follow up in 1 week; to adjust insulin if needed Dementia with Agitation/behavioral problems  - On 8/20 afternoon she started getting progressively confused, agitated and attempting to get out of bed.  - Discussed with spouse who gives history of similar behavior at home, worse during the evening/night.  - RPR & TSH unremarkable in past  - Psychiatry consulted at family's request. Psychiatry recommended Zyprexa and added Depakote.   Procedures: Echo  Consultations:  Cardiology, Psychiatry   Discharge Exam: Filed Vitals:   03/16/13 1027  BP: 143/70  Pulse: 78  Temp:   Resp:     General: alert, no distress  Cardiovascular: S1, S2, RRR Respiratory: CTA BL   Discharge Instructions  Discharge Orders   Future Appointments Provider Department Dept Phone   03/27/2013 11:45 AM Kathleene Hazel, MD [facility] Main Office [place]) [phone]   Future Orders Complete By Expires  Diet - low sodium heart healthy  As directed    Discharge instructions  As directed    Comments:     Follow up with Dr. Zola Button on March 27, 2013   Increase activity slowly  As directed        Medication List         aspirin 325 MG EC tablet  Take 1 tablet (325 mg total) by mouth daily.     carvedilol 3.125 MG tablet  Commonly known as:  COREG  Take 1 tablet (3.125 mg total) by mouth  2 (two) times daily with a meal.     citalopram 20 MG tablet  Commonly known as:  CELEXA  Take 20 mg by mouth daily.     divalproex 125 MG capsule  Commonly known as:  DEPAKOTE SPRINKLE  Take 1 capsule (125 mg total) by mouth every 12 (twelve) hours.     furosemide 40 MG tablet  Commonly known as:  LASIX  Take 1 tablet (40 mg total) by mouth 2 (two) times daily.     insulin NPH 100 UNIT/ML injection  Commonly known as:  HUMULIN N,NOVOLIN N  Inject 14 Units into the skin every morning. And pen needles 1/day     lisinopril 2.5 MG tablet  Commonly known as:  PRINIVIL,ZESTRIL  Take 1 tablet (2.5 mg total) by mouth daily.     OLANZapine 2.5 MG tablet  Commonly known as:  ZYPREXA  Take 1 tablet (2.5 mg total) by mouth at bedtime.       Allergies  Allergen Reactions  . Aspirin Nausea Only  . Codeine Nausea Only       Follow-up Information   Follow up with Verne Carrow, MD On 03/27/2013.   Specialty:  Cardiology   Contact information:   [address] [place] [address] [phone]       Follow up with Romero Belling, MD In 1 week.   Specialty:  Endocrinology   Contact information:   [address] [place] [address] [phone]        The results of significant diagnostics from this hospitalization (including imaging, microbiology, ancillary and laboratory) are listed below for reference.    Significant Diagnostic Studies: Dg Chest 2 View  03/07/2013   *RADIOLOGY REPORT*  Clinical Data: Dizziness and chest pain.  CHEST - 2 VIEW  Comparison: 04/04/2012  Findings: The cardiac enlargement with pulmonary vascular congestion, peribronchial thickening and perihilar edema or infiltration.  Bilateral small pleural effusions with basilar atelectasis or consolidation in both lungs.  Changes are all new since the previous study.  No pneumothorax.  Calcification of the aorta.  IMPRESSION: Pulmonary vascular congestion with perihilar edema,  bilateral pleural effusions, and basilar atelectasis or infiltration.   Original Report Authenticated By: Burman Nieves, M.D.   Nm Myocar Multi W/spect W/wall Motion / Ef  03/14/2013   Lexiscan Myovue: Indication: Chest Pain  The patient received .4 mg of Lexiscan as a bolus.  There was mild nausea and GI upset.  Baseline ECG showed NSR with LBBB.  HR stable at 75 bpm and BP not recored by tech.  RAW: motion artifact QPS: 7 abnormal in anterior apex EF:  33% with diffuse hypokinesis and anteroapical akinesis  SPECT:  Moderate anteroapical wall infarct with moderate perinfarct ischemia  Impression:  Intermediate risk myovue with moderate anteroapical wall MI and moderate peri infarct ischemia. EF severely reduced at 33% and looks worse on surfact images  Theron Arista  Eden Emms MD [facility]   Original Report Authenticated By: Charlton Haws, M.D.    Microbiology: No results found for this or any previous visit (from the past 240 hour(s)).   Labs: Basic Metabolic Panel:  Recent Labs Lab 03/10/13 0455 03/11/13 0437 03/14/13 0515  NA 138 138 141  K 3.4* 3.8 3.7  CL 98 99 103  CO2 33* 33* 32  GLUCOSE 209* 156* 100*  BUN 21 19 24*  CREATININE 0.58 0.60 0.77  CALCIUM 8.5 8.7 8.7  MG 1.8  --   --    Liver Function Tests: No results found for this basename: AST, ALT, ALKPHOS, BILITOT, PROT, ALBUMIN,  in the last 168 hours No results found for this basename: LIPASE, AMYLASE,  in the last 168 hours No results found for this basename: AMMONIA,  in the last 168 hours CBC: No results found for this basename: WBC, NEUTROABS, HGB, HCT, MCV, PLT,  in the last 168 hours Cardiac Enzymes: No results found for this basename: CKTOTAL, CKMB, CKMBINDEX, TROPONINI,  in the last 168 hours BNP: BNP (last 3 results)  Recent Labs  03/07/13 2132  PROBNP 1008.0*   CBG:  Recent Labs Lab 03/15/13 1207 03/15/13 1546 03/15/13 2105 03/16/13 0556 03/16/13 1142  GLUCAP 67* 104* 224* 190* 113*        Signed:  Kisha Messman N  [facility] 03/16/2013, 12:11 PM

## 2013-03-16 NOTE — Progress Notes (Signed)
Subjective: Patient is resting comfortably Objective: Filed Vitals:   03/15/13 1228 03/15/13 1705 03/15/13 2009 03/16/13 0543  BP: 111/78 106/68 127/70 108/63  Pulse: 79 77 77 75  Temp: 98.7 F (37.1 C)  98.5 F (36.9 C) 98.1 F (36.7 C)  TempSrc: Oral  Oral Oral  Resp: 18  19 18   Height:      Weight:    151 lb 0.2 oz (68.5 kg)  SpO2: 100%  97% 96%   Weight change: -2 lb 6.8 oz (-1.1 kg)  Intake/Output Summary (Last 24 hours) at 03/16/13 0824 Last data filed at 03/16/13 0601  Gross per 24 hour  Intake    843 ml  Output   1450 ml  Net   -607 ml   Net I/O since admit 3.1 L  General: patient sleeping, wakes a little  IN NAD Neck:  JVP is normal Heart: Regular rate and rhythm, without murmurs, rubs, gallops.  Lungs: Clear to auscultation.  No rales or wheezes. Exemities:  No edema.   Neuro: Grossly intact, nonfocal.  Tele:  SR Lab Results: Results for orders placed during the hospital encounter of 03/08/13 (from the past 24 hour(s))  GLUCOSE, CAPILLARY     Status: Abnormal   Collection Time    03/15/13 12:07 PM      Result Value Range   Glucose-Capillary 67 (*) 70 - 99 mg/dL  GLUCOSE, CAPILLARY     Status: Abnormal   Collection Time    03/15/13  3:46 PM      Result Value Range   Glucose-Capillary 104 (*) 70 - 99 mg/dL  GLUCOSE, CAPILLARY     Status: Abnormal   Collection Time    03/15/13  9:05 PM      Result Value Range   Glucose-Capillary 224 (*) 70 - 99 mg/dL  GLUCOSE, CAPILLARY     Status: Abnormal   Collection Time    03/16/13  5:56 AM      Result Value Range   Glucose-Capillary 190 (*) 70 - 99 mg/dL   Comment 1 Documented in Chart     Comment 2 Notify RN      Studies/Results: @RISRSLT24 @  Medications: Reviewed   @PROBHOSP @  1.  Acute systolic CHF  Most likely due to CAD  Myoview with evidence of anterolateral scar with periinfarct ischemia.  LVEF 25 to 30%WIth dementia would plan medical Rx.    Patient appears comfortable  Current volume status  OK  COntinue coreg, lisinopril, lasix. Would continue   I have made appt for her to see Darral Dash on Sept 8 at 11:45 ([address] [facility].  )   LOS: 8 days   Dietrich Pates 03/16/2013, 8:24 AM

## 2013-03-16 NOTE — Clinical Social Work Psychosocial (Signed)
     Clinical Social Work Department BRIEF PSYCHOSOCIAL ASSESSMENT 03/16/2013  Patient:  Dana [name], Dana [name]     Account Number:  [id number]     Admit date:  03/07/2013  Clinical Social Worker:  Tiburcio Pea  Date/Time:  03/16/2013 01:30 PM  Referred by:  Physician  Date Referred:  03/16/2013 Referred for  SNF Placement   Other Referral:   Interview type:  Family Other interview type:   (Husband)    PSYCHOSOCIAL DATA Living Status:  HUSBAND Admitted from facility:   Level of care:   Primary support name:  Mattie Nordell   [phone] Primary support relationship to patient:  SPOUSE Degree of support available:   Strong support    CURRENT CONCERNS Current Concerns  Post-Acute Placement   Other Concerns:   SNF vs home with Home heatlh    SOCIAL WORK ASSESSMENT / PLAN 67 year old female admitted from home where she lives with her husband [name].  Patient is currently very confused and has been seen by Psychiatry and deemed to not have capacity.  She has dementia but is able to carry on a conversation.  CSW spoke with her husband yesterday regariding PT's recommendation for SNF placmement. He was very conflicted and wanted CSW to discuss with hisdaughter [name]. CSW has left 5 messages for daughter with no return call.  Per MD- patient is medically ready today for d/c. CSW contacted husband who stated that his daughter and son both want patient to return home and he agrees. He will accept home health. Discussed with RNCM and MD. No further CSW needs. CSW signing off.   Assessment/plan status:  No Further Intervention Required Other assessment/ plan:   Information/referral to community resources:   Discussed home health needs and husband agrees.    PATIENTS/FAMILYS RESPONSE TO PLAN OF CARE: Patient is alert but very confused. She states that she wants to return home with husband.  Her husband is concerned about managing her care at home and wanted to consult with his daughter  and son and states that they both want patient to return home. Thus, he will plan to take patient home today with home health follow RNCM is aware. CSW signing. [name]. [name], [name]   [phone].

## 2013-03-16 NOTE — Progress Notes (Signed)
DC IV, DC Tele, DC Home. Discharge instructions and home medications discussed with patient and patient's family members. Patient and family members denied any questions or concerns at this time. Patient leaving unit via wheelchair and appears in no acute distress. 

## 2013-03-16 NOTE — Progress Notes (Signed)
Pt.A/Ox1, she had no c/o pain and no signs of distress during the shift. Sitter at bedside.

## 2013-03-16 NOTE — Progress Notes (Signed)
Late Entry- CSW received call from patient's daughter Ruther Ephraim ([phone]) late this afternoon. She stated that she has discussed with her father and brother patient's current care needs and confirmed that they want to bring her back home. [place] [address] [place] and can be at the home in a few minutes. Daughter had questions regarding the possibility of her being "paid" to be her mother's caregiver as she is employed as a Product/process development scientist. She feels that her mother will be resistant to other females coming into the home as a caregiver but knows that her father needs help with her.  CSW was not able to provide any resources for this but encouraged her to talk with her father to see if any type of financial arrangement could be made between the 2 of them.  She stated that she would talk to her father. She is aware of d/c today and is supportive of same. CSW signing off.  Lorri Frederick. [place]  [phone]

## 2013-03-24 ENCOUNTER — Encounter: Payer: Self-pay | Admitting: Endocrinology

## 2013-03-24 ENCOUNTER — Ambulatory Visit (INDEPENDENT_AMBULATORY_CARE_PROVIDER_SITE_OTHER): Payer: Medicare Other | Admitting: Endocrinology

## 2013-03-24 ENCOUNTER — Telehealth: Payer: Self-pay

## 2013-03-24 VITALS — BP 136/70 | HR 100 | Ht 63.0 in | Wt 159.0 lb

## 2013-03-24 DIAGNOSIS — I509 Heart failure, unspecified: Secondary | ICD-10-CM

## 2013-03-24 DIAGNOSIS — E109 Type 1 diabetes mellitus without complications: Secondary | ICD-10-CM

## 2013-03-24 DIAGNOSIS — I5021 Acute systolic (congestive) heart failure: Secondary | ICD-10-CM

## 2013-03-24 LAB — BASIC METABOLIC PANEL
CO2: 36 mEq/L — ABNORMAL HIGH (ref 19–32)
Chloride: 98 mEq/L (ref 96–112)
Glucose, Bld: 133 mg/dL — ABNORMAL HIGH (ref 70–99)
Sodium: 139 mEq/L (ref 135–145)

## 2013-03-24 MED ORDER — INSULIN LISPRO PROT & LISPRO (75-25 MIX) 100 UNIT/ML KWIKPEN: 16.0000 [IU] | PEN_INJECTOR | Freq: Every day | SUBCUTANEOUS | Status: DC

## 2013-03-24 NOTE — Telephone Encounter (Signed)
Betha Loa with [facility] left voicemail [phone]) requesting home health nurse and social worker

## 2013-03-24 NOTE — Telephone Encounter (Signed)
ok 

## 2013-03-24 NOTE — Patient Instructions (Addendum)
Please change to "75/25" insulin, 16 units each morning. i have sent a prescription to your pharmacy Please come back for a follow-up appointment in 2 weeks.   check your blood sugar twice a day.  vary the time of day when you check, between before the 3 meals, and at bedtime.  also check if you have symptoms of your blood sugar being too high or too low.  please keep a record of the readings and bring it to your next appointment here.  please call us sooner if your blood sugar goes below 70, or if you have a lot of readings over 200. blood tests are being requested for you today.  We'll contact you with results.

## 2013-03-24 NOTE — Progress Notes (Signed)
Subjective:    Patient ID: Dana Dana, female    DOB: 1946/02/02, 67 y.o.   MRN: [id number]  HPI The state of at least three ongoing medical problems is addressed today, with interval history of each noted here: She returns for f/u of type 2 DM (dx'ed 1987; no known complications; therapy has been limited by severe noncompliance and dementia; she was admitted in march of 2014, with severe hyperglycemia).  Husband checks cbg's and gives pt insulin inections.  It varies from 88-400.  It is in general higher as the day goes on.     CHF: she has slight doe Pt was rx'ed for delirium in the hospital.  Pt is less restless at night since she has been at home. . Past Medical History  Diagnosis Date  . ALLERGIC RHINITIS 02/08/2007  . ANXIETY 02/08/2007  . Cough 08/15/2008  . DEPRESSION 02/08/2007  . DIABETES MELLITUS, TYPE I 02/08/2007  . Headache(784.0) 11/06/2008  . HYPERCHOLESTEROLEMIA 11/05/2008  . HYPERTENSION 02/08/2007  . URINARY INCONTINENCE 02/08/2007  . Dyslipidemia   . Non-ulcer dyspepsia   . Leukopenia   . DM retinopathy     background  . 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 Dementia     Past Surgical History  Procedure Laterality Date  . Total knee arthroplasty  1990    left  . Panendoscopy  10/27/1999  . Stress cardiolite  12/14/2003  . Electrocardiogram  05/17/2006  . Abdominal hysterectomy      History   Social History  . Marital Status: Married    Spouse Name: N/A    Number of Children: N/A  . Years of Education: N/A   Occupational History  . Retired    Social History Main Topics  . Smoking status: Never Smoker   . Smokeless tobacco: Never Used  . Alcohol Use: No  . Drug Use: No  . Sexual Activity: No   Other Topics Concern  . Not on file   Social History Narrative  . No narrative on file    Current Outpatient Prescriptions on File Prior to Visit  Medication Sig Dispense Refill  . aspirin EC 325 MG EC tablet Take 1 tablet (325 mg total)  by mouth daily.  30 tablet  0  . carvedilol (COREG) 3.125 MG tablet Take 1 tablet (3.125 mg total) by mouth 2 (two) times daily with a meal.  60 tablet  1  . citalopram (CELEXA) 20 MG tablet Take 20 mg by mouth daily.      . divalproex (DEPAKOTE SPRINKLE) 125 MG capsule Take 1 capsule (125 mg total) by mouth every 12 (twelve) hours.  60 capsule  0  . furosemide (LASIX) 40 MG tablet Take 1 tablet (40 mg total) by mouth 2 (two) times daily.  30 tablet  0  . lisinopril (PRINIVIL,ZESTRIL) 2.5 MG tablet Take 1 tablet (2.5 mg total) by mouth daily.  30 tablet  0  . OLANZapine (ZYPREXA) 2.5 MG tablet Take 1 tablet (2.5 mg total) by mouth at bedtime.  30 tablet  0   No current facility-administered medications on file prior to visit.    Allergies  Allergen Reactions  . Aspirin Nausea Only  . Codeine Nausea Only    Family History  Problem Relation Age of Onset  . Alcohol abuse Son   . Cancer Neg Hx     no cancer in immediate family  . CAD Father     BP 136/70  Pulse 100  Ht 5\' 3"  (1.6 m)  Wt 159 lb (72.122 kg)  BMI 28.17 kg/m2  SpO2 98%  Review of Systems denies hypoglycemia and weight change    Objective:   Physical Exam VITAL SIGNS:  See vs page GENERAL: no distress LUNGS:  Clear to auscultation  Lab Results  Component Value Date   CREATININE 0.7 03/24/2013   BUN 21 03/24/2013   NA 139 03/24/2013   K 3.7 03/24/2013   CL 98 03/24/2013   CO2 36* 03/24/2013      Assessment & Plan:  DM: Based on the pattern of her cbg's, she needs some adjustment in her therapy Dementia: restlessness is improved.  CHF: improved

## 2013-03-27 ENCOUNTER — Telehealth: Payer: Self-pay

## 2013-03-27 ENCOUNTER — Ambulatory Visit (INDEPENDENT_AMBULATORY_CARE_PROVIDER_SITE_OTHER): Payer: Medicare Other | Admitting: Cardiovascular Disease

## 2013-03-27 ENCOUNTER — Encounter: Payer: Self-pay | Admitting: Cardiovascular Disease

## 2013-03-27 VITALS — BP 134/78 | HR 77 | Ht 63.0 in | Wt 157.0 lb

## 2013-03-27 DIAGNOSIS — I5022 Chronic systolic (congestive) heart failure: Secondary | ICD-10-CM

## 2013-03-27 DIAGNOSIS — I2589 Other forms of chronic ischemic heart disease: Secondary | ICD-10-CM

## 2013-03-27 DIAGNOSIS — I255 Ischemic cardiomyopathy: Secondary | ICD-10-CM

## 2013-03-27 DIAGNOSIS — I509 Heart failure, unspecified: Secondary | ICD-10-CM

## 2013-03-27 MED ORDER — FUROSEMIDE 40 MG PO TABS: ORAL_TABLET | ORAL | Status: DC

## 2013-03-27 MED ORDER — POTASSIUM CHLORIDE CRYS ER 20 MEQ PO TBCR: 20.0000 meq | EXTENDED_RELEASE_TABLET | Freq: Every day | ORAL | Status: DC

## 2013-03-27 NOTE — Telephone Encounter (Signed)
Left message verbal ok

## 2013-03-27 NOTE — Addendum Note (Signed)
Addended by: Williemae Area on: 03/27/2013 02:18 PM   Modules accepted: Orders

## 2013-03-27 NOTE — Telephone Encounter (Signed)
Nickola Major [facility] left voicemail pt needs to be referred to a home health agency and social worker [phone]

## 2013-03-27 NOTE — Patient Instructions (Addendum)
Your physician recommends that you schedule a follow-up appointment in:  3 months. --June 27, 2013 at 2:15  Your physician has recommended you make the following change in your medication:  Increase furosemide to 80 mg every morning and 40 mg every afternoon. Start potassium 20 meq by mouth daily

## 2013-03-27 NOTE — Progress Notes (Signed)
History of Present Illness: 67 yo female with history of HTN, DM, HLD, anxiety and depression admitted with lower extremity edema, dyspnea on exertion x 3 months. No prior heart disease. Echo 03/09/13 with LVEF 25-30%. No significant valve disease. We discussed cardiac cath to exclude CAD but she was very agitated at times requiring a sitter (presumed to be from baseline dementia). She was combative with nursing staff. Stress myoview 03/14/13 was intermediate risk myoview with moderate anteroapical wall MI and moderate peri infarct ischemia. EF severely reduced at 33%. Given her dementia, she was managed conservatively. She was diuresed well during admission and discharged home on Lasix 40 mg po BID.   She has been doing well since discharge. No chest pain or SOB. Some residual LE edema.   Primary Care Physician: Romero Belling  Last Lipid Profile:Lipid Panel     Component Value Date/Time   CHOL 247* 04/05/2012 0930   TRIG 39 04/05/2012 0930   HDL 103 04/05/2012 0930   CHOLHDL 2.4 04/05/2012 0930   VLDL 8 04/05/2012 0930   LDLCALC 136* 04/05/2012 0930     Past Medical History  Diagnosis Date  . ALLERGIC RHINITIS 02/08/2007  . ANXIETY 02/08/2007  . Cough 08/15/2008  . DEPRESSION 02/08/2007  . DIABETES MELLITUS, TYPE I 02/08/2007  . Headache(784.0) 11/06/2008  . HYPERCHOLESTEROLEMIA 11/05/2008  . HYPERTENSION 02/08/2007  . URINARY INCONTINENCE 02/08/2007  . Dyslipidemia   . Non-ulcer dyspepsia   . Leukopenia   . DM retinopathy     background  . 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 Dementia     Past Surgical History  Procedure Laterality Date  . Total knee arthroplasty  1990    left  . Panendoscopy  10/27/1999  . Stress cardiolite  12/14/2003  . Electrocardiogram  05/17/2006  . Abdominal hysterectomy      Current Outpatient Prescriptions  Medication Sig Dispense Refill  . aspirin 325 MG EC tablet Take 325 mg by mouth every 6 (six) hours as needed.      . carvedilol  (COREG) 3.125 MG tablet Take 1 tablet (3.125 mg total) by mouth 2 (two) times daily with a meal.  60 tablet  1  . citalopram (CELEXA) 20 MG tablet Take 20 mg by mouth daily.      . divalproex (DEPAKOTE SPRINKLE) 125 MG capsule Take 1 capsule (125 mg total) by mouth every 12 (twelve) hours.  60 capsule  0  . furosemide (LASIX) 40 MG tablet Take 2 tablets by mouth every morning and 1 tablet every afternoon  90 tablet  6  . Insulin Lispro Prot & Lispro (HUMALOG MIX 75/25 KWIKPEN) (75-25) 100 UNIT/ML SUPN Inject 16 Units into the skin daily with breakfast. And pen needles 1/day  5 pen  11  . lisinopril (PRINIVIL,ZESTRIL) 2.5 MG tablet Take 1 tablet (2.5 mg total) by mouth daily.  30 tablet  0  . OLANZapine (ZYPREXA) 2.5 MG tablet Take 1 tablet (2.5 mg total) by mouth at bedtime.  30 tablet  0  . potassium chloride SA (K-DUR,KLOR-CON) 20 MEQ tablet Take 1 tablet (20 mEq total) by mouth daily.  30 tablet  6   No current facility-administered medications for this visit.    Allergies  Allergen Reactions  . Aspirin Nausea Only  . Codeine Nausea Only    History   Social History  . Marital Status: Married    Spouse Name: N/A    Number of Children: N/A  . Years  of Education: N/A   Occupational History  . Retired    Social History Main Topics  . Smoking status: Never Smoker   . Smokeless tobacco: Never Used  . Alcohol Use: No  . Drug Use: No  . Sexual Activity: No   Other Topics Concern  . Not on file   Social History Narrative  . No narrative on file    Family History  Problem Relation Age of Onset  . Alcohol abuse Son   . Cancer Neg Hx     no cancer in immediate family  . CAD Father     Review of Systems:  As stated in the HPI and otherwise negative.   BP 134/78  Pulse 77  Ht 5\' 3"  (1.6 m)  Wt 157 lb (16.109 kg)  BMI 27.82 kg/m2  SpO2 97%  Physical Examination: General: Well developed, well nourished, NAD HEENT: OP clear, mucus membranes moist SKIN: warm, dry. No  rashes. Neuro: No focal deficits Musculoskeletal: Muscle strength 5/5 all ext Psychiatric: Mood and affect normal Neck: No JVD, no carotid bruits, no thyromegaly, no lymphadenopathy. Lungs:Clear bilaterally, no wheezes, rhonci, crackles Cardiovascular: Regular rate and rhythm. No murmurs, gallops or rubs. Abdomen:Soft. Bowel sounds present. Non-tender.  Extremities: No lower extremity edema. Pulses are 2 + in the bilateral DP/PT.  EKG: NSR, rate 77 bpm. Old septal infarct. Lateral T wave inversions.   Echo 03/09/13: Left ventricle: The cavity size was normal. Wall thickness was normal. Systolic function was severely reduced. The estimated ejection fraction was in the range of 25% to 30%. There is akinesis of the anteroseptal and apical myocardium. There is hypokinesis of the inferior myocardium. Doppler parameters are consistent with abnormal left ventricular relaxation (grade 1 diastolic dysfunction). - Mitral valve: Calcified annulus. Mildly thickened leaflets - Left atrium: The atrium was mildly dilated. - Pericardium, extracardiac: A trivial pericardial effusion  Assessment and Plan:   1. Ischemic Cardiomyopathy: Pt with new diagnosis in hospital with LVEF=25-30%. Stress myoview with possible ischemia. She has no chest pain. She is presumed to have CAD but given her profound dementia as witnessed in the hospital and lack of chest pain, we have elected for medical management only at this time. Continue medical therapy  2. Acute on chronic systolic CHF: Slight worsening of LE edema. Some dyspnea. Will increase Lasix to 80 mg po QAM, 40 mg po QPM. Follow weights closely at home.

## 2013-03-27 NOTE — Telephone Encounter (Signed)
ok 

## 2013-03-29 ENCOUNTER — Telehealth: Payer: Self-pay | Admitting: Endocrinology

## 2013-03-29 NOTE — Telephone Encounter (Signed)
Please call. Aloha Gell [facility] Care Management [phone], re: follow up on Monday call regarding Home Health for pt / Sherri S.

## 2013-03-29 NOTE — Telephone Encounter (Signed)
Donn Pierini advised Dr. Everardo All put in a referral request for hh and social work

## 2013-04-07 ENCOUNTER — Ambulatory Visit: Payer: Self-pay | Admitting: Endocrinology

## 2013-04-07 DIAGNOSIS — Z0289 Encounter for other administrative examinations: Secondary | ICD-10-CM

## 2013-04-12 ENCOUNTER — Other Ambulatory Visit (HOSPITAL_COMMUNITY): Payer: Self-pay | Admitting: Cardiovascular Disease

## 2013-04-13 ENCOUNTER — Telehealth: Payer: Self-pay | Admitting: *Deleted

## 2013-04-13 NOTE — Telephone Encounter (Signed)
[name] with [facility] called stating that she was concerned about pt. She stated that she thought that home health was to be set up for pt since coming home from the hospital. She stated that she feels the pt needs a PT eval; she has had difficulty walking/has fallen. She also stated pt and husband need diabetic education and CHS management. Please advise. [phone])

## 2013-04-13 NOTE — Telephone Encounter (Signed)
First, pt needs to show up for appt here.  Let's start there.  Ov next avail appt.

## 2013-04-14 NOTE — Telephone Encounter (Signed)
Pt has an appt scheduled for Mon, Sept 29 th. Dana [name] with [facility] to let her know. Be advised.

## 2013-04-17 ENCOUNTER — Ambulatory Visit (INDEPENDENT_AMBULATORY_CARE_PROVIDER_SITE_OTHER): Payer: Medicare Other | Admitting: Endocrinology

## 2013-04-17 ENCOUNTER — Encounter: Payer: Self-pay | Admitting: Endocrinology

## 2013-04-17 VITALS — BP 124/76 | HR 78 | Wt 146.0 lb

## 2013-04-17 DIAGNOSIS — Z23 Encounter for immunization: Secondary | ICD-10-CM

## 2013-04-17 DIAGNOSIS — E109 Type 1 diabetes mellitus without complications: Secondary | ICD-10-CM

## 2013-04-17 NOTE — Patient Instructions (Addendum)
Please increase the  "75/25" insulin to 25 units each morning.   Please come back for a follow-up appointment in 2 weeks.   check your blood sugar twice a day.  vary the time of day when you check, between before the 3 meals, and at bedtime.  also check if you have symptoms of your blood sugar being too high or too low.  please keep a record of the readings and bring it to your next appointment here.  please call us sooner if your blood sugar goes below 70, or if you have a lot of readings over 200. it is critically important to prevent falling down (keep floor areas well-lit, dry, and free of loose objects.  If you have a cane, walker, or wheelchair, you should use it, even for short trips around the house.  Also, try not to rush).   Your unsteadiness on your feet may improve as your sugar improves.

## 2013-04-17 NOTE — Progress Notes (Signed)
Subjective:    Patient ID: Dana [name], female    DOB: 1946/07/12, 67 y.o.   MRN: [id number]  HPI She returns for f/u of type 2 DM (dx'ed 1987; no known complications; therapy has been limited by severe noncompliance and dementia; she was admitted in march of 2014, with severe hyperglycemia; husband checks cbg's and gives pt insulin injections).  no cbg record, but states cbg's are persistently 200-400.  There is no trend throughout the day.  Pt says she is slightly unsteady on her feet. Pt has a cane and walker, but uses neither.    Past Medical History  Diagnosis Date  . ALLERGIC RHINITIS 02/08/2007  . ANXIETY 02/08/2007  . Cough 08/15/2008  . DEPRESSION 02/08/2007  . DIABETES MELLITUS, TYPE I 02/08/2007  . Headache(784.0) 11/06/2008  . HYPERCHOLESTEROLEMIA 11/05/2008  . HYPERTENSION 02/08/2007  . URINARY INCONTINENCE 02/08/2007  . Dyslipidemia   . Non-ulcer dyspepsia   . Leukopenia   . DM retinopathy     background  . 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 Dementia     Past Surgical History  Procedure Laterality Date  . Total knee arthroplasty  1990    left  . Panendoscopy  10/27/1999  . Stress cardiolite  12/14/2003  . Electrocardiogram  05/17/2006  . Abdominal hysterectomy      History   Social History  . Marital Status: Married    Spouse Name: N/A    Number of Children: N/A  . Years of Education: N/A   Occupational History  . Retired    Social History Main Topics  . Smoking status: Never Smoker   . Smokeless tobacco: Never Used  . Alcohol Use: No  . Drug Use: No  . Sexual Activity: No   Other Topics Concern  . Not on file   Social History Narrative  . No narrative on file    Current Outpatient Prescriptions on File Prior to Visit  Medication Sig Dispense Refill  . aspirin EC 325 MG tablet TAKE 1 TABLET (325 MG TOTAL) BY MOUTH DAILY.  30 tablet  6  . carvedilol (COREG) 3.125 MG tablet Take 1 tablet (3.125 mg total) by mouth 2 (two) times daily  with a meal.  60 tablet  1  . citalopram (CELEXA) 20 MG tablet Take 20 mg by mouth daily.      . divalproex (DEPAKOTE SPRINKLE) 125 MG capsule Take 1 capsule (125 mg total) by mouth every 12 (twelve) hours.  60 capsule  0  . furosemide (LASIX) 40 MG tablet Take 2 tablets by mouth every morning and 1 tablet every afternoon  90 tablet  6  . lisinopril (PRINIVIL,ZESTRIL) 2.5 MG tablet TAKE 1 TABLET (2.5 MG TOTAL) BY MOUTH DAILY.  30 tablet  6  . OLANZapine (ZYPREXA) 2.5 MG tablet Take 1 tablet (2.5 mg total) by mouth at bedtime.  30 tablet  0  . potassium chloride SA (K-DUR,KLOR-CON) 20 MEQ tablet Take 1 tablet (20 mEq total) by mouth daily.  30 tablet  6   No current facility-administered medications on file prior to visit.    Allergies  Allergen Reactions  . Aspirin Nausea Only  . Codeine Nausea Only   Family History  Problem Relation Age of Onset  . Alcohol abuse Son   . Cancer Neg Hx     no cancer in immediate family  . CAD Father    BP 124/76  Pulse 78  Wt 146 lb (66.225 kg)  BMI 25.87 kg/m2  SpO2 97%  Review of Systems denies hypoglycemia.  She has lost weight.     Objective:   Physical Exam VITAL SIGNS:  See vs page GENERAL: no distress Gait: slow but steady.     Assessment & Plan:  DM: This insulin regimen was chosen from multiple options, for its simplicity.  The benefits of glycemic control must be weighed against the risks of hypoglycemia.   Gait difficulty, possibly due to DM: She declines physical therapy andother home-health services.   Dementia: this complicates the rx of DM.

## 2013-04-18 ENCOUNTER — Telehealth: Payer: Self-pay | Admitting: Physician Assistant

## 2013-04-18 ENCOUNTER — Other Ambulatory Visit (HOSPITAL_COMMUNITY): Payer: Self-pay | Admitting: Cardiovascular Disease

## 2013-04-18 NOTE — Telephone Encounter (Signed)
Pt's husband called in to after-hours voicemail line. I was not on call but the page erroneously came to me so I returned it. The patient will soon need refills of her Lasix, Lisinopril, ASA, and Klor-Con. She has about 5-8 tablets of each left per his report so is not acutely out. They were told by the pharmacist a few days ago that the pills would have to be authorized by MD. I see from Epic that her Lasix, Lisinopril, and Klor Con were all e-prescribed on 03/27/13 and ASA was e-prescribed on 04/12/13 to [facility] on [address]. This is the pharmacy he says he called. Each of these medicines states "Receipt confirmed by pharmacy" so not sure what the issue is. We don't typically do refills after-hours and given that she has an adequate supply to last at least 2+ days, I have advised them to call our office first thing in the morning to speak with Dr. Gibson Ramp nurse for assistance. He verbalized understanding and gratitude. Thaddius Manes PA-C

## 2013-04-18 NOTE — Telephone Encounter (Signed)
I spoke with [facility] on [address] and they have active prescription with refills. I spoke with pt's husband and gave him this information.

## 2013-04-19 ENCOUNTER — Other Ambulatory Visit: Payer: Self-pay

## 2013-04-19 ENCOUNTER — Other Ambulatory Visit (HOSPITAL_COMMUNITY): Payer: Self-pay | Admitting: Endocrinology

## 2013-04-19 MED ORDER — OLANZAPINE 2.5 MG PO TABS: 2.5000 mg | ORAL_TABLET | Freq: Every day | ORAL | Status: DC

## 2013-04-19 MED ORDER — DIVALPROEX SODIUM 125 MG PO CPSP: 125.0000 mg | ORAL_CAPSULE | Freq: Two times a day (BID) | ORAL | Status: DC

## 2013-05-12 ENCOUNTER — Other Ambulatory Visit: Payer: Self-pay

## 2013-05-12 MED ORDER — CARVEDILOL 3.125 MG PO TABS: 3.1250 mg | ORAL_TABLET | Freq: Two times a day (BID) | ORAL | Status: DC

## 2013-05-19 ENCOUNTER — Encounter: Payer: Self-pay | Admitting: Endocrinology

## 2013-05-19 ENCOUNTER — Ambulatory Visit (INDEPENDENT_AMBULATORY_CARE_PROVIDER_SITE_OTHER): Payer: Medicare Other | Admitting: Endocrinology

## 2013-05-19 VITALS — BP 112/80 | HR 89 | Temp 98.1°F | Resp 10 | Wt 151.8 lb

## 2013-05-19 DIAGNOSIS — E109 Type 1 diabetes mellitus without complications: Secondary | ICD-10-CM

## 2013-05-19 MED ORDER — INSULIN LISPRO PROT & LISPRO (50-50 MIX) 100 UNIT/ML KWIKPEN: 25.0000 [IU] | PEN_INJECTOR | Freq: Every day | SUBCUTANEOUS | Status: DC

## 2013-05-19 NOTE — Progress Notes (Signed)
Subjective:    Patient ID: Dana [name], female    DOB: 28-Jan-1946, 67 y.o.   MRN: [id number]  HPI She returns for f/u of type 2 DM (dx'ed 1987, on a routine blood test; no known complications; therapy has been limited by severe noncompliance and dementia; she was admitted in march of 2014, with severe hyperglycemia; husband checks cbg's and gives pt insulin injections).  she brings a record of her cbg's which i have reviewed today.  It varies from 70-257, but most are in the 100's.  It is in general higher as the day goes on.   Gait difficulty has improved.   Past Medical History  Diagnosis Date  . ALLERGIC RHINITIS 02/08/2007  . ANXIETY 02/08/2007  . Cough 08/15/2008  . DEPRESSION 02/08/2007  . DIABETES MELLITUS, TYPE I 02/08/2007  . Headache(784.0) 11/06/2008  . HYPERCHOLESTEROLEMIA 11/05/2008  . HYPERTENSION 02/08/2007  . URINARY INCONTINENCE 02/08/2007  . Dyslipidemia   . Non-ulcer dyspepsia   . Leukopenia   . DM retinopathy     background  . 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 Dementia     Past Surgical History  Procedure Laterality Date  . Total knee arthroplasty  1990    left  . Panendoscopy  10/27/1999  . Stress cardiolite  12/14/2003  . Electrocardiogram  05/17/2006  . Abdominal hysterectomy      History   Social History  . Marital Status: Married    Spouse Name: N/A    Number of Children: N/A  . Years of Education: N/A   Occupational History  . Retired    Social History Main Topics  . Smoking status: Never Smoker   . Smokeless tobacco: Never Used  . Alcohol Use: No  . Drug Use: No  . Sexual Activity: No   Other Topics Concern  . Not on file   Social History Narrative  . No narrative on file    Current Outpatient Prescriptions on File Prior to Visit  Medication Sig Dispense Refill  . aspirin EC 325 MG tablet TAKE 1 TABLET (325 MG TOTAL) BY MOUTH DAILY.  30 tablet  6  . carvedilol (COREG) 3.125 MG tablet Take 1 tablet (3.125 mg total) by  mouth 2 (two) times daily with a meal.  60 tablet  1  . citalopram (CELEXA) 20 MG tablet Take 20 mg by mouth daily.      . divalproex (DEPAKOTE SPRINKLE) 125 MG capsule Take 1 capsule (125 mg total) by mouth every 12 (twelve) hours.  60 capsule  3  . furosemide (LASIX) 40 MG tablet Take 2 tablets by mouth every morning and 1 tablet every afternoon  90 tablet  6  . lisinopril (PRINIVIL,ZESTRIL) 2.5 MG tablet TAKE 1 TABLET (2.5 MG TOTAL) BY MOUTH DAILY.  30 tablet  6  . OLANZapine (ZYPREXA) 2.5 MG tablet Take 1 tablet (2.5 mg total) by mouth at bedtime.  30 tablet  5  . potassium chloride SA (K-DUR,KLOR-CON) 20 MEQ tablet Take 1 tablet (20 mEq total) by mouth daily.  30 tablet  6   No current facility-administered medications on file prior to visit.    Allergies  Allergen Reactions  . Aspirin Nausea Only  . Codeine Nausea Only    Family History  Problem Relation Age of Onset  . Alcohol abuse Son   . Cancer Neg Hx     no cancer in immediate family  . CAD Father     BP 112/80  Pulse 89  Temp(Src) 98.1 F (36.7 C) (Oral)  Resp 10  Wt 151 lb 12.8 oz (68.856 kg)  BMI 26.9 kg/m2  SpO2 96%  Review of Systems denies hypoglycemia and weight change.     Objective:   Physical Exam VITAL SIGNS:  See vs page GENERAL: no distress Gait: slow but steady      Assessment & Plan:  DM: This insulin regimen was chosen from multiple options, as it best matches his insulin to his changing requirements throughout the day.  The benefits of glycemic control must be weighed against the risks of hypoglycemia.  Based on the pattern of her cbg's, she needs a shorter-acting insulin. Gait difficulty, uncertain etiology, improved

## 2013-05-19 NOTE — Patient Instructions (Addendum)
Please change the  "75/25" insulin to "50/50," 25 units each morning.  i have sent a prescription to your pharmacy.  On this type of insulin schedule, you should eat meals on a regular schedule.  If a meal is missed or significantly delayed, your blood sugar could go low.  Please come back for a regular physical appointment in 1 month.   check your blood sugar twice a day.  vary the time of day when you check, between before the 3 meals, and at bedtime.  also check if you have symptoms of your blood sugar being too high or too low.  please keep a record of the readings and bring it to your next appointment here.  please call us sooner if your blood sugar goes below 70, or if you have a lot of readings over 200.   When you see Cr McAlhany, please ask him about your dizziness.

## 2013-06-20 ENCOUNTER — Encounter: Payer: Self-pay | Admitting: Endocrinology

## 2013-06-27 ENCOUNTER — Ambulatory Visit: Payer: Self-pay | Admitting: Cardiovascular Disease

## 2013-06-27 ENCOUNTER — Other Ambulatory Visit: Payer: Self-pay | Admitting: Endocrinology

## 2013-07-05 ENCOUNTER — Encounter: Payer: Self-pay | Admitting: Endocrinology

## 2013-07-05 ENCOUNTER — Ambulatory Visit (INDEPENDENT_AMBULATORY_CARE_PROVIDER_SITE_OTHER): Payer: Medicare Other | Admitting: Endocrinology

## 2013-07-05 VITALS — BP 116/60 | HR 86 | Temp 98.2°F | Ht 63.0 in | Wt 156.0 lb

## 2013-07-05 DIAGNOSIS — Z79899 Other long term (current) drug therapy: Secondary | ICD-10-CM

## 2013-07-05 DIAGNOSIS — H04203 Unspecified epiphora, bilateral lacrimal glands: Secondary | ICD-10-CM | POA: Insufficient documentation

## 2013-07-05 DIAGNOSIS — H04209 Unspecified epiphora, unspecified lacrimal gland: Secondary | ICD-10-CM

## 2013-07-05 DIAGNOSIS — E7800 Pure hypercholesterolemia, unspecified: Secondary | ICD-10-CM

## 2013-07-05 DIAGNOSIS — IMO0001 Reserved for inherently not codable concepts without codable children: Secondary | ICD-10-CM

## 2013-07-05 DIAGNOSIS — E1165 Type 2 diabetes mellitus with hyperglycemia: Secondary | ICD-10-CM

## 2013-07-05 DIAGNOSIS — F329 Major depressive disorder, single episode, unspecified: Secondary | ICD-10-CM

## 2013-07-05 DIAGNOSIS — Z0000 Encounter for general adult medical examination without abnormal findings: Secondary | ICD-10-CM | POA: Insufficient documentation

## 2013-07-05 DIAGNOSIS — I10 Essential (primary) hypertension: Secondary | ICD-10-CM

## 2013-07-05 DIAGNOSIS — E109 Type 1 diabetes mellitus without complications: Secondary | ICD-10-CM

## 2013-07-05 DIAGNOSIS — F3289 Other specified depressive episodes: Secondary | ICD-10-CM

## 2013-07-05 LAB — CBC WITH DIFFERENTIAL/PLATELET
Basophils Relative: 1.7 % (ref 0.0–3.0)
Eosinophils Relative: 7.5 % — ABNORMAL HIGH (ref 0.0–5.0)
HCT: 35.1 % — ABNORMAL LOW (ref 36.0–46.0)
Lymphocytes Relative: 20.5 % (ref 12.0–46.0)
Monocytes Relative: 13.5 % — ABNORMAL HIGH (ref 3.0–12.0)
Neutrophils Relative %: 56.8 % (ref 43.0–77.0)
Platelets: 208 10*3/uL (ref 150.0–400.0)
RBC: 4.17 Mil/uL (ref 3.87–5.11)
WBC: 4 10*3/uL — ABNORMAL LOW (ref 4.5–10.5)

## 2013-07-05 LAB — LIPID PANEL
Cholesterol: 244 mg/dL — ABNORMAL HIGH (ref 0–200)
Total CHOL/HDL Ratio: 3
VLDL: 17 mg/dL (ref 0.0–40.0)

## 2013-07-05 LAB — URINALYSIS, ROUTINE W REFLEX MICROSCOPIC
Bilirubin Urine: NEGATIVE
Leukocytes, UA: NEGATIVE
Nitrite: NEGATIVE
Specific Gravity, Urine: 1.01 (ref 1.000–1.030)
Total Protein, Urine: NEGATIVE
Urine Glucose: 100 — AB
Urobilinogen, UA: 0.2 (ref 0.0–1.0)
pH: 5.5 (ref 5.0–8.0)

## 2013-07-05 LAB — HEPATIC FUNCTION PANEL
ALT: 21 U/L (ref 0–35)
AST: 18 U/L (ref 0–37)
Albumin: 3.8 g/dL (ref 3.5–5.2)
Total Bilirubin: 0.6 mg/dL (ref 0.3–1.2)

## 2013-07-05 LAB — BASIC METABOLIC PANEL
CO2: 34 mEq/L — ABNORMAL HIGH (ref 19–32)
Chloride: 101 mEq/L (ref 96–112)
GFR: 88.8 mL/min (ref >60.00)
Glucose, Bld: 107 mg/dL — ABNORMAL HIGH (ref 70–99)
Potassium: 4 mEq/L (ref 3.5–5.1)
Sodium: 141 mEq/L (ref 135–145)

## 2013-07-05 LAB — HEMOGLOBIN A1C: Hgb A1c MFr Bld: 10.3 % — ABNORMAL HIGH (ref 4.6–6.5)

## 2013-07-05 LAB — TSH: TSH: 0.69 u[IU]/mL (ref 0.35–5.50)

## 2013-07-05 LAB — LDL CHOLESTEROL, DIRECT: Direct LDL: 141.5 mg/dL

## 2013-07-05 NOTE — Progress Notes (Signed)
Subjective:    Patient ID: Dana [name], female    DOB: 1945/11/16, 67 y.o.   MRN: [id number]  HPI Pt is here for regular wellness examination, and is feeling pretty well in general, and says chronic med probs are stable, except as noted below Past Medical History  Diagnosis Date  . ALLERGIC RHINITIS 02/08/2007  . ANXIETY 02/08/2007  . Cough 08/15/2008  . DEPRESSION 02/08/2007  . DIABETES MELLITUS, TYPE I 02/08/2007  . Headache(784.0) 11/06/2008  . HYPERCHOLESTEROLEMIA 11/05/2008  . HYPERTENSION 02/08/2007  . URINARY INCONTINENCE 02/08/2007  . Dyslipidemia   . Non-ulcer dyspepsia   . Leukopenia   . DM retinopathy     background  . 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 Dementia     Past Surgical History  Procedure Laterality Date  . Total knee arthroplasty  1990    left  . Panendoscopy  10/27/1999  . Stress cardiolite  12/14/2003  . Electrocardiogram  05/17/2006  . Abdominal hysterectomy      History   Social History  . Marital Status: Married    Spouse Name: N/A    Number of Children: N/A  . Years of Education: N/A   Occupational History  . Retired    Social History Main Topics  . Smoking status: Never Smoker   . Smokeless tobacco: Never Used  . Alcohol Use: No  . Drug Use: No  . Sexual Activity: No   Other Topics Concern  . Not on file   Social History Narrative  . No narrative on file    Current Outpatient Prescriptions on File Prior to Visit  Medication Sig Dispense Refill  . Alcohol Swabs (ALCOHOL PREPS) PADS       . aspirin EC 325 MG tablet TAKE 1 TABLET (325 MG TOTAL) BY MOUTH DAILY.  30 tablet  6  . citalopram (CELEXA) 20 MG tablet Take 20 mg by mouth daily.      . citalopram (CELEXA) 20 MG tablet TAKE 1 TABLET (20 MG TOTAL) BY MOUTH DAILY.  30 tablet  3  . divalproex (DEPAKOTE SPRINKLE) 125 MG capsule Take 1 capsule (125 mg total) by mouth every 12 (twelve) hours.  60 capsule  3  . furosemide (LASIX) 40 MG tablet Take 2 tablets by mouth  every morning and 1 tablet every afternoon  90 tablet  6  . lisinopril (PRINIVIL,ZESTRIL) 2.5 MG tablet TAKE 1 TABLET (2.5 MG TOTAL) BY MOUTH DAILY.  30 tablet  6  . OLANZapine (ZYPREXA) 2.5 MG tablet Take 1 tablet (2.5 mg total) by mouth at bedtime.  30 tablet  5  . potassium chloride SA (K-DUR,KLOR-CON) 20 MEQ tablet Take 1 tablet (20 mEq total) by mouth daily.  30 tablet  6   No current facility-administered medications on file prior to visit.    Allergies  Allergen Reactions  . Aspirin Nausea Only  . Codeine Nausea Only    Family History  Problem Relation Age of Onset  . Alcohol abuse Son   . Cancer Neg Hx     no cancer in immediate family  . CAD Father     BP 116/60  Pulse 86  Temp(Src) 98.2 F (36.8 C) (Oral)  Ht 5\' 3"  (1.6 m)  Wt 156 lb (70.761 kg)  BMI 27.64 kg/m2  SpO2 97%  Review of Systems  Constitutional: Negative for fever and unexpected weight change.  HENT: Negative for hearing loss.   Eyes: Negative for visual disturbance.  Respiratory: Negative for shortness of breath.   Cardiovascular: Negative for chest pain.  Gastrointestinal: Negative for blood in stool.  Endocrine: Negative for cold intolerance.  Genitourinary: Negative for hematuria.  Musculoskeletal: Positive for back pain.  Skin: Negative for rash.  Allergic/Immunologic: Negative for environmental allergies.  Neurological: Negative for numbness.  Hematological: Bruises/bleeds easily.  Psychiatric/Behavioral: Positive for dysphoric mood.       Objective:   Physical Exam VS: see vs page GEN: no distress HEAD: head: no deformity eyes: no periorbital swelling, no proptosis external nose and ears are normal mouth: no lesion seen NECK: supple, thyroid is not enlarged CHEST WALL: no deformity LUNGS:  Clear to auscultation BREASTS:  sees gyn CV: reg rate and rhythm, no murmur ABD: abdomen is soft, nontender.  no hepatosplenomegaly.  not distended.  no hernia GENITALIA/RECTAL: sees  gyn MUSCULOSKELETAL: muscle bulk and strength are grossly normal.  no obvious joint swelling.  gait is normal and steady PULSES:  no carotid bruit NEURO:  cn 2-12 grossly intact.   readily moves all 4's.   SKIN:  Normal texture and temperature.  No rash or suspicious lesion is visible.   NODES:  None palpable at the neck PSYCH: alert, well-oriented.  Does not appear anxious nor depressed.        Assessment & Plan:  Wellness visit today, with problems stable, except as noted. we discussed code status.  pt requests full code, but would not want to be started or maintained on artificial life-support measures if there was not a reasonable chance of recovery.   SEPARATE EVALUATION FOLLOWS--EACH PROBLEM HERE IS NEW, NOT RESPONDING TO TREATMENT, OR POSES SIGNIFICANT RISK TO THE PATIENT'S HEALTH: HISTORY OF THE PRESENT ILLNESS: She returns for f/u of type 2 DM (dx'ed 1987, on a routine blood test; no known complications; therapy has been limited by severe noncompliance and dementia; she was admitted in march of 2014, with severe hyperglycemia; husband checks cbg's and gives pt insulin injections).  she brings a record of her cbg's which i have reviewed today.  It varies from 85-300.  It is highest at lunch, and lowest before supper. PAST MEDICAL HISTORY reviewed and up to date today REVIEW OF SYSTEMS: She has tearing of the eyes, but no assoc eye sxs.  denies hypoglycemia PHYSICAL EXAMINATION: VITAL SIGNS:  See vs page GENERAL: no distress Eyes: normal externally LAB/XRAY RESULTS: Lab Results  Component Value Date   CHOL 244* 07/05/2013   HDL 89.20 07/05/2013   LDLCALC 136* 04/05/2012   LDLDIRECT 141.5 07/05/2013   TRIG 85.0 07/05/2013   CHOLHDL 3 07/05/2013   Lab Results  Component Value Date   HGBA1C 10.3* 07/05/2013  IMPRESSION: Dyslipidemia: she needs increased rx DM: This insulin regimen was chosen from multiple options, for its simplicity.  The benefits of glycemic control  must be weighed against the risks of hypoglycemia.  She needs increased rx. Tearing of the eyes, new. PLAN: See instruction page

## 2013-07-05 NOTE — Patient Instructions (Addendum)
please consider these measures for your health:  minimize alcohol.  do not use tobacco products.  have a colonoscopy at least every 10 years from age 69.  Women should have an annual mammogram from age 2.  keep firearms safely stored.  always use seat belts.  have working smoke alarms in your home.  see an eye doctor and dentist regularly.  never drive under the influence of alcohol or drugs (including prescription drugs).   please let me know what your wishes would be, if artificial life support measures should become necessary.  it is critically important to prevent falling down (keep floor areas well-lit, dry, and free of loose objects.  If you have a cane, walker, or wheelchair, you should use it, even for short trips around the house.  Also, try not to rush).  blood tests are being requested for you today.  We'll contact you with results. Please come back for a follow-up appointment in 3 months.  [phone] to make an appointment for a mammogram.   You should have a vaccine against shingles (a painful rash which results from the  chickenpox infection which most people had many years ago).  This vaccine reduces, but does not totally eliminate the risk of shingles.  Because this is a medicare part d benefit, you should get it at a pharmacy.   Please increase the insulin to 30 units daily, with breakfast.  On this type of insulin schedule, you should eat meals on a regular schedule.  If a meal is missed or significantly delayed, your blood sugar could go low.

## 2013-07-06 ENCOUNTER — Other Ambulatory Visit: Payer: Self-pay | Admitting: Endocrinology

## 2013-07-06 LAB — MICROALBUMIN / CREATININE URINE RATIO
Creatinine,U: 33.9 mg/dL
Microalb, Ur: 0.5 mg/dL (ref 0.0–1.9)

## 2013-07-06 MED ORDER — ATORVASTATIN CALCIUM 20 MG PO TABS: 20.0000 mg | ORAL_TABLET | Freq: Every day | ORAL | Status: DC

## 2013-08-03 ENCOUNTER — Encounter: Payer: Self-pay | Admitting: Cardiovascular Disease

## 2013-08-03 ENCOUNTER — Ambulatory Visit (INDEPENDENT_AMBULATORY_CARE_PROVIDER_SITE_OTHER): Payer: Medicare PPO | Admitting: Cardiovascular Disease

## 2013-08-03 VITALS — BP 122/64 | HR 84 | Ht 63.0 in | Wt 161.0 lb

## 2013-08-03 DIAGNOSIS — I2589 Other forms of chronic ischemic heart disease: Secondary | ICD-10-CM

## 2013-08-03 DIAGNOSIS — I5022 Chronic systolic (congestive) heart failure: Secondary | ICD-10-CM

## 2013-08-03 DIAGNOSIS — E785 Hyperlipidemia, unspecified: Secondary | ICD-10-CM

## 2013-08-03 DIAGNOSIS — I255 Ischemic cardiomyopathy: Secondary | ICD-10-CM

## 2013-08-03 DIAGNOSIS — I509 Heart failure, unspecified: Secondary | ICD-10-CM

## 2013-08-03 NOTE — Progress Notes (Signed)
History of Present Illness: 68 yo female with history of cardiomyopathy, HTN, DM, HLD, anxiety/depression here today for cardiac follow up. She was admitted to [facility] August 2014 with lower extremity edema, dyspnea on exertion. Echo 03/09/13 with LVEF 25-30%. No significant valve disease. We discussed cardiac cath to exclude CAD but she was very agitated at times requiring a sitter (presumed to be from baseline dementia). She was combative with nursing staff. Stress myoview 03/14/13 was intermediate risk myoview with moderate anteroapical wall MI and moderate peri infarct ischemia. EF severely reduced at 33%. Given her dementia, she was managed conservatively. She was diuresed well during admission and discharged home on Lasix 40 mg po BID. This was increased to 80mg  am, 40 mg pm at last visit. She was started on a statin in primary care. Last lipids 07/05/13 as below.   She is here today for follow up. She has no complaints. No chest pain or SOB. No LE edema.   Primary Care Physician: Romero Bellingllison, Sean  Last Lipid Profile:Lipid Panel     Component Value Date/Time   CHOL 244* 07/05/2013 1344   TRIG 85.0 07/05/2013 1344   HDL 89.20 07/05/2013 1344   CHOLHDL 3 07/05/2013 1344   VLDL 17.0 07/05/2013 1344   LDLCALC 136* 04/05/2012 0930     Past Medical History  Diagnosis Date  . ALLERGIC RHINITIS 02/08/2007  . ANXIETY 02/08/2007  . Cough 08/15/2008  . DEPRESSION 02/08/2007  . DIABETES MELLITUS, TYPE I 02/08/2007  . Headache(784.0) 11/06/2008  . HYPERCHOLESTEROLEMIA 11/05/2008  . HYPERTENSION 02/08/2007  . URINARY INCONTINENCE 02/08/2007  . Dyslipidemia   . Non-ulcer dyspepsia   . Leukopenia   . DM retinopathy     background  . 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 Dementia     Past Surgical History  Procedure Laterality Date  . Total knee arthroplasty  1990    left  . Panendoscopy  10/27/1999  . Stress cardiolite  12/14/2003  . Electrocardiogram  05/17/2006  . Abdominal  hysterectomy      Current Outpatient Prescriptions  Medication Sig Dispense Refill  . Alcohol Swabs (ALCOHOL PREPS) PADS       . aspirin EC 325 MG tablet TAKE 1 TABLET (325 MG TOTAL) BY MOUTH DAILY.  30 tablet  6  . atorvastatin (LIPITOR) 20 MG tablet Take 1 tablet (20 mg total) by mouth daily.  90 tablet  3  . carvedilol (COREG) 3.125 MG tablet TAKE 1 TABLET (3.125 MG TOTAL) BY MOUTH 2 (TWO) TIMES DAILY WITH A MEAL.  60 tablet  1  . citalopram (CELEXA) 20 MG tablet TAKE 1 TABLET (20 MG TOTAL) BY MOUTH DAILY.  30 tablet  3  . divalproex (DEPAKOTE SPRINKLE) 125 MG capsule Take 1 capsule (125 mg total) by mouth every 12 (twelve) hours.  60 capsule  3  . furosemide (LASIX) 40 MG tablet Take 2 tablets by mouth every morning and 1 tablet every afternoon  90 tablet  6  . Insulin Lispro Prot & Lispro (50-50) 100 UNIT/ML SUPN Inject 30 Units into the skin daily with breakfast. And pen needles 1/day      . lisinopril (PRINIVIL,ZESTRIL) 2.5 MG tablet TAKE 1 TABLET (2.5 MG TOTAL) BY MOUTH DAILY.  30 tablet  6  . OLANZapine (ZYPREXA) 2.5 MG tablet Take 1 tablet (2.5 mg total) by mouth at bedtime.  30 tablet  5  . potassium chloride SA (K-DUR,KLOR-CON) 20 MEQ tablet Take 1 tablet (20  mEq total) by mouth daily.  30 tablet  6   No current facility-administered medications for this visit.    Allergies  Allergen Reactions  . Aspirin Nausea Only  . Codeine Nausea Only    History   Social History  . Marital Status: Married    Spouse Name: N/A    Number of Children: N/A  . Years of Education: N/A   Occupational History  . Retired    Social History Main Topics  . Smoking status: Never Smoker   . Smokeless tobacco: Never Used  . Alcohol Use: No  . Drug Use: No  . Sexual Activity: No   Other Topics Concern  . Not on file   Social History Narrative  . No narrative on file    Family History  Problem Relation Age of Onset  . Alcohol abuse Son   . Cancer Neg Hx     no cancer in immediate  family  . CAD Father     Review of Systems:  As stated in the HPI and otherwise negative.   BP 122/64  Pulse 84  Ht 5\' 3"  (1.6 m)  Wt 161 lb (73.029 kg)  BMI 28.53 kg/m2  Physical Examination: General: Well developed, well nourished, NAD HEENT: OP clear, mucus membranes moist SKIN: warm, dry. No rashes. Neuro: No focal deficits Musculoskeletal: Muscle strength 5/5 all ext Psychiatric: Mood and affect normal Neck: No JVD, no carotid bruits, no thyromegaly, no lymphadenopathy. Lungs:Clear bilaterally, no wheezes, rhonci, crackles Cardiovascular: Regular rate and rhythm. No murmurs, gallops or rubs. Abdomen:Soft. Bowel sounds present. Non-tender.  Extremities: No lower extremity edema. Pulses are 2 + in the bilateral DP/PT.  EKG: NSR, rate 77 bpm. Old septal infarct. Lateral T wave inversions.   Echo 03/09/13: Left ventricle: The cavity size was normal. Wall thickness was normal. Systolic function was severely reduced. The estimated ejection fraction was in the range of 25% to 30%. There is akinesis of the anteroseptal and apical myocardium. There is hypokinesis of the inferior myocardium. Doppler parameters are consistent with abnormal left ventricular relaxation (grade 1 diastolic dysfunction). - Mitral valve: Calcified annulus. Mildly thickened leaflets - Left atrium: The atrium was mildly dilated. - Pericardium, extracardiac: A trivial pericardial effusion  Assessment and Plan:   1. Ischemic Cardiomyopathy: Pt with new diagnosis of cardiomyopathy in hospital August 2014, LVEF=25-30%. Stress myoview with possible ischemia. She has no chest pain. She is presumed to have CAD but given her profound dementia as witnessed in the hospital and lack of chest pain, we have elected for medical management only at this time. She is doing well with conservative therapy. Continue ASA, statin, beta blocker, Ace-inh. No recent chest pains. If she has occurrence of chest pains, will consider  cath.   2. Acute on chronic systolic CHF:  No LE edema. Weight stable and followed closely daily by her husband. Continue Lasix to 80 mg po QAM, 40 mg po QPM. Follow weights closely at home.   3. Hyperlipidemia: On a statin. Followed by Dr. Everardo All

## 2013-08-03 NOTE — Patient Instructions (Signed)
Your physician wants you to follow-up in:  6 months. You will receive a reminder letter in the mail two months in advance. If you don't receive a letter, please call our office to schedule the follow-up appointment.   

## 2013-08-09 ENCOUNTER — Encounter: Payer: Self-pay | Admitting: Endocrinology

## 2013-08-16 ENCOUNTER — Other Ambulatory Visit: Payer: Self-pay | Admitting: Endocrinology

## 2013-08-17 NOTE — Telephone Encounter (Signed)
Please advise if ok to refill. Thanks 

## 2013-08-28 ENCOUNTER — Other Ambulatory Visit: Payer: Self-pay | Admitting: Endocrinology

## 2013-09-27 ENCOUNTER — Encounter: Payer: Self-pay | Admitting: Endocrinology

## 2013-10-03 ENCOUNTER — Encounter: Payer: Self-pay | Admitting: Endocrinology

## 2013-10-03 ENCOUNTER — Ambulatory Visit (INDEPENDENT_AMBULATORY_CARE_PROVIDER_SITE_OTHER): Payer: Medicare PPO | Admitting: Endocrinology

## 2013-10-03 VITALS — BP 132/82 | HR 87 | Temp 98.9°F | Ht 63.0 in | Wt 161.0 lb

## 2013-10-03 DIAGNOSIS — E109 Type 1 diabetes mellitus without complications: Secondary | ICD-10-CM

## 2013-10-03 DIAGNOSIS — F0391 Unspecified dementia with behavioral disturbance: Secondary | ICD-10-CM

## 2013-10-03 DIAGNOSIS — F03918 Unspecified dementia, unspecified severity, with other behavioral disturbance: Secondary | ICD-10-CM

## 2013-10-03 LAB — HEMOGLOBIN A1C
Hgb A1c MFr Bld: 9.5 % — ABNORMAL HIGH (ref <5.7)
MEAN PLASMA GLUCOSE: 226 mg/dL — AB (ref <117)

## 2013-10-03 NOTE — Patient Instructions (Addendum)
check your blood sugar twice a day.  vary the time of day when you check, between before the 3 meals, and at bedtime.  also check if you have symptoms of your blood sugar being too high or too low.  please keep a record of the readings and bring it to your next appointment here.  You can write it on any piece of paper.  please call us sooner if your blood sugar goes below 70, or if you have a lot of readings over 200.  blood tests are being requested for you today.  We'll contact you with results.   i'll do the form for the back brace.   Please come back for a follow-up appointment in 3 months.

## 2013-10-03 NOTE — Progress Notes (Signed)
Subjective:    Patient ID: Dana [name], female    DOB: 05/20/1946, 68 y.o.   MRN: [id number]  HPI She returns for f/u of type 2 DM (dx'ed 1987, on a routine blood test; she has mild if any neuropathy of the lower extremities; no known associated chronic complications; therapy has been limited by severe noncompliance and dementia; she was admitted in march of 2014, with severe hyperglycemia; husband checks cbg's and gives pt insulin injections).  no cbg record, but states cbg's vary from 75-200.  It is both highest and lowest in the afternoon.   Past Medical History  Diagnosis Date  . ALLERGIC RHINITIS 02/08/2007  . ANXIETY 02/08/2007  . Cough 08/15/2008  . DEPRESSION 02/08/2007  . DIABETES MELLITUS, TYPE I 02/08/2007  . Headache(784.0) 11/06/2008  . HYPERCHOLESTEROLEMIA 11/05/2008  . HYPERTENSION 02/08/2007  . URINARY INCONTINENCE 02/08/2007  . Dyslipidemia   . Non-ulcer dyspepsia   . Leukopenia   . DM retinopathy     background  . 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 Dementia     Past Surgical History  Procedure Laterality Date  . Total knee arthroplasty  1990    left  . Panendoscopy  10/27/1999  . Stress cardiolite  12/14/2003  . Electrocardiogram  05/17/2006  . Abdominal hysterectomy      History   Social History  . Marital Status: Married    Spouse Name: N/A    Number of Children: N/A  . Years of Education: N/A   Occupational History  . Retired    Social History Main Topics  . Smoking status: Never Smoker   . Smokeless tobacco: Never Used  . Alcohol Use: No  . Drug Use: No  . Sexual Activity: No   Other Topics Concern  . Not on file   Social History Narrative  . No narrative on file    Current Outpatient Prescriptions on File Prior to Visit  Medication Sig Dispense Refill  . Alcohol Swabs (ALCOHOL PREPS) PADS       . aspirin EC 325 MG tablet TAKE 1 TABLET (325 MG TOTAL) BY MOUTH DAILY.  30 tablet  6  . atorvastatin (LIPITOR) 20 MG tablet Take  1 tablet (20 mg total) by mouth daily.  90 tablet  3  . carvedilol (COREG) 3.125 MG tablet TAKE 1 TABLET (3.125 MG TOTAL) BY MOUTH 2 (TWO) TIMES DAILY WITH A MEAL.  60 tablet  1  . citalopram (CELEXA) 20 MG tablet TAKE 1 TABLET (20 MG TOTAL) BY MOUTH DAILY.  30 tablet  3  . divalproex (DEPAKOTE SPRINKLE) 125 MG capsule TAKE 1 CAPSULE (125 MG TOTAL) BY MOUTH EVERY 12 (TWELVE) HOURS.  60 capsule  3  . furosemide (LASIX) 40 MG tablet Take 2 tablets by mouth every morning and 1 tablet every afternoon  90 tablet  6  . Insulin Lispro Prot & Lispro (50-50) 100 UNIT/ML SUPN Inject 40 Units into the skin daily with breakfast. And pen needles 1/day      . lisinopril (PRINIVIL,ZESTRIL) 2.5 MG tablet TAKE 1 TABLET (2.5 MG TOTAL) BY MOUTH DAILY.  30 tablet  6  . OLANZapine (ZYPREXA) 2.5 MG tablet Take 1 tablet (2.5 mg total) by mouth at bedtime.  30 tablet  5  . potassium chloride SA (K-DUR,KLOR-CON) 20 MEQ tablet Take 1 tablet (20 mEq total) by mouth daily.  30 tablet  6   No current facility-administered medications on file prior to visit.  Allergies  Allergen Reactions  . Aspirin Nausea Only  . Codeine Nausea Only   Family History  Problem Relation Age of Onset  . Alcohol abuse Son   . Cancer Neg Hx     no cancer in immediate family  . CAD Father    BP 132/82  Pulse 87  Temp(Src) 98.9 F (37.2 C) (Oral)  Ht 5\' 3"  (1.6 m)  Wt 161 lb (73.029 kg)  BMI 28.53 kg/m2  SpO2 96%  Review of Systems She denies hypoglycemia and weight change.  She has chronic low-back pain.  No bowel or bladder retention.     Objective:   Physical Exam VITAL SIGNS:  See vs page GENERAL: no distress Spine: nontender. Gait slow but steady.    Lab Results  Component Value Date   HGBA1C 9.5* 10/03/2013      Assessment & Plan:  DM: This insulin regimen was chosen from multiple options, for its simplicity.  The benefits of glycemic control must be weighed against the risks of hypoglycemia.  She needs increased  rx. Dementia: this complicates the rx of DM. Low back pain, persistent: we'll try a back brace, as we'll try to avoid pain meds in the setting of dementia.

## 2013-10-04 DIAGNOSIS — F03918 Unspecified dementia, unspecified severity, with other behavioral disturbance: Secondary | ICD-10-CM | POA: Insufficient documentation

## 2013-10-04 DIAGNOSIS — F0391 Unspecified dementia with behavioral disturbance: Secondary | ICD-10-CM | POA: Insufficient documentation

## 2013-10-05 ENCOUNTER — Telehealth: Payer: Self-pay | Admitting: Endocrinology

## 2013-10-05 ENCOUNTER — Telehealth: Payer: Self-pay

## 2013-10-05 NOTE — Telephone Encounter (Signed)
Spoke with pt husband, aware of dr mcalhany's recommendations.

## 2013-10-05 NOTE — Telephone Encounter (Signed)
Rec'd from [facility]  forward 2 pages to Dr. Lorane GellEllsion

## 2013-10-05 NOTE — Telephone Encounter (Signed)
OK to cut Lasix back to once daily. Dana [name]

## 2013-10-12 ENCOUNTER — Other Ambulatory Visit: Payer: Self-pay | Admitting: Endocrinology

## 2013-10-18 ENCOUNTER — Telehealth: Payer: Self-pay | Admitting: Endocrinology

## 2013-10-18 NOTE — Telephone Encounter (Signed)
See below and please advise, Thanks!  

## 2013-10-18 NOTE — Telephone Encounter (Signed)
Ok, please reduce the insulin to 35 units qam

## 2013-10-18 NOTE — Telephone Encounter (Signed)
Pt has had 6 episodes of blood sugar dropping below 100 in the last 6 days

## 2013-10-19 NOTE — Telephone Encounter (Signed)
Noted, pt informed.

## 2013-10-29 ENCOUNTER — Other Ambulatory Visit (HOSPITAL_COMMUNITY): Payer: Self-pay | Admitting: Cardiovascular Disease

## 2013-11-01 ENCOUNTER — Other Ambulatory Visit: Payer: Self-pay | Admitting: Endocrinology

## 2013-11-16 ENCOUNTER — Encounter (INDEPENDENT_AMBULATORY_CARE_PROVIDER_SITE_OTHER): Payer: Commercial Managed Care - HMO | Admitting: Ophthalmology

## 2013-11-16 DIAGNOSIS — E1165 Type 2 diabetes mellitus with hyperglycemia: Secondary | ICD-10-CM

## 2013-11-16 DIAGNOSIS — I10 Essential (primary) hypertension: Secondary | ICD-10-CM

## 2013-11-16 DIAGNOSIS — H43819 Vitreous degeneration, unspecified eye: Secondary | ICD-10-CM

## 2013-11-16 DIAGNOSIS — E1139 Type 2 diabetes mellitus with other diabetic ophthalmic complication: Secondary | ICD-10-CM

## 2013-11-16 DIAGNOSIS — E11319 Type 2 diabetes mellitus with unspecified diabetic retinopathy without macular edema: Secondary | ICD-10-CM

## 2013-11-16 DIAGNOSIS — E11311 Type 2 diabetes mellitus with unspecified diabetic retinopathy with macular edema: Secondary | ICD-10-CM

## 2013-11-16 DIAGNOSIS — H35039 Hypertensive retinopathy, unspecified eye: Secondary | ICD-10-CM

## 2013-12-12 ENCOUNTER — Telehealth: Payer: Self-pay

## 2013-12-12 NOTE — Telephone Encounter (Signed)
i do not prescribe this med

## 2013-12-12 NOTE — Telephone Encounter (Signed)
Please advise if ok to refill Depakote 125mg . Pt was last seen on 10/03/2013 and medication was last filled on 08/17/2013.  Thanks!

## 2013-12-12 NOTE — Telephone Encounter (Signed)
Pharmacy notified.

## 2013-12-14 ENCOUNTER — Encounter (INDEPENDENT_AMBULATORY_CARE_PROVIDER_SITE_OTHER): Payer: Commercial Managed Care - HMO | Admitting: Ophthalmology

## 2013-12-14 ENCOUNTER — Encounter (HOSPITAL_COMMUNITY): Payer: Self-pay | Admitting: Emergency Medicine

## 2013-12-14 DIAGNOSIS — E785 Hyperlipidemia, unspecified: Secondary | ICD-10-CM | POA: Insufficient documentation

## 2013-12-14 DIAGNOSIS — E11319 Type 2 diabetes mellitus with unspecified diabetic retinopathy without macular edema: Secondary | ICD-10-CM | POA: Insufficient documentation

## 2013-12-14 DIAGNOSIS — F329 Major depressive disorder, single episode, unspecified: Secondary | ICD-10-CM | POA: Insufficient documentation

## 2013-12-14 DIAGNOSIS — Z8739 Personal history of other diseases of the musculoskeletal system and connective tissue: Secondary | ICD-10-CM | POA: Insufficient documentation

## 2013-12-14 DIAGNOSIS — Z862 Personal history of diseases of the blood and blood-forming organs and certain disorders involving the immune mechanism: Secondary | ICD-10-CM | POA: Insufficient documentation

## 2013-12-14 DIAGNOSIS — R079 Chest pain, unspecified: Secondary | ICD-10-CM | POA: Insufficient documentation

## 2013-12-14 DIAGNOSIS — H43819 Vitreous degeneration, unspecified eye: Secondary | ICD-10-CM

## 2013-12-14 DIAGNOSIS — R5383 Other fatigue: Secondary | ICD-10-CM

## 2013-12-14 DIAGNOSIS — E7800 Pure hypercholesterolemia, unspecified: Secondary | ICD-10-CM | POA: Insufficient documentation

## 2013-12-14 DIAGNOSIS — R5381 Other malaise: Secondary | ICD-10-CM | POA: Insufficient documentation

## 2013-12-14 DIAGNOSIS — Z79899 Other long term (current) drug therapy: Secondary | ICD-10-CM | POA: Insufficient documentation

## 2013-12-14 DIAGNOSIS — E1039 Type 1 diabetes mellitus with other diabetic ophthalmic complication: Secondary | ICD-10-CM | POA: Insufficient documentation

## 2013-12-14 DIAGNOSIS — I10 Essential (primary) hypertension: Secondary | ICD-10-CM | POA: Insufficient documentation

## 2013-12-14 DIAGNOSIS — E1165 Type 2 diabetes mellitus with hyperglycemia: Secondary | ICD-10-CM

## 2013-12-14 DIAGNOSIS — Z8709 Personal history of other diseases of the respiratory system: Secondary | ICD-10-CM | POA: Insufficient documentation

## 2013-12-14 DIAGNOSIS — F3289 Other specified depressive episodes: Secondary | ICD-10-CM | POA: Insufficient documentation

## 2013-12-14 DIAGNOSIS — E1139 Type 2 diabetes mellitus with other diabetic ophthalmic complication: Secondary | ICD-10-CM

## 2013-12-14 DIAGNOSIS — F411 Generalized anxiety disorder: Secondary | ICD-10-CM | POA: Insufficient documentation

## 2013-12-14 DIAGNOSIS — R51 Headache: Secondary | ICD-10-CM | POA: Insufficient documentation

## 2013-12-14 DIAGNOSIS — F0390 Unspecified dementia without behavioral disturbance: Secondary | ICD-10-CM | POA: Insufficient documentation

## 2013-12-14 DIAGNOSIS — E11311 Type 2 diabetes mellitus with unspecified diabetic retinopathy with macular edema: Secondary | ICD-10-CM

## 2013-12-14 LAB — BASIC METABOLIC PANEL
BUN: 26 mg/dL — AB (ref 6–23)
CALCIUM: 9.5 mg/dL (ref 8.4–10.5)
CO2: 30 meq/L (ref 19–32)
Chloride: 99 mEq/L (ref 96–112)
Creatinine, Ser: 0.84 mg/dL (ref 0.50–1.10)
GFR calc Af Amer: 80 mL/min — ABNORMAL LOW (ref >90)
GFR, EST NON AFRICAN AMERICAN: 69 mL/min — AB (ref >90)
Glucose, Bld: 313 mg/dL — ABNORMAL HIGH (ref 70–99)
Potassium: 4.4 mEq/L (ref 3.7–5.3)
Sodium: 140 mEq/L (ref 137–147)

## 2013-12-14 LAB — CBC
HEMATOCRIT: 34.4 % — AB (ref 36.0–46.0)
HEMOGLOBIN: 11.4 g/dL — AB (ref 12.0–15.0)
MCH: 28.9 pg (ref 26.0–34.0)
MCHC: 33.1 g/dL (ref 30.0–36.0)
MCV: 87.3 fL (ref 78.0–100.0)
Platelets: 232 10*3/uL (ref 150–400)
RBC: 3.94 MIL/uL (ref 3.87–5.11)
RDW: 14.9 % (ref 11.5–15.5)
WBC: 5.7 10*3/uL (ref 4.0–10.5)

## 2013-12-14 NOTE — ED Notes (Signed)
Patient arrives from home with complaint of chest pain starting today. States that pain has been intermittent for "a while".

## 2013-12-15 ENCOUNTER — Emergency Department (HOSPITAL_COMMUNITY): Payer: Medicare PPO

## 2013-12-15 ENCOUNTER — Encounter (HOSPITAL_COMMUNITY): Payer: Self-pay | Admitting: Emergency Medicine

## 2013-12-15 ENCOUNTER — Emergency Department (HOSPITAL_COMMUNITY)
Admission: EM | Admit: 2013-12-15 | Discharge: 2013-12-15 | Disposition: A | Discharge status: Home / Self Care | Payer: Medicare PPO | Attending: Emergency Medicine | Admitting: Emergency Medicine

## 2013-12-15 DIAGNOSIS — R079 Chest pain, unspecified: Secondary | ICD-10-CM

## 2013-12-15 DIAGNOSIS — R531 Weakness: Secondary | ICD-10-CM

## 2013-12-15 DIAGNOSIS — R51 Headache: Secondary | ICD-10-CM

## 2013-12-15 DIAGNOSIS — R519 Headache, unspecified: Secondary | ICD-10-CM

## 2013-12-15 HISTORY — DX: Heart failure, unspecified: I50.9

## 2013-12-15 LAB — I-STAT TROPONIN, ED: Troponin i, poc: 0 ng/mL (ref 0.00–0.08)

## 2013-12-15 LAB — TROPONIN I: Troponin I: <0.3 ng/mL (ref <0.30)

## 2013-12-15 MED ORDER — INSULIN ASPART 100 UNIT/ML ~~LOC~~ SOLN
8.0000 [IU] | Freq: Once | SUBCUTANEOUS | Status: AC
  Administered 2013-12-15: 8 [IU] via SUBCUTANEOUS
  Filled 2013-12-15: qty 1

## 2013-12-15 NOTE — ED Provider Notes (Signed)
CSN: [id number]     Arrival date & time 12/14/13  2247 History   First MD Initiated Contact with Patient 12/15/13 0013     Chief Complaint  Patient presents with  . Chest Pain     (Consider location/radiation/quality/duration/timing/severity/associated sxs/prior Treatment) HPI 68 year old female presents to the emergency department from home with complaints of chest pain, headache, generalized weakness.  Patient has dementia and is a poor historian.  Husband reports that he brought her in as she has had worsening weakness ongoing for some time, today complained of severe headache and chest pain.  Patient describes pain as a pressure.  She denies any shortness of breath.  No diaphoresis, no nausea.  She has not had chest pain like this before.  Husband reports patient did have episode of diaphoresis today associated with hypoglycemia.  He reports that she does not complain of chest pain.  She will time to time complaint of headache, but reports that they seemed worse.  No focal weakness or neurologic deficits.  Patient has history of diabetes, hypertension, elevated cholesterol, cardiomyopathy. Past Medical History  Diagnosis Date  . ALLERGIC RHINITIS 02/08/2007  . ANXIETY 02/08/2007  . Cough 08/15/2008  . DEPRESSION 02/08/2007  . DIABETES MELLITUS, TYPE I 02/08/2007  . Headache(784.0) 11/06/2008  . HYPERCHOLESTEROLEMIA 11/05/2008  . HYPERTENSION 02/08/2007  . URINARY INCONTINENCE 02/08/2007  . Dyslipidemia   . Non-ulcer dyspepsia   . Leukopenia   . DM retinopathy     background  . 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 Dementia    Past Surgical History  Procedure Laterality Date  . Total knee arthroplasty  1990    left  . Panendoscopy  10/27/1999  . Stress cardiolite  12/14/2003  . Electrocardiogram  05/17/2006  . Abdominal hysterectomy     Family History  Problem Relation Age of Onset  . Alcohol abuse Son   . Cancer Neg Hx     no cancer in immediate family  . CAD Father     History  Substance Use Topics  . Smoking status: Never Smoker   . Smokeless tobacco: Never Used  . Alcohol Use: No   OB History   Grav Para Term Preterm Abortions TAB SAB Ect Mult Living                 Review of Systems  Unable to perform ROS: Dementia      Allergies  Aspirin and Codeine  Home Medications   Prior to Admission medications   Medication Sig Start Date End Date Taking? Authorizing Provider  atorvastatin (LIPITOR) 40 MG tablet Take 40 mg by mouth daily.   Yes Historical Provider, MD  carvedilol (COREG) 3.125 MG tablet Take 3.125 mg by mouth 2 (two) times daily with a meal.   Yes Historical Provider, MD  citalopram (CELEXA) 20 MG tablet Take 20 mg by mouth daily.   Yes Historical Provider, MD  divalproex (DEPAKOTE SPRINKLE) 125 MG capsule Take 125 mg by mouth 2 (two) times daily.   Yes Historical Provider, MD  furosemide (LASIX) 40 MG tablet Take 2 tablets by mouth every morning and 1 tablet every afternoon 03/27/13  Yes Kathleene Hazel, MD  Insulin Lispro Prot & Lispro (50-50) 100 UNIT/ML SUPN Inject 40 Units into the skin daily with breakfast. And pen needles 1/day 05/19/13  Yes Romero Belling, MD  lisinopril (PRINIVIL,ZESTRIL) 2.5 MG tablet Take 2.5 mg by mouth daily.   Yes Historical Provider, MD  OLANZapine [facility])  2.5 MG tablet Take 2.5 mg by mouth at bedtime.   Yes Historical Provider, MD  potassium chloride SA (K-DUR,KLOR-CON) 20 MEQ tablet Take 20 mEq by mouth daily.   Yes Historical Provider, MD   BP 154/69  Resp 18  Ht 5\' 3"  (1.6 m)  Wt 173 lb (78.472 kg)  BMI 30.65 kg/m2  SpO2 98% Physical Exam  Nursing note and vitals reviewed. Constitutional: She appears well-developed and well-nourished. No distress.  HENT:  Head: Normocephalic and atraumatic.  Right Ear: External ear normal.  Left Ear: External ear normal.  Nose: Nose normal.  Mouth/Throat: Oropharynx is clear and moist.  Eyes: Conjunctivae and EOM are normal. Pupils are equal,  round, and reactive to light.  Neck: Normal range of motion. Neck supple. No JVD present. No tracheal deviation present. No thyromegaly present.  Cardiovascular: Normal rate, regular rhythm, normal heart sounds and intact distal pulses.  Exam reveals no gallop and no friction rub.   Pulmonary/Chest: Effort normal and breath sounds normal. No stridor. No respiratory distress. She has no wheezes. She has no rales. She exhibits no tenderness.  Abdominal: Soft. Bowel sounds are normal. She exhibits no distension and no mass. There is no tenderness. There is no rebound and no guarding.  Musculoskeletal: Normal range of motion. She exhibits no edema and no tenderness.  Lymphadenopathy:    She has no cervical adenopathy.  Neurological: She is alert. She exhibits normal muscle tone. Coordination normal.  The patient is oriented to self and that she is in the hospital  Skin: Skin is warm and dry. No rash noted. No erythema. No pallor.  Psychiatric: She has a normal mood and affect. Her behavior is normal. Judgment and thought content normal.    ED Course  Procedures (including critical care time) Labs Review Labs Reviewed  CBC - Abnormal; Notable for the following:    Hemoglobin 11.4 (*)    HCT 34.4 (*)    All other components within normal limits  BASIC METABOLIC PANEL - Abnormal; Notable for the following:    Glucose, Bld 313 (*)    BUN 26 (*)    GFR calc non Af Amer 69 (*)    GFR calc Af Amer 80 (*)    All other components within normal limits  TROPONIN I  I-STAT TROPOININ, ED    Imaging Review Dg Chest 2 View  12/15/2013   CLINICAL DATA:  Chest pain, dizziness, history diabetes, hypertension  EXAM: CHEST  2 VIEW  COMPARISON:  03/07/2013  FINDINGS: Enlargement of cardiac silhouette.  Atherosclerotic calcification.  Mediastinal contours and pulmonary vascularity normal.  Lungs clear.  No pleural effusion or pneumothorax.  Coronary arterial calcification noted.  Bones demineralized.   IMPRESSION: Enlargement of cardiac silhouette.  No acute abnormalities.  Pulmonary edema and pleural effusions seen on previous exam resolved.   Electronically Signed   By: Ulyses Southward M.D.   On: 12/15/2013 02:10   Ct Head Wo Contrast  12/15/2013   CLINICAL DATA:  Headache.  EXAM: CT HEAD WITHOUT CONTRAST  TECHNIQUE: Contiguous axial images were obtained from the base of the skull through the vertex without intravenous contrast.  COMPARISON:  CT of the head performed 04/05/2012  FINDINGS: There is no evidence of acute infarction, mass lesion, or intra- or extra-axial hemorrhage on CT.  Minimal periventricular white matter change likely reflects small vessel ischemic microangiopathy.  The posterior fossa, including the cerebellum, brainstem and fourth ventricle, is within normal limits. The third and lateral ventricles, and  basal ganglia are unremarkable in appearance. The cerebral hemispheres are symmetric in appearance, with normal gray-white differentiation. No mass effect or midline shift is seen.  There is no evidence of fracture; visualized osseous structures are unremarkable in appearance. The orbits are within normal limits. The paranasal sinuses and mastoid air cells are well-aerated. No significant soft tissue abnormalities are seen.  IMPRESSION: 1. No acute intracranial pathology seen on CT. 2. Mild small vessel ischemic microangiopathy.   Electronically Signed   By: Roanna RaiderJeffery  Chang M.D.   On: 12/15/2013 02:24     EKG Interpretation   Date/Time:  Thursday Dec 14 2013 22:56:12 EDT Ventricular Rate:  81 PR Interval:  192 QRS Duration: 102 QT Interval:  392 QTC Calculation: 455 R Axis:   -5 Text Interpretation:  Normal sinus rhythm Anterior infarct , age  undetermined Abnormal ECG Confirmed by Kayhan Boardley  MD, Bexleigh Theriault ([id number]) on  12/15/2013 12:19:16 AM      MDM   Final diagnoses:  Chest pain  Headache  Weakness generalized    68 year old female with multiple medical problems who complains  of headache and chest pain.  EKG without changes.  Records reviewed, patient had abnormal Myoview done within the last year, and plan was for her catheterization but due to her dementia it was decided to medical management.  There is a note saying that they would like her if she returns with chest pain.  She had a brief less than 15 minute episode of chest pain headache, chest pain free with negative troponin.  Plan for chest x-ray and CAT scan of head given new changed headache today.  We'll touch base with cardiology.  Patient has had negative workup in the emergency department today.  Discussed case with on-call cardiology, who will have the clinic contact her for a followup appointment.    Olivia Mackielga M Jahliyah Trice, MD 12/15/13 [phone]

## 2013-12-15 NOTE — Discharge Instructions (Signed)
Your workup today did not show any acute changes.  Please follow up with your doctor for recheck in 3-5 days.  Return to the ER for worsening condition or new concerning symptoms.    Chest Pain Observation It is often hard to give a specific diagnosis for the cause of chest pain. Among other possibilities your symptoms might be caused by inadequate oxygen delivery to your heart (angina). Angina that is not treated or evaluated can lead to a heart attack (myocardial infarction) or death. Blood tests, electrocardiograms, and X-rays may have been done to help determine a possible cause of your chest pain. After evaluation and observation, your health care provider has determined that it is unlikely your pain was caused by an unstable condition that requires hospitalization. However, a full evaluation of your pain may need to be completed, with additional diagnostic testing as directed. It is very important to keep your follow-up appointments. Not keeping your follow-up appointments could result in permanent heart damage, disability, or death. If there is any problem keeping your follow-up appointments, you must call your health care provider. HOME CARE INSTRUCTIONS  Due to the slight chance that your pain could be angina, it is important to follow your health care provider's treatment plan and also maintain a healthy lifestyle:  Maintain or work toward achieving a healthy weight.  Stay physically active and exercise regularly.  Decrease your salt intake.  Eat a balanced, healthy diet. Talk to a dietician to learn about heart healthy foods.  Increase your fiber intake by including whole grains, vegetables, fruits, and nuts in your diet.  Avoid situations that cause stress, anger, or depression.  Take medicines as advised by your health care provider. Report any side effects to your health care provider. Do not stop medicines or adjust the dosages on your own.  Quit smoking. Do not use nicotine  patches or gum until you check with your health care provider.  Keep your blood pressure, blood sugar, and cholesterol levels within normal limits.  Limit alcohol intake to no more than 1 drink per day for women that are not pregnant and 2 drinks per day for men.  Do not abuse drugs. SEEK IMMEDIATE MEDICAL CARE IF: You have severe chest pain or pressure which may include symptoms such as:  You feel pain or pressure in you arms, neck, jaw, or back.  You have severe back or abdominal pain, feel sick to your stomach (nauseous), or throw up (vomit).  You are sweating profusely.  You are having a fast or irregular heartbeat.  You feel short of breath while at rest.  You notice increasing shortness of breath during rest, sleep, or with activity.  You have chest pain that does not get better after rest or after taking your usual medicine.  You wake from sleep with chest pain.  You are unable to sleep because you cannot breathe.  You develop a frequent cough or you are coughing up blood.  You feel dizzy, faint, or experience extreme fatigue.  You develop severe weakness, dizziness, fainting, or chills. Any of these symptoms may represent a serious problem that is an emergency. Do not wait to see if the symptoms will go away. Call your local emergency services (911 in the [place] Do not drive yourself to the hospital. MAKE SURE YOU:  Understand these instructions.  Will watch your condition.  Will get help right away if you are not doing well or get worse. Document Released: 08/08/2010 Document Revised: 03/08/2013 Document Reviewed:  01/05/2013 [employer] Patient Information 2014 [place], [place].  Weakness Weakness is a lack of strength. It may be felt all over the body (generalized) or in one specific part of the body (focal). Some causes of weakness can be serious. You may need further medical evaluation, especially if you are elderly or you have a history of immunosuppression (such  as chemotherapy or HIV), kidney disease, heart disease, or diabetes. CAUSES  Weakness can be caused by many different things, including:  Infection.  Physical exhaustion.  Internal bleeding or other blood loss that results in a lack of red blood cells (anemia).  Dehydration. This cause is more common in elderly people.  Side effects or electrolyte abnormalities from medicines, such as pain medicines or sedatives.  Emotional distress, anxiety, or depression.  Circulation problems, especially severe peripheral arterial disease.  Heart disease, such as rapid atrial fibrillation, bradycardia, or heart failure.  Nervous system disorders, such as Guillain-Barr syndrome, multiple sclerosis, or stroke. DIAGNOSIS  To find the cause of your weakness, your caregiver will take your history and perform a physical exam. Lab tests or X-rays may also be ordered, if needed. TREATMENT  Treatment of weakness depends on the cause of your symptoms and can vary greatly. HOME CARE INSTRUCTIONS   Rest as needed.  Eat a well-balanced diet.  Try to get some exercise every day.  Only take over-the-counter or prescription medicines as directed by your caregiver. SEEK MEDICAL CARE IF:   Your weakness seems to be getting worse or spreads to other parts of your body.  You develop new aches or pains. SEEK IMMEDIATE MEDICAL CARE IF:   You cannot perform your normal daily activities, such as getting dressed and feeding yourself.  You cannot walk up and down stairs, or you feel exhausted when you do so.  You have shortness of breath or chest pain.  You have difficulty moving parts of your body.  You have weakness in only one area of the body or on only one side of the body.  You have a fever.  You have trouble speaking or swallowing.  You cannot control your bladder or bowel movements.  You have black or bloody vomit or stools. MAKE SURE YOU:  Understand these instructions.  Will watch  your condition.  Will get help right away if you are not doing well or get worse. Document Released: 07/06/2005 Document Revised: 01/05/2012 Document Reviewed: 09/04/2011 [facility] Patient Information 2014 [place], [place].  Headaches, Frequently Asked Questions MIGRAINE HEADACHES Q: What is migraine? What causes it? How can I treat it? A: Generally, migraine headaches begin as a dull ache. Then they develop into a constant, throbbing, and pulsating pain. You may experience pain at the temples. You may experience pain at the front or back of one or both sides of the head. The pain is usually accompanied by a combination of:  Nausea.  Vomiting.  Sensitivity to light and noise. Some people (about 15%) experience an aura (see below) before an attack. The cause of migraine is believed to be chemical reactions in the brain. Treatment for migraine may include over-the-counter or prescription medications. It may also include self-help techniques. These include relaxation training and biofeedback.  Q: What is an aura? A: About 15% of people with migraine get an "aura". This is a sign of neurological symptoms that occur before a migraine headache. You may see wavy or jagged lines, dots, or flashing lights. You might experience tunnel vision or blind spots in one or both eyes. The  aura can include visual or auditory hallucinations (something imagined). It may include disruptions in smell (such as strange odors), taste or touch. Other symptoms include:  Numbness.  A "pins and needles" sensation.  Difficulty in recalling or speaking the correct word. These neurological events may last as long as 60 minutes. These symptoms will fade as the headache begins. Q: What is a trigger? A: Certain physical or environmental factors can lead to or "trigger" a migraine. These include:  Foods.  Hormonal changes.  Weather.  Stress. It is important to remember that triggers are different for everyone. To help  prevent migraine attacks, you need to figure out which triggers affect you. Keep a headache diary. This is a good way to track triggers. The diary will help you talk to your healthcare professional about your condition. Q: Does weather affect migraines? A: Bright sunshine, hot, humid conditions, and drastic changes in barometric pressure may lead to, or "trigger," a migraine attack in some people. But studies have shown that weather does not act as a trigger for everyone with migraines. Q: What is the link between migraine and hormones? A: Hormones start and regulate many of your body's functions. Hormones keep your body in balance within a constantly changing environment. The levels of hormones in your body are unbalanced at times. Examples are during menstruation, pregnancy, or menopause. That can lead to a migraine attack. In fact, about three quarters of all women with migraine report that their attacks are related to the menstrual cycle.  Q: Is there an increased risk of stroke for migraine sufferers? A: The likelihood of a migraine attack causing a stroke is very remote. That is not to say that migraine sufferers cannot have a stroke associated with their migraines. In persons under age 9, the most common associated factor for stroke is migraine headache. But over the course of a person's normal life span, the occurrence of migraine headache may actually be associated with a reduced risk of dying from cerebrovascular disease due to stroke.  Q: What are acute medications for migraine? A: Acute medications are used to treat the pain of the headache after it has started. Examples over-the-counter medications, NSAIDs, ergots, and triptans.  Q: What are the triptans? A: Triptans are the newest class of abortive medications. They are specifically targeted to treat migraine. Triptans are vasoconstrictors. They moderate some chemical reactions in the brain. The triptans work on receptors in your brain.  Triptans help to restore the balance of a neurotransmitter called serotonin. Fluctuations in levels of serotonin are thought to be a main cause of migraine.  Q: Are over-the-counter medications for migraine effective? A: Over-the-counter, or "OTC," medications may be effective in relieving mild to moderate pain and associated symptoms of migraine. But you should see your caregiver before beginning any treatment regimen for migraine.  Q: What are preventive medications for migraine? A: Preventive medications for migraine are sometimes referred to as "prophylactic" treatments. They are used to reduce the frequency, severity, and length of migraine attacks. Examples of preventive medications include antiepileptic medications, antidepressants, beta-blockers, calcium channel blockers, and NSAIDs (nonsteroidal anti-inflammatory drugs). Q: Why are anticonvulsants used to treat migraine? A: During the past few years, there has been an increased interest in antiepileptic drugs for the prevention of migraine. They are sometimes referred to as "anticonvulsants". Both epilepsy and migraine may be caused by similar reactions in the brain.  Q: Why are antidepressants used to treat migraine? A: Antidepressants are typically used to treat people with  depression. They may reduce migraine frequency by regulating chemical levels, such as serotonin, in the brain.  Q: What alternative therapies are used to treat migraine? A: The term "alternative therapies" is often used to describe treatments considered outside the scope of conventional Western medicine. Examples of alternative therapy include acupuncture, acupressure, and yoga. Another common alternative treatment is herbal therapy. Some herbs are believed to relieve headache pain. Always discuss alternative therapies with your caregiver before proceeding. Some herbal products contain arsenic and other toxins. TENSION HEADACHES Q: What is a tension-type headache? What  causes it? How can I treat it? A: Tension-type headaches occur randomly. They are often the result of temporary stress, anxiety, fatigue, or anger. Symptoms include soreness in your temples, a tightening band-like sensation around your head (a "vice-like" ache). Symptoms can also include a pulling feeling, pressure sensations, and contracting head and neck muscles. The headache begins in your forehead, temples, or the back of your head and neck. Treatment for tension-type headache may include over-the-counter or prescription medications. Treatment may also include self-help techniques such as relaxation training and biofeedback. CLUSTER HEADACHES Q: What is a cluster headache? What causes it? How can I treat it? A: Cluster headache gets its name because the attacks come in groups. The pain arrives with little, if any, warning. It is usually on one side of the head. A tearing or bloodshot eye and a runny nose on the same side of the headache may also accompany the pain. Cluster headaches are believed to be caused by chemical reactions in the brain. They have been described as the most severe and intense of any headache type. Treatment for cluster headache includes prescription medication and oxygen. SINUS HEADACHES Q: What is a sinus headache? What causes it? How can I treat it? A: When a cavity in the bones of the face and skull (a sinus) becomes inflamed, the inflammation will cause localized pain. This condition is usually the result of an allergic reaction, a tumor, or an infection. If your headache is caused by a sinus blockage, such as an infection, you will probably have a fever. An x-ray will confirm a sinus blockage. Your caregiver's treatment might include antibiotics for the infection, as well as antihistamines or decongestants.  REBOUND HEADACHES Q: What is a rebound headache? What causes it? How can I treat it? A: A pattern of taking acute headache medications too often can lead to a condition  known as "rebound headache." A pattern of taking too much headache medication includes taking it more than 2 days per week or in excessive amounts. That means more than the label or a caregiver advises. With rebound headaches, your medications not only stop relieving pain, they actually begin to cause headaches. Doctors treat rebound headache by tapering the medication that is being overused. Sometimes your caregiver will gradually substitute a different type of treatment or medication. Stopping may be a challenge. Regularly overusing a medication increases the potential for serious side effects. Consult a caregiver if you regularly use headache medications more than 2 days per week or more than the label advises. ADDITIONAL QUESTIONS AND ANSWERS Q: What is biofeedback? A: Biofeedback is a self-help treatment. Biofeedback uses special equipment to monitor your body's involuntary physical responses. Biofeedback monitors:  Breathing.  Pulse.  Heart rate.  Temperature.  Muscle tension.  Brain activity. Biofeedback helps you refine and perfect your relaxation exercises. You learn to control the physical responses that are related to stress. Once the technique has been mastered, you  do not need the equipment any more. Q: Are headaches hereditary? A: Four out of five (80%) of people that suffer report a family history of migraine. Scientists are not sure if this is genetic or a family predisposition. Despite the uncertainty, a child has a 50% chance of having migraine if one parent suffers. The child has a 75% chance if both parents suffer.  Q: Can children get headaches? A: By the time they reach high school, most young people have experienced some type of headache. Many safe and effective approaches or medications can prevent a headache from occurring or stop it after it has begun.  Q: What type of doctor should I see to diagnose and treat my headache? A: Start with your primary caregiver. Discuss  his or her experience and approach to headaches. Discuss methods of classification, diagnosis, and treatment. Your caregiver may decide to recommend you to a headache specialist, depending upon your symptoms or other physical conditions. Having diabetes, allergies, etc., may require a more comprehensive and inclusive approach to your headache. The National Headache Foundation will provide, upon request, a list of [facility] physician members in your state. Document Released: 09/26/2003 Document Revised: 09/28/2011 Document Reviewed: 03/05/2008 [facility] Patient Information 2014 [place], [place].

## 2013-12-18 ENCOUNTER — Telehealth: Payer: Self-pay

## 2013-12-18 NOTE — Telephone Encounter (Signed)
Received refill for Depakote Sprinkle. Please advise if ok to refill. Pt was last seen on 10/03/2013 and med was last filled on 08/17/2013.  Thanks!

## 2013-12-18 NOTE — Telephone Encounter (Signed)
This med is out of the scope of my practice.  If it was refilled under my name, it was a clerical error.

## 2013-12-19 ENCOUNTER — Ambulatory Visit (INDEPENDENT_AMBULATORY_CARE_PROVIDER_SITE_OTHER): Payer: Medicare PPO | Admitting: Cardiovascular Disease

## 2013-12-19 ENCOUNTER — Encounter: Payer: Self-pay | Admitting: Cardiovascular Disease

## 2013-12-19 VITALS — BP 108/66 | HR 70 | Ht 63.0 in | Wt 169.0 lb

## 2013-12-19 DIAGNOSIS — I2589 Other forms of chronic ischemic heart disease: Secondary | ICD-10-CM

## 2013-12-19 DIAGNOSIS — I5022 Chronic systolic (congestive) heart failure: Secondary | ICD-10-CM

## 2013-12-19 DIAGNOSIS — I5043 Acute on chronic combined systolic (congestive) and diastolic (congestive) heart failure: Secondary | ICD-10-CM

## 2013-12-19 DIAGNOSIS — E785 Hyperlipidemia, unspecified: Secondary | ICD-10-CM

## 2013-12-19 DIAGNOSIS — I10 Essential (primary) hypertension: Secondary | ICD-10-CM

## 2013-12-19 DIAGNOSIS — I509 Heart failure, unspecified: Secondary | ICD-10-CM

## 2013-12-19 DIAGNOSIS — I255 Ischemic cardiomyopathy: Secondary | ICD-10-CM

## 2013-12-19 MED ORDER — FUROSEMIDE 40 MG PO TABS: 40.0000 mg | ORAL_TABLET | Freq: Two times a day (BID) | ORAL | Status: DC

## 2013-12-19 NOTE — Patient Instructions (Signed)
Your physician wants you to follow-up in:  4 months. You will receive a reminder letter in the mail two months in advance. If you don't receive a letter, please call our office to schedule the follow-up appointment.   Your physician has recommended you make the following change in your medication:  Increase furosemide to 80 mg every morning and 40 mg every afternoon for one week.  After one week return to furosemide 40 mg by mouth twice daily.

## 2013-12-19 NOTE — Progress Notes (Signed)
  History of Present Illness: 68 yo female with history of cardiomyopathy, HTN, DM, HLD, anxiety/depression here today for cardiac follow up. She was admitted to [facility] August 2014 with lower extremity edema, dyspnea on exertion. Echo 03/09/13 with LVEF 25-30%. No significant valve disease. We discussed cardiac cath to exclude CAD but she was very agitated at times requiring a sitter (presumed to be from baseline dementia). She was combative with nursing staff. Stress myoview 03/14/13 was intermediate risk myoview with moderate anteroapical wall MI and moderate peri infarct ischemia. EF severely reduced at 33%. Given her dementia, she was managed conservatively. She was diuresed well during admission and discharged home on Lasix. She has done well over the last 9 months but was seen in the ED 12/15/13 with c/o weakness, headache and chest pain. Troponin negative. No evidence of ischemia on EKG so discharged home.   She is here today for follow up. She tells me she feels well. Husband gives most of history. She has not complained of chest pains at home except for that short time last week she went to the ED. Her biggest problem that day was headace.  She does have more dyspnea. Mostly sits on couch during the day. No LE edema.   Primary Care Physician: Ellison, Sean  Past Medical History  Diagnosis Date  . ALLERGIC RHINITIS 02/08/2007  . ANXIETY 02/08/2007  . Cough 08/15/2008  . DEPRESSION 02/08/2007  . DIABETES MELLITUS, TYPE I 02/08/2007  . Headache(784.0) 11/06/2008  . HYPERCHOLESTEROLEMIA 11/05/2008  . HYPERTENSION 02/08/2007  . URINARY INCONTINENCE 02/08/2007  . Dyslipidemia   . Non-ulcer dyspepsia   . Leukopenia   . DM retinopathy     background  . 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 Dementia   . CHF (congestive heart failure)     Past Surgical History  Procedure Laterality Date  . Total knee arthroplasty  1990    left  . Panendoscopy  10/27/1999  . Stress cardiolite   12/14/2003  . Electrocardiogram  05/17/2006  . Abdominal hysterectomy      Current Outpatient Prescriptions  Medication Sig Dispense Refill  . ACCU-CHEK SMARTVIEW test strip       . aspirin EC 325 MG tablet       . atorvastatin (LIPITOR) 40 MG tablet Take 40 mg by mouth daily.      . B-D ULTRAFINE III SHORT PEN 31G X 8 MM MISC       . BESIVANCE 0.6 % SUSP       . Blood Glucose Calibration (ACCU-CHEK AVIVA) SOLN       . Blood Glucose Monitoring Suppl (ACCU-CHEK AVIVA PLUS) W/DEVICE KIT       . carvedilol (COREG) 3.125 MG tablet Take 3.125 mg by mouth 2 (two) times daily with a meal.      . citalopram (CELEXA) 20 MG tablet Take 20 mg by mouth daily.      . divalproex (DEPAKOTE SPRINKLE) 125 MG capsule Take 125 mg by mouth 2 (two) times daily.      . furosemide (LASIX) 40 MG tablet Take 2 tablets by mouth every morning and 1 tablet every afternoon  90 tablet  6  . Insulin Lispro Prot & Lispro (50-50) 100 UNIT/ML SUPN Inject 40 Units into the skin daily with breakfast. And pen needles 1/day      . Lancet Devices (ADVOCATE LANCING DEVICE) MISC       . lisinopril (PRINIVIL,ZESTRIL) 2.5 MG tablet Take 2.5   mg by mouth daily.      . OLANZapine (ZYPREXA) 2.5 MG tablet Take 2.5 mg by mouth at bedtime.      . PHARMACIST CHOICE LANCETS MISC       . potassium chloride SA (K-DUR,KLOR-CON) 20 MEQ tablet Take 20 mEq by mouth daily.       No current facility-administered medications for this visit.    Allergies  Allergen Reactions  . Aspirin Nausea Only  . Codeine Nausea Only    History   Social History  . Marital Status: Married    Spouse Name: N/A    Number of Children: N/A  . Years of Education: N/A   Occupational History  . Retired    Social History Main Topics  . Smoking status: Never Smoker   . Smokeless tobacco: Never Used  . Alcohol Use: No  . Drug Use: No  . Sexual Activity: No   Other Topics Concern  . Not on file   Social History Narrative  . No narrative on file     Family History  Problem Relation Age of Onset  . Alcohol abuse Son   . Cancer Neg Hx     no cancer in immediate family  . CAD Father     Review of Systems:  As stated in the HPI and otherwise negative.   BP 108/66  Pulse 70  Ht 5' 3" (1.6 m)  Wt 169 lb (76.658 kg)  BMI 29.94 kg/m2  Physical Examination: General: Well developed, well nourished, NAD HEENT: OP clear, mucus membranes moist SKIN: warm, dry. No rashes. Neuro: No focal deficits Musculoskeletal: Muscle strength 5/5 all ext Psychiatric: Mood and affect normal Neck: No JVD, no carotid bruits, no thyromegaly, no lymphadenopathy. Lungs:Clear bilaterally, no wheezes, rhonci, crackles Cardiovascular: Regular rate and rhythm. No murmurs, gallops or rubs. Abdomen:Soft. Bowel sounds present. Non-tender.  Extremities: No lower extremity edema. Pulses are 2 + in the bilateral DP/PT.  EKG: NSR, anterior infarct.   Echo 03/09/13: Left ventricle: The cavity size was normal. Wall thickness was normal. Systolic function was severely reduced. The estimated ejection fraction was in the range of 25% to 30%. There is akinesis of the anteroseptal and apical myocardium. There is hypokinesis of the inferior myocardium. Doppler parameters are consistent with abnormal left ventricular relaxation (grade 1 diastolic dysfunction). - Mitral valve: Calcified annulus. Mildly thickened leaflets - Left atrium: The atrium was mildly dilated. - Pericardium, extracardiac: A trivial pericardial effusion  Assessment and Plan:   1. Ischemic Cardiomyopathy: Pt with new diagnosis of cardiomyopathy in hospital August 2014, LVEF=25-30%. Stress myoview with possible ischemia. She has rare chest pains. She is presumed to have CAD but given her profound dementia as witnessed in the hospital and lack of typical chest pain, we have elected for medical management only at this time. She is doing well with conservative therapy. Continue ASA, statin, beta  blocker, Ace-inh. If she has chest pain, could add Imdur. Her dementia is advanced.   2. Acute on chronic systolic CHF:  No LE edema but weight is up and she is more dyspneic. Will increase Lasix to 80 mg am and 40 mg pm for 7 days then back to 40 mg bid. Follow weights closely at home.   3. Hyperlipidemia: On a statin. Followed by Dr. Ellison  4. Dementia: Advanced. She is oriented to person only today. Husband reports worsened confusion at night.   5. HTN: BP controlled. No changes today.  

## 2013-12-20 ENCOUNTER — Telehealth: Payer: Self-pay | Admitting: Endocrinology

## 2013-12-20 DIAGNOSIS — F03918 Unspecified dementia, unspecified severity, with other behavioral disturbance: Secondary | ICD-10-CM

## 2013-12-20 DIAGNOSIS — F0391 Unspecified dementia with behavioral disturbance: Secondary | ICD-10-CM

## 2013-12-20 NOTE — Telephone Encounter (Signed)
Pt husband sated they have tried to get pt set up with a Psychiatrist but haven't not been able to get into the office due to wait list. Pt wanted to know if there was a office we could refer pt to. Please advise, Thanks!

## 2013-12-20 NOTE — Telephone Encounter (Signed)
This is usually prescribed by a neurologist or psychiatrist.  Do you see one of these?

## 2013-12-20 NOTE — Telephone Encounter (Signed)
Called pt ad advised that Depakote should not have been refilled under MD's name. Pt wanted to know who the medication refill should be directed to. Please advise, Thanks!

## 2013-12-20 NOTE — Telephone Encounter (Signed)
Patient husband states he called the pharm and they are wanting approval for Dana [name] Rx divalproensod 125mg    Pharmacy: [facility] [address]  Thank You :)

## 2013-12-20 NOTE — Telephone Encounter (Signed)
done

## 2013-12-21 NOTE — Telephone Encounter (Signed)
Pt's husband advised.  

## 2013-12-23 ENCOUNTER — Other Ambulatory Visit: Payer: Self-pay | Admitting: Endocrinology

## 2013-12-25 NOTE — Telephone Encounter (Signed)
Ok to refill? Med is under historical provider. Thanks!

## 2013-12-26 ENCOUNTER — Telehealth: Payer: Self-pay | Admitting: Endocrinology

## 2013-12-26 NOTE — Telephone Encounter (Signed)
Patient husband would like for you to call him back    Thank You :)

## 2013-12-27 ENCOUNTER — Other Ambulatory Visit (INDEPENDENT_AMBULATORY_CARE_PROVIDER_SITE_OTHER): Payer: Commercial Managed Care - HMO | Admitting: Ophthalmology

## 2013-12-27 DIAGNOSIS — H3581 Retinal edema: Secondary | ICD-10-CM

## 2013-12-27 DIAGNOSIS — E1165 Type 2 diabetes mellitus with hyperglycemia: Secondary | ICD-10-CM

## 2013-12-27 DIAGNOSIS — E1139 Type 2 diabetes mellitus with other diabetic ophthalmic complication: Secondary | ICD-10-CM

## 2013-12-27 MED ORDER — OLANZAPINE 2.5 MG PO TABS: 2.5000 mg | ORAL_TABLET | Freq: Every day | ORAL | Status: DC

## 2013-12-27 MED ORDER — DIVALPROEX SODIUM 125 MG PO CPSP: 125.0000 mg | ORAL_CAPSULE | Freq: Two times a day (BID) | ORAL | Status: DC

## 2013-12-27 NOTE — Telephone Encounter (Signed)
Please refill depakote and zyprexa, each x 30 days

## 2013-12-27 NOTE — Telephone Encounter (Signed)
[facility]. The earliest the pt can get appointment is toward the end of June to July. Would it be possible to give a 30 day supply until pt see new provider? Thanks!

## 2013-12-27 NOTE — Telephone Encounter (Signed)
Pt advised.

## 2013-12-29 ENCOUNTER — Ambulatory Visit (INDEPENDENT_AMBULATORY_CARE_PROVIDER_SITE_OTHER): Payer: Medicare PPO | Admitting: Endocrinology

## 2013-12-29 ENCOUNTER — Encounter: Payer: Self-pay | Admitting: Endocrinology

## 2013-12-29 VITALS — BP 132/90 | HR 90 | Temp 99.0°F | Ht 63.0 in | Wt 172.0 lb

## 2013-12-29 DIAGNOSIS — E109 Type 1 diabetes mellitus without complications: Secondary | ICD-10-CM

## 2013-12-29 LAB — HEMOGLOBIN A1C: Hgb A1c MFr Bld: 8.4 % — ABNORMAL HIGH (ref 4.6–6.5)

## 2013-12-29 NOTE — Patient Instructions (Addendum)
A diabetes blood test is requested for you today.  We'll contact you with results. Please change insulin to 35 units with breakfast, and 5 units with the evening meal. Please come back for a follow-up appointment in 3 months check your blood sugar twice a day.  vary the time of day when you check, between before the 3 meals, and at bedtime.  also check if you have symptoms of your blood sugar being too high or too low.  please keep a record of the readings and bring it to your next appointment here.  You can write it on any piece of paper.  please call us sooner if your blood sugar goes below 70, or if you have a lot of readings over 200.

## 2013-12-29 NOTE — Progress Notes (Signed)
Subjective:    Patient ID: Dana [name], female    DOB: 1945-11-13, 68 y.o.   MRN: [id number]  HPI She returns for f/u of type 2 DM (dx'ed 1987, on a routine blood test; she has mild if any neuropathy of the lower extremities; no known associated chronic complications; therapy has been limited by severe noncompliance and dementia; she was admitted in march of 2014, with severe hyperglycemia; she has never had pancreatitis or DKA; husband checks cbg's and gives pt insulin injections).  he brings a record of her cbg's which i have reviewed today.  It is often low in the afternoon (50-70).  It is higher often over 200) at other times of day.   Past Medical History  Diagnosis Date  . ALLERGIC RHINITIS 02/08/2007  . ANXIETY 02/08/2007  . Cough 08/15/2008  . DEPRESSION 02/08/2007  . DIABETES MELLITUS, TYPE I 02/08/2007  . Headache(784.0) 11/06/2008  . HYPERCHOLESTEROLEMIA 11/05/2008  . HYPERTENSION 02/08/2007  . URINARY INCONTINENCE 02/08/2007  . Dyslipidemia   . Non-ulcer dyspepsia   . Leukopenia   . DM retinopathy     background  . 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 Dementia   . CHF (congestive heart failure)     Past Surgical History  Procedure Laterality Date  . Total knee arthroplasty  1990    left  . Panendoscopy  10/27/1999  . Stress cardiolite  12/14/2003  . Electrocardiogram  05/17/2006  . Abdominal hysterectomy      History   Social History  . Marital Status: Married    Spouse Name: N/A    Number of Children: N/A  . Years of Education: N/A   Occupational History  . Retired    Social History Main Topics  . Smoking status: Never Smoker   . Smokeless tobacco: Never Used  . Alcohol Use: No  . Drug Use: No  . Sexual Activity: No   Other Topics Concern  . Not on file   Social History Narrative  . No narrative on file    Current Outpatient Prescriptions on File Prior to Visit  Medication Sig Dispense Refill  . ACCU-CHEK SMARTVIEW test strip       .  aspirin EC 325 MG tablet       . atorvastatin (LIPITOR) 40 MG tablet Take 40 mg by mouth daily.      . B-D ULTRAFINE III SHORT PEN 31G X 8 MM MISC       . BESIVANCE 0.6 % SUSP       . Blood Glucose Calibration (ACCU-CHEK AVIVA) SOLN       . Blood Glucose Monitoring Suppl (ACCU-CHEK AVIVA PLUS) W/DEVICE KIT       . carvedilol (COREG) 3.125 MG tablet Take 3.125 mg by mouth 2 (two) times daily with a meal.      . carvedilol (COREG) 3.125 MG tablet TAKE 1 TABLET BY MOUTH 2 (TWO) TIMES DAILY WITH A MEAL.  60 tablet  0  . citalopram (CELEXA) 20 MG tablet Take 20 mg by mouth daily.      . divalproex (DEPAKOTE SPRINKLE) 125 MG capsule Take 1 capsule (125 mg total) by mouth 2 (two) times daily.  60 capsule  0  . furosemide (LASIX) 40 MG tablet Take 1 tablet (40 mg total) by mouth 2 (two) times daily.  60 tablet  6  . Insulin Lispro Prot & Lispro (50-50) 100 UNIT/ML SUPN Inject 40 Units into the skin daily with  breakfast. And pen needles 1/day      . Lancet Devices (ADVOCATE LANCING DEVICE) MISC       . lisinopril (PRINIVIL,ZESTRIL) 2.5 MG tablet Take 2.5 mg by mouth daily.      Marland Kitchen OLANZapine (ZYPREXA) 2.5 MG tablet Take 1 tablet (2.5 mg total) by mouth at bedtime.  30 tablet  0  . PHARMACIST CHOICE LANCETS MISC       . potassium chloride SA (K-DUR,KLOR-CON) 20 MEQ tablet Take 20 mEq by mouth daily.       No current facility-administered medications on file prior to visit.    Allergies  Allergen Reactions  . Aspirin Nausea Only  . Codeine Nausea Only   Family History  Problem Relation Age of Onset  . Alcohol abuse Son   . Cancer Neg Hx     no cancer in immediate family  . CAD Father    BP 132/90  Pulse 90  Temp(Src) 99 F (37.2 C) (Oral)  Ht _0  (1.6 m)  Wt 172 lb (78.019 kg)  BMI 30.48 kg/m2  SpO2 99%  Review of Systems She denies LOC. She has gained weight    Objective:   Physical Exam  Pulses: dorsalis pedis intact bilat.  Feet: no deformity. feet are of normal color and  temp. no edema. There is bilateral onychomycosis.  Skin: no ulcer on the feet.  Neuro: sensation is intact to touch on the feet  Lab Results  Component Value Date   HGBA1C 8.4* 12/29/2013      Assessment & Plan:  DM: This insulin regimen was chosen from multiple options, for its simplicity.  The benefits of glycemic control must be weighed against the risks of hypoglycemia.  Moderate exacerbation.   Dementia: this complicates the rx of DM.  This impairs the ability to achieve glycemic control.  I'll work around this as best I can.  Husband is helping.    Patient is advised the following: Patient Instructions  A diabetes blood test is requested for you today.  We'll contact you with results. Please change insulin to 35 units with breakfast, and 5 units with the evening meal. Please come back for a follow-up appointment in 3 months check your blood sugar twice a day.  vary the time of day when you check, between before the 3 meals, and at bedtime.  also check if you have symptoms of your blood sugar being too high or too low.  please keep a record of the readings and bring it to your next appointment here.  You can write it on any piece of paper.  please call us sooner if your blood sugar goes below 70, or if you have a lot of readings over 200.

## 2014-01-03 ENCOUNTER — Other Ambulatory Visit (INDEPENDENT_AMBULATORY_CARE_PROVIDER_SITE_OTHER): Payer: Commercial Managed Care - HMO | Admitting: Ophthalmology

## 2014-01-03 DIAGNOSIS — H3581 Retinal edema: Secondary | ICD-10-CM

## 2014-01-04 ENCOUNTER — Telehealth: Payer: Self-pay | Admitting: Endocrinology

## 2014-01-04 NOTE — Telephone Encounter (Signed)
Please call pt's husband with clarification on dosage of Pravastatin

## 2014-01-05 NOTE — Telephone Encounter (Signed)
Called 2 times and left message to call back.

## 2014-01-08 ENCOUNTER — Other Ambulatory Visit: Payer: Self-pay

## 2014-01-12 ENCOUNTER — Ambulatory Visit: Payer: Self-pay | Admitting: Nurse Practitioner

## 2014-01-18 ENCOUNTER — Other Ambulatory Visit: Payer: Self-pay | Admitting: Endocrinology

## 2014-01-20 ENCOUNTER — Other Ambulatory Visit: Payer: Self-pay | Admitting: Cardiovascular Disease

## 2014-01-26 ENCOUNTER — Telehealth: Payer: Self-pay

## 2014-01-26 NOTE — Telephone Encounter (Signed)
Ov 1 PM, 01/31/14.

## 2014-01-26 NOTE — Telephone Encounter (Signed)
Pt's husband called stating that for the past week after 3pm the pt gets really confused. He states that its becoming more frequent and just wanted to notify MD to see if anything should be done. Please advise, Thanks!

## 2014-01-27 ENCOUNTER — Other Ambulatory Visit: Payer: Self-pay | Admitting: Endocrinology

## 2014-01-29 NOTE — Telephone Encounter (Signed)
Noted, pt's husband advised. Pt will be scheduled.

## 2014-01-31 ENCOUNTER — Ambulatory Visit (INDEPENDENT_AMBULATORY_CARE_PROVIDER_SITE_OTHER): Payer: Medicare PPO | Admitting: Endocrinology

## 2014-01-31 ENCOUNTER — Encounter: Payer: Self-pay | Admitting: Endocrinology

## 2014-01-31 VITALS — BP 130/90 | HR 77 | Temp 97.7°F | Ht 63.0 in | Wt 180.0 lb

## 2014-01-31 DIAGNOSIS — G309 Alzheimer's disease, unspecified: Principal | ICD-10-CM

## 2014-01-31 DIAGNOSIS — F0280 Dementia in other diseases classified elsewhere without behavioral disturbance: Secondary | ICD-10-CM

## 2014-01-31 MED ORDER — DONEPEZIL HCL 5 MG PO TABS: 5.0000 mg | ORAL_TABLET | Freq: Every day | ORAL | Status: DC

## 2014-01-31 NOTE — Progress Notes (Signed)
Subjective:    Patient ID: Dana [name], female    DOB: 1945-11-26, 68 y.o.   MRN: [id number]  HPI Pt's husband says memory is much worse, but no assoc deficiency with ALD's.   Past Medical History  Diagnosis Date  . ALLERGIC RHINITIS 02/08/2007  . ANXIETY 02/08/2007  . Cough 08/15/2008  . DEPRESSION 02/08/2007  . DIABETES MELLITUS, TYPE I 02/08/2007  . Headache(784.0) 11/06/2008  . HYPERCHOLESTEROLEMIA 11/05/2008  . HYPERTENSION 02/08/2007  . URINARY INCONTINENCE 02/08/2007  . Dyslipidemia   . Non-ulcer dyspepsia   . Leukopenia   . DM retinopathy     background  . 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 Dementia   . CHF (congestive heart failure)     Past Surgical History  Procedure Laterality Date  . Total knee arthroplasty  1990    left  . Panendoscopy  10/27/1999  . Stress cardiolite  12/14/2003  . Electrocardiogram  05/17/2006  . Abdominal hysterectomy      History   Social History  . Marital Status: Married    Spouse Name: N/A    Number of Children: N/A  . Years of Education: N/A   Occupational History  . Retired    Social History Main Topics  . Smoking status: Never Smoker   . Smokeless tobacco: Never Used  . Alcohol Use: No  . Drug Use: No  . Sexual Activity: No   Other Topics Concern  . Not on file   Social History Narrative  . No narrative on file    Current Outpatient Prescriptions on File Prior to Visit  Medication Sig Dispense Refill  . ACCU-CHEK SMARTVIEW test strip       . aspirin EC 325 MG tablet       . atorvastatin (LIPITOR) 40 MG tablet Take 40 mg by mouth daily.      Marland Kitchen BESIVANCE 0.6 % SUSP       . Blood Glucose Calibration (ACCU-CHEK AVIVA) SOLN       . Blood Glucose Monitoring Suppl (ACCU-CHEK AVIVA PLUS) W/DEVICE KIT       . carvedilol (COREG) 3.125 MG tablet Take 3.125 mg by mouth 2 (two) times daily with a meal.      . citalopram (CELEXA) 20 MG tablet Take 20 mg by mouth daily.      . divalproex (DEPAKOTE SPRINKLE) 125  MG capsule Take 1 capsule (125 mg total) by mouth 2 (two) times daily.  60 capsule  0  . furosemide (LASIX) 40 MG tablet Take 1 tablet (40 mg total) by mouth 2 (two) times daily.  60 tablet  6  . Insulin Lispro Prot & Lispro (50-50) 100 UNIT/ML SUPN 35 units with breakfast, and 5 units with the evening meal, and pen needles 1/day      . Insulin Pen Needle (B-D ULTRAFINE III SHORT PEN) 31G X 8 MM MISC Use 2 per day.  100 each  6  . Lancet Devices (ADVOCATE LANCING DEVICE) MISC       . lisinopril (PRINIVIL,ZESTRIL) 2.5 MG tablet Take 2.5 mg by mouth daily.      Marland Kitchen OLANZapine (ZYPREXA) 2.5 MG tablet Take 1 tablet (2.5 mg total) by mouth at bedtime.  30 tablet  0  . PHARMACIST CHOICE LANCETS MISC       . potassium chloride SA (K-DUR,KLOR-CON) 20 MEQ tablet Take 20 mEq by mouth daily.       No current facility-administered medications on file prior  to visit.    Allergies  Allergen Reactions  . Aspirin Nausea Only  . Codeine Nausea Only    Family History  Problem Relation Age of Onset  . Alcohol abuse Son   . Cancer Neg Hx     no cancer in immediate family  . CAD Father     BP 130/90  Pulse 77  Temp(Src) 97.7 F (36.5 C) (Oral)  Ht 5' 3"  (1.6 m)  Wt 180 lb (81.647 kg)  BMI 31.89 kg/m2  SpO2 97%   Review of Systems Denies falls.  Husband says no behavior problems.      Objective:   Physical Exam VITAL SIGNS:  See vs page GENERAL: no distress Gait: normal and steady.   PSYCH: Alert and oriented to self and place.  Says it is 2009.  Does not appear anxious nor depressed.     Head CT (may, 2015): NAD    Assessment & Plan:  Dementia: moderate exacerbation.  Patient is advised the following: Patient Instructions  Let's check your vitamin B-12 with your blood sugar test. Please keep appointment with psychiatrist in a few weeks. i have sent a prescription to your pharmacy, for the memory. The psychiatrist may choose to double it.  That is OK with me.   Cc Dr Casimiro Needle.     Please come back for a follow-up appointment in 6 weeks.

## 2014-01-31 NOTE — Patient Instructions (Addendum)
Let's check your vitamin B-12 with your blood sugar test. Please keep appointment with psychiatrist in a few weeks. i have sent a prescription to your pharmacy, for the memory. The psychiatrist may choose to double it.  That is OK with me.   Cc Dr Donell BeersPlovsky.   Please come back for a follow-up appointment in 6 weeks.

## 2014-02-02 ENCOUNTER — Ambulatory Visit: Payer: Self-pay | Admitting: Endocrinology

## 2014-02-12 ENCOUNTER — Other Ambulatory Visit: Payer: Self-pay | Admitting: Cardiovascular Disease

## 2014-02-13 ENCOUNTER — Other Ambulatory Visit (HOSPITAL_COMMUNITY): Payer: Self-pay | Admitting: Cardiovascular Disease

## 2014-02-24 ENCOUNTER — Other Ambulatory Visit: Payer: Self-pay | Admitting: Endocrinology

## 2014-02-24 ENCOUNTER — Other Ambulatory Visit (HOSPITAL_COMMUNITY): Payer: Self-pay | Admitting: Cardiovascular Disease

## 2014-03-02 ENCOUNTER — Other Ambulatory Visit: Payer: Self-pay | Admitting: Endocrinology

## 2014-03-05 ENCOUNTER — Telehealth: Payer: Self-pay | Admitting: Endocrinology

## 2014-03-05 NOTE — Telephone Encounter (Signed)
Pt's husband called reporting blood sugar reading. Pt has been having lows in the evening some of the numbers have been, 86, 65,78,78, 67,49, 54, and 82. Sugars in the morning have been average around 180. Insulin dosage is 35 units with breakfast and 5 with supper. Please advise, Thanks!

## 2014-03-05 NOTE — Telephone Encounter (Signed)
Spoke with pt's husband and advised him the Rx for the Coreg has been sent to the pharmacy. Pt's Husband sates the diarrhea has subsided.

## 2014-03-05 NOTE — Telephone Encounter (Signed)
Patients husband called wanting to know if we have received her refill from pharmacy Also, her husband is concerned about Mrs. Dana [name] having diarrhea; he thought he saw some blood   Please advise patient   Thank you

## 2014-03-05 NOTE — Telephone Encounter (Signed)
Please advise if ok to refill. Med is listed under historical provider. Thanks! 

## 2014-03-05 NOTE — Telephone Encounter (Signed)
Please change insulin to 25 units with breakfast and 10 with supper. Ov next available.

## 2014-03-05 NOTE — Telephone Encounter (Signed)
Pt's husband advised . Pt coming for appointment on 9/1.

## 2014-03-07 ENCOUNTER — Telehealth: Payer: Self-pay | Admitting: Endocrinology

## 2014-03-08 NOTE — Telephone Encounter (Signed)
Called and advised pt to keep appointment fo 8/21 at 11 am. Will discuss at office visit.

## 2014-03-08 NOTE — Telephone Encounter (Signed)
Patient Husband states this that patient (wife) has been really sick, been having loose stool for about 2 weeks, and is getting weaker. She has an appointment tomorrow

## 2014-03-09 ENCOUNTER — Ambulatory Visit (INDEPENDENT_AMBULATORY_CARE_PROVIDER_SITE_OTHER): Payer: Medicare PPO | Admitting: Endocrinology

## 2014-03-09 ENCOUNTER — Encounter: Payer: Self-pay | Admitting: Endocrinology

## 2014-03-09 VITALS — BP 112/72 | HR 76 | Temp 98.4°F | Wt 178.0 lb

## 2014-03-09 DIAGNOSIS — R197 Diarrhea, unspecified: Secondary | ICD-10-CM | POA: Insufficient documentation

## 2014-03-09 NOTE — Patient Instructions (Addendum)
blood tests are being requested for you today.  We'll contact you with results.   Please call your psychiatrist, to see if the depakote could be causing this.  I hope you feel better soon.  If you don't feel better by next week, please call back.  Please call sooner if you get worse. For now, please stop taking the furosemide and potassium. Please come back for a follow-up appointment in 4 days.

## 2014-03-09 NOTE — Progress Notes (Signed)
Subjective:    Patient ID: Marcine Matar, female    DOB: 10/29/1945, 68 y.o.   MRN: [id number]  HPI Pt states few weeks of slight cramps throughout the abdomen, and assoc diarrhea.  No recent antibiotics.   no cbg record, but states cbg's are in the 100's.   Past Medical History  Diagnosis Date  . ALLERGIC RHINITIS 02/08/2007  . ANXIETY 02/08/2007  . Cough 08/15/2008  . DEPRESSION 02/08/2007  . DIABETES MELLITUS, TYPE I 02/08/2007  . Headache(784.0) 11/06/2008  . HYPERCHOLESTEROLEMIA 11/05/2008  . HYPERTENSION 02/08/2007  . URINARY INCONTINENCE 02/08/2007  . Dyslipidemia   . Non-ulcer dyspepsia   . Leukopenia   . DM retinopathy     background  . 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 Dementia   . CHF (congestive heart failure)     Past Surgical History  Procedure Laterality Date  . Total knee arthroplasty  1990    left  . Panendoscopy  10/27/1999  . Stress cardiolite  12/14/2003  . Electrocardiogram  05/17/2006  . Abdominal hysterectomy      History   Social History  . Marital Status: Married    Spouse Name: N/A    Number of Children: N/A  . Years of Education: N/A   Occupational History  . Retired    Social History Main Topics  . Smoking status: Never Smoker   . Smokeless tobacco: Never Used  . Alcohol Use: No  . Drug Use: No  . Sexual Activity: No   Other Topics Concern  . Not on file   Social History Narrative  . No narrative on file    Current Outpatient Prescriptions on File Prior to Visit  Medication Sig Dispense Refill  . ACCU-CHEK SMARTVIEW test strip       . aspirin EC 325 MG tablet       . aspirin EC 325 MG tablet TAKE 1 TABLET (325 MG TOTAL) BY MOUTH DAILY.  30 tablet  3  . atorvastatin (LIPITOR) 40 MG tablet Take 40 mg by mouth daily.      Marland Kitchen BESIVANCE 0.6 % SUSP       . Blood Glucose Calibration (ACCU-CHEK AVIVA) SOLN       . Blood Glucose Monitoring Suppl (ACCU-CHEK AVIVA PLUS) W/DEVICE KIT       . carvedilol (COREG) 3.125 MG  tablet Take 3.125 mg by mouth 2 (two) times daily with a meal.      . citalopram (CELEXA) 20 MG tablet Take 20 mg by mouth daily.      Marland Kitchen donepezil (ARICEPT) 5 MG tablet Take 1 tablet (5 mg total) by mouth at bedtime.  30 tablet  11  . Insulin Lispro Prot & Lispro (50-50) 100 UNIT/ML SUPN 35 units with breakfast, and 5 units with the evening meal, and pen needles 1/day      . Insulin Pen Needle (B-D ULTRAFINE III SHORT PEN) 31G X 8 MM MISC Use 2 per day.  100 each  6  . Lancet Devices (ADVOCATE LANCING DEVICE) MISC       . lisinopril (PRINIVIL,ZESTRIL) 2.5 MG tablet TAKE 1 TABLET (2.5 MG TOTAL) BY MOUTH DAILY.  30 tablet  3  . OLANZapine (ZYPREXA) 2.5 MG tablet Take 1 tablet (2.5 mg total) by mouth at bedtime.  30 tablet  0  . PHARMACIST CHOICE LANCETS MISC        No current facility-administered medications on file prior to visit.  Allergies  Allergen Reactions  . Aspirin Nausea Only  . Codeine Nausea Only    Family History  Problem Relation Age of Onset  . Alcohol abuse Son   . Cancer Neg Hx     no cancer in immediate family  . CAD Father     BP 112/72  Pulse 76  Temp(Src) 98.4 F (36.9 C) (Oral)  Wt 178 lb (80.74 kg)  SpO2 98%    Review of Systems Denies fever, n/v, and brbpr.      Objective:   Physical Exam VITAL SIGNS:  See vs page. GENERAL: no distress. ABDOMEN: abdomen is soft, nontender.  no hepatosplenomegaly. not distended.  no hernia.      Lab Results  Component Value Date   WBC 7.8 03/09/2014   HGB 11.4* 03/09/2014   HCT 34.6* 03/09/2014   MCV 86.3 03/09/2014   PLT 418.0* 03/09/2014   Lab Results  Component Value Date   CREATININE 0.8 03/09/2014   BUN 12 03/09/2014   NA 137 03/09/2014   K 4.3 03/09/2014   CL 95* 03/09/2014   CO2 30 03/09/2014       Assessment & Plan:  abd pain, new, uncertain etiology Anemia, stable DM: apparently well-controlled CHF: she needs less diuretic rx now, but she'll have to be followed.    Patient is advised the  following: Patient Instructions  blood tests are being requested for you today.  We'll contact you with results.   Please call your psychiatrist, to see if the depakote could be causing this.  I hope you feel better soon.  If you don't feel better by next week, please call back.  Please call sooner if you get worse. For now, please stop taking the furosemide and potassium. Please come back for a follow-up appointment in 4 days.

## 2014-03-10 LAB — BASIC METABOLIC PANEL
BUN: 12 mg/dL (ref 6–23)
CALCIUM: 8.8 mg/dL (ref 8.4–10.5)
CHLORIDE: 95 meq/L — AB (ref 96–112)
CO2: 30 meq/L (ref 19–32)
Creatinine, Ser: 0.8 mg/dL (ref 0.4–1.2)
GFR: 96.74 mL/min (ref >60.00)
Glucose, Bld: 217 mg/dL — ABNORMAL HIGH (ref 70–99)
Potassium: 4.3 mEq/L (ref 3.5–5.1)
SODIUM: 137 meq/L (ref 135–145)

## 2014-03-10 LAB — CBC WITH DIFFERENTIAL/PLATELET
BASOS PCT: 0.6 % (ref 0.0–3.0)
Basophils Absolute: 0 10*3/uL (ref 0.0–0.1)
Eosinophils Absolute: 0.2 10*3/uL (ref 0.0–0.7)
Eosinophils Relative: 2.4 % (ref 0.0–5.0)
HCT: 34.6 % — ABNORMAL LOW (ref 36.0–46.0)
HEMOGLOBIN: 11.4 g/dL — AB (ref 12.0–15.0)
LYMPHS PCT: 16.1 % (ref 12.0–46.0)
Lymphs Abs: 1.3 10*3/uL (ref 0.7–4.0)
MCHC: 32.8 g/dL (ref 30.0–36.0)
MCV: 86.3 fl (ref 78.0–100.0)
Monocytes Absolute: 0.6 10*3/uL (ref 0.1–1.0)
Monocytes Relative: 7.5 % (ref 3.0–12.0)
NEUTROS PCT: 73.4 % (ref 43.0–77.0)
Neutro Abs: 5.8 10*3/uL (ref 1.4–7.7)
Platelets: 418 10*3/uL — ABNORMAL HIGH (ref 150.0–400.0)
RBC: 4.01 Mil/uL (ref 3.87–5.11)
RDW: 15.1 % (ref 11.5–15.5)
WBC: 7.8 10*3/uL (ref 4.0–10.5)

## 2014-03-12 ENCOUNTER — Telehealth: Payer: Self-pay

## 2014-03-12 ENCOUNTER — Ambulatory Visit: Payer: Self-pay | Admitting: Endocrinology

## 2014-03-12 NOTE — Telephone Encounter (Signed)
Result note was closed in error. Pt's husband advised of lab results.

## 2014-03-19 ENCOUNTER — Encounter: Payer: Self-pay | Admitting: Endocrinology

## 2014-03-19 ENCOUNTER — Ambulatory Visit (INDEPENDENT_AMBULATORY_CARE_PROVIDER_SITE_OTHER): Payer: Medicare PPO | Admitting: Endocrinology

## 2014-03-19 VITALS — BP 128/90 | HR 77 | Temp 98.2°F | Ht 63.0 in | Wt 167.0 lb

## 2014-03-19 DIAGNOSIS — R197 Diarrhea, unspecified: Secondary | ICD-10-CM

## 2014-03-19 DIAGNOSIS — Z23 Encounter for immunization: Secondary | ICD-10-CM

## 2014-03-19 NOTE — Patient Instructions (Addendum)
Please see a gastroenterology specialist.  you will receive a phone call, about a day and time for an appointment. For now, please stay off the furosemide and potassium.   Please reduce insulin to 30 units with breakfast, and none in the evening.   Please come back for a follow-up appointment in 2 weeks.

## 2014-03-19 NOTE — Progress Notes (Signed)
Subjective:    Patient ID: Dana [name], female    DOB: [birth year], 68 y.o.   MRN: [id number]  HPI The state of at least three ongoing medical problems is addressed today, with interval history of each noted here: Diarrhea persists. CHF: she is still off lasix; she denies sob DM: she has lost weight.  no cbg record, but states cbg's are mildly low a few times a week, usually at hs.  Past Medical History  Diagnosis Date  . ALLERGIC RHINITIS 02/08/2007  . ANXIETY 02/08/2007  . Cough 08/15/2008  . DEPRESSION 02/08/2007  . DIABETES MELLITUS, TYPE I 02/08/2007  . Headache(784.0) 11/06/2008  . HYPERCHOLESTEROLEMIA 11/05/2008  . HYPERTENSION 02/08/2007  . URINARY INCONTINENCE 02/08/2007  . Dyslipidemia   . Non-ulcer dyspepsia   . Leukopenia   . DM retinopathy     background  . 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 Dementia   . CHF (congestive heart failure)     Past Surgical History  Procedure Laterality Date  . Total knee arthroplasty  1990    left  . Panendoscopy  10/27/1999  . Stress cardiolite  12/14/2003  . Electrocardiogram  05/17/2006  . Abdominal hysterectomy      History   Social History  . Marital Status: Married    Spouse Name: N/A    Number of Children: N/A  . Years of Education: N/A   Occupational History  . Retired    Social History Main Topics  . Smoking status: Never Smoker   . Smokeless tobacco: Never Used  . Alcohol Use: No  . Drug Use: No  . Sexual Activity: No   Other Topics Concern  . Not on file   Social History Narrative  . No narrative on file    Current Outpatient Prescriptions on File Prior to Visit  Medication Sig Dispense Refill  . ACCU-CHEK SMARTVIEW test strip       . aspirin EC 325 MG tablet       . atorvastatin (LIPITOR) 40 MG tablet Take 40 mg by mouth daily.      Marland Kitchen BESIVANCE 0.6 % SUSP       . Blood Glucose Calibration (ACCU-CHEK AVIVA) SOLN       . carvedilol (COREG) 3.125 MG tablet Take 3.125 mg by mouth 2 (two)  times daily with a meal.      . citalopram (CELEXA) 20 MG tablet Take 20 mg by mouth daily.      . divalproex (DEPAKOTE SPRINKLE) 125 MG capsule Take 250 mg by mouth daily.       Marland Kitchen donepezil (ARICEPT) 5 MG tablet Take 1 tablet (5 mg total) by mouth at bedtime.  30 tablet  11  . Insulin Lispro Prot & Lispro (50-50) 100 UNIT/ML SUPN Inject 30 Units into the skin daily with breakfast. and pen needles 1/day      . Insulin Pen Needle (B-D ULTRAFINE III SHORT PEN) 31G X 8 MM MISC Use 2 per day.  100 each  6  . Lancet Devices (ADVOCATE LANCING DEVICE) MISC       . lisinopril (PRINIVIL,ZESTRIL) 2.5 MG tablet TAKE 1 TABLET (2.5 MG TOTAL) BY MOUTH DAILY.  30 tablet  3  . PHARMACIST CHOICE LANCETS MISC        No current facility-administered medications on file prior to visit.    Allergies  Allergen Reactions  . Aspirin Nausea Only  . Codeine Nausea Only    Family History  Problem Relation Age of Onset  . Alcohol abuse Son   . Cancer Neg Hx     no cancer in immediate family  . CAD Father     BP 128/90  Pulse 77  Temp(Src) 98.2 F (36.8 C) (Oral)  Ht  (1.6 m)  Wt 167 lb (75.751 kg)  BMI 29.59 kg/m2  SpO2 99%   Review of Systems Denies chest pain and n/v    Objective:   Physical Exam VITAL SIGNS:  See vs page GENERAL: no distress.  In wheelchair. LUNGS:  Clear to auscultation HEART:  Regular rate and rhythm without murmurs noted. Normal S1,S2.   ABDOMEN: abdomen is soft, nontender.  no hepatosplenomegaly. not distended.  no hernia Ext: no edema.         Assessment & Plan:  Diarrhea, persistent, uncertain etiology CHF: well-controlled off diuretic rx (prob due to the diarrhea) Weight loss, prob due to GI illness. DM: overcontrolled, prob due to weight loss.     Patient is advised the following: Patient Instructions  Please see a gastroenterology specialist.  you will receive a phone call, about a day and time for an appointment. For now, please stay off the  furosemide and potassium.   Please reduce insulin to 30 units with breakfast, and none in the evening.   Please come back for a follow-up appointment in 2 weeks.

## 2014-03-20 ENCOUNTER — Ambulatory Visit: Payer: Self-pay | Admitting: Endocrinology

## 2014-03-22 ENCOUNTER — Encounter: Payer: Self-pay | Admitting: Gastroenterology

## 2014-04-04 ENCOUNTER — Encounter: Payer: Self-pay | Admitting: Endocrinology

## 2014-04-04 ENCOUNTER — Ambulatory Visit (INDEPENDENT_AMBULATORY_CARE_PROVIDER_SITE_OTHER): Payer: Medicare PPO | Admitting: Endocrinology

## 2014-04-04 VITALS — BP 132/86 | HR 78 | Temp 98.5°F | Ht 63.0 in | Wt 172.0 lb

## 2014-04-04 DIAGNOSIS — E876 Hypokalemia: Secondary | ICD-10-CM

## 2014-04-04 DIAGNOSIS — F0391 Unspecified dementia with behavioral disturbance: Secondary | ICD-10-CM

## 2014-04-04 DIAGNOSIS — F03918 Unspecified dementia, unspecified severity, with other behavioral disturbance: Secondary | ICD-10-CM

## 2014-04-04 DIAGNOSIS — D649 Anemia, unspecified: Secondary | ICD-10-CM

## 2014-04-04 DIAGNOSIS — E109 Type 1 diabetes mellitus without complications: Secondary | ICD-10-CM

## 2014-04-04 MED ORDER — METRONIDAZOLE 250 MG PO TABS: 250.0000 mg | ORAL_TABLET | Freq: Three times a day (TID) | ORAL | Status: DC

## 2014-04-04 NOTE — Progress Notes (Signed)
Subjective:    Patient ID: Dana [name], female    DOB: [birth year], 68 y.o.   MRN: [id number]  HPI history is from pt and husband.   The state of at least three ongoing medical problems is addressed today, with interval history of each noted here: He called psychiatrist about the possibility that depakote is contributing to diarrhea, and is waiting for a reply. Diarrhea is improved but not resolved. CHF: she is still off lasix; she denies sob Hypokalemia: denies cramps. DM: she has lost weight.  she brings a record of her cbg's which i have reviewed today.  Despite the decreased insulin, she has mild hypoglycemia almost daily, usually at hs.   Past Medical History  Diagnosis Date  . ALLERGIC RHINITIS 02/08/2007  . ANXIETY 02/08/2007  . Cough 08/15/2008  . DEPRESSION 02/08/2007  . DIABETES MELLITUS, TYPE I 02/08/2007  . Headache(784.0) 11/06/2008  . HYPERCHOLESTEROLEMIA 11/05/2008  . HYPERTENSION 02/08/2007  . URINARY INCONTINENCE 02/08/2007  . Dyslipidemia   . Non-ulcer dyspepsia   . Leukopenia   . DM retinopathy     background  . 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 Dementia   . CHF (congestive heart failure)     Past Surgical History  Procedure Laterality Date  . Total knee arthroplasty  1990    left  . Panendoscopy  10/27/1999  . Stress cardiolite  12/14/2003  . Electrocardiogram  05/17/2006  . Abdominal hysterectomy      History   Social History  . Marital Status: Married    Spouse Name: N/A    Number of Children: N/A  . Years of Education: N/A   Occupational History  . Retired    Social History Main Topics  . Smoking status: Never Smoker   . Smokeless tobacco: Never Used  . Alcohol Use: No  . Drug Use: No  . Sexual Activity: No   Other Topics Concern  . Not on file   Social History Narrative  . No narrative on file    Current Outpatient Prescriptions on File Prior to Visit  Medication Sig Dispense Refill  . ACCU-CHEK SMARTVIEW test strip        . aspirin EC 325 MG tablet       . atorvastatin (LIPITOR) 40 MG tablet Take 40 mg by mouth daily.      Marland Kitchen BESIVANCE 0.6 % SUSP       . Blood Glucose Calibration (ACCU-CHEK AVIVA) SOLN       . carvedilol (COREG) 3.125 MG tablet Take 3.125 mg by mouth 2 (two) times daily with a meal.      . citalopram (CELEXA) 20 MG tablet Take 20 mg by mouth daily.      . divalproex (DEPAKOTE SPRINKLE) 125 MG capsule Take 250 mg by mouth daily.       Marland Kitchen donepezil (ARICEPT) 5 MG tablet Take 1 tablet (5 mg total) by mouth at bedtime.  30 tablet  11  . Insulin Lispro Prot & Lispro (50-50) 100 UNIT/ML SUPN Inject 20 Units into the skin daily with breakfast. and pen needles 1/day      . Insulin Pen Needle (B-D ULTRAFINE III SHORT PEN) 31G X 8 MM MISC Use 2 per day.  100 each  6  . Lancet Devices (ADVOCATE LANCING DEVICE) MISC       . lisinopril (PRINIVIL,ZESTRIL) 2.5 MG tablet TAKE 1 TABLET (2.5 MG TOTAL) BY MOUTH DAILY.  30 tablet  3  .  PHARMACIST CHOICE LANCETS MISC        No current facility-administered medications on file prior to visit.    Allergies  Allergen Reactions  . Aspirin Nausea Only  . Codeine Nausea Only    Family History  Problem Relation Age of Onset  . Alcohol abuse Son   . Cancer Neg Hx     no cancer in immediate family  . CAD Father     BP 132/86  Pulse 78  Temp(Src) 98.5 F (36.9 C) (Oral)  Ht  (1.6 m)  Wt 172 lb (78.019 kg)  BMI 30.48 kg/m2  SpO2 98%  Review of Systems Denies fever and BRBPR    Objective:   Physical Exam VITAL SIGNS:  See vs page GENERAL: no distress.  In wheelchair. ABDOMEN: abdomen is soft, nontender.  no hepatosplenomegaly. not distended.  no hernia   Lab Results  Component Value Date   CREATININE 0.7 04/04/2014   BUN 13 04/04/2014   NA 139 04/04/2014   K 3.8 04/04/2014   CL 99 04/04/2014   CO2 33* 04/04/2014   Lab Results  Component Value Date   WBC 8.2 04/04/2014   HGB 10.0* 04/04/2014   HCT 30.7* 04/04/2014   MCV 84.6 04/04/2014    PLT 315.0 04/04/2014   Lab Results  Component Value Date   HGBA1C 7.6* 04/04/2014      Assessment & Plan:  DM: improved control. Anemia, worse. Hypokalemia: no rx needed now. CHF: no rx needed now, prob due to diarrhea. Diarrhea, uncertain etiology, improved but not resolved.  Patient is advised the following: Patient Instructions  blood tests are being requested for you today.  We'll contact you with results. For now, please stay off the furosemide and potassium.   Please reduce insulin to 20 units with breakfast, and none in the evening.   Please come back for a follow-up appointment in 3-4 weeks.   i have sent a prescription to your pharmacy, for an antibiotic to take on a trial basis, for the diarrhea.     (take fe, 1-BID)

## 2014-04-04 NOTE — Patient Instructions (Addendum)
blood tests are being requested for you today.  We'll contact you with results. For now, please stay off the furosemide and potassium.   Please reduce insulin to 20 units with breakfast, and none in the evening.   Please come back for a follow-up appointment in 3-4 weeks.   i have sent a prescription to your pharmacy, for an antibiotic to take on a trial basis, for the diarrhea.

## 2014-04-05 ENCOUNTER — Other Ambulatory Visit: Payer: Self-pay | Admitting: Endocrinology

## 2014-04-05 LAB — CBC WITH DIFFERENTIAL/PLATELET
BASOS PCT: 0.4 % (ref 0.0–3.0)
Basophils Absolute: 0 10*3/uL (ref 0.0–0.1)
EOS PCT: 0.9 % (ref 0.0–5.0)
Eosinophils Absolute: 0.1 10*3/uL (ref 0.0–0.7)
HEMATOCRIT: 30.7 % — AB (ref 36.0–46.0)
HEMOGLOBIN: 10 g/dL — AB (ref 12.0–15.0)
LYMPHS ABS: 0.8 10*3/uL (ref 0.7–4.0)
Lymphocytes Relative: 10.4 % — ABNORMAL LOW (ref 12.0–46.0)
MCHC: 32.7 g/dL (ref 30.0–36.0)
MCV: 84.6 fl (ref 78.0–100.0)
MONOS PCT: 9.8 % (ref 3.0–12.0)
Monocytes Absolute: 0.8 10*3/uL (ref 0.1–1.0)
NEUTROS ABS: 6.4 10*3/uL (ref 1.4–7.7)
Neutrophils Relative %: 78.5 % — ABNORMAL HIGH (ref 43.0–77.0)
PLATELETS: 315 10*3/uL (ref 150.0–400.0)
RBC: 3.63 Mil/uL — AB (ref 3.87–5.11)
RDW: 14.8 % (ref 11.5–15.5)
WBC: 8.2 10*3/uL (ref 4.0–10.5)

## 2014-04-05 LAB — BASIC METABOLIC PANEL
BUN: 13 mg/dL (ref 6–23)
CHLORIDE: 99 meq/L (ref 96–112)
CO2: 33 mEq/L — ABNORMAL HIGH (ref 19–32)
Calcium: 8.3 mg/dL — ABNORMAL LOW (ref 8.4–10.5)
Creatinine, Ser: 0.7 mg/dL (ref 0.4–1.2)
GFR: 111.87 mL/min (ref >60.00)
Glucose, Bld: 221 mg/dL — ABNORMAL HIGH (ref 70–99)
POTASSIUM: 3.8 meq/L (ref 3.5–5.1)
SODIUM: 139 meq/L (ref 135–145)

## 2014-04-05 LAB — HEMOGLOBIN A1C: Hgb A1c MFr Bld: 7.6 % — ABNORMAL HIGH (ref 4.6–6.5)

## 2014-04-05 LAB — IBC PANEL
Iron: 17 ug/dL — ABNORMAL LOW (ref 42–145)
Saturation Ratios: 7.9 % — ABNORMAL LOW (ref 20.0–50.0)
Transferrin: 153.2 mg/dL — ABNORMAL LOW (ref 212.0–360.0)

## 2014-04-06 NOTE — Telephone Encounter (Signed)
Ok to refill medication? Med is listed under historical provider. Thanks!

## 2014-04-09 ENCOUNTER — Telehealth: Payer: Self-pay | Admitting: Endocrinology

## 2014-04-09 NOTE — Telephone Encounter (Signed)
See below, Thanks!  

## 2014-04-09 NOTE — Telephone Encounter (Signed)
Rosey Bath stated that patient is seeing a PT OT, home health care nurse and social worker,  just wanted let Dr Everardo All know, she didn't give me a cal back number.

## 2014-04-19 DIAGNOSIS — F0391 Unspecified dementia with behavioral disturbance: Secondary | ICD-10-CM

## 2014-04-19 DIAGNOSIS — M6281 Muscle weakness (generalized): Secondary | ICD-10-CM

## 2014-04-19 DIAGNOSIS — S70219A Abrasion, unspecified hip, initial encounter: Secondary | ICD-10-CM

## 2014-04-19 DIAGNOSIS — F419 Anxiety disorder, unspecified: Secondary | ICD-10-CM

## 2014-04-24 ENCOUNTER — Telehealth: Payer: Self-pay | Admitting: Endocrinology

## 2014-04-24 NOTE — Telephone Encounter (Signed)
Dana [name] from [facility] OT wanting order   1x week for 2 weeks training in home exercise and cognitive program transfers incontinence training heart failure management   Please advise    Thank you

## 2014-05-02 ENCOUNTER — Other Ambulatory Visit: Payer: Self-pay | Admitting: Endocrinology

## 2014-05-02 ENCOUNTER — Ambulatory Visit (INDEPENDENT_AMBULATORY_CARE_PROVIDER_SITE_OTHER): Payer: Medicare PPO | Admitting: Endocrinology

## 2014-05-02 ENCOUNTER — Encounter: Payer: Self-pay | Admitting: Endocrinology

## 2014-05-02 VITALS — BP 118/70 | HR 74 | Temp 98.9°F | Ht 63.0 in | Wt 162.0 lb

## 2014-05-02 DIAGNOSIS — E109 Type 1 diabetes mellitus without complications: Secondary | ICD-10-CM

## 2014-05-02 MED ORDER — FUROSEMIDE 40 MG PO TABS: ORAL_TABLET | ORAL | Status: DC

## 2014-05-02 MED ORDER — POTASSIUM CHLORIDE CRYS ER 20 MEQ PO TBCR: EXTENDED_RELEASE_TABLET | ORAL | Status: DC

## 2014-05-02 NOTE — Progress Notes (Signed)
Subjective:    Patient ID: Dana [name], female    DOB: 01/26/1946, 68 y.o.   MRN: [id number]  HPI history is from pt and husband.   The state of at least three ongoing medical problems is addressed today, with interval history of each noted here: Diarrhea: is almost completely resolved. CHF: she is still off lasix; she denies sob.   Pt returns for f/u of diabetes mellitus: DM type: 2 Dx'ed: 1987 Complications: none Therapy: insulin  GDM: never DKA: never Severe hypoglycemia: never Pancreatitis: never Other: therapy has been limited by severe noncompliance and dementia; husband gives her insulin injections (she therefore needs a simple qd insulin regimen), and he also checks cbg's; she was admitted in march of 2014, with severe hyperglycemia. Interval history: no cbg record, but states cbg was 34 once, in the middle of the night.  She has lost a few more lbs.   Past Medical History  Diagnosis Date  . ALLERGIC RHINITIS 02/08/2007  . ANXIETY 02/08/2007  . Cough 08/15/2008  . DEPRESSION 02/08/2007  . DIABETES MELLITUS, TYPE I 02/08/2007  . Headache(784.0) 11/06/2008  . HYPERCHOLESTEROLEMIA 11/05/2008  . HYPERTENSION 02/08/2007  . URINARY INCONTINENCE 02/08/2007  . Dyslipidemia   . Non-ulcer dyspepsia   . Leukopenia   . DM retinopathy     background  . 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 Dementia   . CHF (congestive heart failure)     Past Surgical History  Procedure Laterality Date  . Total knee arthroplasty  1990    left  . Panendoscopy  10/27/1999  . Stress cardiolite  12/14/2003  . Electrocardiogram  05/17/2006  . Abdominal hysterectomy      History   Social History  . Marital Status: Married    Spouse Name: N/A    Number of Children: N/A  . Years of Education: N/A   Occupational History  . Retired    Social History Main Topics  . Smoking status: Never Smoker   . Smokeless tobacco: Never Used  . Alcohol Use: No  . Drug Use: No  . Sexual  Activity: No   Other Topics Concern  . Not on file   Social History Narrative  . No narrative on file    Current Outpatient Prescriptions on File Prior to Visit  Medication Sig Dispense Refill  . ACCU-CHEK SMARTVIEW test strip       . aspirin EC 325 MG tablet       . atorvastatin (LIPITOR) 40 MG tablet Take 40 mg by mouth daily.      Marland Kitchen. BESIVANCE 0.6 % SUSP       . Blood Glucose Calibration (ACCU-CHEK AVIVA) SOLN       . citalopram (CELEXA) 20 MG tablet Take 20 mg by mouth daily.      . divalproex (DEPAKOTE SPRINKLE) 125 MG capsule Take 250 mg by mouth daily.       Marland Kitchen. donepezil (ARICEPT) 5 MG tablet Take 1 tablet (5 mg total) by mouth at bedtime.  30 tablet  11  . Insulin Lispro Prot & Lispro (50-50) 100 UNIT/ML SUPN Inject 15 Units into the skin daily with breakfast. and pen needles 1/day      . Insulin Pen Needle (B-D ULTRAFINE III SHORT PEN) 31G X 8 MM MISC Use 2 per day.  100 each  6  . Lancet Devices (ADVOCATE LANCING DEVICE) MISC       . lisinopril (PRINIVIL,ZESTRIL) 2.5 MG tablet TAKE  1 TABLET (2.5 MG TOTAL) BY MOUTH DAILY.  30 tablet  3  . PHARMACIST CHOICE LANCETS MISC        No current facility-administered medications on file prior to visit.    Allergies  Allergen Reactions  . Aspirin Nausea Only  . Codeine Nausea Only    Family History  Problem Relation Age of Onset  . Alcohol abuse Son   . Cancer Neg Hx     no cancer in immediate family  . CAD Father     BP 118/70  Pulse 74  Temp(Src) 98.9 F (37.2 C) (Oral)  Ht 5\' 3"  (1.6 m)  Wt 162 lb (73.483 kg)  BMI 28.70 kg/m2  SpO2 97%    Review of Systems Denies LOC and muscle cramps.  She has slight dizziness.     Objective:   Physical Exam VITAL SIGNS:  See vs page. GENERAL: no distress.  In wheelchair.   LUNGS:  Clear to auscultation.         Assessment & Plan:  DM: overcontrolled Diarrhea, uncertain etiology, much better now.  CHF: she needs to resume diuretic rx Weight loss: this is leading  to symptomatic overcontrol of HTN  Patient is advised the following: Patient Instructions  Please come back for a follow-up appointment in 6 weeks.   Please resume the furosemide (2 pills daily), and potassium. Please reduce the insulin to 15 units daily, with breakfast.  Please stop taking the carvedilol.

## 2014-05-02 NOTE — Patient Instructions (Addendum)
Please come back for a follow-up appointment in 6 weeks.   Please resume the furosemide (2 pills daily), and potassium. Please reduce the insulin to 15 units daily, with breakfast.  Please stop taking the carvedilol.

## 2014-05-09 ENCOUNTER — Telehealth: Payer: Self-pay | Admitting: Endocrinology

## 2014-05-09 NOTE — Telephone Encounter (Signed)
[facility] needs order for transport chair Gie called someone just needs to contact the patient family to pick up the order

## 2014-05-10 NOTE — Telephone Encounter (Signed)
Pt is requesting a prescription for a transport chair to be printed out so the husband can come by and pick the prescription up. [facility] so [facility] could not [facility] chair. Once the prescription is picked up the husband will take it to a local supplying company.  Please advise, Thanks!

## 2014-05-11 NOTE — Telephone Encounter (Signed)
done

## 2014-05-11 NOTE — Telephone Encounter (Signed)
Pt's husband advised that rx for pt's transport chair is ready. Rx placed upfront.

## 2014-05-14 ENCOUNTER — Ambulatory Visit (INDEPENDENT_AMBULATORY_CARE_PROVIDER_SITE_OTHER): Payer: Commercial Managed Care - HMO | Admitting: Ophthalmology

## 2014-05-22 ENCOUNTER — Telehealth: Payer: Self-pay | Admitting: Endocrinology

## 2014-05-22 ENCOUNTER — Other Ambulatory Visit: Payer: Self-pay | Admitting: Endocrinology

## 2014-05-22 NOTE — Telephone Encounter (Signed)
Patient's husband called and would like her Humalog 50/50 quick pen  Please fill today as she will be out in the am   Pharm[facility]: [facility]  Thank you

## 2014-05-22 NOTE — Telephone Encounter (Signed)
Rx sent to pharmacy per pt's request.  

## 2014-05-23 ENCOUNTER — Ambulatory Visit (INDEPENDENT_AMBULATORY_CARE_PROVIDER_SITE_OTHER): Payer: Commercial Managed Care - HMO | Admitting: Gastroenterology

## 2014-05-23 ENCOUNTER — Encounter: Payer: Self-pay | Admitting: Gastroenterology

## 2014-05-23 VITALS — BP 168/90 | HR 82 | Ht 63.0 in | Wt 166.0 lb

## 2014-05-23 DIAGNOSIS — R197 Diarrhea, unspecified: Secondary | ICD-10-CM

## 2014-05-23 DIAGNOSIS — Z1211 Encounter for screening for malignant neoplasm of colon: Secondary | ICD-10-CM

## 2014-05-23 MED ORDER — MOVIPREP 100 G PO SOLR: 1.0000 | Freq: Once | ORAL | Status: DC

## 2014-05-23 NOTE — Progress Notes (Signed)
HPI: This is a  Very pleasant89 year old woman who is here with her husband today. I am meeting her for the first time. She is in a wheelchair. She has some obvious memory difficulties due to Alzheimer's dementia. She is a fair historian overall. Her husband tries to fill in the gaps as best as possible.     Referred for diarrhea, Dr. Everardo AllEllison.  Occurred about 2 moths ago, last several weeks.  Non-bloody.  Very loose; twice daily.  Never nocturnal symptoms.  Was put on a medicine for diarrhea, prescription, he does  Husband says he had loose stools too.  Her diarrhea has resolvedAnd for the past few weeks she has really been back to nearly normal per her husband.  She had a colonoscopy about 15 years ago but no colon cancer screening since then.  Review of systems: Pertinent positive and negative review of systems were noted in the above HPI section. Complete review of systems was performed and was otherwise normal.    Past Medical History  Diagnosis Date  . ALLERGIC RHINITIS 02/08/2007  . ANXIETY 02/08/2007  . Cough 08/15/2008  . DEPRESSION 02/08/2007  . DIABETES MELLITUS, TYPE I 02/08/2007  . Headache(784.0) 11/06/2008  . HYPERCHOLESTEROLEMIA 11/05/2008  . HYPERTENSION 02/08/2007  . URINARY INCONTINENCE 02/08/2007  . Dyslipidemia   . Non-ulcer dyspepsia   . Leukopenia   . DM retinopathy     background  . 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 Dementia   . CHF (congestive heart failure)     Past Surgical History  Procedure Laterality Date  . Total knee arthroplasty  1990    left  . Panendoscopy  10/27/1999  . Stress cardiolite  12/14/2003  . Electrocardiogram  05/17/2006  . Abdominal hysterectomy      Current Outpatient Prescriptions  Medication Sig Dispense Refill  . ACCU-CHEK SMARTVIEW test strip     . aspirin EC 325 MG tablet     . atorvastatin (LIPITOR) 40 MG tablet Take 40 mg by mouth daily.    . Blood Glucose Calibration (ACCU-CHEK AVIVA) SOLN     .  citalopram (CELEXA) 20 MG tablet Take 20 mg by mouth daily.    . divalproex (DEPAKOTE SPRINKLE) 125 MG capsule Take 250 mg by mouth daily.     Marland Kitchen. donepezil (ARICEPT) 5 MG tablet Take 1 tablet (5 mg total) by mouth at bedtime. 30 tablet 11  . ferrous sulfate 325 (65 FE) MG tablet Take 325 mg by mouth daily with breakfast.    . furosemide (LASIX) 40 MG tablet Take 80 mg by mouth daily.    Marland Kitchen. HUMALOG MIX 50/50 KWIKPEN (50-50) 100 UNIT/ML Kwikpen INJECT 25 UNITS INTO THE SKIN DAILY WITH BREAKFAST. AND PEN NEEDLES 1/DAY 15 mL 1  . Insulin Pen Needle (B-D ULTRAFINE III SHORT PEN) 31G X 8 MM MISC Use 2 per day. 100 each 6  . Lancet Devices (ADVOCATE LANCING DEVICE) MISC     . lisinopril (PRINIVIL,ZESTRIL) 2.5 MG tablet TAKE 1 TABLET (2.5 MG TOTAL) BY MOUTH DAILY. 30 tablet 3  . mirtazapine (REMERON) 30 MG tablet     . PHARMACIST CHOICE LANCETS MISC      No current facility-administered medications for this visit.    Allergies as of 05/23/2014 - Review Complete 05/23/2014  Allergen Reaction Noted  . Aspirin Nausea Only   . Codeine Nausea Only 08/15/2008    Family History  Problem Relation Age of Onset  . Alcohol abuse Son   .  Cancer Neg Hx     no cancer in immediate family  . CAD Father     History   Social History  . Marital Status: Married    Spouse Name: N/A    Number of Children: N/A  . Years of Education: N/A   Occupational History  . Retired    Social History Main Topics  . Smoking status: Never Smoker   . Smokeless tobacco: Never Used  . Alcohol Use: No  . Drug Use: No  . Sexual Activity: No   Other Topics Concern  . Not on file   Social History Narrative       Physical Exam: BP 168/90 mmHg  Pulse 82  Ht 5' 3" (1.6 m)  Wt 166 lb (75.297 kg)  BMI 29.41 kg/m2 Constitutional: generally well-appearing, sits in a wheelchair Psychiatric: alert and oriented x3, minor memory difficulties Eyes: extraocular movements intact Mouth: oral pharynx moist, no  lesions Neck: supple no lymphadenopathy Cardiovascular: heart regular rate and rhythm Lungs: clear to auscultation bilaterally Abdomen: soft, nontender, nondistended, no obvious ascites, no peritoneal signs, normal bowel sounds Extremities: no lower extremity edema bilaterally Skin: no lesions on visible extremities    Assessment and plan: 68 y.o. female with  Recent diarrhea that has since resolved, routine risk for colon cancer  She is feeling better overall. Perhaps she had acute infectious diarrhea. Aricept can contribute to diarrhea as well, her husband is not really sure when she started that. Either way her symptoms have really improved and I recommended that if they return she should try Imodium periodically. She has not had colon cancer screening in 15 years and I recommended we proceed with colonoscopy at her soonest convenience.     

## 2014-05-23 NOTE — Patient Instructions (Signed)
Can try imodium if diarrhea returns. You will be set up for a colonoscopy for colon cancer screening ([facility], MAC sedation).

## 2014-05-28 ENCOUNTER — Ambulatory Visit (INDEPENDENT_AMBULATORY_CARE_PROVIDER_SITE_OTHER): Payer: Commercial Managed Care - HMO | Admitting: Ophthalmology

## 2014-05-28 DIAGNOSIS — E11311 Type 2 diabetes mellitus with unspecified diabetic retinopathy with macular edema: Secondary | ICD-10-CM

## 2014-05-28 DIAGNOSIS — E11331 Type 2 diabetes mellitus with moderate nonproliferative diabetic retinopathy with macular edema: Secondary | ICD-10-CM

## 2014-05-28 DIAGNOSIS — H43813 Vitreous degeneration, bilateral: Secondary | ICD-10-CM

## 2014-05-30 LAB — HM DIABETES EYE EXAM

## 2014-06-01 ENCOUNTER — Encounter: Payer: Self-pay | Admitting: Endocrinology

## 2014-06-02 ENCOUNTER — Other Ambulatory Visit: Payer: Self-pay | Admitting: Endocrinology

## 2014-06-04 ENCOUNTER — Other Ambulatory Visit: Payer: Self-pay | Admitting: Cardiovascular Disease

## 2014-06-04 NOTE — Telephone Encounter (Signed)
Please advise if ok to refill rx. Medication is not on current med list.  Thanks!  

## 2014-06-07 ENCOUNTER — Encounter (HOSPITAL_COMMUNITY): Payer: Self-pay | Admitting: *Deleted

## 2014-06-11 ENCOUNTER — Other Ambulatory Visit: Payer: Self-pay

## 2014-06-11 ENCOUNTER — Emergency Department (HOSPITAL_COMMUNITY)
Admission: EM | Admit: 2014-06-11 | Discharge: 2014-06-11 | Disposition: A | Discharge status: Home / Self Care | Payer: Medicare PPO | Attending: Emergency Medicine | Admitting: Emergency Medicine

## 2014-06-11 ENCOUNTER — Encounter (HOSPITAL_COMMUNITY): Payer: Self-pay | Admitting: Family Medicine

## 2014-06-11 ENCOUNTER — Emergency Department (HOSPITAL_COMMUNITY): Payer: Medicare PPO

## 2014-06-11 ENCOUNTER — Telehealth: Payer: Self-pay | Admitting: Endocrinology

## 2014-06-11 DIAGNOSIS — I10 Essential (primary) hypertension: Secondary | ICD-10-CM | POA: Insufficient documentation

## 2014-06-11 DIAGNOSIS — K529 Noninfective gastroenteritis and colitis, unspecified: Secondary | ICD-10-CM

## 2014-06-11 DIAGNOSIS — Z8709 Personal history of other diseases of the respiratory system: Secondary | ICD-10-CM | POA: Diagnosis not present

## 2014-06-11 DIAGNOSIS — Z8719 Personal history of other diseases of the digestive system: Secondary | ICD-10-CM | POA: Insufficient documentation

## 2014-06-11 DIAGNOSIS — Z794 Long term (current) use of insulin: Secondary | ICD-10-CM | POA: Diagnosis not present

## 2014-06-11 DIAGNOSIS — E785 Hyperlipidemia, unspecified: Secondary | ICD-10-CM | POA: Diagnosis not present

## 2014-06-11 DIAGNOSIS — F419 Anxiety disorder, unspecified: Secondary | ICD-10-CM | POA: Diagnosis not present

## 2014-06-11 DIAGNOSIS — R011 Cardiac murmur, unspecified: Secondary | ICD-10-CM | POA: Insufficient documentation

## 2014-06-11 DIAGNOSIS — R110 Nausea: Secondary | ICD-10-CM | POA: Diagnosis not present

## 2014-06-11 DIAGNOSIS — R197 Diarrhea, unspecified: Secondary | ICD-10-CM | POA: Diagnosis not present

## 2014-06-11 DIAGNOSIS — E780 Pure hypercholesterolemia: Secondary | ICD-10-CM | POA: Diagnosis not present

## 2014-06-11 DIAGNOSIS — I509 Heart failure, unspecified: Secondary | ICD-10-CM | POA: Diagnosis not present

## 2014-06-11 DIAGNOSIS — E109 Type 1 diabetes mellitus without complications: Secondary | ICD-10-CM | POA: Diagnosis not present

## 2014-06-11 DIAGNOSIS — D649 Anemia, unspecified: Secondary | ICD-10-CM | POA: Insufficient documentation

## 2014-06-11 DIAGNOSIS — F0390 Unspecified dementia without behavioral disturbance: Secondary | ICD-10-CM | POA: Insufficient documentation

## 2014-06-11 DIAGNOSIS — Z79899 Other long term (current) drug therapy: Secondary | ICD-10-CM | POA: Insufficient documentation

## 2014-06-11 DIAGNOSIS — F329 Major depressive disorder, single episode, unspecified: Secondary | ICD-10-CM | POA: Diagnosis not present

## 2014-06-11 DIAGNOSIS — M549 Dorsalgia, unspecified: Secondary | ICD-10-CM

## 2014-06-11 DIAGNOSIS — M1990 Unspecified osteoarthritis, unspecified site: Secondary | ICD-10-CM | POA: Diagnosis not present

## 2014-06-11 DIAGNOSIS — Z7982 Long term (current) use of aspirin: Secondary | ICD-10-CM | POA: Diagnosis not present

## 2014-06-11 DIAGNOSIS — Z872 Personal history of diseases of the skin and subcutaneous tissue: Secondary | ICD-10-CM | POA: Diagnosis not present

## 2014-06-11 LAB — COMPREHENSIVE METABOLIC PANEL
ALK PHOS: 76 U/L (ref 39–117)
ALT: 11 U/L (ref 0–35)
AST: 12 U/L (ref 0–37)
Albumin: 3.3 g/dL — ABNORMAL LOW (ref 3.5–5.2)
Anion gap: 13 (ref 5–15)
BUN: 17 mg/dL (ref 6–23)
CO2: 29 meq/L (ref 19–32)
Calcium: 9.2 mg/dL (ref 8.4–10.5)
Chloride: 98 mEq/L (ref 96–112)
Creatinine, Ser: 0.68 mg/dL (ref 0.50–1.10)
GFR, EST NON AFRICAN AMERICAN: 87 mL/min — AB (ref >90)
GLUCOSE: 167 mg/dL — AB (ref 70–99)
POTASSIUM: 3.9 meq/L (ref 3.7–5.3)
Sodium: 140 mEq/L (ref 137–147)
Total Bilirubin: 0.7 mg/dL (ref 0.3–1.2)
Total Protein: 7.2 g/dL (ref 6.0–8.3)

## 2014-06-11 LAB — TYPE AND SCREEN
ABO/RH(D): A POS
Antibody Screen: NEGATIVE

## 2014-06-11 LAB — URINE MICROSCOPIC-ADD ON

## 2014-06-11 LAB — CBC
HEMATOCRIT: 35.8 % — AB (ref 36.0–46.0)
Hemoglobin: 11.5 g/dL — ABNORMAL LOW (ref 12.0–15.0)
MCH: 26.4 pg (ref 26.0–34.0)
MCHC: 32.1 g/dL (ref 30.0–36.0)
MCV: 82.1 fL (ref 78.0–100.0)
Platelets: 233 10*3/uL (ref 150–400)
RBC: 4.36 MIL/uL (ref 3.87–5.11)
RDW: 16.9 % — ABNORMAL HIGH (ref 11.5–15.5)
WBC: 4.2 10*3/uL (ref 4.0–10.5)

## 2014-06-11 LAB — URINALYSIS, ROUTINE W REFLEX MICROSCOPIC
Bilirubin Urine: NEGATIVE
GLUCOSE, UA: NEGATIVE mg/dL
Ketones, ur: NEGATIVE mg/dL
Nitrite: NEGATIVE
PH: 5 (ref 5.0–8.0)
Protein, ur: NEGATIVE mg/dL
SPECIFIC GRAVITY, URINE: 1.013 (ref 1.005–1.030)
Urobilinogen, UA: 0.2 mg/dL (ref 0.0–1.0)

## 2014-06-11 LAB — ABO/RH: ABO/RH(D): A POS

## 2014-06-11 LAB — POC OCCULT BLOOD, ED: Fecal Occult Bld: NEGATIVE

## 2014-06-11 LAB — TROPONIN I: Troponin I: <0.3 ng/mL (ref <0.30)

## 2014-06-11 NOTE — Telephone Encounter (Signed)
Go to ER now

## 2014-06-11 NOTE — ED Provider Notes (Signed)
CSN: [id number]     Arrival date & time 06/11/14  1531 History   First MD Initiated Contact with Patient 06/11/14 1601     Chief Complaint  Patient presents with  . Back Pain  . Rectal Bleeding     (Consider location/radiation/quality/duration/timing/severity/associated sxs/prior Treatment) HPI  This is a 68 year old female with a history of dementia, diabetes, hypercholesterolemia, hypertension, CHF who presents with back pain and dark stools. Patient is generally a poor historian. Husband is at bedside providing additional information. Patient states "I'm here for everything." Husband reports 3 month history of diarrhea. She has been followed by Dr. Loanne Drilling and Dr. Ardis Hughs. Husband reports that she has had more dark schools recently. Patient denies any vomiting or nausea. She denies any shortness of breath or chest pain. She denies any abdominal pain. Husband reports that the patient appears dizzy. Patient is unable to tell me whether she feels off balance or room spinning dizziness. She denies any headache.  Level V caveat for altered mental status  Past Medical History  Diagnosis Date  . ALLERGIC RHINITIS 02/08/2007  . ANXIETY 02/08/2007  . DEPRESSION 02/08/2007  . DIABETES MELLITUS, TYPE I 02/08/2007  . Headache(784.0) 11/06/2008  . HYPERCHOLESTEROLEMIA 11/05/2008  . HYPERTENSION 02/08/2007  . URINARY INCONTINENCE 02/08/2007  . Dyslipidemia   . Non-ulcer dyspepsia   . Leukopenia   . DM retinopathy     background  . 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 Dementia   . CHF (congestive heart failure)   . Anemia     takes iron  . Skin abnormality     both knees with opne ares and left shoulder with open areas, changes dressings prn   Past Surgical History  Procedure Laterality Date  . Total knee arthroplasty  1990    left  . Panendoscopy  10/27/1999  . Stress cardiolite  12/14/2003  . Electrocardiogram  05/17/2006  . Abdominal hysterectomy    . Bladder tach  years ago    Family History  Problem Relation Age of Onset  . Alcohol abuse Son   . Cancer Neg Hx     no cancer in immediate family  . CAD Father    History  Substance Use Topics  . Smoking status: Never Smoker   . Smokeless tobacco: Never Used  . Alcohol Use: No   OB History    No data available     Review of Systems  Constitutional: Negative for fever.  Respiratory: Negative for chest tightness and shortness of breath.   Cardiovascular: Negative for chest pain.  Gastrointestinal: Positive for nausea. Negative for vomiting and abdominal pain.       Dark stools  Genitourinary: Negative for dysuria.  Musculoskeletal: Positive for back pain.  Skin: Positive for wound.  Neurological: Positive for dizziness. Negative for headaches.  Psychiatric/Behavioral: Positive for confusion.  All other systems reviewed and are negative.     Allergies  Aspirin and Codeine  Home Medications   Prior to Admission medications   Medication Sig Start Date End Date Taking? Authorizing Provider  aspirin EC 325 MG tablet TAKE 1 TABLET (325 MG TOTAL) BY MOUTH DAILY. 06/05/14  Yes Burnell Blanks, MD  atorvastatin (LIPITOR) 40 MG tablet Take 40 mg by mouth every morning.    Yes Historical Provider, MD  citalopram (CELEXA) 20 MG tablet Take 20 mg by mouth every morning.    Yes Historical Provider, MD  divalproex (DEPAKOTE) 250 MG DR tablet Take 250 mg  by mouth every morning.   Yes Historical Provider, MD  donepezil (ARICEPT) 10 MG tablet Take 10 mg by mouth at bedtime.   Yes Historical Provider, MD  ferrous sulfate 325 (65 FE) MG tablet Take 325 mg by mouth daily with breakfast.   Yes Historical Provider, MD  furosemide (LASIX) 40 MG tablet Take 40 mg by mouth every morning.    Yes Historical Provider, MD  Insulin Lispro Prot & Lispro (HUMALOG MIX 50/50 KWIKPEN) (50-50) 100 UNIT/ML Kwikpen Inject 20 Units into the skin every morning.   Yes Historical Provider, MD  lisinopril (PRINIVIL,ZESTRIL) 2.5  MG tablet Take 2.5 mg by mouth every morning.   Yes Historical Provider, MD  Propylene Glycol (SYSTANE BALANCE OP) Apply 1 drop to eye 3 (three) times daily as needed (dry eyes).   Yes Historical Provider, MD  sodium chloride (OCEAN) 0.65 % SOLN nasal spray Place 2 sprays into both nostrils as needed for congestion.   Yes Historical Provider, MD  ACCU-CHEK SMARTVIEW test strip 1 each by Other route 2 (two) times daily.  11/25/13   Historical Provider, MD  aspirin EC 325 MG tablet Take 325 mg by mouth every morning.    Historical Provider, MD  carvedilol (COREG) 3.125 MG tablet TAKE 1 TABLET BY MOUTH 2 (TWO) TIMES DAILY WITH A MEAL. Patient not taking: Reported on 06/11/2014 06/04/14   Renato Shin, MD  donepezil (ARICEPT) 5 MG tablet Take 1 tablet (5 mg total) by mouth at bedtime. Patient not taking: Reported on 06/11/2014 01/31/14   Renato Shin, MD  HUMALOG MIX 50/50 KWIKPEN (50-50) 100 UNIT/ML Kwikpen INJECT 25 UNITS INTO THE SKIN DAILY WITH BREAKFAST. AND PEN NEEDLES 1/DAY Patient not taking: Reported on 06/08/2014 05/22/14   Renato Shin, MD  Insulin Pen Needle (B-D ULTRAFINE III SHORT PEN) 31G X 8 MM MISC Use 2 per day. Patient taking differently: 1 each by Other route 2 (two) times daily. Use 2 per day. 01/18/14   Renato Shin, MD  Lancet Devices (ADVOCATE LANCING DEVICE) MISC 1 each by Other route 2 (two) times daily.  09/11/13   Historical Provider, MD  lisinopril (PRINIVIL,ZESTRIL) 2.5 MG tablet TAKE 1 TABLET (2.5 MG TOTAL) BY MOUTH DAILY. Patient not taking: Reported on 06/08/2014    Burnell Blanks, MD  MOVIPREP 100 G SOLR Take 1 kit (200 g total) by mouth once. Patient not taking: Reported on 06/11/2014 05/23/14   Milus Banister, MD   BP 147/56 mmHg  Pulse 65  Temp(Src) 98.2 F (36.8 C) (Oral)  Resp 17  Ht 5' 3" (1.6 m)  Wt 160 lb (72.576 kg)  BMI 28.35 kg/m2  SpO2 100% Physical Exam  Constitutional: No distress.  Elderly  HENT:  Head: Normocephalic and atraumatic.   Eyes: EOM are normal. Pupils are equal, round, and reactive to light.  Cardiovascular: Normal rate and regular rhythm.   Murmur heard. Pulmonary/Chest: Effort normal and breath sounds normal. No respiratory distress. She has no wheezes.  Abdominal: Soft. Bowel sounds are normal. There is no tenderness. There is no rebound.  Genitourinary:  Normal rectal tone, dark loose stool in the rectal vault  Musculoskeletal: She exhibits no edema.  Neurological: She is alert.  Confused at times and tangential, oriented 2  Skin: Skin is warm and dry.  Psychiatric: She has a normal mood and affect.  Nursing note and vitals reviewed.   ED Course  Procedures (including critical care time) Labs Review Labs Reviewed  CBC - Abnormal; Notable for the  following:    Hemoglobin 11.5 (*)    HCT 35.8 (*)    RDW 16.9 (*)    All other components within normal limits  COMPREHENSIVE METABOLIC PANEL - Abnormal; Notable for the following:    Glucose, Bld 167 (*)    Albumin 3.3 (*)    GFR calc non Af Amer 87 (*)    All other components within normal limits  URINALYSIS, ROUTINE W REFLEX MICROSCOPIC - Abnormal; Notable for the following:    Hgb urine dipstick SMALL (*)    Leukocytes, UA TRACE (*)    All other components within normal limits  URINE MICROSCOPIC-ADD ON - Abnormal; Notable for the following:    Casts HYALINE CASTS (*)    All other components within normal limits  TROPONIN I  POC OCCULT BLOOD, ED  TYPE AND SCREEN  ABO/RH    Imaging Review Dg Chest 2 View  06/11/2014   CLINICAL DATA:  Diarrhea. Dark stools. Dizziness and weakness. Initial encounter.  EXAM: CHEST  2 VIEW  COMPARISON:  12/15/2013.  FINDINGS: Mild cardiomegaly is present. There is LEFT atrial enlargement evident on the lateral view. Aortic arch atherosclerosis. Monitoring leads project over the chest. The lungs appear clear. No airspace disease or effusion.  IMPRESSION: Cardiomegaly without failure.   Electronically Signed    By: Dereck Ligas M.D.   On: 06/11/2014 18:39     EKG Interpretation EKG independently reviewed by myself: Sinus rhythm with a rate of 66, left anterior fascicular block, Q waves anteriorly noted on prior exam, T-wave flattening laterally    MDM   Final diagnoses:  Back pain  Chronic diarrhea    Patient presents with multiple complaints reported by husband. Unclear what his new today versus chronic. Husband reports chronic diarrhea. Appears dark stools are new. Patient states that she "feels fine." She is oriented 2 but minimally contributory to history taking. Dark stool in the rectal vault but Hemoccult negative.  Denies vomiting.  No obvious UTI. Hemoglobin is stable. Basic labwork including CMP is reassuring. Chest x-ray obtained given reports of nausea and back pain. No signs of widened mediastinum suggestive of aortic dissection and patient is currently without complaint. EKG is reassuring. Discussed results with the husband. At this time given the patient is at her baseline, ambulatory and able to tolerate food, and without obvious emergent medical condition and no active bleeding, will discharge with close PCP follow-up.  After history, exam, and medical workup I feel the patient has been appropriately medically screened and is safe for discharge home. Pertinent diagnoses were discussed with the patient. Patient was given return precautions.   Merryl Hacker, MD 06/11/14 [phone]

## 2014-06-11 NOTE — Telephone Encounter (Signed)
Patient is having severe dizziness, black stool diarrhea, severe lower back pain with  nausea   Please advise patient    Thank you

## 2014-06-11 NOTE — ED Notes (Signed)
Pt here for diarrhea, dark stools, dizziness and weakness. Pt pale at triage. Pt denies pain

## 2014-06-11 NOTE — Discharge Instructions (Signed)
You were seen today for multiple complaints including back pain, dizziness, and diarrhea. You do not appear to be bleeding from her bottom. He did not appear to be dehydrated. All of your lab work including her tests and x-rays are reassuring.  You should follow-up with her primary doctor for ongoing issues regarding diarrhea. If you have any new or worsening symptoms including chest pain, shortness of breath, abdominal pain, urinary symptoms, focal weakness or numbness she should be reevaluated immediately.  Diarrhea Diarrhea is frequent loose and watery bowel movements. It can cause you to feel weak and dehydrated. Dehydration can cause you to become tired and thirsty, have a dry mouth, and have decreased urination that often is dark yellow. Diarrhea is a sign of another problem, most often an infection that will not last long. In most cases, diarrhea typically lasts 2-3 days. However, it can last longer if it is a sign of something more serious. It is important to treat your diarrhea as directed by your caregiver to lessen or prevent future episodes of diarrhea. CAUSES  Some common causes include:  Gastrointestinal infections caused by viruses, bacteria, or parasites.  Food poisoning or food allergies.  Certain medicines, such as antibiotics, chemotherapy, and laxatives.  Artificial sweeteners and fructose.  Digestive disorders. HOME CARE INSTRUCTIONS  Ensure adequate fluid intake (hydration): Have 1 cup (8 oz) of fluid for each diarrhea episode. Avoid fluids that contain simple sugars or sports drinks, fruit juices, whole milk products, and sodas. Your urine should be clear or pale yellow if you are drinking enough fluids. Hydrate with an oral rehydration solution that you can purchase at pharmacies, retail stores, and online. You can prepare an oral rehydration solution at home by mixing the following ingredients together:   - tsp table salt.   tsp baking soda.   tsp salt substitute  containing potassium chloride.  1  tablespoons sugar.  1 L (34 oz) of water.  Certain foods and beverages may increase the speed at which food moves through the gastrointestinal (GI) tract. These foods and beverages should be avoided and include:  Caffeinated and alcoholic beverages.  High-fiber foods, such as raw fruits and vegetables, nuts, seeds, and whole grain breads and cereals.  Foods and beverages sweetened with sugar alcohols, such as xylitol, sorbitol, and mannitol.  Some foods may be well tolerated and may help thicken stool including:  Starchy foods, such as rice, toast, pasta, low-sugar cereal, oatmeal, grits, baked potatoes, crackers, and bagels.  Bananas.  Applesauce.  Add probiotic-rich foods to help increase healthy bacteria in the GI tract, such as yogurt and fermented milk products.  Wash your hands well after each diarrhea episode.  Only take over-the-counter or prescription medicines as directed by your caregiver.  Take a warm bath to relieve any burning or pain from frequent diarrhea episodes. SEEK IMMEDIATE MEDICAL CARE IF:   You are unable to keep fluids down.  You have persistent vomiting.  You have blood in your stool, or your stools are black and tarry.  You do not urinate in 6-8 hours, or there is only a small amount of very dark urine.  You have abdominal pain that increases or localizes.  You have weakness, dizziness, confusion, or light-headedness.  You have a severe headache.  Your diarrhea gets worse or does not get better.  You have a fever or persistent symptoms for more than 2-3 days.  You have a fever and your symptoms suddenly get worse. MAKE SURE YOU:   Understand  these instructions.  Will watch your condition.  Will get help right away if you are not doing well or get worse. Document Released: 06/26/2002 Document Revised: 11/20/2013 Document Reviewed: 03/13/2012 [facility] Patient Information 2015 [place], [place]. This  information is not intended to replace advice given to you by your health care provider. Make sure you discuss any questions you have with your health care provider.

## 2014-06-11 NOTE — Telephone Encounter (Signed)
Pt's husband advised and stated the would go to the ER.

## 2014-06-11 NOTE — ED Notes (Signed)
Pt walked without any complications. Pt stated that she felt fine.

## 2014-06-18 ENCOUNTER — Encounter: Payer: Self-pay | Admitting: Endocrinology

## 2014-06-18 ENCOUNTER — Ambulatory Visit (INDEPENDENT_AMBULATORY_CARE_PROVIDER_SITE_OTHER): Payer: Commercial Managed Care - HMO | Admitting: Endocrinology

## 2014-06-18 VITALS — BP 140/80 | HR 71 | Temp 98.7°F | Ht 63.0 in | Wt 173.0 lb

## 2014-06-18 DIAGNOSIS — Z23 Encounter for immunization: Secondary | ICD-10-CM

## 2014-06-18 DIAGNOSIS — N951 Menopausal and female climacteric states: Secondary | ICD-10-CM

## 2014-06-18 DIAGNOSIS — I5021 Acute systolic (congestive) heart failure: Secondary | ICD-10-CM

## 2014-06-18 DIAGNOSIS — Z0000 Encounter for general adult medical examination without abnormal findings: Secondary | ICD-10-CM

## 2014-06-18 DIAGNOSIS — E876 Hypokalemia: Secondary | ICD-10-CM

## 2014-06-18 DIAGNOSIS — R0600 Dyspnea, unspecified: Secondary | ICD-10-CM

## 2014-06-18 LAB — BASIC METABOLIC PANEL
BUN: 15 mg/dL (ref 6–23)
CALCIUM: 8.7 mg/dL (ref 8.4–10.5)
CO2: 30 meq/L (ref 19–32)
Chloride: 96 mEq/L (ref 96–112)
Creatinine, Ser: 0.8 mg/dL (ref 0.4–1.2)
GFR: 98.16 mL/min (ref >60.00)
Glucose, Bld: 201 mg/dL — ABNORMAL HIGH (ref 70–99)
Potassium: 4.1 mEq/L (ref 3.5–5.1)
SODIUM: 136 meq/L (ref 135–145)

## 2014-06-18 LAB — BRAIN NATRIURETIC PEPTIDE: Pro B Natriuretic peptide (BNP): 148 pg/mL — ABNORMAL HIGH (ref 0.0–100.0)

## 2014-06-18 NOTE — Progress Notes (Signed)
Subjective:    Patient ID: Dana [name], female    DOB: [birth year], 68 y.o.   MRN: [id number]  HPI history is from pt and husband.   The state of at least three ongoing medical problems is addressed today, with interval history of each noted here: Diarrhea: is resolved. CHF: back on the lasix, she has slight sob.   Pt returns for f/u of diabetes mellitus:  DM type: 2 Dx'ed: 2229 Complications: none Therapy: insulin  GDM: never DKA: never Severe hypoglycemia: never Pancreatitis: never Other: therapy has been limited by dementia; husband gives her insulin injections (she therefore needs a simple qd insulin regimen), and he also checks cbg's; she was admitted in march of 2014, with severe hyperglycemia. Interval history: no cbg record, but states cbg varies from 127-250.  Husband increased insulin to 20 am and 10 pm.  She denies hypoglycemia.   Past Medical History  Diagnosis Date  . ALLERGIC RHINITIS 02/08/2007  . ANXIETY 02/08/2007  . DEPRESSION 02/08/2007  . DIABETES MELLITUS, TYPE I 02/08/2007  . Headache(784.0) 11/06/2008  . HYPERCHOLESTEROLEMIA 11/05/2008  . HYPERTENSION 02/08/2007  . URINARY INCONTINENCE 02/08/2007  . Dyslipidemia   . Non-ulcer dyspepsia   . Leukopenia   . DM retinopathy     background  . 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 Dementia   . CHF (congestive heart failure)   . Anemia     takes iron  . Skin abnormality     both knees with opne ares and left shoulder with open areas, changes dressings prn    Past Surgical History  Procedure Laterality Date  . Total knee arthroplasty  1990    left  . Panendoscopy  10/27/1999  . Stress cardiolite  12/14/2003  . Electrocardiogram  05/17/2006  . Abdominal hysterectomy    . Bladder tach  years ago    History   Social History  . Marital Status: Married    Spouse Name: N/A    Number of Children: N/A  . Years of Education: N/A   Occupational History  . Retired    Social History Main Topics   . Smoking status: Never Smoker   . Smokeless tobacco: Never Used  . Alcohol Use: No  . Drug Use: No  . Sexual Activity: No   Other Topics Concern  . Not on file   Social History Narrative    Current Outpatient Prescriptions on File Prior to Visit  Medication Sig Dispense Refill  . ACCU-CHEK SMARTVIEW test strip 1 each by Other route 2 (two) times daily.     Marland Kitchen aspirin EC 325 MG tablet Take 325 mg by mouth every morning.    Marland Kitchen atorvastatin (LIPITOR) 40 MG tablet Take 40 mg by mouth every morning.     . citalopram (CELEXA) 20 MG tablet Take 20 mg by mouth every morning.     . divalproex (DEPAKOTE) 250 MG DR tablet Take 250 mg by mouth every morning.    . donepezil (ARICEPT) 10 MG tablet Take 10 mg by mouth at bedtime.    . ferrous sulfate 325 (65 FE) MG tablet Take 325 mg by mouth daily with breakfast.    . furosemide (LASIX) 40 MG tablet Take 40 mg by mouth every morning.     . Insulin Lispro Prot & Lispro (HUMALOG MIX 50/50 KWIKPEN) (50-50) 100 UNIT/ML Kwikpen Inject 30 Units into the skin every morning.     . Insulin Pen Needle (B-D ULTRAFINE III  SHORT PEN) 31G X 8 MM MISC Use 2 per day. (Patient taking differently: 1 each by Other route 2 (two) times daily. Use 2 per day.) 100 each 6  . Lancet Devices (ADVOCATE LANCING DEVICE) MISC 1 each by Other route 2 (two) times daily.     Marland Kitchen lisinopril (PRINIVIL,ZESTRIL) 2.5 MG tablet TAKE 1 TABLET (2.5 MG TOTAL) BY MOUTH DAILY. 30 tablet 3  . lisinopril (PRINIVIL,ZESTRIL) 2.5 MG tablet Take 2.5 mg by mouth every morning.    Marland Kitchen MOVIPREP 100 G SOLR Take 1 kit (200 g total) by mouth once. 1 kit 0  . Propylene Glycol (SYSTANE BALANCE OP) Apply 1 drop to eye 3 (three) times daily as needed (dry eyes).    . sodium chloride (OCEAN) 0.65 % SOLN nasal spray Place 2 sprays into both nostrils as needed for congestion.     No current facility-administered medications on file prior to visit.    Allergies  Allergen Reactions  . Aspirin Nausea Only  .  Codeine Nausea Only    Family History  Problem Relation Age of Onset  . Alcohol abuse Son   . Cancer Neg Hx     no cancer in immediate family  . CAD Father     BP 140/80 mmHg  Pulse 71  Temp(Src) 98.7 F (37.1 C) (Oral)  Ht 5' 3"  (1.6 m)  Wt 173 lb (78.472 kg)  BMI 30.65 kg/m2  SpO2 97%    Review of Systems She has regained a few lbs she lost during diarrhea illness.  Denies n/v    Objective:   Physical Exam VITAL SIGNS:  See vs page GENERAL: no distress.  In wheelchair LUNGS:  Clear to auscultation HEART:  Regular rate and rhythm without murmurs noted. Normal S1,S2.    Lab Results  Component Value Date   CREATININE 0.8 06/18/2014   BUN 15 06/18/2014   NA 136 06/18/2014   K 4.1 06/18/2014   CL 96 06/18/2014   CO2 30 06/18/2014   BNP=148    Assessment & Plan:  Weight loss, improved. Diarrhea, uncertain etiology, resolved CHF: well-controlled.  Same rx DM: Based on the pattern of her cbg's, she needs some adjustment in her therapy: Please increase the insulin to 30 units daily, with breakfast..   Subjective:   Patient here for Medicare annual wellness visit and management of other chronic and acute problems.     Risk factors: advanced age    37 of Physicians Providing Medical Care to Patient:  See "snapshot"   Activities of Daily Living: In your present state of health, do you have any difficulty performing the following activities? (husb is always with her) Preparing food and eating?: yes Bathing yourself: yes Getting dressed: yes Using the toilet: yes Moving around from place to place: yes In the past year have you fallen or had a near fall?:No    Home Safety: Has smoke detector and wears seat belts. No firearms. No excess sun exposure.  Diet and Exercise  Current exercise habits: very limited by health probs Dietary issues discussed: pt reports a healthy diet   Depression Screen  Q1: Over the past two weeks, have you felt down, depressed  or hopeless? no  Q2: Over the past two weeks, have you felt little interest or pleasure in doing things? no   The following portions of the patient's history were reviewed and updated as appropriate: allergies, current medications, past family history, past medical history, past social history, past surgical history and problem  list.   Review of Systems  Denies hearing loss, and visual loss Objective:   Vision:  Sees opthalmologist Hearing: grossly normal Body mass index:  See vs page Msk: pt is unable to perform "get-up-and-go" from a sitting position Cognitive Impairment Assessment: cognition and judgment appear good, but memory is poor. remembers 0/3 at 5 minutes.  excellent recall.  Is unable to read and write a sentence.  Alert, but oriented to self only.     Assessment:   Medicare wellness utd on preventive parameters    Plan:   During the course of the visit the patient was educated and counseled about appropriate screening and preventive services including:       Fall prevention   Screening mammography  Bone densitometry screening  Diabetes screening  Nutrition counseling   Vaccines / LABS Zostavax / Pneumococcal Vaccine  today   Patient Instructions (the written plan) was given to the patient.

## 2014-06-18 NOTE — Patient Instructions (Addendum)
Please come back for a regular physical appointment in 2 months.   blood tests are being requested for you today.  We'll let you know about the results. Please increase the insulin to 30 units daily, with breakfast.  please consider these measures for your health:  minimize alcohol.  do not use tobacco products.  have a colonoscopy at least every 10 years from age 70.  Women should have an annual mammogram from age 70.  keep firearms safely stored.  always use seat belts.  have working smoke alarms in your home.  see an eye doctor and dentist regularly.  never drive under the influence of alcohol or drugs (including prescription drugs).  please let me know what your wishes would be, if artificial life support measures should become necessary.  it is critically important to prevent falling down (keep floor areas well-lit, dry, and free of loose objects.  If you have a cane, walker, or wheelchair, you should use it, even for short trips around the house.  Also, try not to rush). [facility] [phone], to make an appointment for a mammogram

## 2014-06-19 ENCOUNTER — Ambulatory Visit: Payer: Self-pay | Admitting: Endocrinology

## 2014-06-21 ENCOUNTER — Encounter (HOSPITAL_COMMUNITY)
Admission: RE | Disposition: A | Discharge status: Home / Self Care | Payer: Self-pay | Source: Ambulatory Visit | Attending: Gastroenterology

## 2014-06-21 ENCOUNTER — Ambulatory Visit (HOSPITAL_COMMUNITY): Payer: Commercial Managed Care - HMO | Admitting: Anesthesiology

## 2014-06-21 ENCOUNTER — Ambulatory Visit (HOSPITAL_COMMUNITY)
Admission: RE | Admit: 2014-06-21 | Discharge: 2014-06-21 | Disposition: A | Discharge status: Home / Self Care | Payer: Commercial Managed Care - HMO | Source: Ambulatory Visit | Attending: Gastroenterology | Admitting: Gastroenterology

## 2014-06-21 ENCOUNTER — Encounter (HOSPITAL_COMMUNITY): Payer: Self-pay | Admitting: *Deleted

## 2014-06-21 DIAGNOSIS — Z79899 Other long term (current) drug therapy: Secondary | ICD-10-CM | POA: Diagnosis not present

## 2014-06-21 DIAGNOSIS — F0280 Dementia in other diseases classified elsewhere without behavioral disturbance: Secondary | ICD-10-CM | POA: Diagnosis not present

## 2014-06-21 DIAGNOSIS — Z794 Long term (current) use of insulin: Secondary | ICD-10-CM | POA: Diagnosis not present

## 2014-06-21 DIAGNOSIS — F419 Anxiety disorder, unspecified: Secondary | ICD-10-CM | POA: Insufficient documentation

## 2014-06-21 DIAGNOSIS — I5023 Acute on chronic systolic (congestive) heart failure: Secondary | ICD-10-CM | POA: Diagnosis not present

## 2014-06-21 DIAGNOSIS — E10319 Type 1 diabetes mellitus with unspecified diabetic retinopathy without macular edema: Secondary | ICD-10-CM | POA: Diagnosis not present

## 2014-06-21 DIAGNOSIS — M1990 Unspecified osteoarthritis, unspecified site: Secondary | ICD-10-CM | POA: Insufficient documentation

## 2014-06-21 DIAGNOSIS — R197 Diarrhea, unspecified: Secondary | ICD-10-CM | POA: Insufficient documentation

## 2014-06-21 DIAGNOSIS — K5730 Diverticulosis of large intestine without perforation or abscess without bleeding: Secondary | ICD-10-CM | POA: Diagnosis not present

## 2014-06-21 DIAGNOSIS — F329 Major depressive disorder, single episode, unspecified: Secondary | ICD-10-CM | POA: Insufficient documentation

## 2014-06-21 DIAGNOSIS — Z7982 Long term (current) use of aspirin: Secondary | ICD-10-CM | POA: Diagnosis not present

## 2014-06-21 DIAGNOSIS — E785 Hyperlipidemia, unspecified: Secondary | ICD-10-CM | POA: Insufficient documentation

## 2014-06-21 DIAGNOSIS — I10 Essential (primary) hypertension: Secondary | ICD-10-CM | POA: Insufficient documentation

## 2014-06-21 DIAGNOSIS — Z1211 Encounter for screening for malignant neoplasm of colon: Secondary | ICD-10-CM

## 2014-06-21 DIAGNOSIS — E780 Pure hypercholesterolemia: Secondary | ICD-10-CM | POA: Insufficient documentation

## 2014-06-21 DIAGNOSIS — G309 Alzheimer's disease, unspecified: Secondary | ICD-10-CM | POA: Diagnosis not present

## 2014-06-21 HISTORY — DX: Anemia, unspecified: D64.9

## 2014-06-21 HISTORY — PX: COLONOSCOPY WITH PROPOFOL: SHX5780

## 2014-06-21 HISTORY — DX: Disorder of the skin and subcutaneous tissue, unspecified: L98.9

## 2014-06-21 LAB — GLUCOSE, CAPILLARY: GLUCOSE-CAPILLARY: 92 mg/dL (ref 70–99)

## 2014-06-21 SURGERY — COLONOSCOPY WITH PROPOFOL
Anesthesia: Monitor Anesthesia Care

## 2014-06-21 MED ORDER — SODIUM CHLORIDE 0.9 % IV SOLN: INTRAVENOUS | Status: DC

## 2014-06-21 MED ORDER — PROPOFOL 10 MG/ML IV BOLUS
INTRAVENOUS | Status: DC | PRN
  Administered 2014-06-21: 50 mg via INTRAVENOUS

## 2014-06-21 MED ORDER — PROPOFOL INFUSION 10 MG/ML OPTIME
INTRAVENOUS | Status: DC | PRN
  Administered 2014-06-21: 100 ug/kg/min via INTRAVENOUS

## 2014-06-21 MED ORDER — LACTATED RINGERS IV SOLN
INTRAVENOUS | Status: DC
  Administered 2014-06-21: 09:00:00 via INTRAVENOUS

## 2014-06-21 MED ORDER — PROPOFOL 10 MG/ML IV BOLUS
INTRAVENOUS | Status: AC
  Filled 2014-06-21: qty 20

## 2014-06-21 SURGICAL SUPPLY — 22 items

## 2014-06-21 NOTE — Anesthesia Postprocedure Evaluation (Signed)
  Anesthesia Post-op Note  Patient: Dana [name]  Procedure(s) Performed: Procedure(s) (LRB): COLONOSCOPY WITH PROPOFOL (N/A)  Patient Location: PACU  Anesthesia Type: MAC  Level of Consciousness: awake and alert   Airway and Oxygen Therapy: Patient Spontanous Breathing  Post-op Pain: mild  Post-op Assessment: Post-op Vital signs reviewed, Patient's Cardiovascular Status Stable, Respiratory Function Stable, Patent Airway and No signs of Nausea or vomiting  Last Vitals:  Filed Vitals:   06/21/14 1030  BP:   Pulse: 74  Temp:   Resp: 21    Post-op Vital Signs: stable   Complications: No apparent anesthesia complications

## 2014-06-21 NOTE — Discharge Instructions (Signed)

## 2014-06-21 NOTE — Op Note (Signed)
[facility] [address] [place], [id number]   COLONOSCOPY PROCEDURE REPORT  PATIENT: Minda MeoWare, Tylisha M  MR#: [id number] BIRTHDATE: [birth year] , 68  yrs. old GENDER: female ENDOSCOPIST: Rachael Feeaniel P Jacobs, MD PROCEDURE DATE:  06/21/2014 PROCEDURE:   Colonoscopy with biopsy First Screening Colonoscopy - Avg.  risk and is 50 yrs.  old or older - No.  Prior Negative Screening - Now for repeat screening. N/A  History of Adenoma - Now for follow-up colonoscopy & has been > or = to 3 yrs.  N/A  Polyps Removed Today? No.  Recommend repeat exam, <10 yrs? No. ASA CLASS:   Class III INDICATIONS:intermittent diarrhea. MEDICATIONS: Monitored anesthesia care  DESCRIPTION OF PROCEDURE:   After the risks benefits and alternatives of the procedure were thoroughly explained, informed consent was obtained.  The digital rectal exam revealed no abnormalities of the rectum.   The Pentax Ped Colon [id number] endoscope was introduced through the anus and advanced to the terminal ileum which was intubated for a short distance. No adverse events experienced.   The quality of the prep was excellent.  The instrument was then slowly withdrawn as the colon was fully examined.   COLON FINDINGS: The terminal ileum was normal.  There were a few scattered diverticulum throughout the colon.  The examination was otherwise normal.  The colonic mucosa was randomly biopsied and sent to pathology.  Retroflexed views revealed no abnormalities. The time to cecum=3 minutes 00 seconds.  Withdrawal time=10 minutes 00 seconds.  The scope was withdrawn and the procedure completed. COMPLICATIONS: There were no immediate complications.  ENDOSCOPIC IMPRESSION: The terminal ileum was normal.  There were a few scattered diverticulum throughout the colon.  The examination was otherwise normal.  The colonic mucosa was randomly biopsied and sent to pathology  RECOMMENDATIONS: Await final biopies.  For now, please  start once daily imodium (best taken shortly after you wake in the morning).  Hold or stop the imodium if you become constipated.  eSigned:  Rachael Feeaniel P Jacobs, MD 06/21/2014 9:58 AM   cc: Romero BellingSean Ellison, MD

## 2014-06-21 NOTE — H&P (View-Only) (Signed)
HPI: This is a  Very pleasant89 year old woman who is here with her husband today. I am meeting her for the first time. She is in a wheelchair. She has some obvious memory difficulties due to Alzheimer's dementia. She is a fair historian overall. Her husband tries to fill in the gaps as best as possible.     Referred for diarrhea, Dr. Everardo AllEllison.  Occurred about 2 moths ago, last several weeks.  Non-bloody.  Very loose; twice daily.  Never nocturnal symptoms.  Was put on a medicine for diarrhea, prescription, he does  Husband says he had loose stools too.  Her diarrhea has resolvedAnd for the past few weeks she has really been back to nearly normal per her husband.  She had a colonoscopy about 15 years ago but no colon cancer screening since then.  Review of systems: Pertinent positive and negative review of systems were noted in the above HPI section. Complete review of systems was performed and was otherwise normal.    Past Medical History  Diagnosis Date  . ALLERGIC RHINITIS 02/08/2007  . ANXIETY 02/08/2007  . Cough 08/15/2008  . DEPRESSION 02/08/2007  . DIABETES MELLITUS, TYPE I 02/08/2007  . Headache(784.0) 11/06/2008  . HYPERCHOLESTEROLEMIA 11/05/2008  . HYPERTENSION 02/08/2007  . URINARY INCONTINENCE 02/08/2007  . Dyslipidemia   . Non-ulcer dyspepsia   . Leukopenia   . DM retinopathy     background  . 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 Dementia   . CHF (congestive heart failure)     Past Surgical History  Procedure Laterality Date  . Total knee arthroplasty  1990    left  . Panendoscopy  10/27/1999  . Stress cardiolite  12/14/2003  . Electrocardiogram  05/17/2006  . Abdominal hysterectomy      Current Outpatient Prescriptions  Medication Sig Dispense Refill  . ACCU-CHEK SMARTVIEW test strip     . aspirin EC 325 MG tablet     . atorvastatin (LIPITOR) 40 MG tablet Take 40 mg by mouth daily.    . Blood Glucose Calibration (ACCU-CHEK AVIVA) SOLN     .  citalopram (CELEXA) 20 MG tablet Take 20 mg by mouth daily.    . divalproex (DEPAKOTE SPRINKLE) 125 MG capsule Take 250 mg by mouth daily.     Marland Kitchen. donepezil (ARICEPT) 5 MG tablet Take 1 tablet (5 mg total) by mouth at bedtime. 30 tablet 11  . ferrous sulfate 325 (65 FE) MG tablet Take 325 mg by mouth daily with breakfast.    . furosemide (LASIX) 40 MG tablet Take 80 mg by mouth daily.    Marland Kitchen. HUMALOG MIX 50/50 KWIKPEN (50-50) 100 UNIT/ML Kwikpen INJECT 25 UNITS INTO THE SKIN DAILY WITH BREAKFAST. AND PEN NEEDLES 1/DAY 15 mL 1  . Insulin Pen Needle (B-D ULTRAFINE III SHORT PEN) 31G X 8 MM MISC Use 2 per day. 100 each 6  . Lancet Devices (ADVOCATE LANCING DEVICE) MISC     . lisinopril (PRINIVIL,ZESTRIL) 2.5 MG tablet TAKE 1 TABLET (2.5 MG TOTAL) BY MOUTH DAILY. 30 tablet 3  . mirtazapine (REMERON) 30 MG tablet     . PHARMACIST CHOICE LANCETS MISC      No current facility-administered medications for this visit.    Allergies as of 05/23/2014 - Review Complete 05/23/2014  Allergen Reaction Noted  . Aspirin Nausea Only   . Codeine Nausea Only 08/15/2008    Family History  Problem Relation Age of Onset  . Alcohol abuse Son   .  Cancer Neg Hx     no cancer in immediate family  . CAD Father     History   Social History  . Marital Status: Married    Spouse Name: N/A    Number of Children: N/A  . Years of Education: N/A   Occupational History  . Retired    Social History Main Topics  . Smoking status: Never Smoker   . Smokeless tobacco: Never Used  . Alcohol Use: No  . Drug Use: No  . Sexual Activity: No   Other Topics Concern  . Not on file   Social History Narrative       Physical Exam: BP 168/90 mmHg  Pulse 82  Ht 5\' 3"  (1.6 m)  Wt 166 lb (75.297 kg)  BMI 29.41 kg/m2 Constitutional: generally well-appearing, sits in a wheelchair Psychiatric: alert and oriented x3, minor memory difficulties Eyes: extraocular movements intact Mouth: oral pharynx moist, no  lesions Neck: supple no lymphadenopathy Cardiovascular: heart regular rate and rhythm Lungs: clear to auscultation bilaterally Abdomen: soft, nontender, nondistended, no obvious ascites, no peritoneal signs, normal bowel sounds Extremities: no lower extremity edema bilaterally Skin: no lesions on visible extremities    Assessment and plan: 68 y.o. female with  Recent diarrhea that has since resolved, routine risk for colon cancer  She is feeling better overall. Perhaps she had acute infectious diarrhea. Aricept can contribute to diarrhea as well, her husband is not really sure when she started that. Either way her symptoms have really improved and I recommended that if they return she should try Imodium periodically. She has not had colon cancer screening in 15 years and I recommended we proceed with colonoscopy at her soonest convenience.

## 2014-06-21 NOTE — Transfer of Care (Signed)
Immediate Anesthesia Transfer of Care Note  Patient: Dana [name]  Procedure(s) Performed: Procedure(s) (LRB): COLONOSCOPY WITH PROPOFOL (N/A)  Patient Location: PACU  Anesthesia Type: MAC  Level of Consciousness: sedated, patient cooperative and responds to stimulation  Airway & Oxygen Therapy: Patient Spontanous Breathing and Patient connected to face mask oxgen  Post-op Assessment: Report given to PACU RN and Post -op Vital signs reviewed and stable  Post vital signs: Reviewed and stable  Complications: No apparent anesthesia complications

## 2014-06-21 NOTE — Anesthesia Preprocedure Evaluation (Addendum)
Anesthesia Evaluation  Patient identified by MRN, date of birth, ID band Patient awake    Reviewed: Allergy & Precautions, H&P , NPO status , Patient's Chart, lab work & pertinent test results  Airway Mallampati: II  TM Distance: >3 FB Neck ROM: full    Dental no notable dental hx. (+) Teeth Intact, Dental Advisory Given   Pulmonary neg pulmonary ROS, shortness of breath,  breath sounds clear to auscultation  Pulmonary exam normal       Cardiovascular hypertension, Pt. on medications +CHF Rhythm:regular Rate:Normal  LBBB acute and chronic systolic heart failure   Neuro/Psych Alzheimer's dementia negative psych ROS   GI/Hepatic negative GI ROS, Neg liver ROS,   Endo/Other  diabetes, Well Controlled, Type 2, Oral Hypoglycemic Agents  Renal/GU negative Renal ROS  negative genitourinary   Musculoskeletal   Abdominal   Peds  Hematology negative hematology ROS (+)   Anesthesia Other Findings   Reproductive/Obstetrics negative OB ROS                            Anesthesia Physical Anesthesia Plan  ASA: III  Anesthesia Plan: MAC   Post-op Pain Management:    Induction:   Airway Management Planned:   Additional Equipment:   Intra-op Plan:   Post-operative Plan:   Informed Consent: I have reviewed the patients History and Physical, chart, labs and discussed the procedure including the risks, benefits and alternatives for the proposed anesthesia with the patient or authorized representative who has indicated his/her understanding and acceptance.   Dental Advisory Given  Plan Discussed with: CRNA and Surgeon  Anesthesia Plan Comments:         Anesthesia Quick Evaluation

## 2014-06-21 NOTE — Interval H&P Note (Signed)
History and Physical Interval Note:  06/21/2014 9:06 AM  Dana [name]  has presented today for surgery, with the diagnosis of colon cancer screening  The various methods of treatment have been discussed with the patient and family. After consideration of risks, benefits and other options for treatment, the patient has consented to  Procedure(s): COLONOSCOPY WITH PROPOFOL (N/A) as a surgical intervention .  The patient's history has been reviewed, patient examined, no change in status, stable for surgery.  I have reviewed the patient's chart and labs.  Questions were answered to the patient's satisfaction.     Rachael FeeJacobs, Shella Lahman P

## 2014-06-22 ENCOUNTER — Encounter (HOSPITAL_COMMUNITY): Payer: Self-pay | Admitting: Gastroenterology

## 2014-06-27 ENCOUNTER — Other Ambulatory Visit: Payer: Self-pay | Admitting: Cardiovascular Disease

## 2014-06-27 ENCOUNTER — Other Ambulatory Visit: Payer: Self-pay | Admitting: Endocrinology

## 2014-07-02 ENCOUNTER — Inpatient Hospital Stay: Admission: RE | Admit: 2014-07-02 | Payer: Self-pay | Source: Ambulatory Visit

## 2014-07-06 ENCOUNTER — Other Ambulatory Visit: Payer: Self-pay | Admitting: Endocrinology

## 2014-07-11 ENCOUNTER — Other Ambulatory Visit: Payer: Self-pay | Admitting: Cardiovascular Disease

## 2014-07-11 ENCOUNTER — Telehealth: Payer: Self-pay

## 2014-07-11 NOTE — Telephone Encounter (Signed)
lisinopril (PRINIVIL,ZESTRIL) 2.5 MG tablet 30 tablet 0 06/27/2014     Sig:  TAKE 1 TABLET (2.5 MG TOTAL) BY MOUTH DAILY.    Class:  Normal    DAW:  No    Comment:  **PATIENT NEEDS AN APPOINTMENT**    Authorizing Provider:  Kathleene Hazelhristopher D McAlhany, MD    Ordering User:  Valrie HartMindy L Isley, CMA

## 2014-07-16 NOTE — Telephone Encounter (Signed)
Refill request received in office for lisinopril.  Pt due for follow up appt with Dr. Clifton JamesMcAlhany.  I spoke with pt's husband and appt made for July 17, 2014 at 3:30.

## 2014-07-17 ENCOUNTER — Encounter: Payer: Self-pay | Admitting: Cardiovascular Disease

## 2014-07-17 ENCOUNTER — Ambulatory Visit (INDEPENDENT_AMBULATORY_CARE_PROVIDER_SITE_OTHER): Payer: Commercial Managed Care - HMO | Admitting: Cardiovascular Disease

## 2014-07-17 VITALS — BP 138/64 | HR 67 | Ht 63.0 in | Wt 169.2 lb

## 2014-07-17 DIAGNOSIS — I5022 Chronic systolic (congestive) heart failure: Secondary | ICD-10-CM

## 2014-07-17 DIAGNOSIS — I2510 Atherosclerotic heart disease of native coronary artery without angina pectoris: Secondary | ICD-10-CM

## 2014-07-17 DIAGNOSIS — I255 Ischemic cardiomyopathy: Secondary | ICD-10-CM

## 2014-07-17 DIAGNOSIS — I10 Essential (primary) hypertension: Secondary | ICD-10-CM

## 2014-07-17 DIAGNOSIS — E785 Hyperlipidemia, unspecified: Secondary | ICD-10-CM

## 2014-07-17 MED ORDER — CARVEDILOL 3.125 MG PO TABS: 3.1250 mg | ORAL_TABLET | Freq: Two times a day (BID) | ORAL | Status: DC

## 2014-07-17 MED ORDER — LISINOPRIL 2.5 MG PO TABS: 2.5000 mg | ORAL_TABLET | Freq: Every morning | ORAL | Status: DC

## 2014-07-17 NOTE — Progress Notes (Signed)
History of Present Illness: 68 yo female with history of cardiomyopathy, HTN, DM, HLD, anxiety/depression here today for cardiac follow up. [facility] with lower extremity edema, dyspnea on exertion. Echo 03/09/13 with LVEF 25-30%. No significant valve disease. We discussed cardiac cath to exclude CAD but she was very agitated at times requiring a sitter (presumed to be from baseline dementia). She was combative with nursing staff. Stress myoview 03/14/13 was intermediate risk myoview with moderate anteroapical wall MI and moderate peri infarct ischemia. EF severely reduced at 33%. Given her dementia, she was managed conservatively. She was diuresed well during admission and discharged home on Lasix. She has done well from a cardiac standpoint. Lasix held this fall while she was having diarrhea. Colonoscopy ok December 2015. Back on Lasix.    She is here today for follow up. She tells me she feels well. Husband gives most of history and describes that she is always tired and has some dizziness. No syncope. No chest pain. Mostly sits on couch during the day. No LE edema. Weight is stable.   Primary Care Physician: Romero BellingEllison, Sean  Past Medical History  Diagnosis Date  . ALLERGIC RHINITIS 02/08/2007  . ANXIETY 02/08/2007  . DEPRESSION 02/08/2007  . DIABETES MELLITUS, TYPE I 02/08/2007  . Headache(784.0) 11/06/2008  . HYPERCHOLESTEROLEMIA 11/05/2008  . HYPERTENSION 02/08/2007  . URINARY INCONTINENCE 02/08/2007  . Dyslipidemia   . Non-ulcer dyspepsia   . Leukopenia   . DM retinopathy     background  . 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 Dementia   . CHF (congestive heart failure)   . Anemia     takes iron  . Skin abnormality     both knees with opne ares and left shoulder with open areas, changes dressings prn    Past Surgical History  Procedure Laterality Date  . Total knee arthroplasty  1990    left  . Panendoscopy  10/27/1999  . Stress cardiolite   12/14/2003  . Electrocardiogram  05/17/2006  . Abdominal hysterectomy    . Bladder tach  years ago  . Colonoscopy with propofol N/A 06/21/2014    Procedure: COLONOSCOPY WITH PROPOFOL;  Surgeon: Rachael Feeaniel P Jacobs, MD;  Location: [facility] ENDOSCOPY;  Service: Endoscopy;  Laterality: N/A;    Current Outpatient Prescriptions  Medication Sig Dispense Refill  . ACCU-CHEK SMARTVIEW test strip 1 each by Other route 2 (two) times daily.     Marland Kitchen. aspirin EC 325 MG tablet Take 325 mg by mouth every morning.    Marland Kitchen. atorvastatin (LIPITOR) 20 MG tablet TAKE 1 TABLET DAILY. 90 tablet 0  . carvedilol (COREG) 3.125 MG tablet Pt does not take daily    . citalopram (CELEXA) 20 MG tablet TAKE 1 TABLET (20 MG TOTAL) BY MOUTH DAILY. 30 tablet 3  . divalproex (DEPAKOTE ER) 250 MG 24 hr tablet     . donepezil (ARICEPT) 10 MG tablet Take 10 mg by mouth at bedtime.    . ferrous sulfate 325 (65 FE) MG tablet Take 325 mg by mouth daily with breakfast.    . furosemide (LASIX) 40 MG tablet Take 40 mg by mouth 3 (three) times daily. TWO IN THE MORNING AND ON IN THE AFTERNOON    . Insulin Lispro Prot & Lispro (HUMALOG MIX 50/50 KWIKPEN) (50-50) 100 UNIT/ML Kwikpen Inject 30 Units into the skin every morning.     . Insulin Pen Needle (B-D ULTRAFINE III SHORT PEN)  31G X 8 MM MISC Use 2 per day. (Patient taking differently: 1 each by Other route 2 (two) times daily. Use 2 per day.) 100 each 6  . Lancet Devices (ADVOCATE LANCING DEVICE) MISC 1 each by Other route 2 (two) times daily.     Marland Kitchen. lisinopril (PRINIVIL,ZESTRIL) 2.5 MG tablet Take 2.5 mg by mouth every morning.    Marland Kitchen. Propylene Glycol (SYSTANE BALANCE OP) Apply 1 drop to eye 3 (three) times daily as needed (dry eyes).    . sodium chloride (OCEAN) 0.65 % SOLN nasal spray Place 2 sprays into both nostrils as needed for congestion.     No current facility-administered medications for this visit.    Allergies  Allergen Reactions  . Aspirin Nausea Only  . Codeine Nausea Only     History   Social History  . Marital Status: Married    Spouse Name: N/A    Number of Children: N/A  . Years of Education: N/A   Occupational History  . Retired    Social History Main Topics  . Smoking status: Never Smoker   . Smokeless tobacco: Never Used  . Alcohol Use: No  . Drug Use: No  . Sexual Activity: No   Other Topics Concern  . Not on file   Social History Narrative    Family History  Problem Relation Age of Onset  . Alcohol abuse Son   . Cancer Neg Hx     no cancer in immediate family  . CAD Father     Review of Systems:  As stated in the HPI and otherwise negative.   BP 138/64 mmHg  Pulse 67  Ht 5\' 3"  (1.6 m)  Wt 169 lb 3.2 oz (76.749 kg)  BMI 29.98 kg/m2  SpO2 97%  Physical Examination: General: Well developed, well nourished, NAD HEENT: OP clear, mucus membranes moist SKIN: warm, dry. No rashes. Neuro: No focal deficits Musculoskeletal: Muscle strength 5/5 all ext Psychiatric: Mood and affect normal Neck: No JVD, no carotid bruits, no thyromegaly, no lymphadenopathy. Lungs:Clear bilaterally, no wheezes, rhonci, crackles Cardiovascular: Regular rate and rhythm. No murmurs, gallops or rubs. Abdomen:Soft. Bowel sounds present. Non-tender.  Extremities: No lower extremity edema. Pulses are 2 + in the bilateral DP/PT  Echo 03/09/13: Left ventricle: The cavity size was normal. Wall thickness was normal. Systolic function was severely reduced. The estimated ejection fraction was in the range of 25% to 30%. There is akinesis of the anteroseptal and apical myocardium. There is hypokinesis of the inferior myocardium. Doppler parameters are consistent with abnormal left ventricular relaxation (grade 1 diastolic dysfunction). - Mitral valve: Calcified annulus. Mildly thickened leaflets - Left atrium: The atrium was mildly dilated. - Pericardium, extracardiac: A trivial pericardial effusion  Assessment and Plan:   1. Ischemic Cardiomyopathy:  Pt with new diagnosis of cardiomyopathy in hospital August 2014, LVEF=25-30%. Stress myoview with possible ischemia. She has rare chest pains. She is presumed to have CAD but given her profound dementia as witnessed in the hospital and lack of typical chest pain, we have elected for medical management only at this time. She is doing well with conservative therapy. Continue ASA, statin, beta blocker, Ace-inh. If she has chest pain, could add Imdur. Her dementia is advanced.   2. Acute on chronic systolic CHF:  No LE edema. No SOB. Continue Lasix 40 mg Qdaily. Follow weights closely at home.   3. Hyperlipidemia: On a statin. Followed by Dr. Everardo AllEllison  4. Dementia: Advanced. She is oriented to person only  today.   5. HTN: BP controlled. No changes today.

## 2014-07-17 NOTE — Patient Instructions (Signed)
Your physician wants you to follow-up in: 6 months.  You will receive a reminder letter in the mail two months in advance. If you don't receive a letter, please call our office to schedule the follow-up appointment.  Resume Coreg 3.125 mg by mouth twice daily.

## 2014-07-30 ENCOUNTER — Emergency Department (HOSPITAL_COMMUNITY)
Admission: EM | Admit: 2014-07-30 | Discharge: 2014-07-30 | Disposition: A | Discharge status: Home / Self Care | Payer: Commercial Managed Care - HMO | Attending: Emergency Medicine | Admitting: Emergency Medicine

## 2014-07-30 ENCOUNTER — Encounter (HOSPITAL_COMMUNITY): Payer: Self-pay | Admitting: Family Medicine

## 2014-07-30 DIAGNOSIS — M1990 Unspecified osteoarthritis, unspecified site: Secondary | ICD-10-CM | POA: Insufficient documentation

## 2014-07-30 DIAGNOSIS — R51 Headache: Secondary | ICD-10-CM | POA: Diagnosis present

## 2014-07-30 DIAGNOSIS — E109 Type 1 diabetes mellitus without complications: Secondary | ICD-10-CM | POA: Diagnosis not present

## 2014-07-30 DIAGNOSIS — Z79899 Other long term (current) drug therapy: Secondary | ICD-10-CM | POA: Diagnosis not present

## 2014-07-30 DIAGNOSIS — F329 Major depressive disorder, single episode, unspecified: Secondary | ICD-10-CM | POA: Insufficient documentation

## 2014-07-30 DIAGNOSIS — F419 Anxiety disorder, unspecified: Secondary | ICD-10-CM | POA: Insufficient documentation

## 2014-07-30 DIAGNOSIS — R197 Diarrhea, unspecified: Secondary | ICD-10-CM | POA: Diagnosis not present

## 2014-07-30 DIAGNOSIS — N390 Urinary tract infection, site not specified: Secondary | ICD-10-CM | POA: Insufficient documentation

## 2014-07-30 DIAGNOSIS — Z794 Long term (current) use of insulin: Secondary | ICD-10-CM | POA: Diagnosis not present

## 2014-07-30 DIAGNOSIS — E785 Hyperlipidemia, unspecified: Secondary | ICD-10-CM | POA: Diagnosis not present

## 2014-07-30 DIAGNOSIS — F0390 Unspecified dementia without behavioral disturbance: Secondary | ICD-10-CM | POA: Insufficient documentation

## 2014-07-30 DIAGNOSIS — Z8719 Personal history of other diseases of the digestive system: Secondary | ICD-10-CM | POA: Diagnosis not present

## 2014-07-30 DIAGNOSIS — Z7982 Long term (current) use of aspirin: Secondary | ICD-10-CM | POA: Insufficient documentation

## 2014-07-30 DIAGNOSIS — E780 Pure hypercholesterolemia: Secondary | ICD-10-CM | POA: Diagnosis not present

## 2014-07-30 DIAGNOSIS — D649 Anemia, unspecified: Secondary | ICD-10-CM | POA: Insufficient documentation

## 2014-07-30 DIAGNOSIS — R61 Generalized hyperhidrosis: Secondary | ICD-10-CM | POA: Insufficient documentation

## 2014-07-30 DIAGNOSIS — I10 Essential (primary) hypertension: Secondary | ICD-10-CM | POA: Diagnosis not present

## 2014-07-30 DIAGNOSIS — I509 Heart failure, unspecified: Secondary | ICD-10-CM | POA: Diagnosis not present

## 2014-07-30 LAB — CBC WITH DIFFERENTIAL/PLATELET
Basophils Absolute: 0.1 10*3/uL (ref 0.0–0.1)
Basophils Relative: 1 % (ref 0–1)
EOS ABS: 0.3 10*3/uL (ref 0.0–0.7)
Eosinophils Relative: 5 % (ref 0–5)
HEMATOCRIT: 37.9 % (ref 36.0–46.0)
Hemoglobin: 12.3 g/dL (ref 12.0–15.0)
Lymphocytes Relative: 35 % (ref 12–46)
Lymphs Abs: 1.8 10*3/uL (ref 0.7–4.0)
MCH: 27 pg (ref 26.0–34.0)
MCHC: 32.5 g/dL (ref 30.0–36.0)
MCV: 83.1 fL (ref 78.0–100.0)
MONOS PCT: 11 % (ref 3–12)
Monocytes Absolute: 0.6 10*3/uL (ref 0.1–1.0)
NEUTROS PCT: 48 % (ref 43–77)
Neutro Abs: 2.5 10*3/uL (ref 1.7–7.7)
PLATELETS: 233 10*3/uL (ref 150–400)
RBC: 4.56 MIL/uL (ref 3.87–5.11)
RDW: 16.2 % — ABNORMAL HIGH (ref 11.5–15.5)
WBC: 5.3 10*3/uL (ref 4.0–10.5)

## 2014-07-30 LAB — URINE MICROSCOPIC-ADD ON

## 2014-07-30 LAB — COMPREHENSIVE METABOLIC PANEL
ALT: 16 U/L (ref 0–35)
ANION GAP: 9 (ref 5–15)
AST: 19 U/L (ref 0–37)
Albumin: 3.6 g/dL (ref 3.5–5.2)
Alkaline Phosphatase: 76 U/L (ref 39–117)
BUN: 15 mg/dL (ref 6–23)
CO2: 27 mmol/L (ref 19–32)
Calcium: 9.4 mg/dL (ref 8.4–10.5)
Chloride: 105 mEq/L (ref 96–112)
Creatinine, Ser: 0.85 mg/dL (ref 0.50–1.10)
GFR calc non Af Amer: 68 mL/min — ABNORMAL LOW (ref >90)
GFR, EST AFRICAN AMERICAN: 79 mL/min — AB (ref >90)
GLUCOSE: 77 mg/dL (ref 70–99)
Potassium: 3.7 mmol/L (ref 3.5–5.1)
Sodium: 141 mmol/L (ref 135–145)
Total Bilirubin: 0.5 mg/dL (ref 0.3–1.2)
Total Protein: 7.3 g/dL (ref 6.0–8.3)

## 2014-07-30 LAB — URINALYSIS, ROUTINE W REFLEX MICROSCOPIC
Bilirubin Urine: NEGATIVE
GLUCOSE, UA: NEGATIVE mg/dL
KETONES UR: NEGATIVE mg/dL
NITRITE: NEGATIVE
PROTEIN: NEGATIVE mg/dL
SPECIFIC GRAVITY, URINE: 1.014 (ref 1.005–1.030)
Urobilinogen, UA: 0.2 mg/dL (ref 0.0–1.0)
pH: 5 (ref 5.0–8.0)

## 2014-07-30 LAB — I-STAT TROPONIN, ED: Troponin i, poc: 0 ng/mL (ref 0.00–0.08)

## 2014-07-30 LAB — I-STAT CG4 LACTIC ACID, ED: LACTIC ACID, VENOUS: 0.87 mmol/L (ref 0.5–2.2)

## 2014-07-30 LAB — LIPASE, BLOOD: LIPASE: 37 U/L (ref 11–59)

## 2014-07-30 LAB — VALPROIC ACID LEVEL: VALPROIC ACID LVL: 29.2 ug/mL — AB (ref 50.0–100.0)

## 2014-07-30 MED ORDER — CEPHALEXIN 250 MG PO CAPS
500.0000 mg | ORAL_CAPSULE | Freq: Once | ORAL | Status: AC
  Administered 2014-07-30: 500 mg via ORAL
  Filled 2014-07-30: qty 2

## 2014-07-30 MED ORDER — CEPHALEXIN 500 MG PO CAPS: 500.0000 mg | ORAL_CAPSULE | Freq: Three times a day (TID) | ORAL | Status: DC

## 2014-07-30 NOTE — Discharge Instructions (Signed)
Take keflex three times a day for a week.   Your depakote level is slightly low. Please talk to your doctor about it.   Follow up with your doctor.   Return to ER if you have vomiting, dehydration, fever, chest pain, abdominal pain.

## 2014-07-30 NOTE — ED Provider Notes (Signed)
CSN: [id number]     Arrival date & time 07/30/14  1253 History   First MD Initiated Contact with Patient 07/30/14 1628     Chief Complaint  Patient presents with  . Diarrhea  . Headache     (Consider location/radiation/quality/duration/timing/severity/associated sxs/prior Treatment) The history is provided by the patient and the spouse.  Dana [name] is a 69 y.o. female hx of DM, HL, CHF here with intermittent episodes of headache, diaphoresis, diarrhea. Patient is demented and unable to give much history. She states that she is feeling fine and may have had some diarrhea a week ago. She says she was evaluated in the ED but she wasn't, instead she saw PMD 2 weeks ago. Patient has been having intermittent diarrhea for several months that is worse at night. Sometimes associated with some headache and sometimes she gets diaphoretic. Denies any weight loss. Denies any fevers or chills or cough. Denies any abdominal pain.   Level V caveat- dementia    Past Medical History  Diagnosis Date  . ALLERGIC RHINITIS 02/08/2007  . ANXIETY 02/08/2007  . DEPRESSION 02/08/2007  . DIABETES MELLITUS, TYPE I 02/08/2007  . Headache(784.0) 11/06/2008  . HYPERCHOLESTEROLEMIA 11/05/2008  . HYPERTENSION 02/08/2007  . URINARY INCONTINENCE 02/08/2007  . Dyslipidemia   . Non-ulcer dyspepsia   . Leukopenia   . DM retinopathy     background  . 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 Dementia   . CHF (congestive heart failure)   . Anemia     takes iron  . Skin abnormality     both knees with opne ares and left shoulder with open areas, changes dressings prn   Past Surgical History  Procedure Laterality Date  . Total knee arthroplasty  1990    left  . Panendoscopy  10/27/1999  . Stress cardiolite  12/14/2003  . Electrocardiogram  05/17/2006  . Abdominal hysterectomy    . Bladder tach  years ago  . Colonoscopy with propofol N/A 06/21/2014    Procedure: COLONOSCOPY WITH PROPOFOL;  Surgeon: Rachael Feeaniel P  Jacobs, MD;  Location: [facility];  Service: Endoscopy;  Laterality: N/A;   Family History  Problem Relation Age of Onset  . Alcohol abuse Son   . Cancer Neg Hx     no cancer in immediate family  . CAD Father    History  Substance Use Topics  . Smoking status: Never Smoker   . Smokeless tobacco: Never Used  . Alcohol Use: No   OB History    No data available     Review of Systems  Gastrointestinal: Positive for diarrhea.  Neurological: Positive for headaches.  All other systems reviewed and are negative.     Allergies  Aspirin and Codeine  Home Medications   Prior to Admission medications   Medication Sig Start Date End Date Taking? Authorizing Provider  ACCU-CHEK SMARTVIEW test strip 1 each by Other route 2 (two) times daily.  11/25/13  Yes Historical Provider, MD  aspirin EC 325 MG tablet Take 325 mg by mouth every morning.   Yes Historical Provider, MD  atorvastatin (LIPITOR) 20 MG tablet TAKE 1 TABLET DAILY. 07/06/14  Yes Romero BellingSean Ellison, MD  carvedilol (COREG) 3.125 MG tablet Take 1 tablet (3.125 mg total) by mouth 2 (two) times daily with a meal. 07/17/14  Yes Kathleene Hazelhristopher D McAlhany, MD  citalopram (CELEXA) 20 MG tablet TAKE 1 TABLET (20 MG TOTAL) BY MOUTH DAILY. 06/27/14  Yes Romero BellingSean Ellison, MD  divalproex (DEPAKOTE ER) 250 MG 24 hr tablet  07/09/14  Yes Historical Provider, MD  donepezil (ARICEPT) 10 MG tablet Take 10 mg by mouth at bedtime.   Yes Historical Provider, MD  ferrous sulfate 325 (65 FE) MG tablet Take 325 mg by mouth daily with breakfast.   Yes Historical Provider, MD  furosemide (LASIX) 40 MG tablet Take 40 mg by mouth 3 (three) times daily. TWO IN THE MORNING AND ON IN THE AFTERNOON   Yes Historical Provider, MD  Insulin Lispro Prot & Lispro (HUMALOG MIX 50/50 KWIKPEN) (50-50) 100 UNIT/ML Kwikpen Inject 30 Units into the skin every morning.    Yes Historical Provider, MD  Insulin Pen Needle (B-D ULTRAFINE III SHORT PEN) 31G X 8 MM MISC Use 2 per  day. Patient taking differently: 1 each by Other route 2 (two) times daily. Use 2 per day. 01/18/14  Yes Romero Belling, MD  Lancet Devices (ADVOCATE LANCING DEVICE) MISC 1 each by Other route 2 (two) times daily.  09/11/13  Yes Historical Provider, MD  lisinopril (PRINIVIL,ZESTRIL) 2.5 MG tablet Take 1 tablet (2.5 mg total) by mouth every morning. 07/17/14  Yes Kathleene Hazel, MD  Propylene Glycol (SYSTANE BALANCE OP) Apply 1 drop to eye 3 (three) times daily as needed (dry eyes).   Yes Historical Provider, MD  sodium chloride (OCEAN) 0.65 % SOLN nasal spray Place 2 sprays into both nostrils as needed for congestion.   Yes Historical Provider, MD   BP 151/81 mmHg  Pulse 69  Temp(Src) 97.4 F (36.3 C) (Oral)  Resp 20  SpO2 100% Physical Exam  Constitutional: She is oriented to person, place, and time. She appears well-developed and well-nourished.  Demented, NAD   HENT:  Head: Normocephalic.  Mouth/Throat: Oropharynx is clear and moist.  Eyes: Conjunctivae are normal. Pupils are equal, round, and reactive to light.  Neck: Normal range of motion. Neck supple.  Cardiovascular: Normal rate, regular rhythm and normal heart sounds.   Pulmonary/Chest: Effort normal and breath sounds normal. No respiratory distress. She has no wheezes. She has no rales.  Abdominal: Soft. Bowel sounds are normal. She exhibits no distension. There is no tenderness. There is no rebound and no guarding.  Musculoskeletal: Normal range of motion. She exhibits no edema or tenderness.  Neurological: She is alert and oriented to person, place, and time. No cranial nerve deficit. Coordination normal.  Skin: Skin is warm and dry.  Psychiatric: She has a normal mood and affect. Her behavior is normal. Judgment and thought content normal.  Nursing note and vitals reviewed.   ED Course  Procedures (including critical care time) Labs Review Labs Reviewed  CBC WITH DIFFERENTIAL - Abnormal; Notable for the following:     RDW 16.2 (*)    All other components within normal limits  COMPREHENSIVE METABOLIC PANEL - Abnormal; Notable for the following:    GFR calc non Af Amer 68 (*)    GFR calc Af Amer 79 (*)    All other components within normal limits  URINALYSIS, ROUTINE W REFLEX MICROSCOPIC - Abnormal; Notable for the following:    APPearance CLOUDY (*)    Hgb urine dipstick SMALL (*)    Leukocytes, UA LARGE (*)    All other components within normal limits  VALPROIC ACID LEVEL - Abnormal; Notable for the following:    Valproic Acid Lvl 29.2 (*)    All other components within normal limits  URINE MICROSCOPIC-ADD ON - Abnormal; Notable for the following:  Squamous Epithelial / LPF FEW (*)    Bacteria, UA MANY (*)    Casts HYALINE CASTS (*)    All other components within normal limits  LIPASE, BLOOD  I-STAT TROPOININ, ED  I-STAT CG4 LACTIC ACID, ED    Imaging Review No results found.   EKG Interpretation None      MDM   Final diagnoses:  None    Dana [name] is a 69 y.o. female here with chronic intermittent diarrhea, headache, diaphoresis. Well appearing today. Has no complaints currently. She has several previous visits for similar complaints and had CT head several months ago and her neuro exam is nl currently. Labs unremarkable. Depakote level low, which is unlikely to contribute to her symptoms. UA + UTI, which she had before. Will dc home with keflex. Will have her f/u with PMD.      Richardean Canal, MD 07/30/14 [phone]

## 2014-07-30 NOTE — ED Notes (Signed)
Pt escorted from ED via wheelchair by this RN. Pt is stable upon d/c and will return home with husband. Pt/family verbalizes understanding rt d/c instructions and medications.

## 2014-07-30 NOTE — ED Notes (Signed)
Pt here for diarrhea, headache, diaphoresis.

## 2014-08-14 DIAGNOSIS — I10 Essential (primary) hypertension: Secondary | ICD-10-CM | POA: Diagnosis not present

## 2014-08-20 ENCOUNTER — Ambulatory Visit: Payer: Self-pay | Admitting: Endocrinology

## 2014-08-20 ENCOUNTER — Telehealth: Payer: Self-pay | Admitting: Endocrinology

## 2014-08-20 NOTE — Telephone Encounter (Signed)
Follow up advised. Contact patient and schedule visit in 2 weeks. 

## 2014-08-20 NOTE — Telephone Encounter (Signed)
Patient no showed today's appt. Please advise on how to follow up. °A. No follow up necessary. °B. Follow up urgent. Contact patient immediately. °C. Follow up necessary. Contact patient and schedule visit in ___ days. °D. Follow up advised. Contact patient and schedule visit in ____weeks. ° °

## 2014-08-21 NOTE — Telephone Encounter (Signed)
Pt rescheduled for 08/27/2014 at 315 pm.

## 2014-08-27 ENCOUNTER — Encounter: Payer: Self-pay | Admitting: Endocrinology

## 2014-08-27 ENCOUNTER — Ambulatory Visit (INDEPENDENT_AMBULATORY_CARE_PROVIDER_SITE_OTHER): Payer: Commercial Managed Care - HMO | Admitting: Endocrinology

## 2014-08-27 VITALS — BP 126/74 | HR 62 | Temp 98.4°F | Ht 63.0 in | Wt 174.0 lb

## 2014-08-27 DIAGNOSIS — Z23 Encounter for immunization: Secondary | ICD-10-CM

## 2014-08-27 DIAGNOSIS — E109 Type 1 diabetes mellitus without complications: Secondary | ICD-10-CM | POA: Diagnosis not present

## 2014-08-27 DIAGNOSIS — Z0000 Encounter for general adult medical examination without abnormal findings: Secondary | ICD-10-CM | POA: Diagnosis not present

## 2014-08-27 LAB — LIPID PANEL
Cholesterol: 170 mg/dL (ref 0–200)
HDL: 77 mg/dL (ref >39.00)
LDL CALC: 72 mg/dL (ref 0–99)
NonHDL: 93
TRIGLYCERIDES: 103 mg/dL (ref 0.0–149.0)
Total CHOL/HDL Ratio: 2
VLDL: 20.6 mg/dL (ref 0.0–40.0)

## 2014-08-27 LAB — HEMOGLOBIN A1C: HEMOGLOBIN A1C: 8.5 % — AB (ref 4.6–6.5)

## 2014-08-27 LAB — TSH: TSH: 1.35 u[IU]/mL (ref 0.35–4.50)

## 2014-08-27 MED ORDER — ISOSORBIDE MONONITRATE ER 30 MG PO TB24
30.0000 mg | ORAL_TABLET | Freq: Every day | ORAL | Status: DC
Start: 2014-08-27 — End: 2014-11-22

## 2014-08-27 NOTE — Progress Notes (Signed)
Subjective:    Patient ID: Dana [name], female    DOB: [birth year], 69 y.o.   MRN: [id number]  HPI Pt is here for regular wellness examination, and is feeling pretty well in general, and says chronic med probs are stable, except as noted below Past Medical History  Diagnosis Date  . ALLERGIC RHINITIS 02/08/2007  . ANXIETY 02/08/2007  . DEPRESSION 02/08/2007  . DIABETES MELLITUS, TYPE I 02/08/2007  . Headache(784.0) 11/06/2008  . HYPERCHOLESTEROLEMIA 11/05/2008  . HYPERTENSION 02/08/2007  . URINARY INCONTINENCE 02/08/2007  . Dyslipidemia   . Non-ulcer dyspepsia   . Leukopenia   . DM retinopathy     background  . 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 Dementia   . CHF (congestive heart failure)   . Anemia     takes iron  . Skin abnormality     both knees with opne ares and left shoulder with open areas, changes dressings prn    Past Surgical History  Procedure Laterality Date  . Total knee arthroplasty  1990    left  . Panendoscopy  10/27/1999  . Stress cardiolite  12/14/2003  . Electrocardiogram  05/17/2006  . Abdominal hysterectomy    . Bladder tach  years ago  . Colonoscopy with propofol N/A 06/21/2014    Procedure: COLONOSCOPY WITH PROPOFOL;  Surgeon: Rachael Fee, MD;  Location: [facility];  Service: Endoscopy;  Laterality: N/A;    History   Social History  . Marital Status: Married    Spouse Name: N/A  . Number of Children: N/A  . Years of Education: N/A   Occupational History  . Retired    Social History Main Topics  . Smoking status: Never Smoker   . Smokeless tobacco: Never Used  . Alcohol Use: No  . Drug Use: No  . Sexual Activity: No   Other Topics Concern  . Not on file   Social History Narrative    Current Outpatient Prescriptions on File Prior to Visit  Medication Sig Dispense Refill  . ACCU-CHEK SMARTVIEW test strip 1 each by Other route 2 (two) times daily.     Marland Kitchen aspirin EC 325 MG tablet Take 325 mg by mouth every morning.      Marland Kitchen atorvastatin (LIPITOR) 20 MG tablet TAKE 1 TABLET DAILY. 90 tablet 0  . carvedilol (COREG) 3.125 MG tablet Take 1 tablet (3.125 mg total) by mouth 2 (two) times daily with a meal. 60 tablet 6  . citalopram (CELEXA) 20 MG tablet TAKE 1 TABLET (20 MG TOTAL) BY MOUTH DAILY. 30 tablet 3  . divalproex (DEPAKOTE ER) 250 MG 24 hr tablet     . donepezil (ARICEPT) 10 MG tablet Take 10 mg by mouth at bedtime.    . ferrous sulfate 325 (65 FE) MG tablet Take 325 mg by mouth daily with breakfast.    . furosemide (LASIX) 40 MG tablet Take 40 mg by mouth 3 (three) times daily. TWO IN THE MORNING AND ON IN THE AFTERNOON    . Insulin Lispro Prot & Lispro (HUMALOG MIX 50/50 KWIKPEN) (50-50) 100 UNIT/ML Kwikpen Inject 30 Units into the skin every morning.     . Insulin Pen Needle (B-D ULTRAFINE III SHORT PEN) 31G X 8 MM MISC Use 2 per day. (Patient taking differently: 1 each by Other route 2 (two) times daily. Use 2 per day.) 100 each 6  . Lancet Devices (ADVOCATE LANCING DEVICE) MISC 1 each by Other route 2 (two)  times daily.     Marland Kitchen. lisinopril (PRINIVIL,ZESTRIL) 2.5 MG tablet Take 1 tablet (2.5 mg total) by mouth every morning. 30 tablet 6  . Propylene Glycol (SYSTANE BALANCE OP) Apply 1 drop to eye 3 (three) times daily as needed (dry eyes).    . sodium chloride (OCEAN) 0.65 % SOLN nasal spray Place 2 sprays into both nostrils as needed for congestion.     No current facility-administered medications on file prior to visit.    Allergies  Allergen Reactions  . Aspirin Nausea Only  . Codeine Nausea Only    Family History  Problem Relation Age of Onset  . Alcohol abuse Son   . Cancer Neg Hx     no cancer in immediate family  . CAD Father     BP 126/74 mmHg  Pulse 62  Temp(Src) 98.4 F (36.9 C) (Oral)  Ht 5\' 3"  (1.6 m)  Wt 174 lb (78.926 kg)  BMI 30.83 kg/m2  SpO2 93%  Review of Systems  Constitutional: Negative for fever.  HENT: Negative for hearing loss.   Eyes: Negative for visual  disturbance.  Respiratory: Negative for shortness of breath.   Cardiovascular: Negative for palpitations.  Gastrointestinal: Negative for anal bleeding.  Endocrine: Positive for cold intolerance.  Genitourinary: Negative for hematuria.  Musculoskeletal: Positive for gait problem.  Skin: Negative for wound.  Allergic/Immunologic: Positive for environmental allergies.  Neurological: Negative for syncope and numbness.  Hematological: Bruises/bleeds easily.  Psychiatric/Behavioral: Negative for behavioral problems.       Objective:   Physical Exam VS: see vs page GEN: no distress.  In wheelchair HEAD: head: no deformity eyes: no periorbital swelling, no proptosis external nose and ears are normal mouth: no lesion seen NECK: supple, thyroid is not enlarged CHEST WALL: no deformity LUNGS:  Clear to auscultation CV: reg rate and rhythm, no murmur ABD: abdomen is soft, nontender.  no hepatosplenomegaly.  not distended.  no hernia MUSCULOSKELETAL: muscle bulk and strength are grossly normal.  no obvious joint swelling.   PULSES: no carotid bruit NEURO:  cn 2-12 grossly intact.   readily moves all 4's.  SKIN:  Normal texture and temperature.  No rash or suspicious lesion is visible.   NODES:  None palpable at the neck PSYCH: alert; oriented to self only.  Does not appear anxious nor depressed.        Assessment & Plan:  Wellness visit today, with problems stable, except as noted. [facility].      SEPARATE EVALUATION FOLLOWS--EACH PROBLEM HERE IS NEW, NOT RESPONDING TO TREATMENT, OR POSES SIGNIFICANT RISK TO THE PATIENT'S HEALTH: HISTORY OF THE PRESENT ILLNESS: Pt returns for f/u of diabetes mellitus: DM type: 2 Dx'ed: 1987 Complications: none Therapy: insulin  GDM: never DKA: never Severe hypoglycemia: never Pancreatitis: never Other: therapy has been limited by severe noncompliance and dementia; husband gives her insulin injections (she therefore  needs a simple qd insulin regimen), and he also checks cbg's; she was admitted in march of 2014, with severe hyperglycemia. Interval history: no cbg record, but states cbg's vary from 180-200.  Husband says pt has intermittent chest pain, but pt is unable to elaborate.  PAST MEDICAL HISTORY reviewed and up to date today REVIEW OF SYSTEMS: Husband denies hypoglycemia and weight change PHYSICAL EXAMINATION: VITAL SIGNS:  See vs page GENERAL: no distress Pulses: dorsalis pedis intact bilat.   MSK: no deformity of the feet CV: no leg edema Skin:  no ulcer on the feet.  normal color and temp on the feet. Neuro: sensation is intact to touch on the feet IMPRESSION: DM: uncertain control Chest pain, exacerbation. Dementia limits history PLAN: check labs i rx'ed imdur

## 2014-08-27 NOTE — Patient Instructions (Addendum)
Please come back for a follow-up appointment in 3 months.   blood tests are being requested for you today.  We'll let you know about the results. [facility] [phone], to make an appointment for a mammogram.  please consider these measures for your health:  minimize alcohol.  do not use tobacco products.  have a colonoscopy at least every 10 years from age 69.  keep firearms safely stored.  always use seat belts.  have working smoke alarms in your home.  see an eye doctor and dentist regularly.   it is critically important to prevent falling down (keep floor areas well-lit, dry, and free of loose objects.  If you have a cane, walker, or wheelchair, you should use it, even for short trips around the house.  Also, try not to rush).

## 2014-08-27 NOTE — Progress Notes (Signed)
we discussed code status.  pt requests full code, but would not want to be started or maintained on artificial life-support measures if there was not a reasonable chance of recovery 

## 2014-08-30 ENCOUNTER — Other Ambulatory Visit: Payer: Self-pay | Admitting: Endocrinology

## 2014-09-14 DIAGNOSIS — I10 Essential (primary) hypertension: Secondary | ICD-10-CM | POA: Diagnosis not present

## 2014-09-30 ENCOUNTER — Other Ambulatory Visit: Payer: Self-pay | Admitting: Cardiovascular Disease

## 2014-10-01 ENCOUNTER — Other Ambulatory Visit: Payer: Self-pay | Admitting: Endocrinology

## 2014-10-08 DIAGNOSIS — F0390 Unspecified dementia without behavioral disturbance: Secondary | ICD-10-CM | POA: Diagnosis not present

## 2014-10-13 DIAGNOSIS — I10 Essential (primary) hypertension: Secondary | ICD-10-CM | POA: Diagnosis not present

## 2014-10-29 ENCOUNTER — Other Ambulatory Visit: Payer: Self-pay | Admitting: Endocrinology

## 2014-11-13 DIAGNOSIS — I10 Essential (primary) hypertension: Secondary | ICD-10-CM | POA: Diagnosis not present

## 2014-11-19 ENCOUNTER — Telehealth: Payer: Self-pay | Admitting: Endocrinology

## 2014-11-19 NOTE — Telephone Encounter (Signed)
Pt hisband called and would like megan to call @ [phone]

## 2014-11-20 NOTE — Telephone Encounter (Signed)
Please advise ov today or tomorrow

## 2014-11-20 NOTE — Telephone Encounter (Signed)
I contacted the pt's husband and advised of note below. He agreed to bring patient in on 11/22/2014 at 11 am.

## 2014-11-20 NOTE — Telephone Encounter (Addendum)
I contacted the patients husband. He stated for the last couple of weeks the patient has been experiencing severe nausea and diarrhea. Also, the husband thinks the patient is having mini seizures. Pt's husband stated he has noticed for about a month the patinet will start sweating really bad and her eyes will roll back and she will be unconscious for 2-3 mins. The patients husband believes the Isosorbide was the cause of these episodes. Pt has stopped taking this medication and he states the episodes have stopped. The husband wanted you to be notified.  Please advise, Thanks!

## 2014-11-22 ENCOUNTER — Ambulatory Visit (INDEPENDENT_AMBULATORY_CARE_PROVIDER_SITE_OTHER): Payer: Commercial Managed Care - HMO | Admitting: Endocrinology

## 2014-11-22 ENCOUNTER — Encounter: Payer: Self-pay | Admitting: Endocrinology

## 2014-11-22 ENCOUNTER — Telehealth: Payer: Self-pay | Admitting: Endocrinology

## 2014-11-22 VITALS — BP 132/80 | HR 66 | Temp 98.4°F | Ht 63.0 in | Wt 175.0 lb

## 2014-11-22 DIAGNOSIS — G988 Other disorders of nervous system: Secondary | ICD-10-CM | POA: Insufficient documentation

## 2014-11-22 DIAGNOSIS — D649 Anemia, unspecified: Secondary | ICD-10-CM

## 2014-11-22 DIAGNOSIS — E1165 Type 2 diabetes mellitus with hyperglycemia: Secondary | ICD-10-CM

## 2014-11-22 DIAGNOSIS — IMO0002 Reserved for concepts with insufficient information to code with codable children: Secondary | ICD-10-CM

## 2014-11-22 DIAGNOSIS — R197 Diarrhea, unspecified: Secondary | ICD-10-CM | POA: Diagnosis not present

## 2014-11-22 LAB — IBC PANEL
Iron: 72 ug/dL (ref 42–145)
Saturation Ratios: 21.8 % (ref 20.0–50.0)
TRANSFERRIN: 236 mg/dL (ref 212.0–360.0)

## 2014-11-22 LAB — CBC WITH DIFFERENTIAL/PLATELET
BASOS ABS: 0 10*3/uL (ref 0.0–0.1)
BASOS PCT: 0.8 % (ref 0.0–3.0)
Eosinophils Absolute: 0.2 10*3/uL (ref 0.0–0.7)
Eosinophils Relative: 4.5 % (ref 0.0–5.0)
HCT: 34.8 % — ABNORMAL LOW (ref 36.0–46.0)
Hemoglobin: 11.9 g/dL — ABNORMAL LOW (ref 12.0–15.0)
Lymphocytes Relative: 38.2 % (ref 12.0–46.0)
Lymphs Abs: 2 10*3/uL (ref 0.7–4.0)
MCHC: 34.1 g/dL (ref 30.0–36.0)
MCV: 80.4 fl (ref 78.0–100.0)
MONO ABS: 0.5 10*3/uL (ref 0.1–1.0)
Monocytes Relative: 8.8 % (ref 3.0–12.0)
Neutro Abs: 2.5 10*3/uL (ref 1.4–7.7)
Neutrophils Relative %: 47.7 % (ref 43.0–77.0)
PLATELETS: 216 10*3/uL (ref 150.0–400.0)
RBC: 4.33 Mil/uL (ref 3.87–5.11)
RDW: 16.7 % — AB (ref 11.5–15.5)
WBC: 5.2 10*3/uL (ref 4.0–10.5)

## 2014-11-22 LAB — BASIC METABOLIC PANEL
BUN: 21 mg/dL (ref 6–23)
CO2: 30 mEq/L (ref 19–32)
Calcium: 9.3 mg/dL (ref 8.4–10.5)
Chloride: 101 mEq/L (ref 96–112)
Creatinine, Ser: 0.86 mg/dL (ref 0.40–1.20)
GFR: 83.71 mL/min (ref >60.00)
GLUCOSE: 221 mg/dL — AB (ref 70–99)
POTASSIUM: 4.2 meq/L (ref 3.5–5.1)
Sodium: 138 mEq/L (ref 135–145)

## 2014-11-22 LAB — MAGNESIUM: MAGNESIUM: 1.9 mg/dL (ref 1.5–2.5)

## 2014-11-22 LAB — PHOSPHORUS: PHOSPHORUS: 3.5 mg/dL (ref 2.3–4.6)

## 2014-11-22 LAB — HEMOGLOBIN A1C: Hgb A1c MFr Bld: 8.1 % — ABNORMAL HIGH (ref 4.6–6.5)

## 2014-11-22 NOTE — Telephone Encounter (Signed)
See not below to be advised.

## 2014-11-22 NOTE — Progress Notes (Signed)
Subjective:    Patient ID: Dana [name], female    DOB: 04/13/1946, 69 y.o.   MRN: [id number]  HPI Pt returns for f/u of diabetes mellitus: DM type: 2 Dx'ed: 1987 Complications: none Therapy: insulin since approx 1990 GDM: never DKA: never Severe hypoglycemia: never Pancreatitis: never Other: therapy has been limited by severe noncompliance and dementia; husband gives her insulin injections (she therefore needs a simple qd insulin regimen), and he also checks cbg's; she was admitted in march of 2014, with severe hyperglycemia.  Interval history: no cbg record, but states cbg's vary from 180-200's.  There is no trend throughout the day.  Husband states few mos of moderate intermittent diarrhea, but no pain at the abdomen.  She has assoc nausea, but seldom has vomiting.  No recent abx.  Husband stopped Ismo a few weeks ago, but this did not help.   Husband describes "seizures."   He says this is "eyes rolling back in her head," x a few minutes at a time.   Past Medical History  Diagnosis Date  . ALLERGIC RHINITIS 02/08/2007  . ANXIETY 02/08/2007  . DEPRESSION 02/08/2007  . DIABETES MELLITUS, TYPE I 02/08/2007  . Headache(784.0) 11/06/2008  . HYPERCHOLESTEROLEMIA 11/05/2008  . HYPERTENSION 02/08/2007  . URINARY INCONTINENCE 02/08/2007  . Dyslipidemia   . Non-ulcer dyspepsia   . Leukopenia   . DM retinopathy     background  . 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 Dementia   . CHF (congestive heart failure)   . Anemia     takes iron  . Skin abnormality     both knees with opne ares and left shoulder with open areas, changes dressings prn    Past Surgical History  Procedure Laterality Date  . Total knee arthroplasty  1990    left  . Panendoscopy  10/27/1999  . Stress cardiolite  12/14/2003  . Electrocardiogram  05/17/2006  . Abdominal hysterectomy    . Bladder tach  years ago  . Colonoscopy with propofol N/A 06/21/2014    Procedure: COLONOSCOPY WITH PROPOFOL;   Surgeon: Rachael Feeaniel P Jacobs, MD;  Location: [facility];  Service: Endoscopy;  Laterality: N/A;    History   Social History  . Marital Status: Married    Spouse Name: N/A  . Number of Children: N/A  . Years of Education: N/A   Occupational History  . Retired    Social History Main Topics  . Smoking status: Never Smoker   . Smokeless tobacco: Never Used  . Alcohol Use: No  . Drug Use: No  . Sexual Activity: No   Other Topics Concern  . Not on file   Social History Narrative    Current Outpatient Prescriptions on File Prior to Visit  Medication Sig Dispense Refill  . ACCU-CHEK SMARTVIEW test strip 1 each by Other route 2 (two) times daily.     Marland Kitchen. aspirin EC 325 MG tablet Take 325 mg by mouth every morning.    Marland Kitchen. atorvastatin (LIPITOR) 20 MG tablet TAKE 1 TABLET DAILY. 90 tablet 0  . carvedilol (COREG) 3.125 MG tablet Take 1 tablet (3.125 mg total) by mouth 2 (two) times daily with a meal. 60 tablet 6  . citalopram (CELEXA) 20 MG tablet TAKE 1 TABLET (20 MG TOTAL) BY MOUTH DAILY. 30 tablet 3  . divalproex (DEPAKOTE ER) 250 MG 24 hr tablet     . donepezil (ARICEPT) 10 MG tablet Take 10 mg by mouth at bedtime.    .Marland Kitchen  ferrous sulfate 325 (65 FE) MG tablet Take 325 mg by mouth daily with breakfast.    . furosemide (LASIX) 40 MG tablet Take 40 mg by mouth 3 (three) times daily. TWO IN THE MORNING AND ON IN THE AFTERNOON    . Insulin Lispro Prot & Lispro (HUMALOG MIX 50/50 KWIKPEN) (50-50) 100 UNIT/ML Kwikpen Inject 40 Units into the skin every morning.     . Insulin Pen Needle (B-D ULTRAFINE III SHORT PEN) 31G X 8 MM MISC Use 2 per day. (Patient taking differently: 1 each by Other route 2 (two) times daily. Use 2 per day.) 100 each 6  . Lancet Devices (ADVOCATE LANCING DEVICE) MISC 1 each by Other route 2 (two) times daily.     Marland Kitchen. lisinopril (PRINIVIL,ZESTRIL) 2.5 MG tablet Take 1 tablet (2.5 mg total) by mouth every morning. 30 tablet 6  . Propylene Glycol (SYSTANE BALANCE OP) Apply 1  drop to eye 3 (three) times daily as needed (dry eyes).    . sodium chloride (OCEAN) 0.65 % SOLN nasal spray Place 2 sprays into both nostrils as needed for congestion.     No current facility-administered medications on file prior to visit.    Allergies  Allergen Reactions  . Aspirin Nausea Only  . Codeine Nausea Only    Family History  Problem Relation Age of Onset  . Alcohol abuse Son   . Cancer Neg Hx     no cancer in immediate family  . CAD Father     BP 132/80 mmHg  Pulse 66  Temp(Src) 98.4 F (36.9 C) (Oral)  Ht 5\' 3"  (1.6 m)  Wt 175 lb (79.379 kg)  BMI 31.01 kg/m2  SpO2 94%   Review of Systems Denies fever and BRBPR    Objective:   Physical Exam VITAL SIGNS:  See vs page GENERAL: no distress in wheelchair ABDOMEN: abdomen is soft, nontender.  no hepatosplenomegaly.  not distended.  no hernia.  PSYCH: Alert and oriented to self and place.  Does not appear anxious nor depressed.    Lab Results  Component Value Date   HGBA1C 8.1* 11/22/2014      Assessment & Plan:  Syncope or near-syncopal episodes, new, uncertain etiology. DM: we should not increase insulin now, due to ? About syncope Diarrhea, chronic, uncertain etiology.    Patient is advised the following: Patient Instructions  Please come back for a follow-up appointment in 3 months.   blood tests are being requested for you today.  We'll let you know about the results. Please see 2 specialists.  you will receive a phone call, about days and times for appointments.

## 2014-11-22 NOTE — Patient Instructions (Addendum)
Please come back for a follow-up appointment in 3 months.   blood tests are being requested for you today.  We'll let you know about the results. Please see 2 specialists.  you will receive a phone call, about days and times for appointments.

## 2014-11-22 NOTE — Telephone Encounter (Signed)
Pt husband called and wanted to let Dr. Everardo AllEllison know that Mrs. Dana [name] has apt with Dr. Gerilyn PilgrimJacob @ 12/10/2014 @ 0930

## 2014-11-23 ENCOUNTER — Other Ambulatory Visit: Payer: Self-pay | Admitting: Endocrinology

## 2014-11-23 ENCOUNTER — Telehealth: Payer: Self-pay | Admitting: Endocrinology

## 2014-11-23 NOTE — Telephone Encounter (Signed)
Thank you. Therefore, please do not increase insulin. Please continue the same (35 units qam)

## 2014-11-23 NOTE — Telephone Encounter (Signed)
I contacted the pt's husband and advised of note the below. He voiced understanding.

## 2014-11-23 NOTE — Telephone Encounter (Signed)
please call patient's husband. Whenever pt has an episode of "seizure" (eyes rolling back), please check cbg. Please call and tell us what the cbg was

## 2014-11-23 NOTE — Telephone Encounter (Signed)
I contacted the pt's husband and advised of note below. He stated that he has been checking her blood sugar readings during these episodes. He states her sugar would range from 50-100.

## 2014-11-25 ENCOUNTER — Other Ambulatory Visit: Payer: Self-pay | Admitting: Endocrinology

## 2014-11-25 ENCOUNTER — Other Ambulatory Visit: Payer: Self-pay | Admitting: Cardiovascular Disease

## 2014-11-26 ENCOUNTER — Ambulatory Visit: Payer: Self-pay | Admitting: Endocrinology

## 2014-11-26 DIAGNOSIS — Z0289 Encounter for other administrative examinations: Secondary | ICD-10-CM

## 2014-12-11 ENCOUNTER — Ambulatory Visit: Payer: Self-pay | Admitting: Physician Assistant

## 2014-12-13 DIAGNOSIS — I10 Essential (primary) hypertension: Secondary | ICD-10-CM | POA: Diagnosis not present

## 2014-12-21 ENCOUNTER — Other Ambulatory Visit: Payer: Self-pay | Admitting: Endocrinology

## 2014-12-21 NOTE — Telephone Encounter (Signed)
Please advise if ok to refill rx. Medication is listed under a historical provider. Thanks! 

## 2014-12-27 ENCOUNTER — Ambulatory Visit (INDEPENDENT_AMBULATORY_CARE_PROVIDER_SITE_OTHER): Payer: Commercial Managed Care - HMO | Admitting: Physician Assistant

## 2014-12-27 ENCOUNTER — Encounter: Payer: Self-pay | Admitting: Physician Assistant

## 2014-12-27 VITALS — BP 144/66 | HR 68 | Ht 63.0 in | Wt 140.0 lb

## 2014-12-27 DIAGNOSIS — R197 Diarrhea, unspecified: Secondary | ICD-10-CM

## 2014-12-27 MED ORDER — HYOSCYAMINE SULFATE 0.125 MG SL SUBL: SUBLINGUAL_TABLET | SUBLINGUAL | Status: DC

## 2014-12-27 NOTE — Patient Instructions (Signed)
We sent a prescription to [facility] [address] 1. Levsin sublingual tablets.  Take Benefiber- 1 heaping tablespoon in 8 oz of water , do twice daily. Take Imodium first thing in the morning, hold if you become constipated.  We have given you a High fiber diet, and low fat diet brochure.

## 2014-12-27 NOTE — Progress Notes (Signed)
Patient ID: Dana [name], female   DOB: Nov 18, 1945, 69 y.o.   MRN: [id number]     History of Present Illness: Dana [name] is a pleasant 69 year old female with memory deficit due to Alzheimer's dementia. She is accompanied by her husband. Patient is able to provide recent history. Patient is known to Dr. Christella Hartigan from prior evaluation for diarrhea. His last seen in November 2015 and had a colonoscopy in December 2015 terminal ileum was normal, there were a few scattered diverticulum through the colon, the exam was otherwise normal. Biopsies were normal. Patient and her husband state that she has had intermittent diarrhea for about 7 months. She has one or 2 mushy bowel movements each day. He will often eat and then an hour or so later have some lower abdominal cramping followed by a bowel movement. Her cramping is alleviated with defecation. She has had no bright red blood per rectum or melena. No oily stools. Her appetite has been good and her weight has been stable.  On dietary review with the patient's husband cooks for the patient, the patient gets very little in the way of fiber in her diet. Her typical breakfasts consist of several strips of bacon and buttered grits or cheesy grits. Lunch is typically a sandwich with cheese and chips,  and supper is usually chicken, fish, or pork with greens. She will usually have canned peaches daily as well. She drinks several glasses of white grape juice or apple juice daily as well.   Past Medical History  Diagnosis Date  . ALLERGIC RHINITIS 02/08/2007  . ANXIETY 02/08/2007  . DEPRESSION 02/08/2007  . DIABETES MELLITUS, TYPE I 02/08/2007  . Headache(784.0) 11/06/2008  . HYPERCHOLESTEROLEMIA 11/05/2008  . HYPERTENSION 02/08/2007  . URINARY INCONTINENCE 02/08/2007  . Dyslipidemia   . Non-ulcer dyspepsia   . Leukopenia   . DM retinopathy     background  . 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 Dementia   . CHF (congestive heart failure)   .  Anemia     takes iron  . Skin abnormality     both knees with opne ares and left shoulder with open areas, changes dressings prn    Past Surgical History  Procedure Laterality Date  . Total knee arthroplasty  1990    left  . Panendoscopy  10/27/1999  . Stress cardiolite  12/14/2003  . Electrocardiogram  05/17/2006  . Abdominal hysterectomy    . Bladder tach  years ago  . Colonoscopy with propofol N/A 06/21/2014    Procedure: COLONOSCOPY WITH PROPOFOL;  Surgeon: Rachael Fee, MD;  Location: [facility];  Service: Endoscopy;  Laterality: N/A;   Family History  Problem Relation Age of Onset  . Alcohol abuse Son   . Cancer Neg Hx     no cancer in immediate family  . CAD Father   . Colon cancer Neg Hx   . Colon polyps Neg Hx   . Diabetes Father   . Kidney disease Neg Hx   . Esophageal cancer Neg Hx   . Gallbladder disease Neg Hx    History  Substance Use Topics  . Smoking status: Never Smoker   . Smokeless tobacco: Never Used  . Alcohol Use: No   Current Outpatient Prescriptions  Medication Sig Dispense Refill  . ACCU-CHEK SMARTVIEW test strip 1 each by Other route 2 (two) times daily.     Marland Kitchen aspirin EC 325 MG tablet TAKE 1 TABLET (325 MG TOTAL)  BY MOUTH DAILY. 30 tablet 1  . atorvastatin (LIPITOR) 20 MG tablet TAKE 1 TABLET DAILY. 90 tablet 0  . carvedilol (COREG) 3.125 MG tablet Take 1 tablet (3.125 mg total) by mouth 2 (two) times daily with a meal. 60 tablet 6  . citalopram (CELEXA) 20 MG tablet TAKE 1 TABLET (20 MG TOTAL) BY MOUTH DAILY. 30 tablet 3  . divalproex (DEPAKOTE ER) 250 MG 24 hr tablet     . donepezil (ARICEPT) 10 MG tablet Take 10 mg by mouth at bedtime.    . ferrous sulfate 325 (65 FE) MG tablet Take 325 mg by mouth as needed.     . furosemide (LASIX) 40 MG tablet Take 40 mg by mouth 3 (three) times daily. TWO IN THE MORNING AND ONE IN THE AFTERNOON    . Insulin Lispro Prot & Lispro (HUMALOG MIX 50/50 KWIKPEN) (50-50) 100 UNIT/ML Kwikpen Inject 35 Units  into the skin every morning.     . Insulin Pen Needle (B-D ULTRAFINE III SHORT PEN) 31G X 8 MM MISC Use 2 per day. (Patient taking differently: 1 each by Other route 2 (two) times daily. Use 2 per day.) 100 each 6  . Lancet Devices (ADVOCATE LANCING DEVICE) MISC 1 each by Other route 2 (two) times daily.     Marland Kitchen lisinopril (PRINIVIL,ZESTRIL) 2.5 MG tablet Take 1 tablet (2.5 mg total) by mouth every morning. 30 tablet 6  . Propylene Glycol (SYSTANE BALANCE OP) Apply 1 drop to eye 3 (three) times daily as needed (dry eyes).    . sodium chloride (OCEAN) 0.65 % SOLN nasal spray Place 2 sprays into both nostrils as needed for congestion.    . hyoscyamine (LEVSIN/SL) 0.125 MG SL tablet Place 1 tablet under the tongue twice daily as needed for cramps and spasms. 60 tablet 0   No current facility-administered medications for this visit.   Allergies  Allergen Reactions  . Aspirin Nausea Only  . Codeine Nausea Only      Review of Systems: Per history of present illness otherwise negative    Physical Exam: General: Pleasant, well developed  female in no acute distress in a wheelchair Head: Normocephalic and atraumatic Eyes:  sclerae anicteric, conjunctiva pink  Ears: Normal auditory acuity Lungs: Clear throughout to auscultation Heart: Regular rate and rhythm Abdomen: Soft, non distended, non-tender. No masses, no hepatomegaly. Normal bowel sounds Musculoskeletal: Symmetrical with no gross deformities  Extremities: No edema  Neurological: Alert oriented x 4, grossly nonfocal Psychological:  Alert and cooperative. Normal mood and affect  Assessment and Recommendations: 69 year old female with a 7-8 month history of intermittent diarrhea here for follow-up. Patient has been instructed to use Benefiber a heaping tablespoon in 8 ounces of water at breakfast and again later in the morning before lunch. This will hopefully add bulk to her stool. He has been instructed to try to decrease the amount  of fatty foods that she takes in each day. She will also decrease the amount of apple juice and white creep juice that she drinks on a daily basis as these can cause loose stools as well. She has been reminded to use Imodium shortly after she wakes in the morning to prevent diarrhea. She was instructed to hold or stop the Imodium if she becomes constipated. She will also be given a trial of Levsin sublingual 0.125 mg 1 under tongue twice a day when necessary cramps. She will follow-up in 3 months, sooner if needed.   Brazil Voytko, Tollie Pizza PA-C 12/27/2014,

## 2014-12-28 NOTE — Progress Notes (Signed)
i agree with the above note, plan 

## 2015-01-05 ENCOUNTER — Other Ambulatory Visit: Payer: Self-pay | Admitting: Endocrinology

## 2015-01-13 DIAGNOSIS — I10 Essential (primary) hypertension: Secondary | ICD-10-CM | POA: Diagnosis not present

## 2015-01-28 ENCOUNTER — Telehealth: Payer: Self-pay | Admitting: Neurology

## 2015-01-28 ENCOUNTER — Ambulatory Visit: Payer: Self-pay | Admitting: Neurology

## 2015-01-31 NOTE — Telephone Encounter (Signed)
error 

## 2015-02-12 DIAGNOSIS — I10 Essential (primary) hypertension: Secondary | ICD-10-CM | POA: Diagnosis not present

## 2015-02-15 DIAGNOSIS — F0390 Unspecified dementia without behavioral disturbance: Secondary | ICD-10-CM | POA: Diagnosis not present

## 2015-02-26 ENCOUNTER — Ambulatory Visit: Payer: Self-pay | Admitting: Endocrinology

## 2015-02-26 ENCOUNTER — Encounter (HOSPITAL_COMMUNITY): Payer: Self-pay | Admitting: Emergency Medicine

## 2015-02-26 ENCOUNTER — Emergency Department (HOSPITAL_COMMUNITY): Payer: Commercial Managed Care - HMO

## 2015-02-26 ENCOUNTER — Inpatient Hospital Stay (HOSPITAL_COMMUNITY)
Admission: EM | Admit: 2015-02-26 | Discharge: 2015-02-28 | DRG: 638 | Disposition: A | Discharge status: Skilled Nursing Facility | Payer: Commercial Managed Care - HMO | Attending: Internal Medicine | Admitting: Internal Medicine

## 2015-02-26 ENCOUNTER — Telehealth: Payer: Self-pay | Admitting: Endocrinology

## 2015-02-26 DIAGNOSIS — Z885 Allergy status to narcotic agent status: Secondary | ICD-10-CM | POA: Diagnosis not present

## 2015-02-26 DIAGNOSIS — E10319 Type 1 diabetes mellitus with unspecified diabetic retinopathy without macular edema: Secondary | ICD-10-CM | POA: Diagnosis present

## 2015-02-26 DIAGNOSIS — I429 Cardiomyopathy, unspecified: Secondary | ICD-10-CM | POA: Diagnosis not present

## 2015-02-26 DIAGNOSIS — E785 Hyperlipidemia, unspecified: Secondary | ICD-10-CM | POA: Diagnosis present

## 2015-02-26 DIAGNOSIS — Z833 Family history of diabetes mellitus: Secondary | ICD-10-CM | POA: Diagnosis not present

## 2015-02-26 DIAGNOSIS — Z7982 Long term (current) use of aspirin: Secondary | ICD-10-CM

## 2015-02-26 DIAGNOSIS — E1065 Type 1 diabetes mellitus with hyperglycemia: Secondary | ICD-10-CM | POA: Diagnosis not present

## 2015-02-26 DIAGNOSIS — F419 Anxiety disorder, unspecified: Secondary | ICD-10-CM | POA: Diagnosis present

## 2015-02-26 DIAGNOSIS — G4089 Other seizures: Secondary | ICD-10-CM | POA: Diagnosis not present

## 2015-02-26 DIAGNOSIS — Z96652 Presence of left artificial knee joint: Secondary | ICD-10-CM | POA: Diagnosis present

## 2015-02-26 DIAGNOSIS — R112 Nausea with vomiting, unspecified: Secondary | ICD-10-CM | POA: Diagnosis present

## 2015-02-26 DIAGNOSIS — I5022 Chronic systolic (congestive) heart failure: Secondary | ICD-10-CM | POA: Diagnosis present

## 2015-02-26 DIAGNOSIS — Z8249 Family history of ischemic heart disease and other diseases of the circulatory system: Secondary | ICD-10-CM | POA: Diagnosis not present

## 2015-02-26 DIAGNOSIS — E876 Hypokalemia: Secondary | ICD-10-CM

## 2015-02-26 DIAGNOSIS — Z794 Long term (current) use of insulin: Secondary | ICD-10-CM | POA: Diagnosis not present

## 2015-02-26 DIAGNOSIS — E10649 Type 1 diabetes mellitus with hypoglycemia without coma: Secondary | ICD-10-CM | POA: Diagnosis not present

## 2015-02-26 DIAGNOSIS — E780 Pure hypercholesterolemia: Secondary | ICD-10-CM | POA: Diagnosis present

## 2015-02-26 DIAGNOSIS — R1110 Vomiting, unspecified: Secondary | ICD-10-CM | POA: Diagnosis not present

## 2015-02-26 DIAGNOSIS — I5020 Unspecified systolic (congestive) heart failure: Secondary | ICD-10-CM | POA: Diagnosis not present

## 2015-02-26 DIAGNOSIS — G309 Alzheimer's disease, unspecified: Secondary | ICD-10-CM | POA: Diagnosis not present

## 2015-02-26 DIAGNOSIS — E162 Hypoglycemia, unspecified: Secondary | ICD-10-CM | POA: Diagnosis not present

## 2015-02-26 DIAGNOSIS — I10 Essential (primary) hypertension: Secondary | ICD-10-CM | POA: Diagnosis present

## 2015-02-26 DIAGNOSIS — M6281 Muscle weakness (generalized): Secondary | ICD-10-CM | POA: Diagnosis not present

## 2015-02-26 DIAGNOSIS — E872 Acidosis: Secondary | ICD-10-CM | POA: Diagnosis present

## 2015-02-26 DIAGNOSIS — R531 Weakness: Secondary | ICD-10-CM | POA: Diagnosis not present

## 2015-02-26 DIAGNOSIS — F0280 Dementia in other diseases classified elsewhere without behavioral disturbance: Secondary | ICD-10-CM | POA: Diagnosis not present

## 2015-02-26 DIAGNOSIS — Z79899 Other long term (current) drug therapy: Secondary | ICD-10-CM

## 2015-02-26 DIAGNOSIS — F329 Major depressive disorder, single episode, unspecified: Secondary | ICD-10-CM | POA: Diagnosis present

## 2015-02-26 DIAGNOSIS — F0390 Unspecified dementia without behavioral disturbance: Secondary | ICD-10-CM

## 2015-02-26 DIAGNOSIS — R197 Diarrhea, unspecified: Secondary | ICD-10-CM | POA: Diagnosis not present

## 2015-02-26 DIAGNOSIS — M1990 Unspecified osteoarthritis, unspecified site: Secondary | ICD-10-CM | POA: Diagnosis present

## 2015-02-26 DIAGNOSIS — R6889 Other general symptoms and signs: Secondary | ICD-10-CM | POA: Diagnosis not present

## 2015-02-26 DIAGNOSIS — E119 Type 2 diabetes mellitus without complications: Secondary | ICD-10-CM | POA: Diagnosis not present

## 2015-02-26 DIAGNOSIS — R262 Difficulty in walking, not elsewhere classified: Secondary | ICD-10-CM | POA: Diagnosis not present

## 2015-02-26 DIAGNOSIS — Z886 Allergy status to analgesic agent status: Secondary | ICD-10-CM

## 2015-02-26 DIAGNOSIS — IMO0002 Reserved for concepts with insufficient information to code with codable children: Secondary | ICD-10-CM | POA: Diagnosis present

## 2015-02-26 DIAGNOSIS — R1111 Vomiting without nausea: Secondary | ICD-10-CM | POA: Diagnosis not present

## 2015-02-26 DIAGNOSIS — F339 Major depressive disorder, recurrent, unspecified: Secondary | ICD-10-CM | POA: Diagnosis not present

## 2015-02-26 DIAGNOSIS — E1165 Type 2 diabetes mellitus with hyperglycemia: Secondary | ICD-10-CM | POA: Diagnosis not present

## 2015-02-26 DIAGNOSIS — R55 Syncope and collapse: Secondary | ICD-10-CM | POA: Diagnosis not present

## 2015-02-26 LAB — URINALYSIS, ROUTINE W REFLEX MICROSCOPIC
Bilirubin Urine: NEGATIVE
Glucose, UA: 100 mg/dL — AB
Ketones, ur: NEGATIVE mg/dL
Leukocytes, UA: NEGATIVE
Nitrite: NEGATIVE
Protein, ur: NEGATIVE mg/dL
Specific Gravity, Urine: 1.018 (ref 1.005–1.030)
Urobilinogen, UA: 0.2 mg/dL (ref 0.0–1.0)
pH: 5.5 (ref 5.0–8.0)

## 2015-02-26 LAB — CBG MONITORING, ED
GLUCOSE-CAPILLARY: 47 mg/dL — AB (ref 65–99)
Glucose-Capillary: 63 mg/dL — ABNORMAL LOW (ref 65–99)

## 2015-02-26 LAB — GLUCOSE, CAPILLARY
GLUCOSE-CAPILLARY: 278 mg/dL — AB (ref 65–99)
Glucose-Capillary: 143 mg/dL — ABNORMAL HIGH (ref 65–99)

## 2015-02-26 LAB — CBC WITH DIFFERENTIAL/PLATELET
Basophils Absolute: 0.1 10*3/uL (ref 0.0–0.1)
Basophils Relative: 1 % (ref 0–1)
EOS ABS: 0.3 10*3/uL (ref 0.0–0.7)
Eosinophils Relative: 5 % (ref 0–5)
HEMATOCRIT: 39.5 % (ref 36.0–46.0)
Hemoglobin: 13 g/dL (ref 12.0–15.0)
Lymphocytes Relative: 45 % (ref 12–46)
Lymphs Abs: 2.6 10*3/uL (ref 0.7–4.0)
MCH: 27.7 pg (ref 26.0–34.0)
MCHC: 32.9 g/dL (ref 30.0–36.0)
MCV: 84.2 fL (ref 78.0–100.0)
Monocytes Absolute: 0.5 10*3/uL (ref 0.1–1.0)
Monocytes Relative: 10 % (ref 3–12)
NEUTROS ABS: 2.2 10*3/uL (ref 1.7–7.7)
Neutrophils Relative %: 39 % — ABNORMAL LOW (ref 43–77)
PLATELETS: 213 10*3/uL (ref 150–400)
RBC: 4.69 MIL/uL (ref 3.87–5.11)
RDW: 15 % (ref 11.5–15.5)
WBC: 5.7 10*3/uL (ref 4.0–10.5)

## 2015-02-26 LAB — COMPREHENSIVE METABOLIC PANEL
ALBUMIN: 3.9 g/dL (ref 3.5–5.0)
ALK PHOS: 66 U/L (ref 38–126)
ALT: 28 U/L (ref 14–54)
AST: 23 U/L (ref 15–41)
Anion gap: 12 (ref 5–15)
BUN: 23 mg/dL — AB (ref 6–20)
CHLORIDE: 102 mmol/L (ref 101–111)
CO2: 29 mmol/L (ref 22–32)
Calcium: 9.8 mg/dL (ref 8.9–10.3)
Creatinine, Ser: 0.82 mg/dL (ref 0.44–1.00)
GFR calc Af Amer: >60 mL/min (ref >60)
GFR calc non Af Amer: >60 mL/min (ref >60)
GLUCOSE: 40 mg/dL — AB (ref 65–99)
Potassium: 3.1 mmol/L — ABNORMAL LOW (ref 3.5–5.1)
SODIUM: 143 mmol/L (ref 135–145)
Total Bilirubin: 1.1 mg/dL (ref 0.3–1.2)
Total Protein: 7.4 g/dL (ref 6.5–8.1)

## 2015-02-26 LAB — I-STAT CG4 LACTIC ACID, ED: Lactic Acid, Venous: 2.09 mmol/L (ref 0.5–2.0)

## 2015-02-26 LAB — LACTIC ACID, PLASMA
LACTIC ACID, VENOUS: 1.5 mmol/L (ref 0.5–2.0)
Lactic Acid, Venous: 1 mmol/L (ref 0.5–2.0)

## 2015-02-26 LAB — URINE MICROSCOPIC-ADD ON

## 2015-02-26 LAB — VALPROIC ACID LEVEL: Valproic Acid Lvl: 28 ug/mL — ABNORMAL LOW (ref 50.0–100.0)

## 2015-02-26 LAB — MRSA PCR SCREENING: MRSA BY PCR: NEGATIVE

## 2015-02-26 LAB — LIPASE, BLOOD: LIPASE: 28 U/L (ref 22–51)

## 2015-02-26 MED ORDER — DEXTROSE 50 % IV SOLN
INTRAVENOUS | Status: AC
  Administered 2015-02-26: 50 mL
  Filled 2015-02-26: qty 50

## 2015-02-26 MED ORDER — MAGNESIUM SULFATE 2 GM/50ML IV SOLN
2.0000 g | Freq: Once | INTRAVENOUS | Status: AC
  Administered 2015-02-26: 2 g via INTRAVENOUS
  Filled 2015-02-26: qty 50

## 2015-02-26 MED ORDER — INSULIN ASPART 100 UNIT/ML ~~LOC~~ SOLN
0.0000 [IU] | Freq: Three times a day (TID) | SUBCUTANEOUS | Status: DC
Start: 2015-02-27 — End: 2015-02-28
  Administered 2015-02-27: 3 [IU] via SUBCUTANEOUS
  Administered 2015-02-27: 2 [IU] via SUBCUTANEOUS
  Administered 2015-02-27: 3 [IU] via SUBCUTANEOUS
  Administered 2015-02-28 (×2): 5 [IU] via SUBCUTANEOUS

## 2015-02-26 MED ORDER — CITALOPRAM HYDROBROMIDE 20 MG PO TABS
20.0000 mg | ORAL_TABLET | Freq: Every day | ORAL | Status: DC
  Administered 2015-02-27 – 2015-02-28 (×2): 20 mg via ORAL
  Filled 2015-02-26 (×2): qty 1

## 2015-02-26 MED ORDER — VANCOMYCIN HCL IN DEXTROSE 1-5 GM/200ML-% IV SOLN
1000.0000 mg | Freq: Once | INTRAVENOUS | Status: AC
  Administered 2015-02-26: 1000 mg via INTRAVENOUS
  Filled 2015-02-26: qty 200

## 2015-02-26 MED ORDER — SODIUM CHLORIDE 0.9 % IV BOLUS (SEPSIS)
500.0000 mL | Freq: Once | INTRAVENOUS | Status: AC
  Administered 2015-02-26: 500 mL via INTRAVENOUS

## 2015-02-26 MED ORDER — ENOXAPARIN SODIUM 40 MG/0.4ML ~~LOC~~ SOLN
40.0000 mg | SUBCUTANEOUS | Status: DC
Start: 2015-02-26 — End: 2015-02-28
  Administered 2015-02-26 – 2015-02-27 (×2): 40 mg via SUBCUTANEOUS
  Filled 2015-02-26 (×2): qty 0.4

## 2015-02-26 MED ORDER — POTASSIUM CHLORIDE 10 MEQ/100ML IV SOLN
10.0000 meq | INTRAVENOUS | Status: AC
  Administered 2015-02-26 (×2): 10 meq via INTRAVENOUS
  Filled 2015-02-26 (×2): qty 100

## 2015-02-26 MED ORDER — CARVEDILOL 3.125 MG PO TABS
3.1250 mg | ORAL_TABLET | Freq: Two times a day (BID) | ORAL | Status: DC
  Administered 2015-02-27 – 2015-02-28 (×3): 3.125 mg via ORAL
  Filled 2015-02-26 (×3): qty 1

## 2015-02-26 MED ORDER — VANCOMYCIN 50 MG/ML ORAL SOLUTION
125.0000 mg | Freq: Four times a day (QID) | ORAL | Status: DC
  Administered 2015-02-26 – 2015-02-27 (×2): 125 mg via ORAL
  Filled 2015-02-26 (×6): qty 2.5

## 2015-02-26 MED ORDER — SODIUM CHLORIDE 0.9 % IV BOLUS (SEPSIS): 1000.0000 mL | Freq: Once | INTRAVENOUS | Status: DC

## 2015-02-26 MED ORDER — LORAZEPAM 0.5 MG PO TABS
0.5000 mg | ORAL_TABLET | Freq: Once | ORAL | Status: AC
  Administered 2015-02-26: 0.5 mg via ORAL
  Filled 2015-02-26: qty 1

## 2015-02-26 MED ORDER — KCL IN DEXTROSE-NACL 20-5-0.9 MEQ/L-%-% IV SOLN
INTRAVENOUS | Status: DC
  Administered 2015-02-26: 22:00:00 via INTRAVENOUS
  Administered 2015-02-26: 75 mL/h via INTRAVENOUS
  Filled 2015-02-26 (×2): qty 1000

## 2015-02-26 MED ORDER — ONDANSETRON HCL 4 MG/2ML IJ SOLN
4.0000 mg | Freq: Once | INTRAMUSCULAR | Status: AC
  Administered 2015-02-26: 4 mg via INTRAVENOUS
  Filled 2015-02-26: qty 2

## 2015-02-26 MED ORDER — ACETAMINOPHEN 325 MG PO TABS
650.0000 mg | ORAL_TABLET | Freq: Four times a day (QID) | ORAL | Status: DC | PRN
  Administered 2015-02-26: 650 mg via ORAL
  Filled 2015-02-26: qty 2

## 2015-02-26 MED ORDER — VANCOMYCIN HCL 500 MG IV SOLR: 500.0000 mg | Freq: Two times a day (BID) | INTRAVENOUS | Status: DC

## 2015-02-26 MED ORDER — DONEPEZIL HCL 10 MG PO TABS
10.0000 mg | ORAL_TABLET | Freq: Every day | ORAL | Status: DC
  Administered 2015-02-26 – 2015-02-27 (×2): 10 mg via ORAL
  Filled 2015-02-26 (×2): qty 1

## 2015-02-26 MED ORDER — DEXTROSE 50 % IV SOLN
1.0000 | Freq: Once | INTRAVENOUS | Status: AC
  Administered 2015-02-26: 50 mL via INTRAVENOUS

## 2015-02-26 MED ORDER — PIPERACILLIN-TAZOBACTAM 3.375 G IVPB 30 MIN
3.3750 g | Freq: Once | INTRAVENOUS | Status: AC
  Administered 2015-02-26: 3.375 g via INTRAVENOUS
  Filled 2015-02-26: qty 50

## 2015-02-26 MED ORDER — DIPHENHYDRAMINE HCL 50 MG/ML IJ SOLN
12.5000 mg | Freq: Once | INTRAMUSCULAR | Status: AC
  Administered 2015-02-26: 12.5 mg via INTRAVENOUS
  Filled 2015-02-26: qty 1

## 2015-02-26 MED ORDER — PIPERACILLIN-TAZOBACTAM 3.375 G IVPB
3.3750 g | Freq: Three times a day (TID) | INTRAVENOUS | Status: DC
  Administered 2015-02-26 – 2015-02-27 (×2): 3.375 g via INTRAVENOUS
  Filled 2015-02-26 (×3): qty 50

## 2015-02-26 MED ORDER — DIVALPROEX SODIUM ER 250 MG PO TB24
250.0000 mg | ORAL_TABLET | Freq: Every morning | ORAL | Status: DC
  Administered 2015-02-27 – 2015-02-28 (×2): 250 mg via ORAL
  Filled 2015-02-26 (×2): qty 1

## 2015-02-26 NOTE — H&P (Addendum)
History and Physical  Dana [name] UJW:[id number] DOB: [birth year] DOA: 02/26/2015  Referring physician: EDP PCP: Romero Belling, MD   Chief Complaint:  N/v/d, syncope vs seizure activity  HPI: Dana [name] is a 69 y.o. female  With h/o dementia living at home with husband, h/o insulin dependent DM2, chf, last known ef 25-30%, was brought to [facility] by EMS. Patient not able to provide history, hpi obtained from chart review and talking to husband. Per husband, patient was on the toilet, started to compliant not feeling well, then vomited and passed out for 3-58mins, husband observed patient was shaky with foam around mouth and eye rolling, husband reported patient was more confused after waking up, he called EMS, EMS found patient covered with vomit and feces, blood sugar by EMS 107. Patient was brought to the ED.  ED course: vital stable, blood sugar in the 40's, she received d50 brought blood sugar in to mid 60's. Lab showed lactic acidosis, iv vanc/zosyn ordered, hospitalist called to admit the patient.  husband reported patient now is close to baseline. Denies recent abx use, no fever.    Review of Systems:  Detail per HPI, Review of systems are otherwise negative  Past Medical History  Diagnosis Date  . ALLERGIC RHINITIS 02/08/2007  . ANXIETY 02/08/2007  . DEPRESSION 02/08/2007  . DIABETES MELLITUS, TYPE I 02/08/2007  . Headache(784.0) 11/06/2008  . HYPERCHOLESTEROLEMIA 11/05/2008  . HYPERTENSION 02/08/2007  . URINARY INCONTINENCE 02/08/2007  . Dyslipidemia   . Non-ulcer dyspepsia   . Leukopenia   . DM retinopathy     background  . 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 Dementia   . CHF (congestive heart failure)   . Anemia     takes iron  . Skin abnormality     both knees with opne ares and left shoulder with open areas, changes dressings prn   Past Surgical History  Procedure Laterality Date  . Total knee arthroplasty  1990    left  . Panendoscopy  10/27/1999  .  Stress cardiolite  12/14/2003  . Electrocardiogram  05/17/2006  . Abdominal hysterectomy    . Bladder tach  years ago  . Colonoscopy with propofol N/A 06/21/2014    Procedure: COLONOSCOPY WITH PROPOFOL;  Surgeon: Rachael Fee, MD;  Location: [facility];  Service: Endoscopy;  Laterality: N/A;   Social History:  reports that she has never smoked. She has never used smokeless tobacco. She reports that she does not drink alcohol or use illicit drugs. Patient lives at home & is able to participate in activities of daily living with help by husband  Allergies  Allergen Reactions  . Aspirin Nausea Only  . Codeine Nausea Only    Family History  Problem Relation Age of Onset  . Alcohol abuse Son   . Cancer Neg Hx     no cancer in immediate family  . CAD Father   . Colon cancer Neg Hx   . Colon polyps Neg Hx   . Diabetes Father   . Kidney disease Neg Hx   . Esophageal cancer Neg Hx   . Gallbladder disease Neg Hx       Prior to Admission medications   Medication Sig Start Date End Date Taking? Authorizing Provider  aspirin EC 325 MG tablet TAKE 1 TABLET (325 MG TOTAL) BY MOUTH DAILY. 11/26/14  Yes Kathleene Hazel, MD  atorvastatin (LIPITOR) 20 MG tablet TAKE 1 TABLET DAILY. 01/07/15  Yes Gregary Signs  Everardo All, MD  carvedilol (COREG) 3.125 MG tablet Take 1 tablet (3.125 mg total) by mouth 2 (two) times daily with a meal. 07/17/14  Yes Kathleene Hazel, MD  citalopram (CELEXA) 20 MG tablet TAKE 1 TABLET (20 MG TOTAL) BY MOUTH DAILY. 10/29/14  Yes Romero Belling, MD  divalproex (DEPAKOTE ER) 250 MG 24 hr tablet Take 250 mg by mouth every morning.  07/09/14  Yes Historical Provider, MD  donepezil (ARICEPT) 10 MG tablet Take 10 mg by mouth at bedtime.   Yes Historical Provider, MD  ferrous sulfate 325 (65 FE) MG tablet Take 325 mg by mouth.    Yes Historical Provider, MD  furosemide (LASIX) 40 MG tablet Take 40-80 mg by mouth 2 (two) times daily. TWO IN THE MORNING AND ONE IN THE AFTERNOON    Yes Historical Provider, MD  lisinopril (PRINIVIL,ZESTRIL) 2.5 MG tablet Take 1 tablet (2.5 mg total) by mouth every morning. 07/17/14  Yes Kathleene Hazel, MD  memantine [facility] TITRATION PACK) tablet pack Take by mouth See admin instructions. 5 mg/day for =1 week; 5 mg twice daily for =1 week; 15 mg/day given in 5 mg and 10 mg separated doses for =1 week; then 10 mg twice daily   Yes Historical Provider, MD  hyoscyamine (LEVSIN/SL) 0.125 MG SL tablet Place 1 tablet under the tongue twice daily as needed for cramps and spasms. 12/27/14   Lori P Hvozdovic, PA-C  Insulin Lispro Prot & Lispro (HUMALOG MIX 50/50 KWIKPEN) (50-50) 100 UNIT/ML Kwikpen Inject 35 Units into the skin every morning.     Historical Provider, MD  Propylene Glycol (SYSTANE BALANCE OP) Apply 1 drop to eye 3 (three) times daily as needed (dry eyes).    Historical Provider, MD  sodium chloride (OCEAN) 0.65 % SOLN nasal spray Place 2 sprays into both nostrils as needed for congestion.    Historical Provider, MD    Physical Exam: BP 156/59 mmHg  Pulse 64  Temp(Src) 96.4 F (35.8 C) (Rectal)  Resp 16  Wt 63.5 kg (139 lb 15.9 oz)  SpO2 99%  General:  NAD Eyes: PERRL ENT: unremarkable Neck: supple, no JVD Cardiovascular: RRR Respiratory: CTABL Abdomen: soft/ND/ND, positive bowel sounds Skin: no rash Musculoskeletal:  No edema Psychiatric: calm/cooperative Neurologic: demented elderly female, only oriented to person, no focal findings          Labs on Admission:  Basic Metabolic Panel:  Recent Labs Lab 02/26/15 1409  NA 143  K 3.1*  CL 102  CO2 29  GLUCOSE 40*  BUN 23*  CREATININE 0.82  CALCIUM 9.8   Liver Function Tests:  Recent Labs Lab 02/26/15 1409  AST 23  ALT 28  ALKPHOS 66  BILITOT 1.1  PROT 7.4  ALBUMIN 3.9    Recent Labs Lab 02/26/15 1409  LIPASE 28   No results for input(s): AMMONIA in the last 168 hours. CBC:  Recent Labs Lab 02/26/15 1409  WBC 5.7  NEUTROABS 2.2    HGB 13.0  HCT 39.5  MCV 84.2  PLT 213   Cardiac Enzymes: No results for input(s): CKTOTAL, CKMB, CKMBINDEX, TROPONINI in the last 168 hours.  BNP (last 3 results) No results for input(s): BNP in the last 8760 hours.  ProBNP (last 3 results)  Recent Labs  06/18/14 1553  PROBNP 148.0*    CBG:  Recent Labs Lab 02/26/15 1606  GLUCAP 63*    Radiological Exams on Admission: Dg Chest Port 1 View  02/26/2015   CLINICAL DATA:  69 year old female with weakness, hypothermia, found down, vomiting, diarrhea, diaphoresis. Initial encounter.  EXAM: PORTABLE CHEST - 1 VIEW  COMPARISON:  06/11/2014 and earlier.  FINDINGS: Portable AP semi upright view at 1450 hours. Normal cardiac size and mediastinal contours. Calcified atherosclerosis of the aorta. No pneumothorax or pleural effusion. Allowing for portable technique, the lungs are clear. No acute osseous abnormality identified.  IMPRESSION: No acute cardiopulmonary abnormality.   Electronically Signed   By: Odessa Fleming M.D.   On: 02/26/2015 15:00    EKG: Independently reviewed. Sinus rhythm, chronic incomplete LBBB, no acute st/t changes, QTc prolonged   Assessment/Plan Present on Admission:  . Syncope  Syncope vs seizure activity vs hypoglycemia episode:  Per husband ,patient was on the toilet, reported feeling nauseated, then passed out for 3-4 mins, husband reported seeing foaming around her mouth and shakiness and eye rolling, husband reported patient was more confused after waking up, now close to baseline after arrival to the ED. Will continue tele to r/o arrhythmia, check echo, neurology consulted for possible seizure.   N/V/D: incontinence from seizure? Viral etiology? Gastroparesis? Husband reported n/v has been chronic. No observed vomiting or diarrhea in the ED, husband denies recent antibiotic use, c diff pending, empirically on oral vanc, d/c oral vanc if  c diff negative.  Lactic acidosis: no leukocytosis, no fever, ua/cxr no  infection. c diff pending. Hold home diuretics, gentle hydration, currently look dry. Trend lactic acid. Received vanc, zosyn in the ED, will d/c iv vanc, continue zosyn for now, f/u on cultures.  H/o systolic chf: last know LVEF 25-30% in 2041, repeat echo pending, currenly look dry on gentle hydration, close monitor volume status.  Hypoglycemia: persistent hypoglycemia in the ED , s/p two amp of d50, will start on d5/ns/20k at 75cc/hr, on hypoglycemia protocol. Close monitor volume status due to h/o chf.  IDDM2, check A1c, husband reported blood sugar at home frequently in the 50-60's, lowest in the 30's. Likely will need to lower home insulin dose, for now on SSI, hypoglycemic protocol.  Dementia: continue aricept, husband reported patient was recently started on Namenda. She is also on depakote at home.   DVT prophylaxis: lovenox  Consultants: neurology, talked to Dr Leroy Kennedy   Code Status: full , confirmed with husband  Family Communication:  Patient and husband  Disposition Plan: admit to tele  Time spent:  Layonna Dobie MD, PhD [facility] Pager [phone] If 7PM-7AM, please contact night-coverage at [email or web address], password [facility]

## 2015-02-26 NOTE — Care Management Note (Signed)
Case Management Note  Patient Details  Name: Dana [name] MRN: [id number] Date of Birth: [birth year]  Subjective/Objective:         69 yr old female covered by [facility] c/o n/v/d -- CM consult to provided information on medicaid, home health and private duty nursing services. Spouse states he does not know how to use the Internet but his daughter does He states "our income may be too high"    Action/Plan:  Spoke with pt and husband about home health, Private duty and medicaid Encouraged going to [employer] or medicaid.gov to do medicaid application Cm discussed the spend down process in general and referred him to DSS staff to discuss in detail CM reviewed in details medicare guidelines, home health [facility]) (length of stay in home, types of [facility] staff available, coverage, primary caregiver, up to 24 hrs before services may be started) and Private duty nursing (PDN-coverage, length of stay in the home types of staff available). CM reviewed availability of HH SW to assist pcp to get pt to snf (if desired disposition) from the community level. CM provided pt/family with a list of [place] home health agencies and PDN.   Discussed pt to be further evaluated by unit therapists (PT/OT) for recommendation of level of care and share this with attending MD and unit CM   Expected Discharge Date:   (unknown) March 01 2015   Expected Discharge Plan:    Pending   In-House Referral:   SW   Discharge planning Services   pending    Status of Service:   completed    Additional Comments:  Ophelia Shoulder, RN 02/26/2015, 4:38 PM

## 2015-02-26 NOTE — ED Notes (Signed)
Gave I Stat CG4  Critical result to MD Little.

## 2015-02-26 NOTE — ED Notes (Signed)
MD at bedside. 

## 2015-02-26 NOTE — ED Notes (Signed)
MD at bedside. NEURO MD PRESENT

## 2015-02-26 NOTE — Telephone Encounter (Signed)
Mr. Vidrine called to inform Dr. Everardo All that Dana [name] was admitted to the hospital   Please advise    Thank you

## 2015-02-26 NOTE — Consult Note (Signed)
NEURO HOSPITALIST CONSULT NOTE   Referring physician: Dr Erlinda Hong Reason for Consult: seizures versus syncope  HPI:                                                                                                                                          Dana [name] is an 69 y.o. female with a past medical history significant for advanced dementia, HTN, hypercholesterolemia, DM type 2, chronic congestive heart failure, brought in via EMS for evaluation of a transient episode of LOC at home. Husband is at the bedside and informs me that she had had 4 or 5 similar episodes before but today seemed to be " stronger". He said that prior episodes have been associated with low blood sugar, and he recalls one episode with blood sugar in the low 30's. He said that today patient was on the toilet, became sweaty, pale, nauseated, very weak, passed out and went to the floor. As per husband, her eyes were rolling back and she was making a gurgling sound at the same time that was having some body jerkiness. She was incontinent of stools, and by the time EMS arrived was still confused.EMS found patient covered with vomit and feces, blood sugar by EMS 107. Patient was brought to the ED. ED course: vital stable, blood sugar in the 40's, she received d50 brought blood sugar in to mid 60's. Lab showed lactic acidosis, iv vanc/zosyn ordered. Husband reported patient now is close to baseline.  She had a recent fall at home but no fever or infection. Serologies reveal normal white count and electrolytes.   Past Medical History  Diagnosis Date  . ALLERGIC RHINITIS 02/08/2007  . ANXIETY 02/08/2007  . DEPRESSION 02/08/2007  . DIABETES MELLITUS, TYPE I 02/08/2007  . Headache(784.0) 11/06/2008  . HYPERCHOLESTEROLEMIA 11/05/2008  . HYPERTENSION 02/08/2007  . URINARY INCONTINENCE 02/08/2007  . Dyslipidemia   . Non-ulcer dyspepsia   . Leukopenia   . DM retinopathy     background  . OA (osteoarthritis) 1990     disabled due to OA  . Dementia   . CHF (congestive heart failure)   . Anemia     takes iron  . Skin abnormality     both knees with opne ares and left shoulder with open areas, changes dressings prn    Past Surgical History  Procedure Laterality Date  . Total knee arthroplasty  1990    left  . Panendoscopy  10/27/1999  . Stress cardiolite  12/14/2003  . Electrocardiogram  05/17/2006  . Abdominal hysterectomy    . Bladder tach  years ago  . Colonoscopy with propofol N/A 06/21/2014    Procedure: COLONOSCOPY WITH PROPOFOL;  Surgeon: Milus Banister, MD;  Location: [facility];  Service: Endoscopy;  Laterality: N/A;  Family History  Problem Relation Age of Onset  . Alcohol abuse Son   . Cancer Neg Hx     no cancer in immediate family  . CAD Father   . Colon cancer Neg Hx   . Colon polyps Neg Hx   . Diabetes Father   . Kidney disease Neg Hx   . Esophageal cancer Neg Hx   . Gallbladder disease Neg Hx     Family History: as per husband, no epilepsy, MS, brain tumor, or brain anaeurysm   Social History:  reports that she has never smoked. She has never used smokeless tobacco. She reports that she does not drink alcohol or use illicit drugs.  Allergies  Allergen Reactions  . Aspirin Nausea Only  . Codeine Nausea Only    MEDICATIONS:                                                                                                                     I have reviewed the patient's current medications.   ROS: unable to obtain due to advanced dementia.                                                                                                                                       History obtained from chart review and husband    Physical exam: pleasant female in no apparent distress. Blood pressure 156/59, pulse 64, temperature 96.4 F (35.8 C), temperature source Rectal, resp. rate 16, weight 63.5 kg (139 lb 15.9 oz), SpO2 99 %. Eyes: no jaundice Head:  normocephalic. Neck: supple, no bruits, no JVD. Cardiac: no murmurs. Lungs: clear. Abdomen: soft, no tender, no mass. Extremities: no edema. Skin: no rash  Neurologic Examination:                                                                                                      General: Mental Status: Alert, awake, disoriented to year-month-day. Speech fluent without evidence of aphasia.  Able to follow simple  step commands without difficulty. Cranial Nerves: II: Discs flat bilaterally; Visual fields grossly normal, pupils equal, round, reactive to light and accommodation III,IV, VI: ptosis not present, extra-ocular motions intact bilaterally V,VII: smile symmetric, facial light touch sensation normal bilaterally VIII: hearing normal bilaterally IX,X: uvula rises symmetrically XI: bilateral shoulder shrug XII: midline tongue extension without atrophy or fasciculations Motor: Moves all limbs symmetrically Tone and bulk:normal tone throughout; no atrophy noted Sensory: Pinprick and light touch intact throughout, bilaterally Deep Tendon Reflexes:  1+ all over Plantars: Right: downgoing   Left: downgoing Cerebellar: Patent is not able to follow commands due to underlying dementia, and thus can not perform cerebellar testing Gait:  No tested due to altered mental status    Lab Results  Component Value Date/Time   CHOL 170 08/27/2014 04:15 PM    Results for orders placed or performed during the hospital encounter of 02/26/15 (from the past 48 hour(s))  Comprehensive metabolic panel     Status: Abnormal   Collection Time: 02/26/15  2:09 PM  Result Value Ref Range   Sodium 143 135 - 145 mmol/L   Potassium 3.1 (L) 3.5 - 5.1 mmol/L   Chloride 102 101 - 111 mmol/L   CO2 29 22 - 32 mmol/L   Glucose, Bld 40 (LL) 65 - 99 mg/dL    Comment: CRITICAL RESULT CALLED TO, READ BACK BY AND VERIFIED WITH: CARY HALL,RN [id number] @ 1446 BY J SCOTTON    BUN 23 (H) 6 - 20 mg/dL   Creatinine,  Ser 0.82 0.44 - 1.00 mg/dL   Calcium 9.8 8.9 - 10.3 mg/dL   Total Protein 7.4 6.5 - 8.1 g/dL   Albumin 3.9 3.5 - 5.0 g/dL   AST 23 15 - 41 U/L   ALT 28 14 - 54 U/L   Alkaline Phosphatase 66 38 - 126 U/L   Total Bilirubin 1.1 0.3 - 1.2 mg/dL   GFR calc non Af Amer >60 >60 mL/min   GFR calc Af Amer >60 >60 mL/min    Comment: (NOTE) The eGFR has been calculated using the CKD EPI equation. This calculation has not been validated in all clinical situations. eGFR's persistently <60 mL/min signify possible Chronic Kidney Disease.    Anion gap 12 5 - 15  Lipase, blood     Status: None   Collection Time: 02/26/15  2:09 PM  Result Value Ref Range   Lipase 28 22 - 51 U/L  CBC with Differential     Status: Abnormal   Collection Time: 02/26/15  2:09 PM  Result Value Ref Range   WBC 5.7 4.0 - 10.5 K/uL   RBC 4.69 3.87 - 5.11 MIL/uL   Hemoglobin 13.0 12.0 - 15.0 g/dL   HCT 39.5 36.0 - 46.0 %   MCV 84.2 78.0 - 100.0 fL   MCH 27.7 26.0 - 34.0 pg   MCHC 32.9 30.0 - 36.0 g/dL   RDW 15.0 11.5 - 15.5 %   Platelets 213 150 - 400 K/uL   Neutrophils Relative % 39 (L) 43 - 77 %   Neutro Abs 2.2 1.7 - 7.7 K/uL   Lymphocytes Relative 45 12 - 46 %   Lymphs Abs 2.6 0.7 - 4.0 K/uL   Monocytes Relative 10 3 - 12 %   Monocytes Absolute 0.5 0.1 - 1.0 K/uL   Eosinophils Relative 5 0 - 5 %   Eosinophils Absolute 0.3 0.0 - 0.7 K/uL   Basophils Relative 1 0 - 1 %   Basophils Absolute  0.1 0.0 - 0.1 K/uL  I-Stat CG4 Lactic Acid, ED     Status: Abnormal   Collection Time: 02/26/15  2:16 PM  Result Value Ref Range   Lactic Acid, Venous 2.09 (HH) 0.5 - 2.0 mmol/L   Comment NOTIFIED PHYSICIAN   Urinalysis, Routine w reflex microscopic (not at [facility])     Status: Abnormal   Collection Time: 02/26/15  4:03 PM  Result Value Ref Range   Color, Urine YELLOW YELLOW   APPearance CLEAR CLEAR   Specific Gravity, Urine 1.018 1.005 - 1.030   pH 5.5 5.0 - 8.0   Glucose, UA 100 (A) NEGATIVE mg/dL   Hgb urine dipstick  TRACE (A) NEGATIVE   Bilirubin Urine NEGATIVE NEGATIVE   Ketones, ur NEGATIVE NEGATIVE mg/dL   Protein, ur NEGATIVE NEGATIVE mg/dL   Urobilinogen, UA 0.2 0.0 - 1.0 mg/dL   Nitrite NEGATIVE NEGATIVE   Leukocytes, UA NEGATIVE NEGATIVE  Urine microscopic-add on     Status: None   Collection Time: 02/26/15  4:03 PM  Result Value Ref Range   RBC / HPF 0-2 <3 RBC/hpf   Urine-Other MUCOUS PRESENT   CBG monitoring, ED     Status: Abnormal   Collection Time: 02/26/15  4:06 PM  Result Value Ref Range   Glucose-Capillary 63 (L) 65 - 99 mg/dL    Dg Chest Port 1 View  02/26/2015   CLINICAL DATA:  69 year old female with weakness, hypothermia, found down, vomiting, diarrhea, diaphoresis. Initial encounter.  EXAM: PORTABLE CHEST - 1 VIEW  COMPARISON:  06/11/2014 and earlier.  FINDINGS: Portable AP semi upright view at 1450 hours. Normal cardiac size and mediastinal contours. Calcified atherosclerosis of the aorta. No pneumothorax or pleural effusion. Allowing for portable technique, the lungs are clear. No acute osseous abnormality identified.  IMPRESSION: No acute cardiopulmonary abnormality.   Electronically Signed   By: Genevie Ann M.D.   On: 02/26/2015 15:00   Assessment/Plan: 68 y/o with advanced dementia, admitted for evaluation of recurrent, transient episodes of acute alteration of consciousness with associated diaphoresis, vomiting, stool incontinence, and body jerkiness. Of note, patient with similar episodes in the past (last episode 1 week ago) in the context of low blood sugars. She is back to baseline. Agree that such episodes could be secondary to hypoglycemia, but at the same time dementia is a risk factor for seizures and thus ordered routine EEG. She has been on Depakote and also ordered valproic acid level. Will follow up.  Dorian Pod, MD 02/26/2015, 5:31 PM  [facility]

## 2015-02-26 NOTE — Progress Notes (Signed)
ANTIBIOTIC CONSULT NOTE - INITIAL  Pharmacy Consult for Vancomycin & Zosyn Indication: Sepsis  Allergies  Allergen Reactions  . Aspirin Nausea Only  . Codeine Nausea Only    Patient Measurements: Weight: 139 lb 15.9 oz (63.5 kg)  Vital Signs: Temp: 96.4 F (35.8 C) (08/09 1400) Temp Source: Rectal (08/09 1400) BP: 156/59 mmHg (08/09 1417) Pulse Rate: 64 (08/09 1417) Intake/Output from previous day:   Intake/Output from this shift:    Labs:  Recent Labs  02/26/15 1409  WBC 5.7  HGB 13.0  PLT 213  CREATININE 0.82   Estimated Creatinine Clearance: 56.4 mL/min (by C-G formula based on Cr of 0.82). No results for input(s): VANCOTROUGH, VANCOPEAK, VANCORANDOM, GENTTROUGH, GENTPEAK, GENTRANDOM, TOBRATROUGH, TOBRAPEAK, TOBRARND, AMIKACINPEAK, AMIKACINTROU, AMIKACIN in the last 72 hours.   Microbiology: No results found for this or any previous visit (from the past 720 hour(s)).  Medical History: Past Medical History  Diagnosis Date  . ALLERGIC RHINITIS 02/08/2007  . ANXIETY 02/08/2007  . DEPRESSION 02/08/2007  . DIABETES MELLITUS, TYPE I 02/08/2007  . Headache(784.0) 11/06/2008  . HYPERCHOLESTEROLEMIA 11/05/2008  . HYPERTENSION 02/08/2007  . URINARY INCONTINENCE 02/08/2007  . Dyslipidemia   . Non-ulcer dyspepsia   . Leukopenia   . DM retinopathy     background  . 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 Dementia   . CHF (congestive heart failure)   . Anemia     takes iron  . Skin abnormality     both knees with opne ares and left shoulder with open areas, changes dressings prn    Medications:  Scheduled:  . vancomycin  125 mg Oral 4 times per day   Infusions:  . magnesium sulfate 1 - 4 g bolus IVPB 2 g (02/26/15 1559)  . piperacillin-tazobactam 3.375 g (02/26/15 1548)  . piperacillin-tazobactam (ZOSYN)  IV    . potassium chloride    . sodium chloride 500 mL (02/26/15 1553)  . [START ON 02/27/2015] vancomycin    . vancomycin 1,000 mg (02/26/15  1552)   Assessment:  69 yr female with c/o vomiting and diarrhea, pale.   Code sepsis called  Blood and urine cultures ordered  Vanc 1gm given in ED @ 15:52 and Zosyn 3.375gm given in ED @ 15:48  Pharmacy consulted to continue dosing of IV Vancomycin and Zosyn for sepsis  Goal of Therapy:  Vancomycin trough level 15-20 mcg/ml  Plan:  Measure antibiotic drug levels at steady state Follow up culture results   Zosyn 3.375gm IV q8h (each dose infused over 4 hrs)  Vancomcyin  IV q12h  Maryellen Pile, PharmD 02/26/2015,4:09 PM

## 2015-02-26 NOTE — ED Notes (Signed)
Family at bedside. 

## 2015-02-26 NOTE — ED Notes (Addendum)
Chronic diarrhea- diaphoretic on scene however dry and pale at present. Pt with dementia however at baseline per husband. Pt presents pale. Pt took am medications however did vomit this am only undigested food.

## 2015-02-26 NOTE — ED Provider Notes (Addendum)
CSN: [id number]     Arrival date & time 02/26/15  1322 History   First MD Initiated Contact with Patient 02/26/15 1334     Chief Complaint  Patient presents with  . Emesis    diarrhea     (Consider location/radiation/quality/duration/timing/severity/associated sxs/prior Treatment) HPI Comments: 69 year old female with past medical history including insulin-dependent diabetes, hypertension, hyperlipidemia, CHF, dementia who presents with diarrhea and vomiting. History limited due to the patient's dementia and obtained primarily from EMS. EMS was called to the patient's home where they found her supine on the floor, covered in stool and emesis. She was noted to be diaphoretic and pale. Blood glucose unremarkable and vital signs normal. The patient currently denies any complaints.  Patient is a 69 y.o. female presenting with vomiting. The history is provided by the EMS personnel. The history is limited by the absence of a caregiver and the condition of the patient.  Emesis   Past Medical History  Diagnosis Date  . ALLERGIC RHINITIS 02/08/2007  . ANXIETY 02/08/2007  . DEPRESSION 02/08/2007  . DIABETES MELLITUS, TYPE I 02/08/2007  . Headache(784.0) 11/06/2008  . HYPERCHOLESTEROLEMIA 11/05/2008  . HYPERTENSION 02/08/2007  . URINARY INCONTINENCE 02/08/2007  . Dyslipidemia   . Non-ulcer dyspepsia   . Leukopenia   . DM retinopathy     background  . 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 Dementia   . CHF (congestive heart failure)   . Anemia     takes iron  . Skin abnormality     both knees with opne ares and left shoulder with open areas, changes dressings prn   Past Surgical History  Procedure Laterality Date  . Total knee arthroplasty  1990    left  . Panendoscopy  10/27/1999  . Stress cardiolite  12/14/2003  . Electrocardiogram  05/17/2006  . Abdominal hysterectomy    . Bladder tach  years ago  . Colonoscopy with propofol N/A 06/21/2014    Procedure: COLONOSCOPY WITH  PROPOFOL;  Surgeon: Rachael Fee, MD;  Location: [facility];  Service: Endoscopy;  Laterality: N/A;   Family History  Problem Relation Age of Onset  . Alcohol abuse Son   . Cancer Neg Hx     no cancer in immediate family  . CAD Father   . Colon cancer Neg Hx   . Colon polyps Neg Hx   . Diabetes Father   . Kidney disease Neg Hx   . Esophageal cancer Neg Hx   . Gallbladder disease Neg Hx    History  Substance Use Topics  . Smoking status: Never Smoker   . Smokeless tobacco: Never Used  . Alcohol Use: No   OB History    No data available     Review of Systems  Unable to perform ROS: Dementia  Gastrointestinal: Positive for vomiting.      Allergies  Aspirin and Codeine  Home Medications   Prior to Admission medications   Medication Sig Start Date End Date Taking? Authorizing Provider  aspirin EC 325 MG tablet TAKE 1 TABLET (325 MG TOTAL) BY MOUTH DAILY. 11/26/14  Yes Kathleene Hazel, MD  atorvastatin (LIPITOR) 20 MG tablet TAKE 1 TABLET DAILY. 01/07/15  Yes Romero Belling, MD  carvedilol (COREG) 3.125 MG tablet Take 1 tablet (3.125 mg total) by mouth 2 (two) times daily with a meal. 07/17/14  Yes Kathleene Hazel, MD  citalopram (CELEXA) 20 MG tablet TAKE 1 TABLET (20 MG TOTAL) BY MOUTH DAILY. 10/29/14  Yes Romero Belling, MD  divalproex (DEPAKOTE ER) 250 MG 24 hr tablet Take 250 mg by mouth every morning.  07/09/14  Yes Historical Provider, MD  donepezil (ARICEPT) 10 MG tablet Take 10 mg by mouth at bedtime.   Yes Historical Provider, MD  ferrous sulfate 325 (65 FE) MG tablet Take 325 mg by mouth.    Yes Historical Provider, MD  furosemide (LASIX) 40 MG tablet Take 40-80 mg by mouth 2 (two) times daily. TWO IN THE MORNING AND ONE IN THE AFTERNOON   Yes Historical Provider, MD  lisinopril (PRINIVIL,ZESTRIL) 2.5 MG tablet Take 1 tablet (2.5 mg total) by mouth every morning. 07/17/14  Yes Kathleene Hazel, MD  memantine [facility] TITRATION PACK) tablet pack  Take by mouth See admin instructions. 5 mg/day for =1 week; 5 mg twice daily for =1 week; 15 mg/day given in 5 mg and 10 mg separated doses for =1 week; then 10 mg twice daily   Yes Historical Provider, MD  hyoscyamine (LEVSIN/SL) 0.125 MG SL tablet Place 1 tablet under the tongue twice daily as needed for cramps and spasms. 12/27/14   Lori P Hvozdovic, PA-C  Insulin Lispro Prot & Lispro (HUMALOG MIX 50/50 KWIKPEN) (50-50) 100 UNIT/ML Kwikpen Inject 35 Units into the skin every morning.     Historical Provider, MD  Propylene Glycol (SYSTANE BALANCE OP) Apply 1 drop to eye 3 (three) times daily as needed (dry eyes).    Historical Provider, MD  sodium chloride (OCEAN) 0.65 % SOLN nasal spray Place 2 sprays into both nostrils as needed for congestion.    Historical Provider, MD   BP 156/59 mmHg  Pulse 64  Temp(Src) 96.4 F (35.8 C) (Rectal)  Resp 16  Wt 139 lb 15.9 oz (63.5 kg)  SpO2 99% Physical Exam  Constitutional: No distress.  Thin, frail-appearing woman wrapped in blankets, NAD  HENT:  Head: Normocephalic and atraumatic.  dry mucous membranes  Eyes: Conjunctivae are normal. Pupils are equal, round, and reactive to light.  Neck: Neck supple.  Cardiovascular: Normal rate, regular rhythm and normal heart sounds.   No murmur heard. Pulmonary/Chest: Effort normal and breath sounds normal.  Abdominal: Soft. Bowel sounds are normal. She exhibits no distension. There is no tenderness.  Musculoskeletal: She exhibits no edema.  Neurological: She is alert.  Oriented x1, able to follow basic commands, moves all 4 ext  Skin:  Scattered superficial ulcerations on legs and upper back, no sacral wounds or deep wounds  Psychiatric: She has a normal mood and affect. Judgment normal.  Nursing note and vitals reviewed.   ED Course  Procedures (including critical care time) Labs Review Labs Reviewed  COMPREHENSIVE METABOLIC PANEL - Abnormal; Notable for the following:    Potassium 3.1 (*)     Glucose, Bld 40 (*)    BUN 23 (*)    All other components within normal limits  CBC WITH DIFFERENTIAL/PLATELET - Abnormal; Notable for the following:    Neutrophils Relative % 39 (*)    All other components within normal limits  I-STAT CG4 LACTIC ACID, ED - Abnormal; Notable for the following:    Lactic Acid, Venous 2.09 (*)    All other components within normal limits  URINE CULTURE  CULTURE, BLOOD (ROUTINE X 2)  CULTURE, BLOOD (ROUTINE X 2)  LIPASE, BLOOD  URINALYSIS, ROUTINE W REFLEX MICROSCOPIC (NOT AT [facility])  CBG MONITORING, ED    Imaging Review Dg Chest Port 1 View  02/26/2015   CLINICAL DATA:  69 year old  female with weakness, hypothermia, found down, vomiting, diarrhea, diaphoresis. Initial encounter.  EXAM: PORTABLE CHEST - 1 VIEW  COMPARISON:  06/11/2014 and earlier.  FINDINGS: Portable AP semi upright view at 1450 hours. Normal cardiac size and mediastinal contours. Calcified atherosclerosis of the aorta. No pneumothorax or pleural effusion. Allowing for portable technique, the lungs are clear. No acute osseous abnormality identified.  IMPRESSION: No acute cardiopulmonary abnormality.   Electronically Signed   By: Odessa Fleming M.D.   On: 02/26/2015 15:00     EKG Interpretation   Date/Time:  Tuesday February 26 2015 14:05:49 EDT Ventricular Rate:  63 PR Interval:  170 QRS Duration: 112 QT Interval:  498 QTC Calculation: 510 R Axis:   -35 Text Interpretation:  Sinus rhythm Atrial premature complex Incomplete  left bundle branch block Low voltage, precordial leads Consider anterior  infarct Prolonged QT interval Baseline wander in lead(s) III aVF Confirmed  by LITTLE MD, RACHEL ([id number]) on 02/26/2015 2:31:57 PM      MDM   Final diagnoses:  None    69 year old female who presents with vomiting and diarrhea. She was picked up by EMS and noted to be diaphoretic and pale with decreased level of responsiveness. Vital signs stable during transport and blood glucose 107. On arrival,  the patient was 96.4 rectally. No abdominal tenderness on exam. Patient unable to provide history due to her underlying dementia. Placed warming blankets on patient and initiated sepsis protocol given ill appearance and symptoms. Patient given slow IV fluid bolus given her history of CHF as well as broad-spectrum antibiotics with vancomycin and Zosyn. EKG shows prolonged QT. Obtained labs listed above including blood and urine cultures. Chest x-ray negative for acute process. Shortly after arrival, the patient's blood glucose was rechecked and noted to be 40. The patient given an amp of D50 and I ordered every hour CBG testing. Initial lactate elevated at 2.09. Potassium low at 3.1. I have ordered IV magnesium as well as IV potassium repletion. I am concerned that the patient's hypoglycemia and hypothermia may represent sepsis. The patient will be admitted to general medicine for further care.  Laurence Spates, MD 02/26/15 1518  Laurence Spates, MD 02/27/15 [phone]

## 2015-02-26 NOTE — ED Notes (Signed)
1 amp D 50 GIVEN. ADMITTING MD CALLED AND UPDATED ON PT CURRENT STATUS. PT ALERT AND ACTIVE WITH CARE. NEURO STATUS UNCHANGED.

## 2015-02-26 NOTE — ED Notes (Signed)
MD at bedside. Admitting MD PRESENT. AWARE OF CBG 63. NO ORDERS GIVEN

## 2015-02-26 NOTE — Progress Notes (Signed)
Email sent to financial counselor

## 2015-02-26 NOTE — ED Notes (Signed)
PLACED ON [facility] FOR SUPPORT

## 2015-02-26 NOTE — ED Notes (Signed)
Per ems pt from home, co emesis and diarrhea, weakness. Upon ems arrival pt was laying supine on floor, denies fall. Pt was covered of stool and vomited as she set up. Pt is diaphoretic and pale. Pt is dementia pt, no loc change to baseline. Pt is alert yet appears lethargic. VS 60 HR, 126/80 BP, CBG 107 per ems. Pt breathing regular;y with no distress.

## 2015-02-26 NOTE — ED Notes (Signed)
CASE MANAGER KIM PRESENT TO SPEAK TO PT AND FAMILY

## 2015-02-26 NOTE — Telephone Encounter (Signed)
See note below to be advised. 

## 2015-02-27 ENCOUNTER — Inpatient Hospital Stay (HOSPITAL_COMMUNITY)
Admit: 2015-02-27 | Discharge: 2015-02-27 | Disposition: A | Discharge status: Home / Self Care | Payer: Commercial Managed Care - HMO | Attending: Internal Medicine | Admitting: Internal Medicine

## 2015-02-27 ENCOUNTER — Inpatient Hospital Stay (HOSPITAL_COMMUNITY): Payer: Commercial Managed Care - HMO

## 2015-02-27 DIAGNOSIS — E162 Hypoglycemia, unspecified: Secondary | ICD-10-CM

## 2015-02-27 DIAGNOSIS — E1165 Type 2 diabetes mellitus with hyperglycemia: Secondary | ICD-10-CM

## 2015-02-27 DIAGNOSIS — R55 Syncope and collapse: Secondary | ICD-10-CM

## 2015-02-27 DIAGNOSIS — G309 Alzheimer's disease, unspecified: Secondary | ICD-10-CM

## 2015-02-27 LAB — COMPREHENSIVE METABOLIC PANEL
ALT: 27 U/L (ref 14–54)
AST: 22 U/L (ref 15–41)
Albumin: 3.5 g/dL (ref 3.5–5.0)
Alkaline Phosphatase: 61 U/L (ref 38–126)
Anion gap: 11 (ref 5–15)
BUN: 21 mg/dL — AB (ref 6–20)
CALCIUM: 8.8 mg/dL — AB (ref 8.9–10.3)
CO2: 24 mmol/L (ref 22–32)
Chloride: 105 mmol/L (ref 101–111)
Creatinine, Ser: 0.78 mg/dL (ref 0.44–1.00)
GFR calc non Af Amer: >60 mL/min (ref >60)
Glucose, Bld: 197 mg/dL — ABNORMAL HIGH (ref 65–99)
Potassium: 4.3 mmol/L (ref 3.5–5.1)
Sodium: 140 mmol/L (ref 135–145)
TOTAL PROTEIN: 6.6 g/dL (ref 6.5–8.1)
Total Bilirubin: 1 mg/dL (ref 0.3–1.2)

## 2015-02-27 LAB — CBC
HCT: 37.5 % (ref 36.0–46.0)
Hemoglobin: 11.8 g/dL — ABNORMAL LOW (ref 12.0–15.0)
MCH: 26.9 pg (ref 26.0–34.0)
MCHC: 31.5 g/dL (ref 30.0–36.0)
MCV: 85.4 fL (ref 78.0–100.0)
PLATELETS: 194 10*3/uL (ref 150–400)
RBC: 4.39 MIL/uL (ref 3.87–5.11)
RDW: 15.1 % (ref 11.5–15.5)
WBC: 5.8 10*3/uL (ref 4.0–10.5)

## 2015-02-27 LAB — C DIFFICILE QUICK SCREEN W PCR REFLEX
C Diff antigen: NEGATIVE
C Diff interpretation: NEGATIVE
C Diff toxin: NEGATIVE

## 2015-02-27 LAB — PROTIME-INR
INR: 1.03 (ref 0.00–1.49)
Prothrombin Time: 13.7 seconds (ref 11.6–15.2)

## 2015-02-27 LAB — GLUCOSE, CAPILLARY
GLUCOSE-CAPILLARY: 178 mg/dL — AB (ref 65–99)
GLUCOSE-CAPILLARY: 159 mg/dL — AB (ref 65–99)

## 2015-02-27 LAB — TSH: TSH: 1.336 u[IU]/mL (ref 0.350–4.500)

## 2015-02-27 MED ORDER — LISINOPRIL 2.5 MG PO TABS
2.5000 mg | ORAL_TABLET | Freq: Every morning | ORAL | Status: DC
  Administered 2015-02-27 – 2015-02-28 (×2): 2.5 mg via ORAL
  Filled 2015-02-27 (×2): qty 1

## 2015-02-27 MED ORDER — ATORVASTATIN CALCIUM 20 MG PO TABS
20.0000 mg | ORAL_TABLET | Freq: Every day | ORAL | Status: DC
  Administered 2015-02-27 – 2015-02-28 (×2): 20 mg via ORAL
  Filled 2015-02-27 (×2): qty 1

## 2015-02-27 MED ORDER — FUROSEMIDE 40 MG PO TABS
40.0000 mg | ORAL_TABLET | Freq: Every day | ORAL | Status: DC
  Administered 2015-02-27 – 2015-02-28 (×2): 40 mg via ORAL
  Filled 2015-02-27 (×2): qty 1

## 2015-02-27 NOTE — Clinical Social Work Note (Signed)
Clinical Social Work Assessment  Patient Details  Name: Dana [name] MRN: [id number] Date of Birth: May 08, 1946  Date of referral:  02/27/15               Reason for consult:  Facility Placement                Permission sought to share information with:  Chartered certified accountant granted to share information::  Yes, Verbal Permission Granted  Name::        Agency::     Relationship::     Contact Information:     Housing/Transportation Living arrangements for the past 2 months:  Single Family Home Source of Information:  Spouse Patient Interpreter Needed:  None Criminal Activity/Legal Involvement Pertinent to Current Situation/Hospitalization:  No - Comment as needed Significant Relationships:  Spouse Lives with:  Spouse Do you feel safe going back to the place where you live?  No Need for family participation in patient care:  Yes (Comment)  Care giving concerns:  CSW reviewed PT evaluation recommending SNF at discharge.    Social Worker assessment / plan:  CSW met with patient's husband, [name] in hallway to discuss discharge planning.   Employment status:  Retired Nurse, adult PT Recommendations:  [place] / Referral to community resources:  [place]  Patient/Family's Response to care:  Patient's husband is agreeable with plan for SNF - possible need for long term care, depending on how well she progresses with therapy at [facility].   Patient/Family's Understanding of and Emotional Response to Diagnosis, Current Treatment, and Prognosis:  Patient's husband is realistic with patient's outcome, realizes that she may need to stay permanently at [facility] - requested information on how to apply for Medicaid. CSW provided husband with Medicaid Application and instructions on how to apply.   Emotional Assessment Appearance:  Appears stated age Attitude/Demeanor/Rapport:    Affect (typically observed):   Unable to Assess Orientation:  Oriented to Self Alcohol / Substance use:    Psych involvement (Current and /or in the community):     Discharge Needs  Concerns to be addressed:    Readmission within the last 30 days:    Current discharge risk:    Barriers to Discharge:      Standley Brooking, LCSW 02/27/2015, 4:02 PM

## 2015-02-27 NOTE — Progress Notes (Signed)
Offsite EEG completed at [facility]; results pending. 

## 2015-02-27 NOTE — Evaluation (Signed)
Physical Therapy Evaluation Patient Details Name: Dana [name] MRN: [id number] DOB: 1945/09/04 Today's Date: 02/27/2015   History of Present Illness  Pt is a 69 year old female with hx of HTN, CHF, dementia, urinary incontinence, L TKA, DM retinopathy and admitted after a syncopal episode at home and found to have hypoglycemia.  Clinical Impression  Pt admitted with above diagnosis. Pt currently with functional limitations due to the deficits listed below (see PT Problem List).  Pt will benefit from skilled PT to increase their independence and safety with mobility to allow discharge to the venue listed below.   Pt assisted OOB and ambulated short distance in hallway, therapist limited distance due to pt stating dizziness.  No family present and pt with hx of dementia so uncertain of baseline and if spouse will be able to assist at home.  If spouse unable, may need SNF.     Follow Up Recommendations SNF    Equipment Recommendations  None recommended by PT    Recommendations for Other Services       Precautions / Restrictions Precautions Precautions: Fall      Mobility  Bed Mobility Overal bed mobility: Needs Assistance Bed Mobility: Supine to Sit     Supine to sit: Supervision;HOB elevated     General bed mobility comments: increased time, cues due to cognition  Transfers Overall transfer level: Needs assistance Equipment used: 1 person hand held assist Transfers: Sit to/from Stand Sit to Stand: Min assist         General transfer comment: assist to steady with rise and manual cues required to process return to sitting  Ambulation/Gait Ambulation/Gait assistance: Min assist Ambulation Distance (Feet): 50 Feet Assistive device: 1 person hand held assist Gait Pattern/deviations: Step-through pattern;Decreased stride length;Trunk flexed;Drifts right/left     General Gait Details: pt confused trying to find a chicken and then kitchen, assist to steady and guide  ambulation, pt reported dizziness so recliner brought behind pt to return to sitting for safety  Stairs            Wheelchair Mobility    Modified Rankin (Stroke Patients Only)       Balance Overall balance assessment: Needs assistance         Standing balance support: Single extremity supported Standing balance-Leahy Scale: Poor Standing balance comment: requires at least one UE support for steadying                             Pertinent Vitals/Pain Pain Assessment: No/denies pain    Home Living Family/patient expects to be discharged to:: Private residence Living Arrangements: Spouse/significant other Available Help at Discharge: Family Type of Home: House           Additional Comments: pt poor historian due to dementia, lives with spouse per chart review    Prior Function           Comments: poor historian however ambulatory today     Hand Dominance        Extremity/Trunk Assessment               Lower Extremity Assessment: Generalized weakness      Cervical / Trunk Assessment: Kyphotic  Communication   Communication: No difficulties  Cognition Arousal/Alertness: Awake/alert Behavior During Therapy: Restless;Agitated Overall Cognitive Status: No family/caregiver present to determine baseline cognitive functioning (hx dementia, appears pleasantly confused)  General Comments      Exercises        Assessment/Plan    PT Assessment Patient needs continued PT services  PT Diagnosis Difficulty walking   PT Problem List Decreased strength;Decreased activity tolerance;Decreased mobility;Decreased cognition  PT Treatment Interventions DME instruction;Gait training;Functional mobility training;Patient/family education;Therapeutic activities;Therapeutic exercise   PT Goals (Current goals can be found in the Care Plan section) Acute Rehab PT Goals PT Goal Formulation: Patient unable to  participate in goal setting Time For Goal Achievement: 03/13/15 Potential to Achieve Goals: Good    Frequency Min 2X/week   Barriers to discharge        Co-evaluation               End of Session   Activity Tolerance: Patient tolerated treatment well Patient left: in chair;with call bell/phone within reach;with chair alarm set Nurse Communication: Mobility status         Time: 1345-1401 PT Time Calculation (min) (ACUTE ONLY): 16 min   Charges:   PT Evaluation $Initial PT Evaluation Tier I: 1 Procedure     PT G Codes:        Dana [name],Dana [name] 02/27/2015, 3:09 PM Zenovia Jarred, PT, DPT 02/27/2015 Pager: [phone]

## 2015-02-27 NOTE — Progress Notes (Signed)
  Echocardiogram 2D Echocardiogram has been performed.  Tye Savoy 02/27/2015, 3:33 PM

## 2015-02-27 NOTE — Progress Notes (Signed)
Patient hasn't had further episodes of transient alteration of consciousness. EEG with mild slowing but without evidence of epileptiform discharges. Suspect hypoglycemia as the culprit for patient paroxysmal event that prompted this hospital admission. Neurology will sign off.  Wyatt Portela, MD

## 2015-02-27 NOTE — Progress Notes (Signed)
[facility] PROGRESS NOTE  Dana [name] ZOX:[id number] DOB: 09/07/1945 DOA: 02/26/2015  PCP: Romero Belling, MD  Brief HPI: 69 year old African-American female with a history of dementia and insulin-dependent diabetes and cardiomyopathy presented after a syncopal episode at home. There was concern about possible seizure type activity. She was found to be hypoglycemic.  Past medical history:  Past Medical History  Diagnosis Date  . ALLERGIC RHINITIS 02/08/2007  . ANXIETY 02/08/2007  . DEPRESSION 02/08/2007  . DIABETES MELLITUS, TYPE I 02/08/2007  . Headache(784.0) 11/06/2008  . HYPERCHOLESTEROLEMIA 11/05/2008  . HYPERTENSION 02/08/2007  . URINARY INCONTINENCE 02/08/2007  . Dyslipidemia   . Non-ulcer dyspepsia   . Leukopenia   . DM retinopathy     background  . 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 Dementia   . CHF (congestive heart failure)   . Anemia     takes iron  . Skin abnormality     both knees with opne ares and left shoulder with open areas, changes dressings prn    Consultants: Neurology  Procedures:  EEG  Antibiotics: None  Subjective: Patient pleasantly confused. Denies any complaints.  Objective: Vital Signs  Filed Vitals:   02/26/15 1802 02/26/15 1930 02/26/15 2102 02/27/15 0355  BP: 124/99 163/86 135/65 165/91  Pulse:  71 69 65  Temp:  98.1 F (36.7 C) 98.1 F (36.7 C) 99 F (37.2 C)  TempSrc:  Oral Oral Oral  Resp:  Height:   (1.6 m)    Weight:  64 kg (141 lb 1.5 oz)  64.1 kg (141 lb 5 oz)  SpO2:  100% 99% 100%    Intake/Output Summary (Last 24 hours) at 02/27/15 1329 Last data filed at 02/27/15 0840  Gross per 24 hour  Intake   1290 ml  Output    200 ml  Net   1090 ml   Filed Weights   02/26/15 1455 02/26/15 1930 02/27/15 0355  Weight: 63.5 kg (139 lb 15.9 oz) 64 kg (141 lb 1.5 oz) 64.1 kg (141 lb 5 oz)    General appearance: alert, cooperative, appears stated age and no distress Head: Normocephalic,  without obvious abnormality, atraumatic Resp: clear to auscultation bilaterally Cardio: regular rate and rhythm, S1, S2 normal, no murmur, click, rub or gallop GI: soft, non-tender; bowel sounds normal; no masses,  no organomegaly Extremities: extremities normal, atraumatic, no cyanosis or edema Pulses: 2+ and symmetric Neurologic: Alert. Pleasantly confused. No focal deficits.  Lab Results:  Basic Metabolic Panel:  Recent Labs Lab 02/26/15 1409 02/27/15 0538  NA 143 140  K 3.1* 4.3  CL 102 105  CO2 29 24  GLUCOSE 40* 197*  BUN 23* 21*  CREATININE 0.82 0.78  CALCIUM 9.8 8.8*   Liver Function Tests:  Recent Labs Lab 02/26/15 1409 02/27/15 0538  AST 23 22  ALT 28 27  ALKPHOS 66 61  BILITOT 1.1 1.0  PROT 7.4 6.6  ALBUMIN 3.9 3.5    Recent Labs Lab 02/26/15 1409  LIPASE 28   CBC:  Recent Labs Lab 02/26/15 1409 02/27/15 0538  WBC 5.7 5.8  NEUTROABS 2.2  --   HGB 13.0 11.8*  HCT 39.5 37.5  MCV 84.2 85.4  PLT 213 194   CBG:  Recent Labs Lab 02/26/15 1740 02/26/15 1830 02/26/15 2134 02/27/15 0751 02/27/15 1224  GLUCAP 47* 143* 278* 178* 159*    Recent Results (from the past 240 hour(s))  Urine culture  Status: None (Preliminary result)   Collection Time: 02/26/15  4:03 PM  Result Value Ref Range Status   Specimen Description URINE, CATHETERIZED  Final   Special Requests Normal  Final   Culture   Final    NO GROWTH < 24 HOURS Performed at [facility]    Report Status PENDING  Incomplete  MRSA PCR Screening     Status: None   Collection Time: 02/26/15  6:45 PM  Result Value Ref Range Status   MRSA by PCR NEGATIVE NEGATIVE Final    Comment:        The GeneXpert MRSA Assay (FDA approved for NASAL specimens only), is one component of a comprehensive MRSA colonization surveillance program. It is not intended to diagnose MRSA infection nor to guide or monitor treatment for MRSA infections.       Studies/Results: Dg Chest  Port 1 View  02/26/2015   CLINICAL DATA:  69 year old female with weakness, hypothermia, found down, vomiting, diarrhea, diaphoresis. Initial encounter.  EXAM: PORTABLE CHEST - 1 VIEW  COMPARISON:  06/11/2014 and earlier.  FINDINGS: Portable AP semi upright view at 1450 hours. Normal cardiac size and mediastinal contours. Calcified atherosclerosis of the aorta. No pneumothorax or pleural effusion. Allowing for portable technique, the lungs are clear. No acute osseous abnormality identified.  IMPRESSION: No acute cardiopulmonary abnormality.   Electronically Signed   By: Odessa Fleming M.D.   On: 02/26/2015 15:00    Medications:  Scheduled: . carvedilol  3.125 mg Oral BID WC  . citalopram  20 mg Oral Daily  . divalproex  250 mg Oral q morning - 10a  . donepezil  10 mg Oral QHS  . enoxaparin (LOVENOX) injection  40 mg Subcutaneous Q24H  . insulin aspart  0-15 Units Subcutaneous TID WC   Continuous:  [id number]  Assessment/Plan:  Active Problems:   Diabetes mellitus type 2, uncontrolled   Alzheimer's disease   Syncope   Hypoglycemia    Syncope versus seizure Likely triggered by hypoglycemic episode. Appreciate neurology input. EEG does not show any epileptiform activity. No further episodes since she has been in the hospital.  Hypoglycemia in the setting of insulin-dependent diabetes Patient likely is on high doses of insulin. Blood sugars have improved. She is on D5 infusion. This will be stopped first. Continue to monitor blood sugars closely. Depending on how her blood sugars do in the next 24 hours. Further decisions regarding insulin regimen can be made. HbA1c is pending.  History of dementia Stable. Patient is pleasantly confused. Continue Aricept.  History of chronic systolic congestive heart failure Well compensated. Continue carvedilol. Last known EF is 25-30%. Can resume lisinopril and lasix.   DVT Prophylaxis: Lovenox    Code Status: Full code  Family Communication: No  family at bedside  Disposition Plan: Mobilize. Await stabilization of blood glucose levels.  Follow-up Appointment?: With her endocrinologist in 1-2 weeks   LOS: 1 day   [facility]  [facility] Pager [phone] 02/27/2015, 1:29 PM  If 7PM-7AM, please contact night-coverage at [email or web address], password [facility]

## 2015-02-27 NOTE — Procedures (Signed)
EEG report.  Brief clinical history: 69 y/o with advanced dementia, admitted for evaluation of recurrent, transient episodes of acute alteration of consciousness with associated diaphoresis, vomiting, stool incontinence, and body jerkiness. Of note, patient with similar episodes in the past (last episode 1 week ago) in the context of low blood sugars. She is back to baseline.  .  Technique: this is a 17 channel routine scalp EEG performed at the bedside with bipolar and monopolar montages arranged in accordance to the international 10/20 system of electrode placement. One channel was dedicated to EKG recording.  Patient is awake during the study. No activating procedures performed.  Description:In the wakeful state, the best background consisted of medium amplitude, posterior dominant, poorly sustained, symmetric and reactive 6 Hz rhythm. No sleep was achieved.  Intermittent photic stimulation did induce a normal driving response.  No focal or generalized epileptiform discharges noted.  EKG showed sinus rhythm.  Impression: this is an abnormal awake EEG with findings consistent with a mild encephalopathy, no specific but characteristically seen in a wide variety of toxic metabolic encephalopathies as well as degenerative neurological disorders like dementia. Please, be aware that the absence of epileptiform discharges does not exclude the possibility of epilepsy.  Clinical correlation is advised.   Wyatt Portela, MD

## 2015-02-27 NOTE — Care Management Note (Signed)
Case Management Note  Patient Details  Name: Dana [name] MRN: [id number] Date of Birth: March 11, 1946  Subjective/Objective:  69 y/o f admitted w/syncope. From home w/spouse.Received referral for [facility] resources, & medicaid.Spoke to spouse-will await PT cons-recommendations.                  Action/Plan:Monitor progress for d/c needs.   Expected Discharge Date:   (unknown)               Expected Discharge Plan:  Skilled Nursing Facility  In-House Referral:     Discharge planning Services  CM Consult  Post Acute Care Choice:    Choice offered to:     DME Arranged:    DME Agency:     HH Arranged:    HH Agency:     Status of Service:  In process, will continue to follow  Medicare Important Message Given:    Date Medicare IM Given:    Medicare IM give by:    Date Additional Medicare IM Given:    Additional Medicare Important Message give by:     If discussed at Long Length of Stay Meetings, dates discussed:    Additional Comments:  Lanier Clam, RN 02/27/2015, 2:49 PM

## 2015-02-27 NOTE — Clinical Social Work Placement (Signed)
   CLINICAL SOCIAL WORK PLACEMENT  NOTE  Date:  02/27/2015  Patient Details  Name: Dana [name] MRN: [id number] Date of Birth: May 07, 1946  Clinical Social Work is seeking post-discharge placement for this patient at the Skilled  Nursing Facility level of care (*CSW will initial, date and re-position this form in  chart as items are completed):  Yes   Patient/family provided with [facility] Clinical Social Work Department's list of facilities offering this level of care within the geographic area requested by the patient (or if unable, by the patient's family).  Yes   Patient/family informed of their freedom to choose among providers that offer the needed level of care, that participate in Medicare, Medicaid or managed care program needed by the patient, have an available bed and are willing to accept the patient.  Yes   Patient/family informed of [facility]'s ownership interest in [facility] and [facility], as well as of the fact that they are under no obligation to receive care at these facilities.  PASRR submitted to EDS on 02/27/15     PASRR number received on 02/27/15     Existing PASRR number confirmed on       FL2 transmitted to all facilities in geographic area requested by pt/family on 02/27/15     FL2 transmitted to all facilities within larger geographic area on       Patient informed that his/her managed care company has contracts with or will negotiate with certain facilities, including the following:            Patient/family informed of bed offers received.  Patient chooses bed at       Physician recommends and patient chooses bed at      Patient to be transferred to   on  .  Patient to be transferred to facility by       Patient family notified on   of transfer.  Name of family member notified:        PHYSICIAN       Additional Comment:    _______________________________________________ Arlyss Repress, LCSW 02/27/2015, 4:04 PM

## 2015-02-27 NOTE — Progress Notes (Addendum)
Inpatient Diabetes Program Recommendations  AACE/ADA: New Consensus Statement on Inpatient Glycemic Control (2013)  Target Ranges:  Prepandial:   less than 140 mg/dL      Peak postprandial:   less than 180 mg/dL (1-2 hours)      Critically ill patients:  140 - 180 mg/dL    Results for Dana [name], Dana [name] (MRN [id number]) as of 02/27/2015 08:50  Ref. Range 02/26/2015 16:06 02/26/2015 17:40 02/26/2015 18:30 02/26/2015 21:34 02/27/2015 07:51  Glucose-Capillary Latest Ref Range: 65-99 mg/dL 63 (L) 47 (L) 725 (H) 366 (H) 178 (H)    Admit with: Syncope/ Seizures/ Hypoglycemia/ Nausea/ Vomiting/ Diarrhea  History: DM, CHF, Dementia  Home DM Meds: Humalog 50/50 insulin- 35 units QAM  Current DM Orders: Novolog Moderate SSI (0-15 units) TID [facility]     -Patient sees Dr. Romero Belling with [facility] End[facility].  Last visit with Dr. Everardo All was 11/22/14.  No changes made to patient's insulin regimen at her last visit with Dr. Everardo All.  -Note patient admitted with Hypoglycemia.  Per patient's husband's report, patient has been having hypoglycemia events at home.  -Patient likely needs reduction of insulin for home.  Not sure why she is on Humalog 50/50 insulin once daily.  Per Dr. George Hugh notes, patient needs simple once daily regimen, however, if patient truly has Type 1 DM and does not make any insulin, she likely needs insulin coverage for the whole day.  Humalog 50/50 insulin would not provide comprehensive coverage for a 24 hour period.  Perhaps a lower dose of 70/30 insulin split between two doses (breakfast and supper) would be a safer regimen??  -May also want to consider more liberal control of patient's glucose levels for home (allowing for CBGs in the 200 range)??    Will follow Ambrose Finland RN, MSN, CDE Diabetes Coordinator Inpatient Glycemic Control Team Team Pager: [phone] (8a-5p)

## 2015-02-28 DIAGNOSIS — I10 Essential (primary) hypertension: Secondary | ICD-10-CM | POA: Diagnosis not present

## 2015-02-28 DIAGNOSIS — I5020 Unspecified systolic (congestive) heart failure: Secondary | ICD-10-CM | POA: Diagnosis not present

## 2015-02-28 DIAGNOSIS — G309 Alzheimer's disease, unspecified: Secondary | ICD-10-CM | POA: Diagnosis not present

## 2015-02-28 DIAGNOSIS — G4089 Other seizures: Secondary | ICD-10-CM | POA: Diagnosis not present

## 2015-02-28 DIAGNOSIS — F339 Major depressive disorder, recurrent, unspecified: Secondary | ICD-10-CM | POA: Diagnosis not present

## 2015-02-28 DIAGNOSIS — R0989 Other specified symptoms and signs involving the circulatory and respiratory systems: Secondary | ICD-10-CM | POA: Diagnosis not present

## 2015-02-28 DIAGNOSIS — E162 Hypoglycemia, unspecified: Secondary | ICD-10-CM | POA: Diagnosis not present

## 2015-02-28 DIAGNOSIS — M6281 Muscle weakness (generalized): Secondary | ICD-10-CM | POA: Diagnosis not present

## 2015-02-28 DIAGNOSIS — E109 Type 1 diabetes mellitus without complications: Secondary | ICD-10-CM | POA: Diagnosis not present

## 2015-02-28 DIAGNOSIS — R262 Difficulty in walking, not elsewhere classified: Secondary | ICD-10-CM | POA: Diagnosis not present

## 2015-02-28 DIAGNOSIS — R05 Cough: Secondary | ICD-10-CM | POA: Diagnosis not present

## 2015-02-28 DIAGNOSIS — E119 Type 2 diabetes mellitus without complications: Secondary | ICD-10-CM | POA: Diagnosis not present

## 2015-02-28 DIAGNOSIS — W19XXXA Unspecified fall, initial encounter: Secondary | ICD-10-CM | POA: Diagnosis not present

## 2015-02-28 DIAGNOSIS — G3184 Mild cognitive impairment, so stated: Secondary | ICD-10-CM | POA: Diagnosis not present

## 2015-02-28 DIAGNOSIS — F39 Unspecified mood [affective] disorder: Secondary | ICD-10-CM | POA: Diagnosis not present

## 2015-02-28 DIAGNOSIS — R42 Dizziness and giddiness: Secondary | ICD-10-CM | POA: Diagnosis not present

## 2015-02-28 DIAGNOSIS — R55 Syncope and collapse: Secondary | ICD-10-CM | POA: Diagnosis not present

## 2015-02-28 DIAGNOSIS — R5383 Other fatigue: Secondary | ICD-10-CM | POA: Diagnosis not present

## 2015-02-28 DIAGNOSIS — E1165 Type 2 diabetes mellitus with hyperglycemia: Secondary | ICD-10-CM | POA: Diagnosis not present

## 2015-02-28 DIAGNOSIS — R5381 Other malaise: Secondary | ICD-10-CM | POA: Diagnosis not present

## 2015-02-28 DIAGNOSIS — F329 Major depressive disorder, single episode, unspecified: Secondary | ICD-10-CM | POA: Diagnosis not present

## 2015-02-28 DIAGNOSIS — E1169 Type 2 diabetes mellitus with other specified complication: Secondary | ICD-10-CM | POA: Diagnosis not present

## 2015-02-28 DIAGNOSIS — R569 Unspecified convulsions: Secondary | ICD-10-CM | POA: Diagnosis not present

## 2015-02-28 DIAGNOSIS — F0390 Unspecified dementia without behavioral disturbance: Secondary | ICD-10-CM | POA: Diagnosis not present

## 2015-02-28 LAB — GLUCOSE, CAPILLARY
GLUCOSE-CAPILLARY: 160 mg/dL — AB (ref 65–99)
GLUCOSE-CAPILLARY: 196 mg/dL — AB (ref 65–99)
Glucose-Capillary: 149 mg/dL — ABNORMAL HIGH (ref 65–99)
Glucose-Capillary: 213 mg/dL — ABNORMAL HIGH (ref 65–99)
Glucose-Capillary: 222 mg/dL — ABNORMAL HIGH (ref 65–99)

## 2015-02-28 LAB — URINE CULTURE
Culture: NO GROWTH
SPECIAL REQUESTS: NORMAL

## 2015-02-28 LAB — HEMOGLOBIN A1C
Hgb A1c MFr Bld: 7.4 % — ABNORMAL HIGH (ref 4.8–5.6)
MEAN PLASMA GLUCOSE: 166 mg/dL

## 2015-02-28 MED ORDER — INSULIN GLARGINE 100 UNIT/ML ~~LOC~~ SOLN: 5.0000 [IU] | Freq: Every day | SUBCUTANEOUS | Status: DC

## 2015-02-28 MED ORDER — INSULIN ASPART 100 UNIT/ML ~~LOC~~ SOLN: 0.0000 [IU] | Freq: Three times a day (TID) | SUBCUTANEOUS | Status: DC

## 2015-02-28 MED ORDER — INSULIN GLARGINE 100 UNIT/ML ~~LOC~~ SOLN
5.0000 [IU] | Freq: Every day | SUBCUTANEOUS | Status: DC
  Administered 2015-02-28: 5 [IU] via SUBCUTANEOUS
  Filled 2015-02-28: qty 0.05

## 2015-02-28 NOTE — Progress Notes (Signed)
Inpatient Diabetes Program Recommendations  AACE/ADA: New Consensus Statement on Inpatient Glycemic Control (2013)  Target Ranges:  Prepandial:   less than 140 mg/dL      Peak postprandial:   less than 180 mg/dL (1-2 hours)      Critically ill patients:  140 - 180 mg/dL    Results for Dana [name], Dana [name] (MRN [id number]) as of 02/28/2015 09:06  Ref. Range 02/27/2015 07:51 02/27/2015 12:24 02/27/2015 16:34 02/27/2015 22:39  Glucose-Capillary Latest Ref Range: 65-99 mg/dL 478 (H) 295 (H) 621 (H) 160 (H)    Results for Dana [name], Dana [name] (MRN [id number]) as of 02/28/2015 09:06  Ref. Range 02/28/2015 07:46  Glucose-Capillary Latest Ref Range: 65-99 mg/dL 962 (H)    Results for Dana [name], Dana [name] (MRN [id number]) as of 02/28/2015 09:06  Ref. Range 02/26/2015 21:42  Hemoglobin A1C Latest Ref Range: 4.8-5.6 % 7.4 (H)     Home DM Meds: Humalog 50/50 insulin- 35 units QAM  Current DM Orders: Novolog Moderate SSI (0-15 units) TID [facility]     -Patient sees Dr. Romero Belling with [facility]. Last visit with Dr. Everardo All was 11/22/14. No changes made to patient's insulin regimen at her last visit with Dr. Everardo All.  -Note patient admitted with Hypoglycemia. Per patient's husband's report, patient has been having hypoglycemia events at home.  -Patient likely needs reduction of insulin for home. Not sure why she is on Humalog 50/50 insulin once daily. Per Dr. George Hugh notes, patient needs simple once daily regimen, however, if patient truly has Type 1 DM and does not make any insulin, she likely needs insulin coverage for the whole day. Humalog 50/50 insulin would not provide comprehensive coverage for a 24 hour period. Perhaps a lower dose of 70/30 insulin split between two doses (breakfast and supper) would be a safer regimen??  -May also want to consider more liberal control of patient's glucose levels for home (allowing for CBGs in the 200 range)??    MD- Note patient's fasting glucose elevated to  213 mg/dl this AM.  Patient may need a small dose of basal insulin started.  She may fare better on low dose Lantus along with Novolog.    Note patient will be sent to SNF at time of discharge and will be able to receive more frequent shots of insulin a day by the nursing staff at the SNF.  Please consider starting low dose Lantus- Lantus 8 units daily (0.1 units/kg dosing) to start  Please also consider sending patient to the SNF with Lantus and Novolog SSI instead of Humalog 50/50 insulin      Will follow Ambrose Finland RN, MSN, CDE Diabetes Coordinator Inpatient Glycemic Control Team Team Pager: [phone] (8a-5p)

## 2015-02-28 NOTE — Discharge Instructions (Signed)
Hypoglycemia °Hypoglycemia occurs when the glucose in your blood is too low. Glucose is a type of sugar that is your body's main energy source. Hormones, such as insulin and glucagon, control the level of glucose in the blood. Insulin lowers blood glucose and glucagon increases blood glucose. Having too much insulin in your blood stream, or not eating enough food containing sugar, can result in hypoglycemia. Hypoglycemia can happen to people with or without diabetes. It can develop quickly and can be a medical emergency.  °CAUSES  °· Missing or delaying meals. °· Not eating enough carbohydrates at meals. °· Taking too much diabetes medicine. °· Not timing your oral diabetes medicine or insulin doses with meals, snacks, and exercise. °· Nausea and vomiting. °· Certain medicines. °· Severe illnesses, such as hepatitis, kidney disorders, and certain eating disorders. °· Increased activity or exercise without eating something extra or adjusting medicines. °· Drinking too much alcohol. °· A nerve disorder that affects body functions like your heart rate, blood pressure, and digestion (autonomic neuropathy). °· A condition where the stomach muscles do not function properly (gastroparesis). Therefore, medicines and food may not absorb properly. °· Rarely, a tumor of the pancreas can produce too much insulin. °SYMPTOMS  °· Hunger. °· Sweating (diaphoresis). °· Change in body temperature. °· Shakiness. °· Headache. °· Anxiety. °· Lightheadedness. °· Irritability. °· Difficulty concentrating. °· Dry mouth. °· Tingling or numbness in the hands or feet. °· Restless sleep or sleep disturbances. °· Altered speech and coordination. °· Change in mental status. °· Seizures or prolonged convulsions. °· Combativeness. °· Drowsiness (lethargic). °· Weakness. °· Increased heart rate or palpitations. °· Confusion. °· Pale, gray skin color. °· Blurred or double vision. °· Fainting. °DIAGNOSIS  °A physical exam and medical history will be  performed. Your caregiver may make a diagnosis based on your symptoms. Blood tests and other lab tests may be performed to confirm a diagnosis. Once the diagnosis is made, your caregiver will see if your signs and symptoms go away once your blood glucose is raised.  °TREATMENT  °Usually, you can easily treat your hypoglycemia when you notice symptoms. °· Check your blood glucose. If it is less than 70 mg/dl, take one of the following:   °¨ 3-4 glucose tablets.   °¨ ½ cup juice.   °¨ ½ cup regular soda.   °¨ 1 cup skim milk.   °¨ ½-1 tube of glucose gel.   °¨ 5-6 hard candies.   °· Avoid high-fat drinks or food that may delay a rise in blood glucose levels. °· Do not take more than the recommended amount of sugary foods, drinks, gel, or tablets. Doing so will cause your blood glucose to go too high.   °· Wait 10-15 minutes and recheck your blood glucose. If it is still less than 70 mg/dl or below your target range, repeat treatment.   °· Eat a snack if it is more than 1 hour until your next meal.   °There may be a time when your blood glucose may go so low that you are unable to treat yourself at home when you start to notice symptoms. You may need someone to help you. You may even faint or be unable to swallow. If you cannot treat yourself, someone will need to bring you to the hospital.  °HOME CARE INSTRUCTIONS °· If you have diabetes, follow your diabetes management plan by: °¨ Taking your medicines as directed. °¨ Following your exercise plan. °¨ Following your meal plan. Do not skip meals. Eat on time. °¨ Testing your blood   glucose regularly. Check your blood glucose before and after exercise. If you exercise longer or different than usual, be sure to check blood glucose more frequently. °¨ Wearing your medical alert jewelry that says you have diabetes. °· Identify the cause of your hypoglycemia. Then, develop ways to prevent the recurrence of hypoglycemia. °· Do not take a hot bath or shower right after an  insulin shot. °· Always carry treatment with you. Glucose tablets are the easiest to carry. °· If you are going to drink alcohol, drink it only with meals. °· Tell friends or family members ways to keep you safe during a seizure. This may include removing hard or sharp objects from the area or turning you on your side. °· Maintain a healthy weight. °SEEK MEDICAL CARE IF:  °· You are having problems keeping your blood glucose in your target range. °· You are having frequent episodes of hypoglycemia. °· You feel you might be having side effects from your medicines. °· You are not sure why your blood glucose is dropping so low. °· You notice a change in vision or a new problem with your vision. °SEEK IMMEDIATE MEDICAL CARE IF:  °· Confusion develops. °· A change in mental status occurs. °· The inability to swallow develops. °· Fainting occurs. °Document Released: 07/06/2005 Document Revised: 07/11/2013 Document Reviewed: 11/02/2011 °ExitCare® Patient Information ©2015 [employer]. This information is not intended to replace advice given to you by your health care provider. Make sure you discuss any questions you have with your health care provider. ° °

## 2015-02-28 NOTE — Care Management Note (Signed)
Case Management Note  Patient Details  Name: Dana [name] MRN: [id number] Date of Birth: 1946-04-01  Subjective/Objective:                    Action/Plan:d/c SNF   Expected Discharge Date:   (unknown)               Expected Discharge Plan:  Skilled Nursing Facility  In-House Referral:     Discharge planning Services  CM Consult  Post Acute Care Choice:    Choice offered to:     DME Arranged:    DME Agency:     HH Arranged:    HH Agency:     Status of Service:  Completed, signed off  Medicare Important Message Given:    Date Medicare IM Given:    Medicare IM give by:    Date Additional Medicare IM Given:    Additional Medicare Important Message give by:     If discussed at Long Length of Stay Meetings, dates discussed:    Additional Comments:  Lanier Clam, RN 02/28/2015, 11:17 AM

## 2015-02-28 NOTE — Care Management Important Message (Signed)
Important Message  Patient Details  Name: Dana [name] MRN: [id number] Date of Birth: 1945-08-28   Medicare Important Message Given:  [facility] notification given    Haskell Flirt 02/28/2015, 12:43 PMImportant Message  Patient Details  Name: Dana [name] MRN: [id number] Date of Birth: 1945-08-09   Medicare Important Message Given:  Yes-second notification given    Haskell Flirt 02/28/2015, 12:42 PM

## 2015-02-28 NOTE — Clinical Documentation Improvement (Signed)
Please identify any clinical conditions associated with the abnormal potassium value below and document in your future progress notes and discharge summary.  Potassium was 3.1 on 02/26/15 and potassium chloride 10 mEq IV x 2 ordered same day.  Possible Clinical Conditions: -Hypokalemia -Other condition (please specify) -Unable to determine at present  Component      Potassium  Latest Ref Rng      3.5 - 5.1 mmol/L  02/26/2015     2:09 PM 3.1 (L)  02/27/2015     5:38 AM 4.3   Thank you, Doy Mince, RN [phone] Clinical Documentation Specialist

## 2015-02-28 NOTE — Clinical Social Work Placement (Signed)
Patient is set to discharge to [facility] today. Patient & husband, [name] aware. [facility] authorization obtained (#: [id number]). Discharge packet given to RN, Skeet Simmer. [facility] called for transport.     Lincoln Maxin, LCSW [facility] Clinical Social Worker cell #: [phone]    CLINICAL SOCIAL WORK PLACEMENT  NOTE  Date:  02/28/2015  Patient Details  Name: Dana [name] MRN: [id number] Date of Birth: Jun 03, 1946  Clinical Social Work is seeking post-discharge placement for this patient at the Skilled  Nursing Facility level of care (*CSW will initial, date and re-position this form in  chart as items are completed):  Yes   Patient/family provided with [facility] Clinical Social Work Department's list of facilities offering this level of care within the geographic area requested by the patient (or if unable, by the patient's family).  Yes   Patient/family informed of their freedom to choose among providers that offer the needed level of care, that participate in Medicare, Medicaid or managed care program needed by the patient, have an available bed and are willing to accept the patient.  Yes   Patient/family informed of [facility]'s ownership interest in [facility] and [facility], as well as of the fact that they are under no obligation to receive care at these facilities.  PASRR submitted to EDS on 02/27/15     PASRR number received on 02/27/15     Existing PASRR number confirmed on       FL2 transmitted to all facilities in geographic area requested by pt/family on 02/27/15     FL2 transmitted to all facilities within larger geographic area on       Patient informed that his/her managed care company has contracts with or will negotiate with certain facilities, including the following:        Yes   Patient/family informed of bed offers received.  Patient chooses bed at [facility]     Physician recommends and patient  chooses bed at      Patient to be transferred to [facility] on 02/28/15.  Patient to be transferred to facility by [facility]     Patient family notified on 02/28/15 of transfer.  Name of family member notified:  patient's husband, [name] via phone     PHYSICIAN       Additional Comment:    _______________________________________________ Arlyss Repress, LCSW 02/28/2015, 2:08 PM

## 2015-02-28 NOTE — Progress Notes (Signed)
Report called to RN, Synetta Fail, at [facility]. Pt. Discharged to SNF.

## 2015-02-28 NOTE — Discharge Summary (Addendum)
[facility]  Physician Discharge Summary   Patient ID: Dana [name] MRN: [id number] DOB/AGE: 12/26/1945 69 y.o.  Admit date: 02/26/2015 Discharge date: 02/28/2015  PCP: Romero Belling, MD  DISCHARGE DIAGNOSES:  Active Problems:   Diabetes mellitus type 2, uncontrolled   Alzheimer's disease   Syncope   Hypoglycemia   RECOMMENDATIONS FOR OUTPATIENT FOLLOW UP: 1. Please check basic metabolic panel next week 2. Monitor CBGs at least 3 times a day. Adjust Lantus dose as indicated 3. Patient being discharged to SNF   DISCHARGE CONDITION: fair  Diet recommendation: Modified carbohydrate  Filed Weights   02/26/15 1930 02/27/15 0355 02/28/15 0600  Weight: 64 kg (141 lb 1.5 oz) 64.1 kg (141 lb 5 oz) 76.159 kg (167 lb 14.4 oz)    INITIAL HISTORY: 69 year old African-American female with a history of dementia and insulin-dependent diabetes and cardiomyopathy presented after a syncopal episode at home. There was concern about possible seizure type activity. She was found to be hypoglycemic.  Consultations:  Neurology  Procedures: EEG  HOSPITAL COURSE:   Syncope versus seizure Patient's symptoms were likely triggered by hypoglycemia. Patient was seen by neurology. EEG was done which did not show any epileptiform activity. Did show mild slowing. No further neurological workup is necessary. She hasn't had any further episodes of altered mentation in the hospital. Her insulin regimen has been modified.  Hypoglycemia in the setting of insulin-dependent diabetes Patient likely is on high doses of insulin than what she needs. She was initially placed on a D5 infusion. Insulin was discontinued. Blood sugars improved. D5 was subsequently discontinued. Blood sugars in the hyperglycemic range. We will initiate low-dose Lantus. Her 50/50 insulin has been discontinued. HbA1c 7.4. Patient was hypokalemic initially. This was corrected.  History of dementia Stable. Patient is  pleasantly confused. Continue Aricept and Depakote. TSH is normal.  History of chronic systolic congestive heart failure Well compensated. Continue carvedilol, lisinopril and Lasix. Last known EF is 25-30%. Check basic metabolic panel next week.  Overall, patient is stable. Seen by physical and occupational therapy. SNF was recommended. Discussed with patient's husband yesterday. He is agreeable to this plan. She is stable to be discharged to SNF today.   PERTINENT LABS:  The results of significant diagnostics from this hospitalization (including imaging, microbiology, ancillary and laboratory) are listed below for reference.    Microbiology: Recent Results (from the past 240 hour(s))  Blood Culture (routine x 2)     Status: None (Preliminary result)   Collection Time: 02/26/15  3:44 PM  Result Value Ref Range Status   Specimen Description BLOOD RIGHT ANTECUBITAL  Final   Special Requests BOTTLES DRAWN AEROBIC AND ANAEROBIC 5 CC EA  Final   Culture   Final    NO GROWTH 2 DAYS Performed at [facility]    Report Status PENDING  Incomplete  Blood Culture (routine x 2)     Status: None (Preliminary result)   Collection Time: 02/26/15  3:47 PM  Result Value Ref Range Status   Specimen Description BLOOD LEFT HAND  Final   Special Requests BOTTLES DRAWN AEROBIC AND ANAEROBIC 3 CC EA  Final   Culture   Final    NO GROWTH 2 DAYS Performed at [facility]    Report Status PENDING  Incomplete  Urine culture     Status: None   Collection Time: 02/26/15  4:03 PM  Result Value Ref Range Status   Specimen Description URINE, CATHETERIZED  Final   Special Requests Normal  Final   Culture   Final    NO GROWTH 2 DAYS Performed at [facility]    Report Status 02/28/2015 FINAL  Final  MRSA PCR Screening     Status: None   Collection Time: 02/26/15  6:45 PM  Result Value Ref Range Status   MRSA by PCR NEGATIVE NEGATIVE Final    Comment:        The GeneXpert MRSA  Assay (FDA approved for NASAL specimens only), is one component of a comprehensive MRSA colonization surveillance program. It is not intended to diagnose MRSA infection nor to guide or monitor treatment for MRSA infections.   C difficile quick scan w PCR reflex     Status: None   Collection Time: 02/27/15  7:20 PM  Result Value Ref Range Status   C Diff antigen NEGATIVE NEGATIVE Final   C Diff toxin NEGATIVE NEGATIVE Final   C Diff interpretation Negative for toxigenic C. difficile  Final     Labs: Basic Metabolic Panel:  Recent Labs Lab 02/26/15 1409 02/27/15 0538  NA 143 140  K 3.1* 4.3  CL 102 105  CO2 29 24  GLUCOSE 40* 197*  BUN 23* 21*  CREATININE 0.82 0.78  CALCIUM 9.8 8.8*   Liver Function Tests:  Recent Labs Lab 02/26/15 1409 02/27/15 0538  AST 23 22  ALT 28 27  ALKPHOS 66 61  BILITOT 1.1 1.0  PROT 7.4 6.6  ALBUMIN 3.9 3.5    Recent Labs Lab 02/26/15 1409  LIPASE 28   CBC:  Recent Labs Lab 02/26/15 1409 02/27/15 0538  WBC 5.7 5.8  NEUTROABS 2.2  --   HGB 13.0 11.8*  HCT 39.5 37.5  MCV 84.2 85.4  PLT 213 194   CBG:  Recent Labs Lab 02/27/15 0751 02/27/15 1224 02/27/15 1634 02/27/15 2239 02/28/15 0746  GLUCAP 178* 159* 149* 160* 213*     IMAGING STUDIES Dg Chest Port 1 View  02/26/2015   CLINICAL DATA:  69 year old female with weakness, hypothermia, found down, vomiting, diarrhea, diaphoresis. Initial encounter.  EXAM: PORTABLE CHEST - 1 VIEW  COMPARISON:  06/11/2014 and earlier.  FINDINGS: Portable AP semi upright view at 1450 hours. Normal cardiac size and mediastinal contours. Calcified atherosclerosis of the aorta. No pneumothorax or pleural effusion. Allowing for portable technique, the lungs are clear. No acute osseous abnormality identified.  IMPRESSION: No acute cardiopulmonary abnormality.   Electronically Signed   By: Odessa Fleming M.D.   On: 02/26/2015 15:00    DISCHARGE EXAMINATION: Filed Vitals:   02/27/15 1603  02/27/15 2210 02/28/15 0558 02/28/15 0600  BP: 176/85 154/68 128/58   Pulse: 66 67 66   Temp: 98.1 F (36.7 C) 98.3 F (36.8 C) 99.1 F (37.3 C)   TempSrc: Oral Oral Oral   Resp: 18 18 18    Height:      Weight:    76.159 kg (167 lb 14.4 oz)  SpO2: 100% 100% 99%    General appearance: alert, cooperative and no distress Resp: clear to auscultation bilaterally Cardio: regular rate and rhythm, S1, S2 normal, no murmur, click, rub or gallop GI: soft, non-tender; bowel sounds normal; no masses,  no organomegaly Neurologic: Pleasantly confused. No focal deficits.  DISPOSITION: SNF  Discharge Instructions    Call MD for:  difficulty breathing, headache or visual disturbances    Complete by:  As directed      Call MD for:  extreme fatigue    Complete by:  As directed  Call MD for:  persistant dizziness or light-headedness    Complete by:  As directed      Call MD for:  persistant nausea and vomiting    Complete by:  As directed      Call MD for:  severe uncontrolled pain    Complete by:  As directed      Call MD for:  temperature >100.4    Complete by:  As directed      Diet Carb Modified    Complete by:  As directed      Discharge instructions    Complete by:  As directed   Please check CBG's tree times a day and at bedtime. Adjust Lantus dose as per CBG's. Check BMET next week to check renal function.     Increase activity slowly    Complete by:  As directed            ALLERGIES:  Allergies  Allergen Reactions  . Aspirin Nausea Only  . Codeine Nausea Only      Current Discharge Medication List    START taking these medications   Details  insulin aspart (NOVOLOG) 100 UNIT/ML injection Inject 0-15 Units into the skin 3 (three) times daily with meals. CBG 70 - 120: 0 units CBG 121 - 150: 2 units CBG 151 - 200: 3 units CBG 201 - 250: 5 units CBG 251 - 300: 8 units Qty: 10 mL, Refills: 11    insulin glargine (LANTUS) 100 UNIT/ML injection Inject 0.05 mLs (5  Units total) into the skin daily. Qty: 10 mL, Refills: 11      CONTINUE these medications which have NOT CHANGED   Details  aspirin EC 325 MG tablet TAKE 1 TABLET (325 MG TOTAL) BY MOUTH DAILY. Qty: 30 tablet, Refills: 1    atorvastatin (LIPITOR) 20 MG tablet TAKE 1 TABLET DAILY. Qty: 90 tablet, Refills: 0    carvedilol (COREG) 3.125 MG tablet Take 1 tablet (3.125 mg total) by mouth 2 (two) times daily with a meal. Qty: 60 tablet, Refills: 6    citalopram (CELEXA) 20 MG tablet TAKE 1 TABLET (20 MG TOTAL) BY MOUTH DAILY. Qty: 30 tablet, Refills: 3    divalproex (DEPAKOTE ER) 250 MG 24 hr tablet Take 250 mg by mouth every morning.     donepezil (ARICEPT) 10 MG tablet Take 10 mg by mouth at bedtime.    furosemide (LASIX) 40 MG tablet Take 40 mg by mouth daily.     lisinopril (PRINIVIL,ZESTRIL) 2.5 MG tablet Take 1 tablet (2.5 mg total) by mouth every morning. Qty: 30 tablet, Refills: 6    memantine (NAMENDA TITRATION PACK) tablet pack Take 7-28 mg by mouth at bedtime. Take 1 tablet (7 mg) for Days 1-7, Take 1 tablet (14 mg) for Days 8-14, Take 1 tablet (21 mg) for Days 15-21, Take 1 tablet (28 mg) Days 22-28.    hyoscyamine (LEVSIN/SL) 0.125 MG SL tablet Place 1 tablet under the tongue twice daily as needed for cramps and spasms. Qty: 60 tablet, Refills: 0    Propylene Glycol (SYSTANE BALANCE OP) Apply 1 drop to eye 3 (three) times daily as needed (dry eyes).    sodium chloride (OCEAN) 0.65 % SOLN nasal spray Place 2 sprays into both nostrils as needed for congestion.      STOP taking these medications     Insulin Lispro Prot & Lispro (HUMALOG MIX 50/50 KWIKPEN) (50-50) 100 UNIT/ML Kwikpen        Follow-up Information  Follow up with Romero Belling, MD. Schedule an appointment as soon as possible for a visit in 1 week.   Specialty:  Endocrinology   Why:  post hospitalization follow up   Contact information:   [address] [place]  [address] [phone]       TOTAL DISCHARGE TIME: 35 minutes  [facility]  [facility] Pager [phone]  02/28/2015, 12:06 PM

## 2015-03-03 ENCOUNTER — Other Ambulatory Visit: Payer: Self-pay | Admitting: Cardiovascular Disease

## 2015-03-03 LAB — CULTURE, BLOOD (ROUTINE X 2)
CULTURE: NO GROWTH
Culture: NO GROWTH

## 2015-03-06 DIAGNOSIS — R5381 Other malaise: Secondary | ICD-10-CM | POA: Diagnosis not present

## 2015-03-06 DIAGNOSIS — G3184 Mild cognitive impairment, so stated: Secondary | ICD-10-CM | POA: Diagnosis not present

## 2015-03-06 DIAGNOSIS — M6281 Muscle weakness (generalized): Secondary | ICD-10-CM | POA: Diagnosis not present

## 2015-03-06 DIAGNOSIS — R262 Difficulty in walking, not elsewhere classified: Secondary | ICD-10-CM | POA: Diagnosis not present

## 2015-03-08 DIAGNOSIS — M6281 Muscle weakness (generalized): Secondary | ICD-10-CM | POA: Diagnosis not present

## 2015-03-08 DIAGNOSIS — R42 Dizziness and giddiness: Secondary | ICD-10-CM | POA: Diagnosis not present

## 2015-03-08 DIAGNOSIS — W19XXXA Unspecified fall, initial encounter: Secondary | ICD-10-CM | POA: Diagnosis not present

## 2015-03-09 ENCOUNTER — Other Ambulatory Visit: Payer: Self-pay | Admitting: Cardiovascular Disease

## 2015-03-09 ENCOUNTER — Other Ambulatory Visit: Payer: Self-pay | Admitting: Endocrinology

## 2015-03-11 DIAGNOSIS — F39 Unspecified mood [affective] disorder: Secondary | ICD-10-CM | POA: Diagnosis not present

## 2015-03-11 DIAGNOSIS — E119 Type 2 diabetes mellitus without complications: Secondary | ICD-10-CM | POA: Diagnosis not present

## 2015-03-11 DIAGNOSIS — F329 Major depressive disorder, single episode, unspecified: Secondary | ICD-10-CM | POA: Diagnosis not present

## 2015-03-11 DIAGNOSIS — G309 Alzheimer's disease, unspecified: Secondary | ICD-10-CM | POA: Diagnosis not present

## 2015-03-11 NOTE — Telephone Encounter (Signed)
Please advise if ok to refill. Medication is listed under a historical provider.  Thanks!  

## 2015-03-12 DIAGNOSIS — G3184 Mild cognitive impairment, so stated: Secondary | ICD-10-CM | POA: Diagnosis not present

## 2015-03-12 DIAGNOSIS — R262 Difficulty in walking, not elsewhere classified: Secondary | ICD-10-CM | POA: Diagnosis not present

## 2015-03-12 DIAGNOSIS — M6281 Muscle weakness (generalized): Secondary | ICD-10-CM | POA: Diagnosis not present

## 2015-03-12 DIAGNOSIS — R5381 Other malaise: Secondary | ICD-10-CM | POA: Diagnosis not present

## 2015-03-14 ENCOUNTER — Ambulatory Visit (INDEPENDENT_AMBULATORY_CARE_PROVIDER_SITE_OTHER): Payer: Commercial Managed Care - HMO | Admitting: Neurology

## 2015-03-14 ENCOUNTER — Encounter: Payer: Self-pay | Admitting: Neurology

## 2015-03-14 VITALS — BP 130/74 | HR 79 | Resp 16 | Wt 172.0 lb

## 2015-03-14 DIAGNOSIS — R569 Unspecified convulsions: Secondary | ICD-10-CM

## 2015-03-14 DIAGNOSIS — F0390 Unspecified dementia without behavioral disturbance: Secondary | ICD-10-CM

## 2015-03-14 DIAGNOSIS — E1169 Type 2 diabetes mellitus with other specified complication: Secondary | ICD-10-CM | POA: Diagnosis not present

## 2015-03-14 DIAGNOSIS — F03C0 Unspecified dementia, severe, without behavioral disturbance, psychotic disturbance, mood disturbance, and anxiety: Secondary | ICD-10-CM

## 2015-03-14 NOTE — Progress Notes (Signed)
NEUROLOGY CONSULTATION NOTE  Dana [name] MRN: [id number] DOB: 1946-06-23  Referring provider: Dr. Romero Belling Primary care provider: Dr. Romero Belling  Reason for consult:  seizures  Dear Dr Everardo All:  Thank you for your kind referral of Dana [name] for consultation of Dana above symptoms. Although her history is well known to you, please allow me to reiterate it for Dana purpose of our medical record. Dana [name] was accompanied to Dana clinic by her husband who also provides collateral information. Records and images were personally reviewed where available.  HISTORY OF PRESENT ILLNESS: This is a 69 year old right-handed woman with a history of dementia, diabetes, CHF, hypertension, hyperlipidemia, presenting for evaluation of possible seizures. [name] has no recollection of events, she reports feeling okay today. Her husband brought her to [facility] ER last 02/26/15 when after an episode of loss of consciousness while on Dana toilet. She told her husband she was not feeling well, vomited, then passed out for 3-4 minutes. She was noted to be sweaty and pale, shaky with foam around her mouth and eye rolling. She was making gurgling sounds as her body was jerking. She was more confused when she woke up. EMS found her covered in vomit and feces, blood sugar by EMS was 107. In Dana ER, her blood sugar was in Dana 40s, going up to mid-60s after D50. There was lactic acidosis, she was started on antibiotics and insulin regimen was adjusted. Her husband reported 4-5 similar milder episodes over Dana past 5 months, associated with low blood sugar, at one point it was in Dana low 30s. She had an EEG which showed mild diffuse slowing. There were no further episodes of altered mental status during her hospital stay, and she was discharged to rehab.  Her husband of 51 years states that memory changes started 5-10 years ago, she has been on Aricept and Namenda was recently added. She is also on low dose Depakote for  agitation and worsening confusion. She has some baseline confusion, but her husband feels this is getting worse. He denies any other episodes of myoclonic jerks, no staring/unresponsive episodes. She denies any headaches, dizziness, diplopia, dysarthria, dysphagia, neck/back pain, focal numbness/tingling/weakness, olfactory/gustatory hallucinations. She usually holds on to her husband to ambulate and denies any falls. Her husband states that since hospital discharge, she has been more scared to walk, thinking she will fall. There is no prior history of seizures. She had a normal birth and early development.  There is no history of febrile convulsions, CNS infections such as meningitis/encephalitis, significant traumatic brain injury, neurosurgical procedures, or family history of seizures.  PAST MEDICAL HISTORY: Past Medical History  Diagnosis Date  . ALLERGIC RHINITIS 02/08/2007  . ANXIETY 02/08/2007  . DEPRESSION 02/08/2007  . DIABETES MELLITUS, TYPE I 02/08/2007  . Headache(784.0) 11/06/2008  . HYPERCHOLESTEROLEMIA 11/05/2008  . HYPERTENSION 02/08/2007  . URINARY INCONTINENCE 02/08/2007  . Dyslipidemia   . Non-ulcer dyspepsia   . Leukopenia   . DM retinopathy     background  . 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 Dementia   . CHF (congestive heart failure)   . Anemia     takes iron  . Skin abnormality     both knees with opne ares and left shoulder with open areas, changes dressings prn    PAST SURGICAL HISTORY: Past Surgical History  Procedure Laterality Date  . Total knee arthroplasty  1990    left  . Panendoscopy  10/27/1999  . Stress cardiolite  12/14/2003  . Electrocardiogram  05/17/2006  . Abdominal hysterectomy    . Bladder tach  years ago  . Colonoscopy with propofol N/A 06/21/2014    Procedure: COLONOSCOPY WITH PROPOFOL;  Surgeon: Rachael Fee, MD;  Location: [facility];  Service: Endoscopy;  Laterality: N/A;    MEDICATIONS: Current Outpatient  Prescriptions on File Prior to Visit  Medication Sig Dispense Refill  . aspirin EC 325 MG tablet TAKE 1 TABLET (325 MG TOTAL) BY MOUTH DAILY. 30 tablet 0  . atorvastatin (LIPITOR) 20 MG tablet TAKE 1 TABLET DAILY. ([name] taking differently: TAKE 1 TABLET BY MOUTH DAILY.) 90 tablet 0  . carvedilol (COREG) 3.125 MG tablet Take 1 tablet (3.125 mg total) by mouth 2 (two) times daily with a meal. 60 tablet 6  . citalopram (CELEXA) 20 MG tablet TAKE 1 TABLET (20 MG TOTAL) BY MOUTH DAILY. 30 tablet 3  . divalproex (DEPAKOTE ER) 250 MG 24 hr tablet Take 250 mg by mouth every morning.     . donepezil (ARICEPT) 10 MG tablet Take 10 mg by mouth at bedtime.    . furosemide (LASIX) 40 MG tablet Take 40 mg by mouth daily.     . hyoscyamine (LEVSIN/SL) 0.125 MG SL tablet Place 1 tablet under Dana tongue twice daily as needed for cramps and spasms. 60 tablet 0  . insulin aspart (NOVOLOG) 100 UNIT/ML injection Inject 0-15 Units into Dana skin 3 (three) times daily with meals. CBG 70 - 120: 0 units CBG 121 - 150: 2 units CBG 151 - 200: 3 units CBG 201 - 250: 5 units CBG 251 - 300: 8 units 10 mL 11  . insulin glargine (LANTUS) 100 UNIT/ML injection Inject 0.05 mLs (5 Units total) into Dana skin daily. ([name] taking differently: Inject 10 Units into Dana skin daily. ) 10 mL 11  . lisinopril (PRINIVIL,ZESTRIL) 2.5 MG tablet TAKE 1 TABLET (2.5 MG TOTAL) BY MOUTH EVERY MORNING. 30 tablet 3  . memantine (NAMENDA TITRATION PACK) tablet pack Take 7-28 mg by mouth at bedtime. Take 1 tablet (7 mg) for Days 1-7, Take 1 tablet (14 mg) for Days 8-14, Take 1 tablet (21 mg) for Days 15-21, Take 1 tablet (28 mg) Days 22-28.    Marland Kitchen Propylene Glycol (SYSTANE BALANCE OP) Apply 1 drop to eye 3 (three) times daily as needed (dry eyes).    . sodium chloride (OCEAN) 0.65 % SOLN nasal spray Place 2 sprays into both nostrils as needed for congestion.     No current facility-administered medications on file prior to visit.     ALLERGIES: Allergies  Allergen Reactions  . Aspirin Nausea Only  . Codeine Nausea Only    FAMILY HISTORY: Family History  Problem Relation Age of Onset  . Alcohol abuse Son   . Cancer Neg Hx     no cancer in immediate family  . CAD Father   . Colon cancer Neg Hx   . Colon polyps Neg Hx   . Diabetes Father   . Kidney disease Neg Hx   . Esophageal cancer Neg Hx   . Gallbladder disease Neg Hx     SOCIAL HISTORY: Social History   Social History  . Marital Status: Married    Spouse Name: N/A  . Number of Children: 2  . Years of Education: N/A   Occupational History  . Retired    Social History Main Topics  . Smoking status: Never Smoker   . Smokeless tobacco:  Never Used  . Alcohol Use: No  . Drug Use: No  . Sexual Activity: No   Other Topics Concern  . Not on file   Social History Narrative    REVIEW OF SYSTEMS: Constitutional: No fevers, chills, or sweats, no generalized fatigue, change in appetite Eyes: No visual changes, double vision, eye pain Ear, nose and throat: No hearing loss, ear pain, nasal congestion, sore throat Cardiovascular: No chest pain, palpitations Respiratory:  No shortness of breath at rest or with exertion, wheezes GastrointestinaI: No nausea, vomiting, diarrhea, abdominal pain, fecal incontinence Genitourinary:  No dysuria, urinary retention or frequency Musculoskeletal:  No neck pain, back pain Integumentary: No rash, pruritus, skin lesions Neurological: as above Psychiatric: No depression, insomnia, anxiety Endocrine: No palpitations, fatigue, diaphoresis, mood swings, change in appetite, change in weight, increased thirst Hematologic/Lymphatic:  No anemia, purpura, petechiae. Allergic/Immunologic: no itchy/runny eyes, nasal congestion, recent allergic reactions, rashes  PHYSICAL EXAM: Filed Vitals:   03/14/15 1052  BP: 130/74  Pulse: 79  Resp: 16   General: No acute distress Head:  Normocephalic/atraumatic Eyes:  Fundoscopic exam shows bilateral sharp discs, no vessel changes, exudates, or hemorrhages Neck: supple, no paraspinal tenderness, full range of motion Back: No paraspinal tenderness Heart: regular rate and rhythm Lungs: Clear to auscultation bilaterally. Vascular: No carotid bruits. Skin/Extremities: No rash, no edema Neurological Exam: Mental status: alert and oriented to person, place (doctor's office in [place]), states year is 2009 and that she is 25 or 69 years old. She knows her birthday. Does not know Dana president. No dysarthria or aphasia, Fund of knowledge is reduced.  Recent and remote memory are impaired.  Attention and concentration are normal.    Able to name objects and repeat phrases. Cranial nerves: CN I: not tested CN II: pupils equal, round and reactive to light, seems to have some right-sided neglect, able to count fingers on her left side, fundi unremarkable. CN III, IV, VI:  full range of motion, no nystagmus, no ptosis CN V: facial sensation intact CN VII: upper and lower face symmetric CN VIII: hearing intact to finger rub CN IX, X: gag intact, uvula midline CN XI: sternocleidomastoid and trapezius muscles intact CN XII: tongue midline Bulk & Tone: normal, no fasciculations. Motor: 5/5 throughout with no pronator drift. Sensation: intact to light touch, cold, pin, vibration and joint position sense.  No extinction to double simultaneous stimulation.  Romberg test negative Deep Tendon Reflexes: +1 throughout, no ankle clonus Plantar responses: downgoing bilaterally Cerebellar: no incoordination on finger to nose testing Gait: sitting in wheelchair, not tested Tremor: none  IMPRESSION: This is a 69 year old right-handed woman with a history of diabetes, hypertension, hyperlipidemia, dementia, with recurrent episodes of decreased responsiveness with report of shaking. Majority have occurred in Dana setting of hypoglycemia. Her routine EEG showed mild diffuse  slowing. Exam today shows possible right-sided neglect, MRI brain without contrast will be ordered to assess for underlying structural abnormality. No clear indication to start anti-seizure medication at this time. Findings were discussed with her husband. She is now in rehab and glucose levels are being monitored with adjustment in insulin. If further seizure-like episodes occur even with normal glucose levels, ambulatory EEG will be done to further classify these events. Continue Aricept and Namenda, Depakote for agitation. She will follow-up in 3 months.   Thank you for allowing me to participate in Dana care of this [name]. Please do not hesitate to call for any questions or concerns.   Patrcia Dolly,  M.D.  CC: Dr. Everardo All

## 2015-03-14 NOTE — Patient Instructions (Addendum)
1. Schedule open MRI brain without contrast 2. Continue to monitor glucose levels 3. Follow-up in 3 months  4. YOU HAVE BEEN SCHEDULED FOR AN MRI OF THE BRAIN @ [facility] ON 03/26/2015. PLEASE ARRIVE @ 12:30 PM.     [address], [place] [address] [phone]

## 2015-03-19 DIAGNOSIS — R0989 Other specified symptoms and signs involving the circulatory and respiratory systems: Secondary | ICD-10-CM | POA: Diagnosis not present

## 2015-03-19 DIAGNOSIS — R5383 Other fatigue: Secondary | ICD-10-CM | POA: Diagnosis not present

## 2015-03-19 DIAGNOSIS — M6281 Muscle weakness (generalized): Secondary | ICD-10-CM | POA: Diagnosis not present

## 2015-03-19 DIAGNOSIS — R05 Cough: Secondary | ICD-10-CM | POA: Diagnosis not present

## 2015-03-20 ENCOUNTER — Ambulatory Visit (INDEPENDENT_AMBULATORY_CARE_PROVIDER_SITE_OTHER): Payer: Commercial Managed Care - HMO | Admitting: Endocrinology

## 2015-03-20 ENCOUNTER — Encounter: Payer: Self-pay | Admitting: Endocrinology

## 2015-03-20 VITALS — BP 134/97 | HR 76 | Temp 99.1°F | Ht 63.0 in | Wt 175.0 lb

## 2015-03-20 DIAGNOSIS — E109 Type 1 diabetes mellitus without complications: Secondary | ICD-10-CM | POA: Diagnosis not present

## 2015-03-20 MED ORDER — INSULIN LISPRO PROT & LISPRO (50-50 MIX) 100 UNIT/ML KWIKPEN: 25.0000 [IU] | PEN_INJECTOR | Freq: Every day | SUBCUTANEOUS | Status: DC

## 2015-03-20 NOTE — Progress Notes (Signed)
Subjective:    Patient ID: Dana [name], female    DOB: 11-17-1945, 69 y.o.   MRN: [id number]  HPI Pt returns for f/u of diabetes mellitus: DM type: 2 Dx'ed: 1987 Complications: none Therapy: insulin since approx 1990 GDM: never DKA: never Severe hypoglycemia: never Pancreatitis: never Other: therapy has been limited by severe noncompliance and dementia; husband gives her insulin injections (she therefore needs a simple qd insulin regimen), and he also checks cbg's; she was admitted in march of 2014, with severe hyperglycemia.  Interval history: Pt had an episode of severe hypoglycemia a few weeks ago.  It happened in the middle of the day.  I asked husband why this may have happened, but he says nothing different happened that day.  He further says that she had been having hypoglycemia for some days leading up to that day, but did not call us.  In the hospital, she was changed to lantus and prn novolog.  Pt is leaving nursing facility today.   Past Medical History  Diagnosis Date  . ALLERGIC RHINITIS 02/08/2007  . ANXIETY 02/08/2007  . DEPRESSION 02/08/2007  . DIABETES MELLITUS, TYPE I 02/08/2007  . Headache(784.0) 11/06/2008  . HYPERCHOLESTEROLEMIA 11/05/2008  . HYPERTENSION 02/08/2007  . URINARY INCONTINENCE 02/08/2007  . Dyslipidemia   . Non-ulcer dyspepsia   . Leukopenia   . DM retinopathy     background  . 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 Dementia   . CHF (congestive heart failure)   . Anemia     takes iron  . Skin abnormality     both knees with opne ares and left shoulder with open areas, changes dressings prn    Past Surgical History  Procedure Laterality Date  . Total knee arthroplasty  1990    left  . Panendoscopy  10/27/1999  . Stress cardiolite  12/14/2003  . Electrocardiogram  05/17/2006  . Abdominal hysterectomy    . Bladder tach  years ago  . Colonoscopy with propofol N/A 06/21/2014    Procedure: COLONOSCOPY WITH PROPOFOL;  Surgeon: Rachael Fee, MD;  Location: [facility];  Service: Endoscopy;  Laterality: N/A;    Social History   Social History  . Marital Status: Married    Spouse Name: N/A  . Number of Children: 2  . Years of Education: N/A   Occupational History  . Retired    Social History Main Topics  . Smoking status: Never Smoker   . Smokeless tobacco: Never Used  . Alcohol Use: No  . Drug Use: No  . Sexual Activity: No   Other Topics Concern  . Not on file   Social History Narrative    Current Outpatient Prescriptions on File Prior to Visit  Medication Sig Dispense Refill  . aspirin EC 325 MG tablet TAKE 1 TABLET (325 MG TOTAL) BY MOUTH DAILY. 30 tablet 0  . atorvastatin (LIPITOR) 20 MG tablet TAKE 1 TABLET DAILY. (Patient taking differently: TAKE 1 TABLET BY MOUTH DAILY.) 90 tablet 0  . carvedilol (COREG) 3.125 MG tablet Take 1 tablet (3.125 mg total) by mouth 2 (two) times daily with a meal. 60 tablet 6  . citalopram (CELEXA) 20 MG tablet TAKE 1 TABLET (20 MG TOTAL) BY MOUTH DAILY. 30 tablet 3  . divalproex (DEPAKOTE ER) 250 MG 24 hr tablet Take 250 mg by mouth every morning.     . donepezil (ARICEPT) 10 MG tablet Take 10 mg by mouth at bedtime.    Marland Kitchen  furosemide (LASIX) 40 MG tablet Take 40 mg by mouth daily.     . hyoscyamine (LEVSIN/SL) 0.125 MG SL tablet Place 1 tablet under the tongue twice daily as needed for cramps and spasms. 60 tablet 0  . lisinopril (PRINIVIL,ZESTRIL) 2.5 MG tablet TAKE 1 TABLET (2.5 MG TOTAL) BY MOUTH EVERY MORNING. 30 tablet 3  . memantine (NAMENDA TITRATION PACK) tablet pack Take 7-28 mg by mouth at bedtime. Take 1 tablet (7 mg) for Days 1-7, Take 1 tablet (14 mg) for Days 8-14, Take 1 tablet (21 mg) for Days 15-21, Take 1 tablet (28 mg) Days 22-28.    Marland Kitchen Propylene Glycol (SYSTANE BALANCE OP) Apply 1 drop to eye 3 (three) times daily as needed (dry eyes).    . sodium chloride (OCEAN) 0.65 % SOLN nasal spray Place 2 sprays into both nostrils as needed for congestion.      No current facility-administered medications on file prior to visit.    Allergies  Allergen Reactions  . Aspirin Nausea Only  . Codeine Nausea Only    Family History  Problem Relation Age of Onset  . Alcohol abuse Son   . Cancer Neg Hx     no cancer in immediate family  . CAD Father   . Colon cancer Neg Hx   . Colon polyps Neg Hx   . Diabetes Father   . Kidney disease Neg Hx   . Esophageal cancer Neg Hx   . Gallbladder disease Neg Hx     BP 134/97 mmHg  Pulse 76  Temp(Src) 99.1 F (37.3 C) (Oral)  Ht  (1.6 m)  Wt 175 lb (79.379 kg)  BMI 31.01 kg/m2  SpO2 97%   Review of Systems No weight change.  She has cold intolerance.      Objective:   Physical Exam VITAL SIGNS:  See vs page.   GENERAL: no distress.  In wheelchair.   Pulses: dorsalis pedis intact bilat.   MSK: no deformity of the feet CV: trace bilat leg edema Skin:  no ulcer on the feet.  normal color and temp on the feet. Neuro: sensation is intact to touch on the feet.     Lab Results  Component Value Date   HGBA1C 7.4* 02/26/2015   Lab Results  Component Value Date   TSH 1.336 02/27/2015      Assessment & Plan:  DM: due to this episode of severe hypoglycemia, we'll have to reduce intensity of glycemic control.  However, the pattern of cbg's has supported this insulin mixture, so we'll resume at a lower dosage.    Patient is advised the following: Patient Instructions  Please change the insulin back to humalog 50/50, 25 units each morning. On this type of insulin schedule, you should eat meals on a regular schedule, especially lunch.  If a meal is missed or significantly delayed, your blood sugar could go low. check your blood sugar 4 times a day.  before the 3 meals, and at bedtime.  also check if you have symptoms of your blood sugar being too high or too low.  please keep a record of the readings and bring it to your next appointment here.  You can write it on any piece of paper.   please call us sooner if your blood sugar goes below 70, or if you have a lot of readings over 200. Please come back for a follow-up appointment in 2 weeks.

## 2015-03-20 NOTE — Patient Instructions (Addendum)
Please change the insulin back to humalog 50/50, 25 units each morning. On this type of insulin schedule, you should eat meals on a regular schedule, especially lunch.  If a meal is missed or significantly delayed, your blood sugar could go low. check your blood sugar 4 times a day.  before the 3 meals, and at bedtime.  also check if you have symptoms of your blood sugar being too high or too low.  please keep a record of the readings and bring it to your next appointment here.  You can write it on any piece of paper.  please call us sooner if your blood sugar goes below 70, or if you have a lot of readings over 200. Please come back for a follow-up appointment in 2 weeks.

## 2015-03-21 DIAGNOSIS — R55 Syncope and collapse: Secondary | ICD-10-CM | POA: Diagnosis not present

## 2015-03-21 DIAGNOSIS — G4089 Other seizures: Secondary | ICD-10-CM | POA: Diagnosis not present

## 2015-03-21 DIAGNOSIS — G309 Alzheimer's disease, unspecified: Secondary | ICD-10-CM | POA: Diagnosis not present

## 2015-03-21 DIAGNOSIS — R262 Difficulty in walking, not elsewhere classified: Secondary | ICD-10-CM | POA: Diagnosis not present

## 2015-03-21 DIAGNOSIS — M6281 Muscle weakness (generalized): Secondary | ICD-10-CM | POA: Diagnosis not present

## 2015-03-21 DIAGNOSIS — E119 Type 2 diabetes mellitus without complications: Secondary | ICD-10-CM | POA: Diagnosis not present

## 2015-03-21 DIAGNOSIS — F339 Major depressive disorder, recurrent, unspecified: Secondary | ICD-10-CM | POA: Diagnosis not present

## 2015-03-21 DIAGNOSIS — I5020 Unspecified systolic (congestive) heart failure: Secondary | ICD-10-CM | POA: Diagnosis not present

## 2015-03-21 DIAGNOSIS — I10 Essential (primary) hypertension: Secondary | ICD-10-CM | POA: Diagnosis not present

## 2015-03-22 ENCOUNTER — Telehealth: Payer: Self-pay | Admitting: Neurology

## 2015-03-22 NOTE — Telephone Encounter (Signed)
Called and gave Jerrell Mylar # [phone].

## 2015-03-22 NOTE — Telephone Encounter (Signed)
Alvino Chapel from [facility] called in regards to PT and needs a [employer] Authorization for a MRI scheduled for Monday/Dawn CB# [phone] Option 2

## 2015-03-23 DIAGNOSIS — F339 Major depressive disorder, recurrent, unspecified: Secondary | ICD-10-CM | POA: Diagnosis not present

## 2015-03-23 DIAGNOSIS — I10 Essential (primary) hypertension: Secondary | ICD-10-CM | POA: Diagnosis not present

## 2015-03-23 DIAGNOSIS — I5020 Unspecified systolic (congestive) heart failure: Secondary | ICD-10-CM | POA: Diagnosis not present

## 2015-03-23 DIAGNOSIS — G309 Alzheimer's disease, unspecified: Secondary | ICD-10-CM | POA: Diagnosis not present

## 2015-03-23 DIAGNOSIS — M6281 Muscle weakness (generalized): Secondary | ICD-10-CM | POA: Diagnosis not present

## 2015-03-23 DIAGNOSIS — R262 Difficulty in walking, not elsewhere classified: Secondary | ICD-10-CM | POA: Diagnosis not present

## 2015-03-23 DIAGNOSIS — E119 Type 2 diabetes mellitus without complications: Secondary | ICD-10-CM | POA: Diagnosis not present

## 2015-03-23 DIAGNOSIS — G4089 Other seizures: Secondary | ICD-10-CM | POA: Diagnosis not present

## 2015-03-23 DIAGNOSIS — R55 Syncope and collapse: Secondary | ICD-10-CM | POA: Diagnosis not present

## 2015-03-26 DIAGNOSIS — F0390 Unspecified dementia without behavioral disturbance: Secondary | ICD-10-CM | POA: Diagnosis not present

## 2015-03-26 DIAGNOSIS — G9389 Other specified disorders of brain: Secondary | ICD-10-CM | POA: Diagnosis not present

## 2015-03-27 DIAGNOSIS — I5020 Unspecified systolic (congestive) heart failure: Secondary | ICD-10-CM | POA: Diagnosis not present

## 2015-03-27 DIAGNOSIS — I10 Essential (primary) hypertension: Secondary | ICD-10-CM | POA: Diagnosis not present

## 2015-03-27 DIAGNOSIS — M6281 Muscle weakness (generalized): Secondary | ICD-10-CM | POA: Diagnosis not present

## 2015-03-27 DIAGNOSIS — G4089 Other seizures: Secondary | ICD-10-CM | POA: Diagnosis not present

## 2015-03-27 DIAGNOSIS — F339 Major depressive disorder, recurrent, unspecified: Secondary | ICD-10-CM | POA: Diagnosis not present

## 2015-03-27 DIAGNOSIS — R262 Difficulty in walking, not elsewhere classified: Secondary | ICD-10-CM | POA: Diagnosis not present

## 2015-03-27 DIAGNOSIS — G309 Alzheimer's disease, unspecified: Secondary | ICD-10-CM | POA: Diagnosis not present

## 2015-03-27 DIAGNOSIS — E119 Type 2 diabetes mellitus without complications: Secondary | ICD-10-CM | POA: Diagnosis not present

## 2015-03-27 DIAGNOSIS — R55 Syncope and collapse: Secondary | ICD-10-CM | POA: Diagnosis not present

## 2015-03-28 DIAGNOSIS — G309 Alzheimer's disease, unspecified: Secondary | ICD-10-CM | POA: Diagnosis not present

## 2015-03-28 DIAGNOSIS — M6281 Muscle weakness (generalized): Secondary | ICD-10-CM | POA: Diagnosis not present

## 2015-03-28 DIAGNOSIS — I5020 Unspecified systolic (congestive) heart failure: Secondary | ICD-10-CM | POA: Diagnosis not present

## 2015-03-28 DIAGNOSIS — F339 Major depressive disorder, recurrent, unspecified: Secondary | ICD-10-CM | POA: Diagnosis not present

## 2015-03-28 DIAGNOSIS — E119 Type 2 diabetes mellitus without complications: Secondary | ICD-10-CM | POA: Diagnosis not present

## 2015-03-28 DIAGNOSIS — G4089 Other seizures: Secondary | ICD-10-CM | POA: Diagnosis not present

## 2015-03-28 DIAGNOSIS — I10 Essential (primary) hypertension: Secondary | ICD-10-CM | POA: Diagnosis not present

## 2015-03-28 DIAGNOSIS — R55 Syncope and collapse: Secondary | ICD-10-CM | POA: Diagnosis not present

## 2015-03-28 DIAGNOSIS — R262 Difficulty in walking, not elsewhere classified: Secondary | ICD-10-CM | POA: Diagnosis not present

## 2015-03-30 DIAGNOSIS — G4089 Other seizures: Secondary | ICD-10-CM | POA: Diagnosis not present

## 2015-03-30 DIAGNOSIS — I10 Essential (primary) hypertension: Secondary | ICD-10-CM | POA: Diagnosis not present

## 2015-03-30 DIAGNOSIS — R55 Syncope and collapse: Secondary | ICD-10-CM | POA: Diagnosis not present

## 2015-03-30 DIAGNOSIS — R262 Difficulty in walking, not elsewhere classified: Secondary | ICD-10-CM | POA: Diagnosis not present

## 2015-03-30 DIAGNOSIS — E119 Type 2 diabetes mellitus without complications: Secondary | ICD-10-CM | POA: Diagnosis not present

## 2015-03-30 DIAGNOSIS — I5020 Unspecified systolic (congestive) heart failure: Secondary | ICD-10-CM | POA: Diagnosis not present

## 2015-03-30 DIAGNOSIS — G309 Alzheimer's disease, unspecified: Secondary | ICD-10-CM | POA: Diagnosis not present

## 2015-03-30 DIAGNOSIS — M6281 Muscle weakness (generalized): Secondary | ICD-10-CM | POA: Diagnosis not present

## 2015-03-30 DIAGNOSIS — F339 Major depressive disorder, recurrent, unspecified: Secondary | ICD-10-CM | POA: Diagnosis not present

## 2015-03-31 ENCOUNTER — Other Ambulatory Visit: Payer: Self-pay | Admitting: Physician Assistant

## 2015-04-01 ENCOUNTER — Other Ambulatory Visit: Payer: Self-pay | Admitting: Endocrinology

## 2015-04-03 DIAGNOSIS — G4089 Other seizures: Secondary | ICD-10-CM | POA: Diagnosis not present

## 2015-04-03 DIAGNOSIS — F339 Major depressive disorder, recurrent, unspecified: Secondary | ICD-10-CM | POA: Diagnosis not present

## 2015-04-03 DIAGNOSIS — R55 Syncope and collapse: Secondary | ICD-10-CM | POA: Diagnosis not present

## 2015-04-03 DIAGNOSIS — I10 Essential (primary) hypertension: Secondary | ICD-10-CM | POA: Diagnosis not present

## 2015-04-03 DIAGNOSIS — M6281 Muscle weakness (generalized): Secondary | ICD-10-CM | POA: Diagnosis not present

## 2015-04-03 DIAGNOSIS — R262 Difficulty in walking, not elsewhere classified: Secondary | ICD-10-CM | POA: Diagnosis not present

## 2015-04-03 DIAGNOSIS — G309 Alzheimer's disease, unspecified: Secondary | ICD-10-CM | POA: Diagnosis not present

## 2015-04-03 DIAGNOSIS — E119 Type 2 diabetes mellitus without complications: Secondary | ICD-10-CM | POA: Diagnosis not present

## 2015-04-03 DIAGNOSIS — I5020 Unspecified systolic (congestive) heart failure: Secondary | ICD-10-CM | POA: Diagnosis not present

## 2015-04-04 DIAGNOSIS — F339 Major depressive disorder, recurrent, unspecified: Secondary | ICD-10-CM | POA: Diagnosis not present

## 2015-04-04 DIAGNOSIS — I10 Essential (primary) hypertension: Secondary | ICD-10-CM | POA: Diagnosis not present

## 2015-04-04 DIAGNOSIS — R262 Difficulty in walking, not elsewhere classified: Secondary | ICD-10-CM | POA: Diagnosis not present

## 2015-04-04 DIAGNOSIS — R55 Syncope and collapse: Secondary | ICD-10-CM | POA: Diagnosis not present

## 2015-04-04 DIAGNOSIS — G309 Alzheimer's disease, unspecified: Secondary | ICD-10-CM | POA: Diagnosis not present

## 2015-04-04 DIAGNOSIS — I5020 Unspecified systolic (congestive) heart failure: Secondary | ICD-10-CM | POA: Diagnosis not present

## 2015-04-04 DIAGNOSIS — G4089 Other seizures: Secondary | ICD-10-CM | POA: Diagnosis not present

## 2015-04-04 DIAGNOSIS — E119 Type 2 diabetes mellitus without complications: Secondary | ICD-10-CM | POA: Diagnosis not present

## 2015-04-04 DIAGNOSIS — M6281 Muscle weakness (generalized): Secondary | ICD-10-CM | POA: Diagnosis not present

## 2015-04-05 DIAGNOSIS — M6281 Muscle weakness (generalized): Secondary | ICD-10-CM | POA: Diagnosis not present

## 2015-04-05 DIAGNOSIS — G309 Alzheimer's disease, unspecified: Secondary | ICD-10-CM | POA: Diagnosis not present

## 2015-04-05 DIAGNOSIS — R55 Syncope and collapse: Secondary | ICD-10-CM | POA: Diagnosis not present

## 2015-04-05 DIAGNOSIS — R262 Difficulty in walking, not elsewhere classified: Secondary | ICD-10-CM | POA: Diagnosis not present

## 2015-04-05 DIAGNOSIS — E119 Type 2 diabetes mellitus without complications: Secondary | ICD-10-CM | POA: Diagnosis not present

## 2015-04-05 DIAGNOSIS — G4089 Other seizures: Secondary | ICD-10-CM | POA: Diagnosis not present

## 2015-04-05 DIAGNOSIS — I5020 Unspecified systolic (congestive) heart failure: Secondary | ICD-10-CM | POA: Diagnosis not present

## 2015-04-05 DIAGNOSIS — I10 Essential (primary) hypertension: Secondary | ICD-10-CM | POA: Diagnosis not present

## 2015-04-05 DIAGNOSIS — F339 Major depressive disorder, recurrent, unspecified: Secondary | ICD-10-CM | POA: Diagnosis not present

## 2015-04-06 DIAGNOSIS — I10 Essential (primary) hypertension: Secondary | ICD-10-CM | POA: Diagnosis not present

## 2015-04-06 DIAGNOSIS — F339 Major depressive disorder, recurrent, unspecified: Secondary | ICD-10-CM | POA: Diagnosis not present

## 2015-04-06 DIAGNOSIS — R262 Difficulty in walking, not elsewhere classified: Secondary | ICD-10-CM | POA: Diagnosis not present

## 2015-04-06 DIAGNOSIS — M6281 Muscle weakness (generalized): Secondary | ICD-10-CM | POA: Diagnosis not present

## 2015-04-06 DIAGNOSIS — R55 Syncope and collapse: Secondary | ICD-10-CM | POA: Diagnosis not present

## 2015-04-06 DIAGNOSIS — I5020 Unspecified systolic (congestive) heart failure: Secondary | ICD-10-CM | POA: Diagnosis not present

## 2015-04-06 DIAGNOSIS — G4089 Other seizures: Secondary | ICD-10-CM | POA: Diagnosis not present

## 2015-04-06 DIAGNOSIS — G309 Alzheimer's disease, unspecified: Secondary | ICD-10-CM | POA: Diagnosis not present

## 2015-04-06 DIAGNOSIS — E119 Type 2 diabetes mellitus without complications: Secondary | ICD-10-CM | POA: Diagnosis not present

## 2015-04-07 ENCOUNTER — Encounter (HOSPITAL_COMMUNITY): Payer: Self-pay | Admitting: Emergency Medicine

## 2015-04-07 ENCOUNTER — Inpatient Hospital Stay (HOSPITAL_COMMUNITY)
Admission: EM | Admit: 2015-04-07 | Discharge: 2015-04-11 | DRG: 193 | Disposition: A | Discharge status: Home Health | Payer: Commercial Managed Care - HMO | Attending: Internal Medicine | Admitting: Internal Medicine

## 2015-04-07 ENCOUNTER — Emergency Department (HOSPITAL_COMMUNITY): Payer: Commercial Managed Care - HMO

## 2015-04-07 DIAGNOSIS — H540 Blindness, both eyes: Secondary | ICD-10-CM | POA: Diagnosis present

## 2015-04-07 DIAGNOSIS — Z833 Family history of diabetes mellitus: Secondary | ICD-10-CM

## 2015-04-07 DIAGNOSIS — N1330 Unspecified hydronephrosis: Secondary | ICD-10-CM

## 2015-04-07 DIAGNOSIS — R197 Diarrhea, unspecified: Secondary | ICD-10-CM | POA: Diagnosis present

## 2015-04-07 DIAGNOSIS — E1165 Type 2 diabetes mellitus with hyperglycemia: Secondary | ICD-10-CM | POA: Diagnosis not present

## 2015-04-07 DIAGNOSIS — E860 Dehydration: Secondary | ICD-10-CM | POA: Diagnosis not present

## 2015-04-07 DIAGNOSIS — B9620 Unspecified Escherichia coli [E. coli] as the cause of diseases classified elsewhere: Secondary | ICD-10-CM | POA: Diagnosis present

## 2015-04-07 DIAGNOSIS — Y95 Nosocomial condition: Secondary | ICD-10-CM | POA: Diagnosis present

## 2015-04-07 DIAGNOSIS — E1139 Type 2 diabetes mellitus with other diabetic ophthalmic complication: Secondary | ICD-10-CM | POA: Diagnosis not present

## 2015-04-07 DIAGNOSIS — Z8249 Family history of ischemic heart disease and other diseases of the circulatory system: Secondary | ICD-10-CM

## 2015-04-07 DIAGNOSIS — E669 Obesity, unspecified: Secondary | ICD-10-CM | POA: Diagnosis present

## 2015-04-07 DIAGNOSIS — Z794 Long term (current) use of insulin: Secondary | ICD-10-CM

## 2015-04-07 DIAGNOSIS — I6789 Other cerebrovascular disease: Secondary | ICD-10-CM | POA: Diagnosis not present

## 2015-04-07 DIAGNOSIS — E11319 Type 2 diabetes mellitus with unspecified diabetic retinopathy without macular edema: Secondary | ICD-10-CM | POA: Diagnosis present

## 2015-04-07 DIAGNOSIS — N390 Urinary tract infection, site not specified: Secondary | ICD-10-CM | POA: Diagnosis not present

## 2015-04-07 DIAGNOSIS — G9340 Encephalopathy, unspecified: Secondary | ICD-10-CM

## 2015-04-07 DIAGNOSIS — N10 Acute tubulo-interstitial nephritis: Secondary | ICD-10-CM | POA: Diagnosis present

## 2015-04-07 DIAGNOSIS — F0280 Dementia in other diseases classified elsewhere without behavioral disturbance: Secondary | ICD-10-CM | POA: Diagnosis present

## 2015-04-07 DIAGNOSIS — I5022 Chronic systolic (congestive) heart failure: Secondary | ICD-10-CM | POA: Diagnosis not present

## 2015-04-07 DIAGNOSIS — I5042 Chronic combined systolic (congestive) and diastolic (congestive) heart failure: Secondary | ICD-10-CM | POA: Diagnosis not present

## 2015-04-07 DIAGNOSIS — I10 Essential (primary) hypertension: Secondary | ICD-10-CM | POA: Diagnosis not present

## 2015-04-07 DIAGNOSIS — G309 Alzheimer's disease, unspecified: Secondary | ICD-10-CM | POA: Diagnosis present

## 2015-04-07 DIAGNOSIS — N12 Tubulo-interstitial nephritis, not specified as acute or chronic: Secondary | ICD-10-CM | POA: Diagnosis present

## 2015-04-07 DIAGNOSIS — D649 Anemia, unspecified: Secondary | ICD-10-CM | POA: Diagnosis not present

## 2015-04-07 DIAGNOSIS — IMO0002 Reserved for concepts with insufficient information to code with codable children: Secondary | ICD-10-CM | POA: Diagnosis present

## 2015-04-07 DIAGNOSIS — Z6831 Body mass index (BMI) 31.0-31.9, adult: Secondary | ICD-10-CM | POA: Diagnosis not present

## 2015-04-07 DIAGNOSIS — J189 Pneumonia, unspecified organism: Principal | ICD-10-CM | POA: Diagnosis present

## 2015-04-07 DIAGNOSIS — N136 Pyonephrosis: Secondary | ICD-10-CM | POA: Diagnosis present

## 2015-04-07 DIAGNOSIS — H547 Unspecified visual loss: Secondary | ICD-10-CM

## 2015-04-07 DIAGNOSIS — Z96652 Presence of left artificial knee joint: Secondary | ICD-10-CM | POA: Diagnosis present

## 2015-04-07 DIAGNOSIS — R509 Fever, unspecified: Secondary | ICD-10-CM | POA: Diagnosis not present

## 2015-04-07 DIAGNOSIS — R112 Nausea with vomiting, unspecified: Secondary | ICD-10-CM | POA: Diagnosis not present

## 2015-04-07 LAB — URINALYSIS, ROUTINE W REFLEX MICROSCOPIC
Glucose, UA: 100 mg/dL — AB
KETONES UR: NEGATIVE mg/dL
NITRITE: POSITIVE — AB
PROTEIN: 100 mg/dL — AB
Specific Gravity, Urine: 1.019 (ref 1.005–1.030)
UROBILINOGEN UA: 1 mg/dL (ref 0.0–1.0)
pH: 6 (ref 5.0–8.0)

## 2015-04-07 LAB — I-STAT CHEM 8, ED
BUN: 22 mg/dL — AB (ref 6–20)
CALCIUM ION: 1 mmol/L — AB (ref 1.13–1.30)
CREATININE: 0.9 mg/dL (ref 0.44–1.00)
Chloride: 96 mmol/L — ABNORMAL LOW (ref 101–111)
Glucose, Bld: 336 mg/dL — ABNORMAL HIGH (ref 65–99)
HCT: 31 % — ABNORMAL LOW (ref 36.0–46.0)
Hemoglobin: 10.5 g/dL — ABNORMAL LOW (ref 12.0–15.0)
Potassium: 3.7 mmol/L (ref 3.5–5.1)
Sodium: 135 mmol/L (ref 135–145)
TCO2: 30 mmol/L (ref 0–100)

## 2015-04-07 LAB — COMPREHENSIVE METABOLIC PANEL
ALT: 17 U/L (ref 14–54)
AST: 26 U/L (ref 15–41)
Albumin: 2.3 g/dL — ABNORMAL LOW (ref 3.5–5.0)
Alkaline Phosphatase: 94 U/L (ref 38–126)
Anion gap: 10 (ref 5–15)
BUN: 18 mg/dL (ref 6–20)
CHLORIDE: 97 mmol/L — AB (ref 101–111)
CO2: 28 mmol/L (ref 22–32)
Calcium: 7.8 mg/dL — ABNORMAL LOW (ref 8.9–10.3)
Creatinine, Ser: 0.88 mg/dL (ref 0.44–1.00)
GFR calc non Af Amer: >60 mL/min (ref >60)
Glucose, Bld: 331 mg/dL — ABNORMAL HIGH (ref 65–99)
POTASSIUM: 3.7 mmol/L (ref 3.5–5.1)
Sodium: 135 mmol/L (ref 135–145)
Total Bilirubin: 1.1 mg/dL (ref 0.3–1.2)
Total Protein: 6.3 g/dL — ABNORMAL LOW (ref 6.5–8.1)

## 2015-04-07 LAB — CBC WITH DIFFERENTIAL/PLATELET
BASOS ABS: 0 10*3/uL (ref 0.0–0.1)
BASOS PCT: 0 %
EOS ABS: 0 10*3/uL (ref 0.0–0.7)
Eosinophils Relative: 0 %
HCT: 29.6 % — ABNORMAL LOW (ref 36.0–46.0)
Hemoglobin: 9.8 g/dL — ABNORMAL LOW (ref 12.0–15.0)
Lymphocytes Relative: 8 %
Lymphs Abs: 1.3 10*3/uL (ref 0.7–4.0)
MCH: 27 pg (ref 26.0–34.0)
MCHC: 33.1 g/dL (ref 30.0–36.0)
MCV: 81.5 fL (ref 78.0–100.0)
Monocytes Absolute: 1.8 10*3/uL — ABNORMAL HIGH (ref 0.1–1.0)
Monocytes Relative: 11 %
NEUTROS PCT: 81 %
Neutro Abs: 12.9 10*3/uL — ABNORMAL HIGH (ref 1.7–7.7)
PLATELETS: 257 10*3/uL (ref 150–400)
RBC: 3.63 MIL/uL — ABNORMAL LOW (ref 3.87–5.11)
RDW: 15.8 % — AB (ref 11.5–15.5)
WBC: 16 10*3/uL — ABNORMAL HIGH (ref 4.0–10.5)

## 2015-04-07 LAB — URINE MICROSCOPIC-ADD ON

## 2015-04-07 LAB — I-STAT CG4 LACTIC ACID, ED: LACTIC ACID, VENOUS: 1.25 mmol/L (ref 0.5–2.0)

## 2015-04-07 LAB — LIPASE, BLOOD: LIPASE: 26 U/L (ref 22–51)

## 2015-04-07 MED ORDER — ACETAMINOPHEN 325 MG PO TABS
650.0000 mg | ORAL_TABLET | Freq: Once | ORAL | Status: AC
  Administered 2015-04-07: 650 mg via ORAL
  Filled 2015-04-07: qty 2

## 2015-04-07 MED ORDER — INSULIN ASPART 100 UNIT/ML ~~LOC~~ SOLN
10.0000 [IU] | Freq: Once | SUBCUTANEOUS | Status: AC
  Administered 2015-04-07: 10 [IU] via SUBCUTANEOUS
  Filled 2015-04-07: qty 1

## 2015-04-07 MED ORDER — IOHEXOL 300 MG/ML  SOLN
25.0000 mL | Freq: Once | INTRAMUSCULAR | Status: AC | PRN
  Administered 2015-04-07: 25 mL via ORAL

## 2015-04-07 MED ORDER — PIPERACILLIN-TAZOBACTAM 3.375 G IVPB 30 MIN
3.3750 g | Freq: Once | INTRAVENOUS | Status: AC
  Administered 2015-04-07: 3.375 g via INTRAVENOUS
  Filled 2015-04-07: qty 50

## 2015-04-07 MED ORDER — VANCOMYCIN HCL IN DEXTROSE 1-5 GM/200ML-% IV SOLN
1000.0000 mg | Freq: Once | INTRAVENOUS | Status: AC
Start: 2015-04-07 — End: 2015-04-08
  Administered 2015-04-07: 1000 mg via INTRAVENOUS
  Filled 2015-04-07: qty 200

## 2015-04-07 MED ORDER — IOHEXOL 300 MG/ML  SOLN
100.0000 mL | Freq: Once | INTRAMUSCULAR | Status: AC | PRN
  Administered 2015-04-07: 100 mL via INTRAVENOUS

## 2015-04-07 NOTE — ED Notes (Signed)
Bed: ZO10 Expected date:  Expected time:  Means of arrival:  Comments: EMS elderly/diarrhea/fever

## 2015-04-07 NOTE — ED Notes (Signed)
Pt reports diarrhea starting yesterday, concern for fever.  BP 150/78 P 80s  CBG 362

## 2015-04-07 NOTE — ED Notes (Signed)
Patient transported to CT 

## 2015-04-07 NOTE — ED Notes (Signed)
CT contacted to initiate oral contrast.

## 2015-04-07 NOTE — ED Notes (Signed)
Pt has completed oral contrast. 

## 2015-04-07 NOTE — ED Notes (Signed)
Pt not taken to CT-she was taken to XR

## 2015-04-08 ENCOUNTER — Encounter (HOSPITAL_COMMUNITY): Payer: Self-pay | Admitting: Emergency Medicine

## 2015-04-08 ENCOUNTER — Inpatient Hospital Stay (HOSPITAL_COMMUNITY): Payer: Commercial Managed Care - HMO

## 2015-04-08 DIAGNOSIS — E669 Obesity, unspecified: Secondary | ICD-10-CM | POA: Diagnosis present

## 2015-04-08 DIAGNOSIS — I5022 Chronic systolic (congestive) heart failure: Secondary | ICD-10-CM

## 2015-04-08 DIAGNOSIS — E1165 Type 2 diabetes mellitus with hyperglycemia: Secondary | ICD-10-CM | POA: Diagnosis present

## 2015-04-08 DIAGNOSIS — G309 Alzheimer's disease, unspecified: Secondary | ICD-10-CM | POA: Diagnosis present

## 2015-04-08 DIAGNOSIS — G9340 Encephalopathy, unspecified: Secondary | ICD-10-CM | POA: Diagnosis present

## 2015-04-08 DIAGNOSIS — B9620 Unspecified Escherichia coli [E. coli] as the cause of diseases classified elsewhere: Secondary | ICD-10-CM | POA: Diagnosis present

## 2015-04-08 DIAGNOSIS — H540 Blindness, both eyes: Secondary | ICD-10-CM | POA: Diagnosis present

## 2015-04-08 DIAGNOSIS — I10 Essential (primary) hypertension: Secondary | ICD-10-CM | POA: Diagnosis present

## 2015-04-08 DIAGNOSIS — Z6831 Body mass index (BMI) 31.0-31.9, adult: Secondary | ICD-10-CM | POA: Diagnosis not present

## 2015-04-08 DIAGNOSIS — H547 Unspecified visual loss: Secondary | ICD-10-CM

## 2015-04-08 DIAGNOSIS — R197 Diarrhea, unspecified: Secondary | ICD-10-CM | POA: Diagnosis not present

## 2015-04-08 DIAGNOSIS — D649 Anemia, unspecified: Secondary | ICD-10-CM | POA: Diagnosis present

## 2015-04-08 DIAGNOSIS — J189 Pneumonia, unspecified organism: Secondary | ICD-10-CM | POA: Diagnosis present

## 2015-04-08 DIAGNOSIS — E1139 Type 2 diabetes mellitus with other diabetic ophthalmic complication: Secondary | ICD-10-CM | POA: Diagnosis not present

## 2015-04-08 DIAGNOSIS — Z8249 Family history of ischemic heart disease and other diseases of the circulatory system: Secondary | ICD-10-CM | POA: Diagnosis not present

## 2015-04-08 DIAGNOSIS — F0280 Dementia in other diseases classified elsewhere without behavioral disturbance: Secondary | ICD-10-CM | POA: Diagnosis present

## 2015-04-08 DIAGNOSIS — N12 Tubulo-interstitial nephritis, not specified as acute or chronic: Secondary | ICD-10-CM | POA: Diagnosis present

## 2015-04-08 DIAGNOSIS — Z794 Long term (current) use of insulin: Secondary | ICD-10-CM | POA: Diagnosis not present

## 2015-04-08 DIAGNOSIS — N1330 Unspecified hydronephrosis: Secondary | ICD-10-CM | POA: Diagnosis not present

## 2015-04-08 DIAGNOSIS — N10 Acute tubulo-interstitial nephritis: Secondary | ICD-10-CM | POA: Diagnosis present

## 2015-04-08 DIAGNOSIS — Z833 Family history of diabetes mellitus: Secondary | ICD-10-CM | POA: Diagnosis not present

## 2015-04-08 DIAGNOSIS — Z96652 Presence of left artificial knee joint: Secondary | ICD-10-CM | POA: Diagnosis present

## 2015-04-08 DIAGNOSIS — Y95 Nosocomial condition: Secondary | ICD-10-CM | POA: Diagnosis present

## 2015-04-08 DIAGNOSIS — N136 Pyonephrosis: Secondary | ICD-10-CM | POA: Diagnosis present

## 2015-04-08 DIAGNOSIS — I5042 Chronic combined systolic (congestive) and diastolic (congestive) heart failure: Secondary | ICD-10-CM | POA: Diagnosis present

## 2015-04-08 DIAGNOSIS — E11319 Type 2 diabetes mellitus with unspecified diabetic retinopathy without macular edema: Secondary | ICD-10-CM | POA: Diagnosis present

## 2015-04-08 DIAGNOSIS — I6789 Other cerebrovascular disease: Secondary | ICD-10-CM | POA: Diagnosis not present

## 2015-04-08 LAB — GLUCOSE, CAPILLARY
GLUCOSE-CAPILLARY: 174 mg/dL — AB (ref 65–99)
Glucose-Capillary: 283 mg/dL — ABNORMAL HIGH (ref 65–99)
Glucose-Capillary: 234 mg/dL — ABNORMAL HIGH (ref 65–99)
Glucose-Capillary: 178 mg/dL — ABNORMAL HIGH (ref 65–99)

## 2015-04-08 LAB — CBG MONITORING, ED: GLUCOSE-CAPILLARY: 259 mg/dL — AB (ref 65–99)

## 2015-04-08 MED ORDER — MEMANTINE HCL ER 28 MG PO CP24
28.0000 mg | ORAL_CAPSULE | Freq: Every day | ORAL | Status: DC
  Administered 2015-04-08 – 2015-04-11 (×4): 28 mg via ORAL
  Filled 2015-04-08 (×4): qty 1

## 2015-04-08 MED ORDER — HYOSCYAMINE SULFATE 0.125 MG SL SUBL
0.1250 mg | SUBLINGUAL_TABLET | Freq: Four times a day (QID) | SUBLINGUAL | Status: DC | PRN
  Filled 2015-04-08: qty 1

## 2015-04-08 MED ORDER — ONDANSETRON HCL 4 MG PO TABS: 4.0000 mg | ORAL_TABLET | Freq: Four times a day (QID) | ORAL | Status: DC | PRN

## 2015-04-08 MED ORDER — ONDANSETRON HCL 4 MG/2ML IJ SOLN: 4.0000 mg | Freq: Four times a day (QID) | INTRAMUSCULAR | Status: DC | PRN

## 2015-04-08 MED ORDER — ASPIRIN EC 325 MG PO TBEC
325.0000 mg | DELAYED_RELEASE_TABLET | Freq: Every day | ORAL | Status: DC
  Administered 2015-04-08 – 2015-04-11 (×4): 325 mg via ORAL
  Filled 2015-04-08 (×4): qty 1

## 2015-04-08 MED ORDER — INSULIN GLARGINE 100 UNIT/ML ~~LOC~~ SOLN
20.0000 [IU] | Freq: Every day | SUBCUTANEOUS | Status: DC
  Administered 2015-04-08 – 2015-04-10 (×3): 20 [IU] via SUBCUTANEOUS
  Filled 2015-04-08 (×4): qty 0.2

## 2015-04-08 MED ORDER — LISINOPRIL 2.5 MG PO TABS
2.5000 mg | ORAL_TABLET | Freq: Every day | ORAL | Status: DC
  Administered 2015-04-08 – 2015-04-11 (×4): 2.5 mg via ORAL
  Filled 2015-04-08 (×4): qty 1

## 2015-04-08 MED ORDER — ACETAMINOPHEN 325 MG PO TABS: 650.0000 mg | ORAL_TABLET | Freq: Four times a day (QID) | ORAL | Status: DC | PRN

## 2015-04-08 MED ORDER — SODIUM CHLORIDE 0.9 % IV SOLN
INTRAVENOUS | Status: AC
Start: 2015-04-08 — End: 2015-04-08

## 2015-04-08 MED ORDER — ACETAMINOPHEN 650 MG RE SUPP: 650.0000 mg | Freq: Four times a day (QID) | RECTAL | Status: DC | PRN

## 2015-04-08 MED ORDER — PIPERACILLIN-TAZOBACTAM 3.375 G IVPB 30 MIN: 3.3750 g | Freq: Three times a day (TID) | INTRAVENOUS | Status: DC

## 2015-04-08 MED ORDER — CARVEDILOL 3.125 MG PO TABS
3.1250 mg | ORAL_TABLET | Freq: Two times a day (BID) | ORAL | Status: DC
  Administered 2015-04-08 – 2015-04-11 (×7): 3.125 mg via ORAL
  Filled 2015-04-08 (×9): qty 1

## 2015-04-08 MED ORDER — MEMANTINE HCL 28 X 5 MG & 21 X 10 MG PO TABS: 7.0000 mg | ORAL_TABLET | Freq: Every day | ORAL | Status: DC

## 2015-04-08 MED ORDER — PIPERACILLIN-TAZOBACTAM 3.375 G IVPB
3.3750 g | Freq: Three times a day (TID) | INTRAVENOUS | Status: DC
  Administered 2015-04-08 – 2015-04-11 (×10): 3.375 g via INTRAVENOUS
  Filled 2015-04-08 (×12): qty 50

## 2015-04-08 MED ORDER — INSULIN ASPART 100 UNIT/ML ~~LOC~~ SOLN
0.0000 [IU] | Freq: Three times a day (TID) | SUBCUTANEOUS | Status: DC
  Administered 2015-04-08: 5 [IU] via SUBCUTANEOUS
  Administered 2015-04-08 – 2015-04-09 (×4): 3 [IU] via SUBCUTANEOUS
  Administered 2015-04-10 (×2): 5 [IU] via SUBCUTANEOUS
  Administered 2015-04-10: 7 [IU] via SUBCUTANEOUS
  Administered 2015-04-11: 2 [IU] via SUBCUTANEOUS
  Administered 2015-04-11: 5 [IU] via SUBCUTANEOUS
  Administered 2015-04-11: 3 [IU] via SUBCUTANEOUS

## 2015-04-08 MED ORDER — VANCOMYCIN HCL IN DEXTROSE 750-5 MG/150ML-% IV SOLN
750.0000 mg | Freq: Two times a day (BID) | INTRAVENOUS | Status: DC
  Administered 2015-04-08 – 2015-04-11 (×7): 750 mg via INTRAVENOUS
  Filled 2015-04-08 (×8): qty 150

## 2015-04-08 MED ORDER — CITALOPRAM HYDROBROMIDE 20 MG PO TABS
20.0000 mg | ORAL_TABLET | Freq: Every day | ORAL | Status: DC
  Administered 2015-04-08 – 2015-04-11 (×4): 20 mg via ORAL
  Filled 2015-04-08 (×4): qty 1

## 2015-04-08 MED ORDER — DONEPEZIL HCL 10 MG PO TABS
10.0000 mg | ORAL_TABLET | Freq: Every day | ORAL | Status: DC
  Administered 2015-04-08 – 2015-04-10 (×3): 10 mg via ORAL
  Filled 2015-04-08 (×4): qty 1

## 2015-04-08 MED ORDER — DIVALPROEX SODIUM ER 250 MG PO TB24
250.0000 mg | ORAL_TABLET | Freq: Every morning | ORAL | Status: DC
  Administered 2015-04-08 – 2015-04-11 (×4): 250 mg via ORAL
  Filled 2015-04-08 (×4): qty 1

## 2015-04-08 MED ORDER — ENOXAPARIN SODIUM 40 MG/0.4ML ~~LOC~~ SOLN
40.0000 mg | SUBCUTANEOUS | Status: DC
  Administered 2015-04-08 – 2015-04-11 (×4): 40 mg via SUBCUTANEOUS
  Filled 2015-04-08 (×4): qty 0.4

## 2015-04-08 MED ORDER — ATORVASTATIN CALCIUM 20 MG PO TABS
20.0000 mg | ORAL_TABLET | Freq: Every day | ORAL | Status: DC
  Administered 2015-04-09 – 2015-04-11 (×3): 20 mg via ORAL
  Filled 2015-04-08 (×4): qty 1

## 2015-04-08 NOTE — H&P (Signed)
[facility] History and Physical  Dana [name] ZOX:[id number] DOB: [birth year] DOA: 04/07/2015  Referring physician: Dr. Franky Macho. PCP: Romero Belling, MD  Specialists: None.  Chief Complaint: Abdominal discomfort and diarrhea.  HPI: Dana [name] is a 69 y.o. female with history of dementia, diabetes mellitus type 2, chronic systolic heart failure was brought to the ER after patient was found to have increasing abdominal discomfort and diarrhea over the last 3-4 days. Patient also has been some nausea and poor appetite. UA shows features consistent with UTI. CT abdomen and pelvis done shows features concerning for left-sided pyelonephritis. On exam patient is not in distress and is a poor historian secondary to known history of dementia. As per patient's husband patient did not complain of any shortness of breath productive cough or have any chest pain. X-rays do reveal concerning features for pneumonia. Patient was admitted last month for hypoglycemia and was discharged to rehabilitation.  Review of Systems: As presented in the history of presenting illness, rest negative.  Past Medical History  Diagnosis Date  . ALLERGIC RHINITIS 02/08/2007  . ANXIETY 02/08/2007  . DEPRESSION 02/08/2007  . DIABETES MELLITUS, TYPE I 02/08/2007  . Headache(784.0) 11/06/2008  . HYPERCHOLESTEROLEMIA 11/05/2008  . HYPERTENSION 02/08/2007  . URINARY INCONTINENCE 02/08/2007  . Dyslipidemia   . Non-ulcer dyspepsia   . Leukopenia   . DM retinopathy     background  . 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 Dementia   . CHF (congestive heart failure)   . Anemia     takes iron  . Skin abnormality     both knees with opne ares and left shoulder with open areas, changes dressings prn   Past Surgical History  Procedure Laterality Date  . Total knee arthroplasty  1990    left  . Panendoscopy  10/27/1999  . Stress cardiolite  12/14/2003  . Electrocardiogram  05/17/2006  . Abdominal hysterectomy     . Bladder tach  years ago  . Colonoscopy with propofol N/A 06/21/2014    Procedure: COLONOSCOPY WITH PROPOFOL;  Surgeon: Rachael Fee, MD;  Location: [facility];  Service: Endoscopy;  Laterality: N/A;   Social History:  reports that she has never smoked. She has never used smokeless tobacco. She reports that she does not drink alcohol or use illicit drugs. Where does patient live home. Can patient participate in ADLs? No.  Allergies  Allergen Reactions  . Aspirin Nausea Only  . Codeine Nausea Only    Family History:  Family History  Problem Relation Age of Onset  . Alcohol abuse Son   . Cancer Neg Hx     no cancer in immediate family  . CAD Father   . Colon cancer Neg Hx   . Colon polyps Neg Hx   . Diabetes Father   . Kidney disease Neg Hx   . Esophageal cancer Neg Hx   . Gallbladder disease Neg Hx       Prior to Admission medications   Medication Sig Start Date End Date Taking? Authorizing Provider  aspirin EC 325 MG tablet TAKE 1 TABLET (325 MG TOTAL) BY MOUTH DAILY. 03/04/15  Yes Kathleene Hazel, MD  atorvastatin (LIPITOR) 20 MG tablet TAKE 1 TABLET BY MOUTH EVERY DAY 04/01/15  Yes Romero Belling, MD  carvedilol (COREG) 3.125 MG tablet Take 1 tablet (3.125 mg total) by mouth 2 (two) times daily with a meal. 07/17/14  Yes Kathleene Hazel, MD  citalopram (CELEXA) 20  MG tablet TAKE 1 TABLET (20 MG TOTAL) BY MOUTH DAILY. 10/29/14  Yes Romero Belling, MD  divalproex (DEPAKOTE ER) 250 MG 24 hr tablet Take 250 mg by mouth every morning.  07/09/14  Yes Historical Provider, MD  donepezil (ARICEPT) 10 MG tablet Take 10 mg by mouth at bedtime.   Yes Historical Provider, MD  furosemide (LASIX) 40 MG tablet Take 40 mg by mouth daily.    Yes Historical Provider, MD  hyoscyamine (LEVSIN SL) 0.125 MG SL tablet PLACE 1 TABLET UNDER THE TONGUE TWICE DAILY AS NEEDED FOR CRAMPS AND SPASMS. 04/01/15  Yes Lori P Hvozdovic, PA-C  Insulin Lispro Prot & Lispro (HUMALOG MIX 50/50 KWIKPEN)  (50-50) 100 UNIT/ML Kwikpen Inject 25 Units into the skin daily with breakfast. 03/20/15  Yes Romero Belling, MD  lisinopril (PRINIVIL,ZESTRIL) 2.5 MG tablet TAKE 1 TABLET (2.5 MG TOTAL) BY MOUTH EVERY MORNING. 03/11/15  Yes Kathleene Hazel, MD  memantine [facility] TITRATION PACK) tablet pack Take 7-28 mg by mouth at bedtime. Take 1 tablet (7 mg) for Days 1-7, Take 1 tablet (14 mg) for Days 8-14, Take 1 tablet (21 mg) for Days 15-21, Take 1 tablet (28 mg) Days 22-28.   Yes Historical Provider, MD  memantine (NAMENDA XR) 28 MG CP24 24 hr capsule Take 28 mg by mouth daily.   Yes Historical Provider, MD  Propylene Glycol (SYSTANE BALANCE OP) Apply 1 drop to eye 3 (three) times daily as needed (dry eyes).   Yes Historical Provider, MD  sodium chloride (OCEAN) 0.65 % SOLN nasal spray Place 2 sprays into both nostrils as needed for congestion.   Yes Historical Provider, MD    Physical Exam: Filed Vitals:   04/08/15 0030 04/08/15 0100 04/08/15 0125 04/08/15 0158  BP: 114/51 122/50  125/55  Pulse: 72 72  66  Temp:   97.6 F (36.4 C) 98.9 F (37.2 C)  TempSrc:   Oral Oral  Resp: 23 19  18   Height:    5\' 3"  (1.6 m)  Weight:    79.3 kg (174 lb 13.2 oz)  SpO2: 97% 99%  95%     General:  Moderately built and nourished.  Eyes: Anicteric no pallor.  ENT: No discharge from the ears eyes nose and mouth.  Neck: No mass found.  Cardiovascular: S1 and S2 heard.  Respiratory: No rhonchi or crepitations.  Abdomen: Soft nontender bowel sounds present.  Skin: No rash.  Musculoskeletal: No edema.  Psychiatric: Patient has history of dementia.  Neurologic: Alert awake oriented to name only. Moves all extremities.  Labs on Admission:  Basic Metabolic Panel:  Recent Labs Lab 04/07/15 2026 04/07/15 2203  NA 135 135  K 3.7 3.7  CL 97* 96*  CO2 28  --   GLUCOSE 331* 336*  BUN 18 22*  CREATININE 0.88 0.90  CALCIUM 7.8*  --    Liver Function Tests:  Recent Labs Lab 04/07/15 2026   AST 26  ALT 17  ALKPHOS 94  BILITOT 1.1  PROT 6.3*  ALBUMIN 2.3*    Recent Labs Lab 04/07/15 2026  LIPASE 26   No results for input(s): AMMONIA in the last 168 hours. CBC:  Recent Labs Lab 04/07/15 2026 04/07/15 2203  WBC 16.0*  --   NEUTROABS 12.9*  --   HGB 9.8* 10.5*  HCT 29.6* 31.0*  MCV 81.5  --   PLT 257  --    Cardiac Enzymes: No results for input(s): CKTOTAL, CKMB, CKMBINDEX, TROPONINI in the last 168  hours.  BNP (last 3 results) No results for input(s): BNP in the last 8760 hours.  ProBNP (last 3 results)  Recent Labs  06/18/14 1553  PROBNP 148.0*    CBG:  Recent Labs Lab 04/08/15 0123  GLUCAP 259*    Radiological Exams on Admission: Dg Chest 2 View  04/07/2015   CLINICAL DATA:  Diarrhea beginning yesterday, fever, shortness of breath, CHF, hypertension, type I diabetes mellitus  EXAM: CHEST  2 VIEW  COMPARISON:  02/26/2015  FINDINGS: Enlargement of cardiac silhouette.  Atherosclerotic calcification aorta.  Normal mediastinal contours.  Infiltrate identified in LEFT lower lobe question pneumonia.  Minimal infiltrate versus atelectasis at RIGHT base.  Remaining lungs clear.  No [place] pleural effusion or pneumothorax.  IMPRESSION: Enlargement of cardiac silhouette with minimal atelectasis or infiltrate at RIGHT base.  LEFT lower lobe consolidation consistent with pneumonia.   Electronically Signed   By: Ulyses Southward M.D.   On: 04/07/2015 21:38   Ct Abdomen Pelvis W Contrast  04/08/2015   CLINICAL DATA:  Fever and diarrhea for 2 days. White cell count 16. Red and white cells in urine. Abdominal pain.  EXAM: CT ABDOMEN AND PELVIS WITH CONTRAST  TECHNIQUE: Multidetector CT imaging of the abdomen and pelvis was performed using the standard protocol following bolus administration of intravenous contrast.  CONTRAST:  OMNIPAQUE IOHEXOL 300 MG/ML SOLN, 25mL OMNIPAQUE IOHEXOL 300 MG/ML SOLN  COMPARISON:  None.  FINDINGS: Small bilateral pleural effusions  with basilar atelectasis or infiltration, greater on the left. Basilar pneumonia not excluded. Mild cardiac enlargement. Coronary artery calcifications.  Mild gallbladder wall thickening. No stones or infiltration demonstrated. No bile duct dilatation. Liver, spleen, pancreas, adrenal glands, abdominal aorta, inferior vena cava, and retroperitoneal lymph nodes are unremarkable. Nephrograms are asymmetrical with mild delayed of left nephrogram and mildly heterogeneous left nephrogram. There is mild left hydronephrosis and hydroureter with stranding around the left kidney and along the left ureter. No obstructing lesion or stone is definitively identified. However, there are multiple calcifications in the left renal pelvis which probably represent phleboliths. This may obscure identification of a small stone. Findings could represent obstruction due to occult stone or inflammatory stricture, or pyelonephritis and ureteritis. Stomach, small bowel, and colon are mostly decompressed. Inflammatory stranding in the left pericolic gutter probably arises from the left kidney but a colonic inflammatory process is not excluded. Edema in the left flank musculature also may be inflammatory. No free air in the abdomen.  Pelvis: Bladder wall is thickened, probably due to under distention although infection could also have this appearance. Calcification in the left ovary probably dystrophic. Appendix is not definitively identified. No free or loculated pelvic fluid collections. No pelvic mass or lymphadenopathy. Degenerative changes throughout the lumbar spine. Loss of disc space at L4-5 without evidence of bone destruction is probably degenerative. Probable stenosis is of the lumbar spine at L3-4.  IMPRESSION: Asymmetric left renal nephrogram with mild left pyelocaliectasis and ureterectasis. No obstructing stones are demonstrated. Changes could be due to occult obstructing stone or lesion versus pyelonephritis and ureteritis.  There is inflammatory stranding around the left kidney in ureter and in the left costophrenic angle region. Edema in the left flank subcutaneous fat. Bladder wall thickening could be due to infection or under distention. Infiltration or atelectasis in both lung bases with small pleural effusions, greater on the left.   Electronically Signed   By: Burman Nieves M.D.   On: 04/08/2015 00:33    EKG: Independently reviewed. Sinus rhythm  with LBBB.  Assessment/Plan Principal Problem:   HCAP (healthcare-associated pneumonia) Active Problems:   Essential hypertension   Diabetes mellitus type 2, uncontrolled   Alzheimer's disease   Acute pyelonephritis   Chronic systolic CHF (congestive heart failure) 40-45% 8/16.   Pyelonephritis   1. Healthcare associated pneumonia and pyelonephritis with diarrhea - patient has been placed on vancomycin and Zosyn for pneumonia which will be treated as healthcare associated and also for pyelonephritis. Follow blood cultures urine cultures and continue with IV hydration. Check for C. difficile since patient also has diarrhea. 2. Chronic systolic heart failure last year measured in last month was 40-45% - holding of Lasix and gently hydrating. Caution the patient can get fluid overloaded given to systolic heart failure. 3. Diabetes mellitus type 2 uncontrolled - since patient has poor oral intake I have reduce patient's insulin dose and changed home dose of Lantus for now. Closely observe CBGs. 4. Dementia - continue present medications. 5. Chronic anemia - follow CBC.  I have reviewed patient's old charts and labs. Personally reviewed x-rays and EKG.   DVT Prophylaxis Lovenox.  Code Status: Full code.  Family Communication: Discussed with patient's husband.  Disposition Plan: Admit to inpatient.    KAKRAKANDY,ARSHAD N. [facility] Pager [phone].  If 7PM-7AM, please contact night-coverage [email or web address] Password [email or web address] 04/08/2015, 2:48  AM

## 2015-04-08 NOTE — Progress Notes (Signed)
ANTIBIOTIC CONSULT NOTE - INITIAL  Pharmacy Consult for Vancomycin and Zosyn  Indication: PNA  Allergies  Allergen Reactions  . Aspirin Nausea Only  . Codeine Nausea Only    Patient Measurements: Height:  (160 cm) Weight: 174 lb 13.2 oz (79.3 kg) IBW/kg (Calculated) : 52.4 Adjusted Body Weight:   Vital Signs: Temp: 98.9 F (37.2 C) (09/19 0158) Temp Source: Oral (09/19 0158) BP: 125/55 mmHg (09/19 0158) Pulse Rate: 66 (09/19 0158) Intake/Output from previous day:   Intake/Output from this shift:    Labs:  Recent Labs  04/07/15 2026 04/07/15 2203  WBC 16.0*  --   HGB 9.8* 10.5*  PLT 257  --   CREATININE 0.88 0.90   Estimated Creatinine Clearance: 57.2 mL/min (by C-G formula based on Cr of 0.9). No results for input(s): VANCOTROUGH, VANCOPEAK, VANCORANDOM, GENTTROUGH, GENTPEAK, GENTRANDOM, TOBRATROUGH, TOBRAPEAK, TOBRARND, AMIKACINPEAK, AMIKACINTROU, AMIKACIN in the last 72 hours.   Microbiology: No results found for this or any previous visit (from the past 720 hour(s)).  Medical History: Past Medical History  Diagnosis Date  . ALLERGIC RHINITIS 02/08/2007  . ANXIETY 02/08/2007  . DEPRESSION 02/08/2007  . DIABETES MELLITUS, TYPE I 02/08/2007  . Headache(784.0) 11/06/2008  . HYPERCHOLESTEROLEMIA 11/05/2008  . HYPERTENSION 02/08/2007  . URINARY INCONTINENCE 02/08/2007  . Dyslipidemia   . Non-ulcer dyspepsia   . Leukopenia   . DM retinopathy     background  . 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 Dementia   . CHF (congestive heart failure)   . Anemia     takes iron  . Skin abnormality     both knees with opne ares and left shoulder with open areas, changes dressings prn    Medications:  Anti-infectives    Start     Dose/Rate Route Frequency Ordered Stop   04/08/15 1000  vancomycin (VANCOCIN) IVPB 750 mg/150 ml premix     750 mg 150 mL/hr over 60 Minutes Intravenous Every 12 hours 04/08/15 0534     04/08/15 0600  piperacillin-tazobactam  (ZOSYN) IVPB 3.375 g  Status:  Discontinued     3.375 g 100 mL/hr over 30 Minutes Intravenous 3 times per day 04/08/15 0247 04/08/15 0251   04/08/15 0600  piperacillin-tazobactam (ZOSYN) IVPB 3.375 g     3.375 g 12.5 mL/hr over 240 Minutes Intravenous 3 times per day 04/08/15 0252     04/07/15 2230  piperacillin-tazobactam (ZOSYN) IVPB 3.375 g     3.375 g 100 mL/hr over 30 Minutes Intravenous  Once 04/07/15 2215 04/07/15 2322   04/07/15 2230  vancomycin (VANCOCIN) IVPB 1000 mg/200 mL premix     1,000 mg 200 mL/hr over 60 Minutes Intravenous  Once 04/07/15 2215 04/08/15 0037     Assessment: Patient with PNA/UTI.  First dose of antibiotics already given in ED.    Goal of Therapy:  Vancomycin trough level 15-20 mcg/ml  Zosyn based on renal function   Plan:  Measure antibiotic drug levels at steady state Follow up culture results Vancomycin  iv q12hr  Zosyn 3.375g IV Q8H infused over 4hrs.   Darlina Guys, Jacquenette Shone Crowford 04/08/2015,5:34 AM

## 2015-04-08 NOTE — Progress Notes (Signed)
PROGRESS NOTE  Dana [name] ZOX:[id number] DOB: Jun 27, 1946 DOA: 04/07/2015 PCP: Romero Belling, MD  HPI/Recap of past 48 hours: 69 year old female with past medical history of Alzheimer's dementia, diabetes mellitus with secondary blindness and CHF admitted on 9/19 for pyelonephritis as well as healthcare associated pneumonia after coming in complaining of bowel discomfort and CT noting pyelonephritis.  Assessment/Plan: Principal Problem:   HCAP (healthcare-associated pneumonia): Started on broad-spectrum antibiotics. Have consulted speech therapy to check swallow evaluation Active Problems:   Essential hypertension: Blood pressure stable   Diabetes mellitus type 2, uncontrolled: CBGs above 200, likely in the setting of acute infection. A1c done last month at 7.4   Alzheimer's disease:   Acute pyelonephritis: Continue antibiotics   Chronic systolic CHF (congestive heart failure) 40-45% 8/16.: Looks to be euvolemic. Breathing stable.    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: Assistance with eating   Code Status: Full code  Family Communication: Left message with family  Disposition Plan: Back to nursing facility once white count normalized and confirmed no signs of aspiration   Consultants:  None  Procedures:  None  Antibiotics:  IV Zosyn 9/19-present  IV vancomycin 9/19-present   Objective: BP 156/57 mmHg  Pulse 81  Temp(Src) 99 F (37.2 C) (Oral)  Resp 18  Ht  (1.6 m)  Wt 79.3 kg (174 lb 13.2 oz)  BMI 30.98 kg/m2  SpO2 97%  Intake/Output Summary (Last 24 hours) at 04/08/15 1450 Last data filed at 04/08/15 0948  Gross per 24 hour  Intake      0 ml  Output      4 ml  Net     -4 ml   Filed Weights   04/07/15 2026 04/08/15 0158  Weight: 79.379 kg (175 lb) 79.3 kg (174 lb 13.2 oz)    Exam:   General:  Alert and oriented 2, no acute distress  Cardiovascular: Regular rate and rhythm, S1-S2, 2/6 systolic ejection murmur  Respiratory:  Decreased breath sounds bibasilar  Abdomen: Soft, obese, nontender, hypoactive bowel sounds  Musculoskeletal: No clubbing or cyanosis, trace edema   Data Reviewed: Basic Metabolic Panel:  Recent Labs Lab 04/07/15 2026 04/07/15 2203  NA 135 135  K 3.7 3.7  CL 97* 96*  CO2 28  --   GLUCOSE 331* 336*  BUN 18 22*  CREATININE 0.88 0.90  CALCIUM 7.8*  --    Liver Function Tests:  Recent Labs Lab 04/07/15 2026  AST 26  ALT 17  ALKPHOS 94  BILITOT 1.1  PROT 6.3*  ALBUMIN 2.3*    Recent Labs Lab 04/07/15 2026  LIPASE 26   No results for input(s): AMMONIA in the last 168 hours. CBC:  Recent Labs Lab 04/07/15 2026 04/07/15 2203  WBC 16.0*  --   NEUTROABS 12.9*  --   HGB 9.8* 10.5*  HCT 29.6* 31.0*  MCV 81.5  --   PLT 257  --    Cardiac Enzymes:   No results for input(s): CKTOTAL, CKMB, CKMBINDEX, TROPONINI in the last 168 hours. BNP (last 3 results) No results for input(s): BNP in the last 8760 hours.  ProBNP (last 3 results)  Recent Labs  06/18/14 1553  PROBNP 148.0*    CBG:  Recent Labs Lab 04/08/15 0123 04/08/15 0833 04/08/15 1136  GLUCAP 259* 283* 234*    Recent Results (from the past 240 hour(s))  Culture, blood (routine x 2)     Status: None (Preliminary result)   Collection Time: 04/07/15  7:57 PM  Result Value Ref Range Status   Specimen Description BLOOD RIGHT ARM  Final   Special Requests BOTTLES DRAWN AEROBIC AND ANAEROBIC  Final   Culture  Setup Time   Final    GRAM NEGATIVE RODS ANAEROBIC BOTTLE ONLY CRITICAL RESULT CALLED TO, READ BACK BY AND VERIFIED WITH: Q HOLLAWAY 04/08/15 @ 1245 M VESTAL    Culture   Final    GRAM NEGATIVE RODS Performed at [facility]    Report Status PENDING  Incomplete     Studies: Dg Chest 2 View  04/07/2015   CLINICAL DATA:  Diarrhea beginning yesterday, fever, shortness of breath, CHF, hypertension, type I diabetes mellitus  EXAM: CHEST  2 VIEW  COMPARISON:  02/26/2015  FINDINGS:  Enlargement of cardiac silhouette.  Atherosclerotic calcification aorta.  Normal mediastinal contours.  Infiltrate identified in LEFT lower lobe question pneumonia.  Minimal infiltrate versus atelectasis at RIGHT base.  Remaining lungs clear.  No gross pleural effusion or pneumothorax.  IMPRESSION: Enlargement of cardiac silhouette with minimal atelectasis or infiltrate at RIGHT base.  LEFT lower lobe consolidation consistent with pneumonia.   Electronically Signed   By: Ulyses Southward M.D.   On: 04/07/2015 21:38   Ct Abdomen Pelvis W Contrast  04/08/2015   CLINICAL DATA:  Fever and diarrhea for 2 days. White cell count 16. Red and white cells in urine. Abdominal pain.  EXAM: CT ABDOMEN AND PELVIS WITH CONTRAST  TECHNIQUE: Multidetector CT imaging of the abdomen and pelvis was performed using the standard protocol following bolus administration of intravenous contrast.  CONTRAST:  OMNIPAQUE IOHEXOL 300 MG/ML SOLN, 25mL OMNIPAQUE IOHEXOL 300 MG/ML SOLN  COMPARISON:  None.  FINDINGS: Small bilateral pleural effusions with basilar atelectasis or infiltration, greater on the left. Basilar pneumonia not excluded. Mild cardiac enlargement. Coronary artery calcifications.  Mild gallbladder wall thickening. No stones or infiltration demonstrated. No bile duct dilatation. Liver, spleen, pancreas, adrenal glands, abdominal aorta, inferior vena cava, and retroperitoneal lymph nodes are unremarkable. Nephrograms are asymmetrical with mild delayed of left nephrogram and mildly heterogeneous left nephrogram. There is mild left hydronephrosis and hydroureter with stranding around the left kidney and along the left ureter. No obstructing lesion or stone is definitively identified. However, there are multiple calcifications in the left renal pelvis which probably represent phleboliths. This may obscure identification of a small stone. Findings could represent obstruction due to occult stone or inflammatory stricture, or  pyelonephritis and ureteritis. Stomach, small bowel, and colon are mostly decompressed. Inflammatory stranding in the left pericolic gutter probably arises from the left kidney but a colonic inflammatory process is not excluded. Edema in the left flank musculature also may be inflammatory. No free air in the abdomen.  Pelvis: Bladder wall is thickened, probably due to under distention although infection could also have this appearance. Calcification in the left ovary probably dystrophic. Appendix is not definitively identified. No free or loculated pelvic fluid collections. No pelvic mass or lymphadenopathy. Degenerative changes throughout the lumbar spine. Loss of disc space at L4-5 without evidence of bone destruction is probably degenerative. Probable stenosis is of the lumbar spine at L3-4.  IMPRESSION: Asymmetric left renal nephrogram with mild left pyelocaliectasis and ureterectasis. No obstructing stones are demonstrated. Changes could be due to occult obstructing stone or lesion versus pyelonephritis and ureteritis. There is inflammatory stranding around the left kidney in ureter and in the left costophrenic angle region. Edema in the left flank subcutaneous fat. Bladder wall thickening could be due to infection  or under distention. Infiltration or atelectasis in both lung bases with small pleural effusions, greater on the left.   Electronically Signed   By: Burman Nieves M.D.   On: 04/08/2015 00:33   US Renal  04/08/2015   CLINICAL DATA:  Hydronephrosis seen on CT.  EXAM: RENAL / URINARY TRACT ULTRASOUND COMPLETE  COMPARISON:  CT abdomen pelvis dated 04/07/2015.  FINDINGS: Right Kidney:  Length: 15.3 cm. Cortical thickness and echogenicity are within normal limits without mass or cyst. No renal stone or hydronephrosis.  Left Kidney:  Length: 15 cm. Cortical thickness and echogenicity are within normal limits without mass or cyst. No renal stone seen. There is moderate hydronephrosis as identified on  earlier CT.  Bladder:  Appears normal for degree of bladder distention.  IMPRESSION: Moderate left hydronephrosis. Flow is shown from the distal left ureter into the bladder (left ureteral jet visualized). No complete ureteral obstruction.  As described on the earlier CT report, this left-sided hydronephrosis could be caused by partially obstructing ureteral wall thickening related to neoplastic, infectious, or inflammatory process.  Normal right kidney.  Bladder is unremarkable.   Electronically Signed   By: Bary Richard M.D.   On: 04/08/2015 11:14    Scheduled Meds: . aspirin EC  325 mg Oral Daily  . atorvastatin  20 mg Oral q1800  . carvedilol  3.125 mg Oral BID WC  . citalopram  20 mg Oral Daily  . divalproex  250 mg Oral q morning - 10a  . donepezil  10 mg Oral QHS  . enoxaparin (LOVENOX) injection  40 mg Subcutaneous Q24H  . insulin aspart  0-9 Units Subcutaneous TID WC  . insulin glargine  20 Units Subcutaneous Daily  . lisinopril  2.5 mg Oral Daily  . memantine  28 mg Oral Daily  . piperacillin-tazobactam (ZOSYN)  IV  3.375 g Intravenous 3 times per day  . vancomycin  750 mg Intravenous Q12H    Continuous Infusions: . sodium chloride       Time spent: 15 minutes  Hollice Espy  [facility] Pager [phone]. If 7PM-7AM, please contact night-coverage at [email or web address], password [facility] 04/08/2015, 2:50 PM  LOS: 0 days

## 2015-04-08 NOTE — ED Provider Notes (Signed)
CSN: [id number]     Arrival date & time 04/07/15  2010 History   First MD Initiated Contact with Patient 04/07/15 2010     Chief Complaint  Patient presents with  . Diarrhea  . Abdominal Pain     (Consider location/radiation/quality/duration/timing/severity/associated sxs/prior Treatment) HPI Comments: Patient brought to the emergency department from home. Patient reportedly has been expressing diarrhea since yesterday and family thinks she has had a fever at home. At arrival to the ER, patient has difficulty answering questions. She does have a history of dementia. She does report, however, that she has had a cough and chest congestion as well as abdominal pain. Level V Caveat due to dementia.   Patient is a 69 y.o. female presenting with diarrhea and abdominal pain.  Diarrhea Associated symptoms: abdominal pain   Abdominal Pain Associated symptoms: diarrhea     Past Medical History  Diagnosis Date  . ALLERGIC RHINITIS 02/08/2007  . ANXIETY 02/08/2007  . DEPRESSION 02/08/2007  . DIABETES MELLITUS, TYPE I 02/08/2007  . Headache(784.0) 11/06/2008  . HYPERCHOLESTEROLEMIA 11/05/2008  . HYPERTENSION 02/08/2007  . URINARY INCONTINENCE 02/08/2007  . Dyslipidemia   . Non-ulcer dyspepsia   . Leukopenia   . DM retinopathy     background  . 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 Dementia   . CHF (congestive heart failure)   . Anemia     takes iron  . Skin abnormality     both knees with opne ares and left shoulder with open areas, changes dressings prn   Past Surgical History  Procedure Laterality Date  . Total knee arthroplasty  1990    left  . Panendoscopy  10/27/1999  . Stress cardiolite  12/14/2003  . Electrocardiogram  05/17/2006  . Abdominal hysterectomy    . Bladder tach  years ago  . Colonoscopy with propofol N/A 06/21/2014    Procedure: COLONOSCOPY WITH PROPOFOL;  Surgeon: Rachael Fee, MD;  Location: [facility];  Service: Endoscopy;  Laterality: N/A;    Family History  Problem Relation Age of Onset  . Alcohol abuse Son   . Cancer Neg Hx     no cancer in immediate family  . CAD Father   . Colon cancer Neg Hx   . Colon polyps Neg Hx   . Diabetes Father   . Kidney disease Neg Hx   . Esophageal cancer Neg Hx   . Gallbladder disease Neg Hx    Social History  Substance Use Topics  . Smoking status: Never Smoker   . Smokeless tobacco: Never Used  . Alcohol Use: No   OB History    No data available     Review of Systems  Unable to perform ROS: Dementia  Gastrointestinal: Positive for abdominal pain and diarrhea.      Allergies  Aspirin and Codeine  Home Medications   Prior to Admission medications   Medication Sig Start Date End Date Taking? Authorizing Provider  aspirin EC 325 MG tablet TAKE 1 TABLET (325 MG TOTAL) BY MOUTH DAILY. 03/04/15  Yes Kathleene Hazel, MD  atorvastatin (LIPITOR) 20 MG tablet TAKE 1 TABLET BY MOUTH EVERY DAY 04/01/15  Yes Romero Belling, MD  carvedilol (COREG) 3.125 MG tablet Take 1 tablet (3.125 mg total) by mouth 2 (two) times daily with a meal. 07/17/14  Yes Kathleene Hazel, MD  citalopram (CELEXA) 20 MG tablet TAKE 1 TABLET (20 MG TOTAL) BY MOUTH DAILY. 10/29/14  Yes Romero Belling, MD  divalproex (DEPAKOTE ER) 250 MG 24 hr tablet Take 250 mg by mouth every morning.  07/09/14  Yes Historical Provider, MD  donepezil (ARICEPT) 10 MG tablet Take 10 mg by mouth at bedtime.   Yes Historical Provider, MD  furosemide (LASIX) 40 MG tablet Take 40 mg by mouth daily.    Yes Historical Provider, MD  hyoscyamine (LEVSIN SL) 0.125 MG SL tablet PLACE 1 TABLET UNDER THE TONGUE TWICE DAILY AS NEEDED FOR CRAMPS AND SPASMS. 04/01/15  Yes Lori P Hvozdovic, PA-C  Insulin Lispro Prot & Lispro (HUMALOG MIX 50/50 KWIKPEN) (50-50) 100 UNIT/ML Kwikpen Inject 25 Units into the skin daily with breakfast. 03/20/15  Yes Romero Belling, MD  lisinopril (PRINIVIL,ZESTRIL) 2.5 MG tablet TAKE 1 TABLET (2.5 MG TOTAL) BY MOUTH  EVERY MORNING. 03/11/15  Yes Kathleene Hazel, MD  memantine [facility] TITRATION PACK) tablet pack Take 7-28 mg by mouth at bedtime. Take 1 tablet (7 mg) for Days 1-7, Take 1 tablet (14 mg) for Days 8-14, Take 1 tablet (21 mg) for Days 15-21, Take 1 tablet (28 mg) Days 22-28.   Yes Historical Provider, MD  memantine (NAMENDA XR) 28 MG CP24 24 hr capsule Take 28 mg by mouth daily.   Yes Historical Provider, MD  Propylene Glycol (SYSTANE BALANCE OP) Apply 1 drop to eye 3 (three) times daily as needed (dry eyes).   Yes Historical Provider, MD  sodium chloride (OCEAN) 0.65 % SOLN nasal spray Place 2 sprays into both nostrils as needed for congestion.   Yes Historical Provider, MD   BP 112/60 mmHg  Pulse 76  Temp(Src) 100.8 F (38.2 C) (Oral)  Resp 16  Ht  (1.6 m)  Wt 175 lb (79.379 kg)  BMI 31.01 kg/m2  SpO2 98% Physical Exam  Constitutional: She is oriented to person, place, and time. She appears well-developed and well-nourished. No distress.  HENT:  Head: Normocephalic and atraumatic.  Right Ear: Hearing normal.  Left Ear: Hearing normal.  Nose: Nose normal.  Mouth/Throat: Oropharynx is clear and moist and mucous membranes are normal.  Eyes: Conjunctivae and EOM are normal. Pupils are equal, round, and reactive to light.  Neck: Normal range of motion. Neck supple.  Cardiovascular: Regular rhythm, S1 normal and S2 normal.  Exam reveals no gallop and no friction rub.   No murmur heard. Pulmonary/Chest: Effort normal and breath sounds normal. No respiratory distress. She exhibits no tenderness.  Abdominal: Soft. Normal appearance and bowel sounds are normal. There is no hepatosplenomegaly. There is generalized tenderness. There is no rebound, no guarding, no tenderness at McBurney's point and negative Murphy's sign. No hernia.  Musculoskeletal: Normal range of motion.  Neurological: She is alert and oriented to person, place, and time. She has normal strength. No cranial nerve  deficit or sensory deficit. Coordination normal. GCS eye subscore is 4. GCS verbal subscore is 5. GCS motor subscore is 6.  Skin: Skin is warm, dry and intact. No rash noted. No cyanosis.  Psychiatric: She has a normal mood and affect. Her speech is normal and behavior is normal. Thought content normal.  Nursing note and vitals reviewed.   ED Course  Procedures (including critical care time) Labs Review Labs Reviewed  CBC WITH DIFFERENTIAL/PLATELET - Abnormal; Notable for the following:    WBC 16.0 (*)    RBC 3.63 (*)    Hemoglobin 9.8 (*)    HCT 29.6 (*)    RDW 15.8 (*)    Neutro Abs 12.9 (*)    Monocytes  Absolute 1.8 (*)    All other components within normal limits  COMPREHENSIVE METABOLIC PANEL - Abnormal; Notable for the following:    Chloride 97 (*)    Glucose, Bld 331 (*)    Calcium 7.8 (*)    Total Protein 6.3 (*)    Albumin 2.3 (*)    All other components within normal limits  URINALYSIS, ROUTINE W REFLEX MICROSCOPIC (NOT AT [facility]) - Abnormal; Notable for the following:    Color, Urine AMBER (*)    APPearance TURBID (*)    Glucose, UA 100 (*)    Hgb urine dipstick LARGE (*)    Bilirubin Urine SMALL (*)    Protein, ur 100 (*)    Nitrite POSITIVE (*)    Leukocytes, UA LARGE (*)    All other components within normal limits  URINE MICROSCOPIC-ADD ON - Abnormal; Notable for the following:    Bacteria, UA MANY (*)    All other components within normal limits  I-STAT CHEM 8, ED - Abnormal; Notable for the following:    Chloride 96 (*)    BUN 22 (*)    Glucose, Bld 336 (*)    Calcium, Ion 1.00 (*)    Hemoglobin 10.5 (*)    HCT 31.0 (*)    All other components within normal limits  URINE CULTURE  CULTURE, BLOOD (ROUTINE X 2)  CULTURE, BLOOD (ROUTINE X 2)  LIPASE, BLOOD  I-STAT CG4 LACTIC ACID, ED    Imaging Review Dg Chest 2 View  04/07/2015   CLINICAL DATA:  Diarrhea beginning yesterday, fever, shortness of breath, CHF, hypertension, type I diabetes mellitus   EXAM: CHEST  2 VIEW  COMPARISON:  02/26/2015  FINDINGS: Enlargement of cardiac silhouette.  Atherosclerotic calcification aorta.  Normal mediastinal contours.  Infiltrate identified in LEFT lower lobe question pneumonia.  Minimal infiltrate versus atelectasis at RIGHT base.  Remaining lungs clear.  No gross pleural effusion or pneumothorax.  IMPRESSION: Enlargement of cardiac silhouette with minimal atelectasis or infiltrate at RIGHT base.  LEFT lower lobe consolidation consistent with pneumonia.   Electronically Signed   By: Ulyses Southward M.D.   On: 04/07/2015 21:38   I have personally reviewed and evaluated these images and lab results as part of my medical decision-making.   EKG Interpretation   Date/Time:  Sunday April 07 2015 20:23:42 EDT Ventricular Rate:  94 PR Interval:  184 QRS Duration: 144 QT Interval:  432 QTC Calculation: 540 R Axis:   -43 Text Interpretation:  Sinus rhythm Left bundle branch block No significant  change since last tracing Confirmed by POLLINA  MD, CHRISTOPHER [phone]) on  04/07/2015 9:27:20 PM      MDM   Final diagnoses:  HCAP (healthcare-associated pneumonia)  UTI (lower urinary tract infection)  Acute encephalopathy    Presents to the ER for evaluation of more confusion than usual. Family was concerned about possibility of fever and she did in fact have a fever here in the ER. She complaining of abdominal pain. She had generalized tenderness without focality. Workup reveals leukocytosis, pneumonia, urinary tract infection. Patient continues to have abdominal discomfort, CT scan performed to rule out significant intra-abdominal process. Patient will require hospitalization for further management of healthcare associated pneumonia (she was in nursing home last month) and UTI. She does not appear to be septic at this time.    Gilda Crease, MD 04/08/15 [phone]

## 2015-04-08 NOTE — Progress Notes (Signed)
CRITICAL VALUE ALERT  Critical value received:  Gram Neg Rods, anaerobic bottle  Date of notification:  04/08/2015  Time of notification: 1248  Critical value read back:YES  Nurse who received alert:  Lady Gary  MD notified (1st page):  Dr. Chancy Milroy  Time of first page:  1249  MD notified (2nd page):  Time of second page:  Responding MD:  Dr. Rito Ehrlich  Time MD responded:  [id number]

## 2015-04-08 NOTE — ED Notes (Signed)
Patient transported to CT 

## 2015-04-09 DIAGNOSIS — I5042 Chronic combined systolic (congestive) and diastolic (congestive) heart failure: Secondary | ICD-10-CM | POA: Diagnosis present

## 2015-04-09 LAB — BASIC METABOLIC PANEL
ANION GAP: 11 (ref 5–15)
BUN: 19 mg/dL (ref 6–20)
CALCIUM: 8 mg/dL — AB (ref 8.9–10.3)
CHLORIDE: 98 mmol/L — AB (ref 101–111)
CO2: 29 mmol/L (ref 22–32)
Creatinine, Ser: 0.88 mg/dL (ref 0.44–1.00)
GFR calc non Af Amer: >60 mL/min (ref >60)
Glucose, Bld: 168 mg/dL — ABNORMAL HIGH (ref 65–99)
Potassium: 2.9 mmol/L — ABNORMAL LOW (ref 3.5–5.1)
Sodium: 138 mmol/L (ref 135–145)

## 2015-04-09 LAB — CBC
HCT: 29.1 % — ABNORMAL LOW (ref 36.0–46.0)
HEMOGLOBIN: 9.7 g/dL — AB (ref 12.0–15.0)
MCH: 27 pg (ref 26.0–34.0)
MCHC: 33.3 g/dL (ref 30.0–36.0)
MCV: 81.1 fL (ref 78.0–100.0)
Platelets: 266 10*3/uL (ref 150–400)
RBC: 3.59 MIL/uL — AB (ref 3.87–5.11)
RDW: 15.8 % — ABNORMAL HIGH (ref 11.5–15.5)
WBC: 18.2 10*3/uL — ABNORMAL HIGH (ref 4.0–10.5)

## 2015-04-09 LAB — C DIFFICILE QUICK SCREEN W PCR REFLEX
C Diff antigen: NEGATIVE
C Diff interpretation: NEGATIVE
C Diff toxin: NEGATIVE

## 2015-04-09 LAB — GLUCOSE, CAPILLARY
GLUCOSE-CAPILLARY: 209 mg/dL — AB (ref 65–99)
GLUCOSE-CAPILLARY: 231 mg/dL — AB (ref 65–99)
Glucose-Capillary: 221 mg/dL — ABNORMAL HIGH (ref 65–99)
Glucose-Capillary: 313 mg/dL — ABNORMAL HIGH (ref 65–99)

## 2015-04-09 LAB — BRAIN NATRIURETIC PEPTIDE: B NATRIURETIC PEPTIDE 5: 138.1 pg/mL — AB (ref 0.0–100.0)

## 2015-04-09 MED ORDER — BOOST / RESOURCE BREEZE PO LIQD
1.0000 | Freq: Two times a day (BID) | ORAL | Status: DC
  Administered 2015-04-10 – 2015-04-11 (×3): 1 via ORAL

## 2015-04-09 MED ORDER — LOPERAMIDE HCL 2 MG PO CAPS: 2.0000 mg | ORAL_CAPSULE | ORAL | Status: DC | PRN

## 2015-04-09 NOTE — Evaluation (Signed)
Clinical/Bedside Swallow Evaluation Patient Details  Name: Dana [name] MRN: [id number] Date of Birth: [birth year]  Today's Date: 04/09/2015 Time: SLP Start Time (ACUTE ONLY): 1505 SLP Stop Time (ACUTE ONLY): 1545 SLP Time Calculation (min) (ACUTE ONLY): 40 min  Past Medical History:  Past Medical History  Diagnosis Date  . ALLERGIC RHINITIS 02/08/2007  . ANXIETY 02/08/2007  . DEPRESSION 02/08/2007  . DIABETES MELLITUS, TYPE I 02/08/2007  . Headache(784.0) 11/06/2008  . HYPERCHOLESTEROLEMIA 11/05/2008  . HYPERTENSION 02/08/2007  . URINARY INCONTINENCE 02/08/2007  . Dyslipidemia   . Non-ulcer dyspepsia   . Leukopenia   . DM retinopathy     background  . 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 Dementia   . CHF (congestive heart failure)   . Anemia     takes iron  . Skin abnormality     both knees with opne ares and left shoulder with open areas, changes dressings prn   Past Surgical History:  Past Surgical History  Procedure Laterality Date  . Total knee arthroplasty  1990    left  . Panendoscopy  10/27/1999  . Stress cardiolite  12/14/2003  . Electrocardiogram  05/17/2006  . Abdominal hysterectomy    . Bladder tach  years ago  . Colonoscopy with propofol N/A 06/21/2014    Procedure: COLONOSCOPY WITH PROPOFOL;  Surgeon: Rachael Fee, MD;  Location: [facility];  Service: Endoscopy;  Laterality: N/A;   HPI:  69 yo female adm to [facility] with cough, AMS.  Pt has h/o Alzheimer's dementia, DM, CHF, diagnosed with HCAP.  Pt CXR showed left lower lobe consolidation c/w pna 04/07/2015.  Spouse present and reports pt without problems swallowing, coughing some but not associated with intake.   Pt with h/o dyspepsia.    Assessment / Plan / Recommendation Clinical Impression  Pt presents with functional oropharyngeal swallow based on clinical swallow evaluation.  Swallow was swift without residuals or indication of airway compromise.  She did benefit from assistance to self feed  (most notably with spoon using right hand- *hand over hand ).  Spouse present and reports pt will pocket food on right side of cheek and masticate pills at home.  Also noted pt leaning to the right in the bed, despite repositioning.  Note CN deficits present including facial nerve and possible vagal/glossopharyngeal nerve.   SLP provided spouse with compensation strategies for pt's mastication of pills and oral residuals.  Of note, pt is often fed in bed as spouse reports she does not ambulate any more.  Recommend consider hospital bed for optimal positioning with intake.  SLP to follow up x1 to assure spouse understands all education SLP provided him.      Aspiration Risk  Mild    Diet Recommendation Age appropriate regular solids;Thin   Medication Administration: Whole meds with puree (crush if large and not contraindicated) Compensations: Slow rate;Small sips/bites;Check for pocketing    Other  Recommendations Oral Care Recommendations: Oral care QID   Follow Up Recommendations    n/a   Frequency and Duration min 1 x/week  1 week   Pertinent Vitals/Pain Afebrile,d ecreased      Swallow Study Prior Functional Status   see hhx  General Date of Onset: 04/09/15 Other Pertinent Information: 69 yo female adm to [facility] with cough, AMS.  Pt has h/o Alzheimer's dementia, DM, CHF, diagnosed with HCAP.  Pt CXR showed left lower lobe consolidation c/w pna 04/07/2015.  Spouse present and reports pt without problems  swallowing, coughing some but not associated with intake.   Type of Study: Bedside swallow evaluation Diet Prior to this Study: Regular;Thin liquids Temperature Spikes Noted: No Respiratory Status: Supplemental O2 delivered via (comment) History of Recent Intubation: No Behavior/Cognition: Alert;Cooperative;Other (Comment) (pt has dementia, decreased ability to follow directions) Oral Cavity - Dentition: Adequate natural dentition/normal for age Self-Feeding Abilities: Needs  assist Patient Positioning: Upright in bed Baseline Vocal Quality: Normal Volitional Cough: Strong Volitional Swallow: Unable to elicit    Oral/Motor/Sensory Function Overall Oral Motor/Sensory Function: Impaired at baseline Labial ROM: Reduced right Labial Strength: Reduced Lingual Strength: Reduced Facial ROM: Reduced right Facial Symmetry: Right droop Facial Strength: Reduced Velum:  (deviates left upon phonation)   Ice Chips Ice chips: Not tested   Thin Liquid Thin Liquid: Within functional limits Presentation: Straw;Cup;Self Fed    Nectar Thick Nectar Thick Liquid: Not tested   Honey Thick Honey Thick Liquid: Not tested   Puree Puree: Within functional limits Presentation: Spoon Other Comments: self fed with assistance   Solid   GO    Solid: Within functional limits Presentation: [address]       Mickie Bail Murphy, [place] [facility] SLP [phone]

## 2015-04-09 NOTE — Progress Notes (Signed)
Initial Nutrition Assessment  DOCUMENTATION CODES:   Obesity unspecified  INTERVENTION:  - Will order Boost Breeze BID, each supplement provides 250 kcal and 9 grams of protein - Encourage PO intakes and adequate hydration - RD will continue to monitor for needs  NUTRITION DIAGNOSIS:   Inadequate oral intake related to poor appetite as evidenced by per patient/family report.  GOAL:   Patient will meet greater than or equal to 90% of their needs  MONITOR:   PO intake, Supplement acceptance, Weight trends, Labs, I & O's  REASON FOR ASSESSMENT:   Low Braden  ASSESSMENT:   69 y.o. female with history of dementia, diabetes mellitus type 2, chronic systolic heart failure was brought to the ER after patient was found to have increasing abdominal discomfort and diarrhea over the last 3-4 days. Patient also has been some nausea and poor appetite. UA shows features consistent with UTI. CT abdomen and pelvis done shows features concerning for left-sided pyelonephritis. On exam patient is not in distress and is a poor historian secondary to known history of dementia. As per patient's husband patient did not complain of any shortness of breath productive cough or have any chest pain. X-rays do reveal concerning features for pneumonia. Patient was admitted last month for hypoglycemia and was discharged to rehabilitation.  Pt seen for low Braden. BMI indicates obesity. Per chart review, pt ate 50% of lunch yesterday and 10% of breakfast this AM (stated she was not hungry at breakfast).  Husband was at bedside at time of visit and pt sleeping; physical assessment not done at this time due to allowing pt to rest. All information was provided by husband. He fed pt 75-100% of lunch today and states she ate this without problem.  PTA, pt had been complaining of nausea with no associated vomiting x2-3 weeks PTA. During this time her appetite was decreased and she ate less than usual. Prior to this  episode, pt had a very good appetite. She does not have chewing or swallowing issues with any items, per husband.   At home pt drank Diet Coke and ginger ale, white grape juice, and occasionally water. She did not drink nutrition supplements. Will order Boost Breeze BID to supplement as pt may tolerate clear liquid supplement better at this time with improving nausea; will monitor for ability to switch to Glucerna Shake.   Husband reports UBW of 170-175 lbs; consistent with current weight.  Not fully meeting needs at this time. Medications reviewed. Labs reviewed; CBGs: 174-283 mg/dL, K: 2.9 mmol/L, Cl: 98 mmol/L, Ca: 8 mg/dL.   Diet Order:  Diet heart healthy/carb modified Room service appropriate?: Yes; Fluid consistency:: Thin; Fluid restriction:: 1200 mL Fluid  Skin:  Reviewed, no issues  Last BM:  9/20  Height:   Ht Readings from Last 1 Encounters:  04/08/15  (1.6 m)    Weight:   Wt Readings from Last 1 Encounters:  04/09/15 170 lb 6.7 oz (77.3 kg)    Ideal Body Weight:  52.27 kg (kg)  BMI:  Body mass index is 30.2 kg/(m^2).  Estimated Nutritional Needs:   Kcal:  1400-1600  Protein:  60-70 grams  Fluid:  2-2.2 L/day  EDUCATION NEEDS:   No education needs identified at this time     Trenton Gammon, RD, LDN Inpatient Clinical Dietitian Pager # [phone] After hours/weekend pager # [phone]

## 2015-04-09 NOTE — Progress Notes (Addendum)
PROGRESS NOTE  Dana [name] ZOX:[id number] DOB: September 02, 1945 DOA: 04/07/2015 PCP: Romero Belling, MD  HPI/Recap of past 70 hours: 69 year old female with past medical history of Alzheimer's dementia, diabetes mellitus with secondary blindness and CHF admitted on 9/19 for pyelonephritis as well as healthcare associated pneumonia after coming in complaining of bowel discomfort and CT noting pyelonephritis.  Today, patient more somnolent. Overnight no events although white blood cell count increased. Continues to have episodes of diarrhea, although C. difficile negative  Assessment/Plan: Principal Problem:   HCAP (healthcare-associated pneumonia): Started on broad-spectrum antibiotics. Have consulted speech therapy to check swallow evaluation. Also aspiration especially in the setting of increasing white blood cell count Active Problems:   Essential hypertension: Blood pressure stable   Diabetes mellitus type 2, uncontrolled: CBGs still slightly above 200, likely in the setting of acute infection. A1c done last month at 7.4   Alzheimer's disease:   Acute pyelonephritis: Continue antibiotics   Chronic systolic/diastolic CHF (congestive heart failure) 40-45% 8/16.: Looks to be euvolemic. Checking BNP.    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 difficile negative. Core no husband, patient has been struggling with this for the past year   Code Status: Full code  Family Communication: Spoke with husband at the bedside  Disposition Plan: Back to nursing facility once white count normalized and confirmed no signs of aspiration   Consultants:  None  Procedures:  None  Antibiotics:  IV Zosyn 9/19-present  IV vancomycin 9/19-present   Objective: BP 140/57 mmHg  Pulse 73  Temp(Src) 98.2 F (36.8 C) (Oral)  Resp 28  Ht  (1.6 m)  Wt 77.3 kg (170 lb 6.7 oz)  BMI 30.20 kg/m2  SpO2 96%  Intake/Output Summary (Last 24 hours) at  04/09/15 1519 Last data filed at 04/09/15 1401  Gross per 24 hour  Intake    620 ml  Output      7 ml  Net    613 ml   Filed Weights   04/07/15 2026 04/08/15 0158 04/09/15 0501  Weight: 79.379 kg (175 lb) 79.3 kg (174 lb 13.2 oz) 77.3 kg (170 lb 6.7 oz)    Exam:   General:  More somnolent  Cardiovascular: Regular rate and rhythm, S1-S2, 2/6 systolic ejection murmur  Respiratory: Decreased breath sounds bibasilar  Abdomen: Soft, obese, nontender, hypoactive bowel sounds  Musculoskeletal: No clubbing or cyanosis, trace edema   Data Reviewed: Basic Metabolic Panel:  Recent Labs Lab 04/07/15 2026 04/07/15 2203 04/09/15 0515  NA 135 135 138  K 3.7 3.7 2.9*  CL 97* 96* 98*  CO2 28  --  29  GLUCOSE 331* 336* 168*  BUN 18 22* 19  CREATININE 0.88 0.90 0.88  CALCIUM 7.8*  --  8.0*   Liver Function Tests:  Recent Labs Lab 04/07/15 2026  AST 26  ALT 17  ALKPHOS 94  BILITOT 1.1  PROT 6.3*  ALBUMIN 2.3*    Recent Labs Lab 04/07/15 2026  LIPASE 26   No results for input(s): AMMONIA in the last 168 hours. CBC:  Recent Labs Lab 04/07/15 2026 04/07/15 2203 04/09/15 0515  WBC 16.0*  --  18.2*  NEUTROABS 12.9*  --   --   HGB 9.8* 10.5* 9.7*  HCT 29.6* 31.0* 29.1*  MCV 81.5  --  81.1  PLT 257  --  266   Cardiac Enzymes:   No results for input(s): CKTOTAL, CKMB, CKMBINDEX, TROPONINI in the last 168 hours. BNP (  last 3 results) No results for input(s): BNP in the last 8760 hours.  ProBNP (last 3 results)  Recent Labs  06/18/14 1553  PROBNP 148.0*    CBG:  Recent Labs Lab 04/08/15 1136 04/08/15 1700 04/08/15 2231 04/09/15 0801 04/09/15 1211  GLUCAP 234* 178* 174* 209* 221*    Recent Results (from the past 240 hour(s))  Culture, blood (routine x 2)     Status: None (Preliminary result)   Collection Time: 04/07/15  7:57 PM  Result Value Ref Range Status   Specimen Description BLOOD RIGHT ARM  Final   Special Requests BOTTLES DRAWN AEROBIC  AND ANAEROBIC  Final   Culture  Setup Time   Final    GRAM NEGATIVE RODS IN BOTH AEROBIC AND ANAEROBIC BOTTLES CRITICAL RESULT CALLED TO, READ BACK BY AND VERIFIED WITH: Q HOLLAWAY 04/08/15 @ 1245 M VESTAL    Culture   Final    ESCHERICHIA COLI Performed at [facility]    Report Status PENDING  Incomplete  Culture, blood (routine x 2)     Status: None (Preliminary result)   Collection Time: 04/07/15  8:26 PM  Result Value Ref Range Status   Specimen Description BLOOD RIGHT ANTECUBITAL  Final   Special Requests BOTTLES DRAWN AEROBIC AND ANAEROBIC  Final   Culture  Setup Time AEROBIC BOTTLE ONLY  Final   Culture PENDING  Incomplete   Report Status PENDING  Incomplete  Urine culture     Status: None (Preliminary result)   Collection Time: 04/07/15  9:38 PM  Result Value Ref Range Status   Specimen Description URINE, CLEAN CATCH  Final   Special Requests NONE  Final   Culture   Final    >=100,000 COLONIES/mL ESCHERICHIA COLI Performed at [facility]    Report Status PENDING  Incomplete  C difficile quick scan w PCR reflex     Status: None   Collection Time: 04/09/15  5:00 AM  Result Value Ref Range Status   C Diff antigen NEGATIVE NEGATIVE Final   C Diff toxin NEGATIVE NEGATIVE Final   C Diff interpretation Negative for toxigenic C. difficile  Final     Studies: No results found.  Scheduled Meds: . aspirin EC  325 mg Oral Daily  . atorvastatin  20 mg Oral q1800  . carvedilol  3.125 mg Oral BID WC  . citalopram  20 mg Oral Daily  . divalproex  250 mg Oral q morning - 10a  . donepezil  10 mg Oral QHS  . enoxaparin (LOVENOX) injection  40 mg Subcutaneous Q24H  . feeding supplement  1 Container Oral BID BM  . insulin aspart  0-9 Units Subcutaneous TID WC  . insulin glargine  20 Units Subcutaneous Daily  . lisinopril  2.5 mg Oral Daily  . memantine  28 mg Oral Daily  . piperacillin-tazobactam (ZOSYN)  IV  3.375 g Intravenous 3 times per day  .  vancomycin  750 mg Intravenous Q12H    Continuous Infusions:     Time spent: 25 minutes  Hollice Espy  [facility] Pager [phone]. If 7PM-7AM, please contact night-coverage at [email or web address], password [facility] 04/09/2015, 3:19 PM  LOS: 1 day

## 2015-04-10 DIAGNOSIS — I5042 Chronic combined systolic (congestive) and diastolic (congestive) heart failure: Secondary | ICD-10-CM

## 2015-04-10 LAB — CBC
HEMATOCRIT: 27.8 % — AB (ref 36.0–46.0)
HEMOGLOBIN: 9.4 g/dL — AB (ref 12.0–15.0)
MCH: 27.1 pg (ref 26.0–34.0)
MCHC: 33.8 g/dL (ref 30.0–36.0)
MCV: 80.1 fL (ref 78.0–100.0)
Platelets: 273 10*3/uL (ref 150–400)
RBC: 3.47 MIL/uL — AB (ref 3.87–5.11)
RDW: 16 % — ABNORMAL HIGH (ref 11.5–15.5)
WBC: 13.9 10*3/uL — ABNORMAL HIGH (ref 4.0–10.5)

## 2015-04-10 LAB — BASIC METABOLIC PANEL
Anion gap: 8 (ref 5–15)
BUN: 19 mg/dL (ref 6–20)
CHLORIDE: 96 mmol/L — AB (ref 101–111)
CO2: 30 mmol/L (ref 22–32)
CREATININE: 0.88 mg/dL (ref 0.44–1.00)
Calcium: 7.9 mg/dL — ABNORMAL LOW (ref 8.9–10.3)
GFR calc Af Amer: >60 mL/min (ref >60)
GFR calc non Af Amer: >60 mL/min (ref >60)
Glucose, Bld: 269 mg/dL — ABNORMAL HIGH (ref 65–99)
POTASSIUM: 3.2 mmol/L — AB (ref 3.5–5.1)
SODIUM: 134 mmol/L — AB (ref 135–145)

## 2015-04-10 LAB — URINE CULTURE: Culture: >=100000

## 2015-04-10 LAB — CULTURE, BLOOD (ROUTINE X 2)

## 2015-04-10 LAB — GLUCOSE, CAPILLARY
GLUCOSE-CAPILLARY: 280 mg/dL — AB (ref 65–99)
GLUCOSE-CAPILLARY: 269 mg/dL — AB (ref 65–99)
GLUCOSE-CAPILLARY: 284 mg/dL — AB (ref 65–99)
Glucose-Capillary: 319 mg/dL — ABNORMAL HIGH (ref 65–99)

## 2015-04-10 MED ORDER — INSULIN GLARGINE 100 UNIT/ML ~~LOC~~ SOLN
25.0000 [IU] | Freq: Every day | SUBCUTANEOUS | Status: DC
  Administered 2015-04-11: 25 [IU] via SUBCUTANEOUS
  Filled 2015-04-10: qty 0.25

## 2015-04-10 NOTE — Progress Notes (Signed)
Inpatient Diabetes Program Recommendations  AACE/ADA: New Consensus Statement on Inpatient Glycemic Control (2015)  Target Ranges:  Prepandial:   less than 140 mg/dL      Peak postprandial:   less than 180 mg/dL (1-2 hours)      Critically ill patients:  140 - 180 mg/dL   Review of Glycemic Control  CBGs 269, 280 this am.  Inpatient Diabetes Program Recommendations:  Insulin - Basal: Increase Lantus to 25 units    Will continue to follow. Thank you. Ailene Ards, RD, LDN, CDE Inpatient Diabetes Coordinator [phone]

## 2015-04-10 NOTE — Care Management Note (Signed)
Case Management Note  Patient Details  Name: Dana [name] MRN: [id number] Date of Birth: 1946-04-11  Subjective/Objective:           69 yo admitted with HCAP        Action/Plan: From home with husband  Expected Discharge Date:                  Expected Discharge Plan:  Home w Home Health Services  In-House Referral:     Discharge planning Services  CM Consult  Post Acute Care Choice:  Resumption of Svcs/PTA Provider Choice offered to:  Spouse  DME Arranged:  Hospital bed DME Agency:  [facility]  HH Arranged:  RN, PT, Nurse's Aide [facility] Agency:  [facility]  Status of Service:  In process, will continue to follow  Medicare Important Message Given:  Yes-second notification given Date Medicare IM Given:    Medicare IM give by:    Date Additional Medicare IM Given:    Additional Medicare Important Message give by:     If discussed at Long Length of Stay Meetings, dates discussed:    Additional Comments: Pt is currently active with [facility]. Pt will need resumption orders at DC. Pt's husband inquired about hospital bed at home. This CM spoke with [facility] DME rep who states pt does have a qualifying diagnosis for a hospital bed. Will need MD order for hospital bed as well at DC. CM will continue to follow. Bartholome Bill, RN 04/10/2015, 4:05 PM

## 2015-04-10 NOTE — Progress Notes (Signed)
PROGRESS NOTE  Dana [name] ZOX:[id number] DOB: 1945-10-13 DOA: 04/07/2015 PCP: Romero Belling, MD  HPI/Recap of past 72 hours: 69 year old female with past medical history of Alzheimer's dementia, diabetes mellitus with secondary blindness and CHF admitted on 9/19 for pyelonephritis as well as healthcare associated pneumonia after coming in complaining of bowel discomfort and CT noting pyelonephritis.  She's also been having episodes of diarrhea, which is been more chronic in nature. C. difficile cultures negative. After several days, patient responded to antibiotics and now white count down to 13. Seen by speech therapy and recommend sitting her up, but otherwise no dietary restrictions.  Assessment/Plan: Principal Problem:   HCAP (healthcare-associated pneumonia): Started on broad-spectrum antibiotics. Passed swallow evaluation Active Problems:   Essential hypertension: Blood pressure stable   Diabetes mellitus type 2, uncontrolled: CBGs still slightly above 200, likely in the setting of acute infection. A1c done last month at 7.4.  Increased Lantus to 25 units   Alzheimer's disease: Stable   Acute pyelonephritis: Continue antibiotics   Chronic systolic/diastolic CHF (congestive heart failure) 40-45% 8/16.: Looks to be euvolemic. 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 difficile negative. According to her husband, patient has been struggling with this for the past year. When necessary Imodium   Code Status: Full code  Family Communication: Spoke with husband at the bedside yesterday  Disposition Plan: Anticipate return to skilled nursing tomorrow   Consultants:  None  Procedures:  None  Antibiotics:  IV Zosyn 9/19-present  IV vancomycin 9/19-present   Objective: BP 138/75 mmHg  Pulse 87  Temp(Src) 98.2 F (36.8 C) (Oral)  Resp 22  Ht  (1.6 m)  Wt 77.3 kg (170 lb 6.7 oz)  BMI 30.20 kg/m2   SpO2 96%  Intake/Output Summary (Last 24 hours) at 04/10/15 1340 Last data filed at 04/10/15 0554  Gross per 24 hour  Intake    250 ml  Output      2 ml  Net    248 ml   Filed Weights   04/07/15 2026 04/08/15 0158 04/09/15 0501  Weight: 79.379 kg (175 lb) 79.3 kg (174 lb 13.2 oz) 77.3 kg (170 lb 6.7 oz)    Exam:   General:  Fatigued, oriented 1  Cardiovascular: Regular rate and rhythm, S1-S2, 2/6 systolic ejection murmur  Respiratory: Decreased breath sounds bibasilar  Abdomen: Soft, obese, nontender, hypoactive bowel sounds  Musculoskeletal: No clubbing or cyanosis, trace edema   Data Reviewed: Basic Metabolic Panel:  Recent Labs Lab 04/07/15 2026 04/07/15 2203 04/09/15 0515 04/10/15 0530  NA 135 135 138 134*  K 3.7 3.7 2.9* 3.2*  CL 97* 96* 98* 96*  CO2 28  --  29 30  GLUCOSE 331* 336* 168* 269*  BUN 18 22* 19 19  CREATININE 0.88 0.90 0.88 0.88  CALCIUM 7.8*  --  8.0* 7.9*   Liver Function Tests:  Recent Labs Lab 04/07/15 2026  AST 26  ALT 17  ALKPHOS 94  BILITOT 1.1  PROT 6.3*  ALBUMIN 2.3*    Recent Labs Lab 04/07/15 2026  LIPASE 26   No results for input(s): AMMONIA in the last 168 hours. CBC:  Recent Labs Lab 04/07/15 2026 04/07/15 2203 04/09/15 0515 04/10/15 0530  WBC 16.0*  --  18.2* 13.9*  NEUTROABS 12.9*  --   --   --   HGB 9.8* 10.5* 9.7* 9.4*  HCT 29.6* 31.0* 29.1* 27.8*  MCV 81.5  --  81.1 80.1  PLT 257  --  266 273   Cardiac Enzymes:   No results for input(s): CKTOTAL, CKMB, CKMBINDEX, TROPONINI in the last 168 hours. BNP (last 3 results)  Recent Labs  04/09/15 1500  BNP 138.1*    ProBNP (last 3 results)  Recent Labs  06/18/14 1553  PROBNP 148.0*    CBG:  Recent Labs Lab 04/09/15 1211 04/09/15 1655 04/09/15 2220 04/10/15 0750 04/10/15 1216  GLUCAP 221* 231* 313* 280* 269*    Recent Results (from the past 240 hour(s))  Culture, blood (routine x 2)     Status: None   Collection Time: 04/07/15   7:57 PM  Result Value Ref Range Status   Specimen Description BLOOD RIGHT ARM  Final   Special Requests BOTTLES DRAWN AEROBIC AND ANAEROBIC  Final   Culture  Setup Time   Final    GRAM NEGATIVE RODS IN BOTH AEROBIC AND ANAEROBIC BOTTLES CRITICAL RESULT CALLED TO, READ BACK BY AND VERIFIED WITH: Q HOLLAWAY 04/08/15 @ 1245 M VESTAL    Culture   Final    ESCHERICHIA COLI Performed at [facility]    Report Status 04/10/2015 FINAL  Final   Organism ID, Bacteria ESCHERICHIA COLI  Final      Susceptibility   Escherichia coli - MIC*    AMPICILLIN 4 SENSITIVE Sensitive     CEFAZOLIN <=4 SENSITIVE Sensitive     CEFEPIME <=1 SENSITIVE Sensitive     CEFTAZIDIME <=1 SENSITIVE Sensitive     CEFTRIAXONE <=1 SENSITIVE Sensitive     CIPROFLOXACIN <=0.25 SENSITIVE Sensitive     GENTAMICIN <=1 SENSITIVE Sensitive     IMIPENEM <=0.25 SENSITIVE Sensitive     TRIMETH/SULFA <=20 SENSITIVE Sensitive     AMPICILLIN/SULBACTAM 4 SENSITIVE Sensitive     PIP/TAZO <=4 SENSITIVE Sensitive     * ESCHERICHIA COLI  Culture, blood (routine x 2)     Status: None   Collection Time: 04/07/15  8:26 PM  Result Value Ref Range Status   Specimen Description BLOOD RIGHT ANTECUBITAL  Final   Special Requests BOTTLES DRAWN AEROBIC AND ANAEROBIC  Final   Culture  Setup Time   Final    GRAM NEGATIVE RODS AEROBIC BOTTLE ONLY CRITICAL RESULT CALLED TO, READ BACK BY AND VERIFIED WITH: Meyer Cory RN 11:30 04/09/15 ([email or web address]    Culture   Final    ESCHERICHIA COLI SUSCEPTIBILITIES PERFORMED ON PREVIOUS CULTURE WITHIN THE LAST 5 DAYS. Performed at [facility]    Report Status 04/10/2015 FINAL  Final  Urine culture     Status: None   Collection Time: 04/07/15  9:38 PM  Result Value Ref Range Status   Specimen Description URINE, CLEAN CATCH  Final   Special Requests NONE  Final   Culture   Final    >=100,000 COLONIES/mL ESCHERICHIA COLI Performed at [facility]    Report Status 04/10/2015  FINAL  Final   Organism ID, Bacteria ESCHERICHIA COLI  Final      Susceptibility   Escherichia coli - MIC*    AMPICILLIN <=2 SENSITIVE Sensitive     CEFAZOLIN <=4 SENSITIVE Sensitive     CEFTRIAXONE <=1 SENSITIVE Sensitive     CIPROFLOXACIN <=0.25 SENSITIVE Sensitive     GENTAMICIN 2 SENSITIVE Sensitive     IMIPENEM <=0.25 SENSITIVE Sensitive     NITROFURANTOIN <=16 SENSITIVE Sensitive     TRIMETH/SULFA <=20 SENSITIVE Sensitive     AMPICILLIN/SULBACTAM <=2 SENSITIVE Sensitive  PIP/TAZO <=4 SENSITIVE Sensitive     * >=100,000 COLONIES/mL ESCHERICHIA COLI  C difficile quick scan w PCR reflex     Status: None   Collection Time: 04/09/15  5:00 AM  Result Value Ref Range Status   C Diff antigen NEGATIVE NEGATIVE Final   C Diff toxin NEGATIVE NEGATIVE Final   C Diff interpretation Negative for toxigenic C. difficile  Final     Studies: No results found.  Scheduled Meds: . aspirin EC  325 mg Oral Daily  . atorvastatin  20 mg Oral q1800  . carvedilol  3.125 mg Oral BID WC  . citalopram  20 mg Oral Daily  . divalproex  250 mg Oral q morning - 10a  . donepezil  10 mg Oral QHS  . enoxaparin (LOVENOX) injection  40 mg Subcutaneous Q24H  . feeding supplement  1 Container Oral BID BM  . insulin aspart  0-9 Units Subcutaneous TID WC  . [START ON 04/11/2015] insulin glargine  25 Units Subcutaneous Daily  . lisinopril  2.5 mg Oral Daily  . memantine  28 mg Oral Daily  . piperacillin-tazobactam (ZOSYN)  IV  3.375 g Intravenous 3 times per day  . vancomycin  750 mg Intravenous Q12H    Continuous Infusions:     Time spent: 15 minutes  Hollice Espy  [facility] Pager [phone]. If 7PM-7AM, please contact night-coverage at [email or web address], password [facility] 04/10/2015, 1:40 PM  LOS: 2 days

## 2015-04-10 NOTE — Care Management Important Message (Signed)
Important Message  Patient Details  Name: Dana [name] MRN: [id number] Date of Birth: 03/26/1946   Medicare Important Message Given:  [facility] notification given    Haskell Flirt 04/10/2015, 11:53 AMImportant Message  Patient Details  Name: Dana [name] MRN: [id number] Date of Birth: [birth year]   Medicare Important Message Given:  Yes-second notification given    Haskell Flirt 04/10/2015, 11:53 AM

## 2015-04-11 DIAGNOSIS — I10 Essential (primary) hypertension: Secondary | ICD-10-CM

## 2015-04-11 LAB — CBC
HCT: 27.2 % — ABNORMAL LOW (ref 36.0–46.0)
Hemoglobin: 9.2 g/dL — ABNORMAL LOW (ref 12.0–15.0)
MCH: 27.4 pg (ref 26.0–34.0)
MCHC: 33.8 g/dL (ref 30.0–36.0)
MCV: 81 fL (ref 78.0–100.0)
PLATELETS: 299 10*3/uL (ref 150–400)
RBC: 3.36 MIL/uL — AB (ref 3.87–5.11)
RDW: 16 % — AB (ref 11.5–15.5)
WBC: 12.8 10*3/uL — AB (ref 4.0–10.5)

## 2015-04-11 LAB — GLUCOSE, CAPILLARY
GLUCOSE-CAPILLARY: 267 mg/dL — AB (ref 65–99)
GLUCOSE-CAPILLARY: 238 mg/dL — AB (ref 65–99)
GLUCOSE-CAPILLARY: 200 mg/dL — AB (ref 65–99)

## 2015-04-11 MED ORDER — LOPERAMIDE HCL 2 MG PO CAPS: 2.0000 mg | ORAL_CAPSULE | ORAL | Status: AC | PRN

## 2015-04-11 MED ORDER — BOOST / RESOURCE BREEZE PO LIQD: 1.0000 | Freq: Two times a day (BID) | ORAL | Status: DC

## 2015-04-11 MED ORDER — CIPROFLOXACIN HCL 500 MG PO TABS: 500.0000 mg | ORAL_TABLET | Freq: Two times a day (BID) | ORAL | Status: AC

## 2015-04-11 NOTE — Patient Outreach (Signed)
[facility] Care Management  04/11/2015  Dana [name] [birth year] [id number]   Referral from Raiford Noble, RN to assign RN, assigned [name], RN.  Thanks, [name]. [name] [name] Care Management [facility] CM Assistant Phone: [phone] Fax: [phone]

## 2015-04-11 NOTE — Consult Note (Signed)
   [facility] Inpatient Consult   04/11/2015  Dana [name] [birth year] [id number]   Patient evaluated for [facility] Care Management services. Went to bedside. Husband not in room. Permission received to call patient's husband to discuss [facility] Care Management services. Called Mr. Waldo Laine on home phone to explain and discuss [facility]. Explained that [facility] will not interfere or replace services provided by home health. Explained that he will receive post hospital discharge calls about his wife, as she has confusion, and monthly home visits will be assessed if needed. Mr. Mccune agreeable and states his home phone is the best number to reach him [phone]. States he does not use his cell number and he is at home most of the time anyway. Verbal consent received. Packet will be left at bedside for his records. Will make inpatient RNCM aware.   Raiford Noble, MSN-Ed, RN,BSN [facility] Liaison [phone]

## 2015-04-11 NOTE — Progress Notes (Signed)
Speech Language Pathology Treatment: Dysphagia  Patient Details Name: KADIJA CRUZEN MRN: [id number] DOB: [birth year] Today's Date: 04/11/2015 Time: 4098-1191 SLP Time Calculation (min) (ACUTE ONLY): 10 min  Assessment / Plan / Recommendation Clinical Impression  Pt tolerating regular diet, thin liquids well - occasional pocketing right side, requiring min verbal cues to clear.  No s/s of aspiration.  Able to self feed. Pt potentially for D/C home this afternoon per CM notes.  Husband not present for education; SLP completed basic education after initial evaluation.  Will sign off - no SLP f/u warranted.    HPI Other Pertinent Information: 69 yo female adm to [facility] with cough, AMS.  Pt has h/o Alzheimer's dementia, DM, CHF, diagnosed with HCAP.  Pt CXR showed left lower lobe consolidation c/w pna 04/07/2015.  Spouse present and reports pt without problems swallowing, coughing some but not associated with intake.     Pertinent Vitals Pain Assessment: No/denies pain  SLP Plan       Recommendations Diet recommendations: Regular;Thin liquid Liquids provided via: Cup;Straw Medication Administration: Whole meds with puree (crush if large) Supervision: Staff to assist with self feeding Compensations: Slow rate;Small sips/bites;Check for pocketing              Oral Care Recommendations: Oral care BID    GO     Blenda Mounts Laurice 04/11/2015, 1:43 PM

## 2015-04-11 NOTE — Care Management Note (Addendum)
Case Management Note  Patient Details  Name: Dana [name] MRN: [id number] Date of Birth: 19-Oct-1945  Subjective/Objective:                   69 yo admitted with HCAP Action/Plan: Discharge planning Expected Discharge Date:  04/11/15               Expected Discharge Plan:  Home w Home Health Services  In-House Referral:     Discharge planning Services  CM Consult  Post Acute Care Choice:  Resumption of Svcs/PTA Provider Choice offered to:  Spouse  DME Arranged:  Hospital bed DME Agency:  [facility]  HH Arranged:  RN, PT, Nurse's Aide [facility] Agency:  [facility]  Status of Service:  Completed, signed off  Medicare Important Message Given:  Yes-second notification given Date Medicare IM Given:    Medicare IM give by:    Date Additional Medicare IM Given:    Additional Medicare Important Message give by:     If discussed at Long Length of Stay Meetings, dates discussed:    Additional Comments: 13:13 CM called unit secretary to relay message to RN: Hospital bed will be delivered by 15:00 this afternoon. CM called [facility] to notify of discharge and per Kim's request faxed facesheet, F2F, orders, H&P, and last progress note to [phone].  CM called [facility] DME rep to please arrange for the delivery of the hospital bed prior to discharge.  No other CM needs were communicated. Yves Dill, RN 04/11/2015, 10:47 AM

## 2015-04-11 NOTE — Discharge Summary (Signed)
Discharge Summary  Dana [name] ZOX:[id number] DOB: August 24, 1945  PCP: Romero Belling, MD  Admit date: 04/07/2015 Discharge date: 04/11/2015  Time spent: 25 minutes  Recommendations for Outpatient Follow-up:  1. New medication: Cipro 500 mg by mouth twice a day 6 more days 2. Patient will follow up with her PCP in the next 4 weeks  Discharge Diagnoses:  Active Hospital Problems   Diagnosis Date Noted  . HCAP (healthcare-associated pneumonia) 04/08/2015  . Chronic combined systolic and diastolic congestive heart failure 04/09/2015  . Acute pyelonephritis 04/08/2015  . Pyelonephritis 04/08/2015  . 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 Alzheimer's disease 10/13/2012  . Diabetes mellitus type 2, uncontrolled 09/25/2012  . Essential hypertension 02/08/2007    Resolved Hospital Problems   Diagnosis Date Noted Date Resolved  No resolved problems to display.    Discharge Condition: Improved, being discharged home  Diet recommendation: Carb modified low sodium  Filed Weights   04/08/15 0158 04/09/15 0501 04/11/15 0439  Weight: 79.3 kg (174 lb 13.2 oz) 77.3 kg (170 lb 6.7 oz) 80.8 kg (178 lb 2.1 oz)    History of present illness:  69 year old female with past medical history of Alzheimer's dementia, diabetes mellitus with secondary blindness and CHF admitted on 9/19 for pyelonephritis as well as healthcare associated pneumonia after coming in complaining of bowel discomfort and CT noting pyelonephritis. She's also been having episodes of diarrhea, which is been more chronic in nature.   Hospital Course:  Principal Problem:   HCAP (healthcare-associated pneumonia): Patient continued on IV antibiotics during hospitalization and completed 5 days of antibiotics prior to discharge.  Swallow evaluation checked and patient passed Active Problems:   Essential hypertension: Blood pressure stable   Diabetes mellitus type 2, uncontrolled: CBGs slightly above 200 likely in  the setting of acute infection. A1c done last month at 7.4. She discharged on her normal regimen   Alzheimer's disease: Stable continue on Namenda   Acute pyelonephritis: Blood cultures grew out pansensitive Escherichia coli. 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 Weights monitored. Looked to be euvolemic. Echocardiogram done in August noted ejection fraction of 40-45 percent and grade 1 diastolic dysfunction. BNP normal   Procedures:  None  Consultations:  None  Discharge Exam: BP 111/90 mmHg  Pulse 73  Temp(Src) 97.7 F (36.5 C) (Oral)  Resp 20  Ht 5\' 3"  (1.6 m)  Wt 80.8 kg (178 lb 2.1 oz)  BMI 31.56 kg/m2  SpO2 95%  General: Oriented 1, no acute distress Cardiovascular: Regular rate and rhythm, S1-S2 Respiratory: Clear to auscultation bilaterally, moderate inspiratory effort  Discharge Instructions You were cared for by a hospitalist during your hospital stay. If you have any questions about your discharge medications or the care you received while you were in the hospital after you are discharged, you can call the unit and asked to speak with the hospitalist on call if the hospitalist that took care of you is not available. Once you are discharged, your primary care physician will handle any further medical issues. Please note that NO REFILLS for any discharge medications will be authorized once you are discharged, as it is imperative that you return to your primary care physician (or establish a relationship with a primary care physician if you do not have one) for  your aftercare needs so that they can reassess your need for medications and monitor your lab values.  Discharge Instructions    AMB Referral to [facility] Care Management    Complete by:  As directed   Please assign to [facility] RNCM.  Likely discharge today 04/11/15. Verbal consent received by husband [name] Thoman/caregiver and primary contact at [phone]. Please call husband for transition of care calls. Verbal consent obtained. Disease and symptom management for CHF, DM. Please assess for [facility] LCSW needs. Will have home health as well. Thanks. Raiford Noble, MSN-Ed, [facility] Liaison  Reason for consult:  Please assign to [facility] RNCM.  Diagnoses of:   Heart Failure Diabetes    Expected date of contact:  1-3 days (reserved for hospital discharges)     Diet - low sodium heart healthy    Complete by:  As directed      Increase activity slowly    Complete by:  As directed             Medication List    TAKE these medications        aspirin EC 325 MG tablet  TAKE 1 TABLET (325 MG TOTAL) BY MOUTH DAILY.     atorvastatin 20 MG tablet  Commonly known as:  LIPITOR  TAKE 1 TABLET BY MOUTH EVERY DAY     carvedilol 3.125 MG tablet  Commonly known as:  COREG  Take 1 tablet (3.125 mg total) by mouth 2 (two) times daily with a meal.     ciprofloxacin 500 MG tablet  Commonly known as:  CIPRO  Take 1 tablet (500 mg total) by mouth 2 (two) times daily.     citalopram 20 MG tablet  Commonly known as:  CELEXA  TAKE 1 TABLET (20 MG TOTAL) BY MOUTH DAILY.     divalproex 250 MG 24 hr tablet  Commonly known as:  DEPAKOTE ER  Take 250 mg by mouth every morning.     donepezil 10 MG tablet  Commonly known as:  ARICEPT  Take 10 mg by mouth at bedtime.     feeding supplement Liqd  Take 1 Container by mouth 2 (two) times daily between meals.     furosemide 40 MG tablet  Commonly known as:  LASIX  Take 40 mg by mouth daily.     hyoscyamine 0.125 MG SL tablet  Commonly known as:  LEVSIN SL  PLACE 1 TABLET UNDER THE TONGUE TWICE DAILY AS NEEDED FOR CRAMPS AND SPASMS.     Insulin Lispro Prot & Lispro (50-50) 100 UNIT/ML Kwikpen  Commonly known as:  HUMALOG MIX 50/50 KWIKPEN  Inject 25 Units into the  skin daily with breakfast.     lisinopril 2.5 MG tablet  Commonly known as:  PRINIVIL,ZESTRIL  TAKE 1 TABLET (2.5 MG TOTAL) BY MOUTH EVERY MORNING.     loperamide 2 MG capsule  Commonly known as:  IMODIUM  Take 1 capsule (2 mg total) by mouth as needed for diarrhea or loose stools.     memantine tablet pack  Commonly known as:  NAMENDA TITRATION PACK  Take 7-28 mg by mouth at bedtime. Take 1 tablet (7 mg) for Days 1-7, Take 1 tablet (14 mg) for Days 8-14, Take 1 tablet (21 mg) for Days 15-21, Take 1 tablet (28 mg) Days 22-28.     NAMENDA XR 28 MG Cp24 24 hr capsule  Generic drug:  memantine  Take 28 mg by mouth daily.     sodium  chloride 0.65 % Soln nasal spray  Commonly known as:  OCEAN  Place 2 sprays into both nostrils as needed for congestion.     SYSTANE BALANCE OP  Apply 1 drop to eye 3 (three) times daily as needed (dry eyes).       Allergies  Allergen Reactions  . Aspirin Nausea Only  . Codeine Nausea Only       Follow-up Information    Follow up with [facility].   Specialty:  Home Health Services   Why:  home health nurse, physical therapy and aide   Contact information:   [address] [place] [address] [phone]       Follow up with [facility].   Why:  hospital bed   Contact information:   [address] [place] [address] [phone]        The results of significant diagnostics from this hospitalization (including imaging, microbiology, ancillary and laboratory) are listed below for reference.    Significant Diagnostic Studies: Dg Chest 2 View  04/07/2015   CLINICAL DATA:  Diarrhea beginning yesterday, fever, shortness of breath, CHF, hypertension, type I diabetes mellitus  EXAM: CHEST  2 VIEW  COMPARISON:  02/26/2015  FINDINGS: Enlargement of cardiac silhouette.  Atherosclerotic calcification aorta.  Normal mediastinal contours.  Infiltrate identified in LEFT lower lobe question  pneumonia.  Minimal infiltrate versus atelectasis at RIGHT base.  Remaining lungs clear.  No gross pleural effusion or pneumothorax.  IMPRESSION: Enlargement of cardiac silhouette with minimal atelectasis or infiltrate at RIGHT base.  LEFT lower lobe consolidation consistent with pneumonia.   Electronically Signed   By: Ulyses Southward M.D.   On: 04/07/2015 21:38   Ct Abdomen Pelvis W Contrast  04/08/2015   CLINICAL DATA:  Fever and diarrhea for 2 days. White cell count 16. Red and white cells in urine. Abdominal pain.  EXAM: CT ABDOMEN AND PELVIS WITH CONTRAST  TECHNIQUE: Multidetector CT imaging of the abdomen and pelvis was performed using the standard protocol following bolus administration of intravenous contrast.  CONTRAST:  OMNIPAQUE IOHEXOL 300 MG/ML SOLN, 25mL OMNIPAQUE IOHEXOL 300 MG/ML SOLN  COMPARISON:  None.  FINDINGS: Small bilateral pleural effusions with basilar atelectasis or infiltration, greater on the left. Basilar pneumonia not excluded. Mild cardiac enlargement. Coronary artery calcifications.  Mild gallbladder wall thickening. No stones or infiltration demonstrated. No bile duct dilatation. Liver, spleen, pancreas, adrenal glands, abdominal aorta, inferior vena cava, and retroperitoneal lymph nodes are unremarkable. Nephrograms are asymmetrical with mild delayed of left nephrogram and mildly heterogeneous left nephrogram. There is mild left hydronephrosis and hydroureter with stranding around the left kidney and along the left ureter. No obstructing lesion or stone is definitively identified. However, there are multiple calcifications in the left renal pelvis which probably represent phleboliths. This may obscure identification of a small stone. Findings could represent obstruction due to occult stone or inflammatory stricture, or pyelonephritis and ureteritis. Stomach, small bowel, and colon are mostly decompressed. Inflammatory stranding in the left pericolic gutter probably arises from  the left kidney but a colonic inflammatory process is not excluded. Edema in the left flank musculature also may be inflammatory. No free air in the abdomen.  Pelvis: Bladder wall is thickened, probably due to under distention although infection could also have this appearance. Calcification in the left ovary probably dystrophic. Appendix is not definitively identified. No free or loculated pelvic fluid collections. No pelvic mass or lymphadenopathy. Degenerative changes throughout  the lumbar spine. Loss of disc space at L4-5 without evidence of bone destruction is probably degenerative. Probable stenosis is of the lumbar spine at L3-4.  IMPRESSION: Asymmetric left renal nephrogram with mild left pyelocaliectasis and ureterectasis. No obstructing stones are demonstrated. Changes could be due to occult obstructing stone or lesion versus pyelonephritis and ureteritis. There is inflammatory stranding around the left kidney in ureter and in the left costophrenic angle region. Edema in the left flank subcutaneous fat. Bladder wall thickening could be due to infection or under distention. Infiltration or atelectasis in both lung bases with small pleural effusions, greater on the left.   Electronically Signed   By: Burman Nieves M.D.   On: 04/08/2015 00:33   US Renal  04/08/2015   CLINICAL DATA:  Hydronephrosis seen on CT.  EXAM: RENAL / URINARY TRACT ULTRASOUND COMPLETE  COMPARISON:  CT abdomen pelvis dated 04/07/2015.  FINDINGS: Right Kidney:  Length: 15.3 cm. Cortical thickness and echogenicity are within normal limits without mass or cyst. No renal stone or hydronephrosis.  Left Kidney:  Length: 15 cm. Cortical thickness and echogenicity are within normal limits without mass or cyst. No renal stone seen. There is moderate hydronephrosis as identified on earlier CT.  Bladder:  Appears normal for degree of bladder distention.  IMPRESSION: Moderate left hydronephrosis. Flow is shown from the distal left ureter into  the bladder (left ureteral jet visualized). No complete ureteral obstruction.  As described on the earlier CT report, this left-sided hydronephrosis could be caused by partially obstructing ureteral wall thickening related to neoplastic, infectious, or inflammatory process.  Normal right kidney.  Bladder is unremarkable.   Electronically Signed   By: Bary Richard M.D.   On: 04/08/2015 11:14    Microbiology: Recent Results (from the past 240 hour(s))  Culture, blood (routine x 2)     Status: None   Collection Time: 04/07/15  7:57 PM  Result Value Ref Range Status   Specimen Description BLOOD RIGHT ARM  Final   Special Requests BOTTLES DRAWN AEROBIC AND ANAEROBIC  Final   Culture  Setup Time   Final    GRAM NEGATIVE RODS IN BOTH AEROBIC AND ANAEROBIC BOTTLES CRITICAL RESULT CALLED TO, READ BACK BY AND VERIFIED WITH: Q HOLLAWAY 04/08/15 @ 1245 M VESTAL    Culture   Final    ESCHERICHIA COLI Performed at [facility]    Report Status 04/10/2015 FINAL  Final   Organism ID, Bacteria ESCHERICHIA COLI  Final      Susceptibility   Escherichia coli - MIC*    AMPICILLIN 4 SENSITIVE Sensitive     CEFAZOLIN <=4 SENSITIVE Sensitive     CEFEPIME <=1 SENSITIVE Sensitive     CEFTAZIDIME <=1 SENSITIVE Sensitive     CEFTRIAXONE <=1 SENSITIVE Sensitive     CIPROFLOXACIN <=0.25 SENSITIVE Sensitive     GENTAMICIN <=1 SENSITIVE Sensitive     IMIPENEM <=0.25 SENSITIVE Sensitive     TRIMETH/SULFA <=20 SENSITIVE Sensitive     AMPICILLIN/SULBACTAM 4 SENSITIVE Sensitive     PIP/TAZO <=4 SENSITIVE Sensitive     * ESCHERICHIA COLI  Culture, blood (routine x 2)     Status: None   Collection Time: 04/07/15  8:26 PM  Result Value Ref Range Status   Specimen Description BLOOD RIGHT ANTECUBITAL  Final   Special Requests BOTTLES DRAWN AEROBIC AND ANAEROBIC  Final   Culture  Setup Time   Final    GRAM NEGATIVE RODS AEROBIC BOTTLE ONLY CRITICAL  RESULT CALLED TO, READ BACK BY AND VERIFIED WITH:  Meyer Cory RN 11:30 04/09/15 ([email or web address]    Culture   Final    ESCHERICHIA COLI SUSCEPTIBILITIES PERFORMED ON PREVIOUS CULTURE WITHIN THE LAST 5 DAYS. Performed at [facility]    Report Status 04/10/2015 FINAL  Final  Urine culture     Status: None   Collection Time: 04/07/15  9:38 PM  Result Value Ref Range Status   Specimen Description URINE, CLEAN CATCH  Final   Special Requests NONE  Final   Culture   Final    >=100,000 COLONIES/mL ESCHERICHIA COLI Performed at [facility]    Report Status 04/10/2015 FINAL  Final   Organism ID, Bacteria ESCHERICHIA COLI  Final      Susceptibility   Escherichia coli - MIC*    AMPICILLIN <=2 SENSITIVE Sensitive     CEFAZOLIN <=4 SENSITIVE Sensitive     CEFTRIAXONE <=1 SENSITIVE Sensitive     CIPROFLOXACIN <=0.25 SENSITIVE Sensitive     GENTAMICIN 2 SENSITIVE Sensitive     IMIPENEM <=0.25 SENSITIVE Sensitive     NITROFURANTOIN <=16 SENSITIVE Sensitive     TRIMETH/SULFA <=20 SENSITIVE Sensitive     AMPICILLIN/SULBACTAM <=2 SENSITIVE Sensitive     PIP/TAZO <=4 SENSITIVE Sensitive     * >=100,000 COLONIES/mL ESCHERICHIA COLI  C difficile quick scan w PCR reflex     Status: None   Collection Time: 04/09/15  5:00 AM  Result Value Ref Range Status   C Diff antigen NEGATIVE NEGATIVE Final   C Diff toxin NEGATIVE NEGATIVE Final   C Diff interpretation Negative for toxigenic C. difficile  Final     Labs: Basic Metabolic Panel:  Recent Labs Lab 04/07/15 2026 04/07/15 2203 04/09/15 0515 04/10/15 0530  NA 135 135 138 134*  K 3.7 3.7 2.9* 3.2*  CL 97* 96* 98* 96*  CO2 28  --  29 30  GLUCOSE 331* 336* 168* 269*  BUN 18 22* 19 19  CREATININE 0.88 0.90 0.88 0.88  CALCIUM 7.8*  --  8.0* 7.9*   Liver Function Tests:  Recent Labs Lab 04/07/15 2026  AST 26  ALT 17  ALKPHOS 94  BILITOT 1.1  PROT 6.3*  ALBUMIN 2.3*    Recent Labs Lab 04/07/15 2026  LIPASE 26   No results for input(s): AMMONIA in the last 168  hours. CBC:  Recent Labs Lab 04/07/15 2026 04/07/15 2203 04/09/15 0515 04/10/15 0530 04/11/15 0520  WBC 16.0*  --  18.2* 13.9* 12.8*  NEUTROABS 12.9*  --   --   --   --   HGB 9.8* 10.5* 9.7* 9.4* 9.2*  HCT 29.6* 31.0* 29.1* 27.8* 27.2*  MCV 81.5  --  81.1 80.1 81.0  PLT 257  --  266 273 299   Cardiac Enzymes: No results for input(s): CKTOTAL, CKMB, CKMBINDEX, TROPONINI in the last 168 hours. BNP: BNP (last 3 results)  Recent Labs  04/09/15 1500  BNP 138.1*    ProBNP (last 3 results)  Recent Labs  06/18/14 1553  PROBNP 148.0*    CBG:  Recent Labs Lab 04/10/15 1724 04/10/15 2108 04/11/15 0754 04/11/15 1154 04/11/15 1704  GLUCAP 319* 284* 267* 238* 200*       Signed:  KRISHNAN,SENDIL K  [facility] 04/11/2015, 5:50 PM

## 2015-04-12 ENCOUNTER — Other Ambulatory Visit: Payer: Self-pay

## 2015-04-12 ENCOUNTER — Telehealth: Payer: Self-pay | Admitting: Endocrinology

## 2015-04-12 DIAGNOSIS — F339 Major depressive disorder, recurrent, unspecified: Secondary | ICD-10-CM | POA: Diagnosis not present

## 2015-04-12 DIAGNOSIS — R262 Difficulty in walking, not elsewhere classified: Secondary | ICD-10-CM | POA: Diagnosis not present

## 2015-04-12 DIAGNOSIS — E119 Type 2 diabetes mellitus without complications: Secondary | ICD-10-CM | POA: Diagnosis not present

## 2015-04-12 DIAGNOSIS — I10 Essential (primary) hypertension: Secondary | ICD-10-CM | POA: Diagnosis not present

## 2015-04-12 DIAGNOSIS — I5020 Unspecified systolic (congestive) heart failure: Secondary | ICD-10-CM | POA: Diagnosis not present

## 2015-04-12 DIAGNOSIS — G4089 Other seizures: Secondary | ICD-10-CM | POA: Diagnosis not present

## 2015-04-12 DIAGNOSIS — M6281 Muscle weakness (generalized): Secondary | ICD-10-CM | POA: Diagnosis not present

## 2015-04-12 DIAGNOSIS — R55 Syncope and collapse: Secondary | ICD-10-CM | POA: Diagnosis not present

## 2015-04-12 DIAGNOSIS — G309 Alzheimer's disease, unspecified: Secondary | ICD-10-CM | POA: Diagnosis not present

## 2015-04-12 NOTE — Patient Outreach (Signed)
[facility]) Care Management  04/12/2015  LISETT DIRUSSO 01/03/1946 [id number]  Assessment: 69 year old admitted 9/18-9-22 with HCAP, encephalopathy, UTI/pyelonephritis. History of diabetes, heart failure, dementia. RNCM spoke with member's husband, [name] Ensz who is primary care giver. Member confused/dementia. RNCM reinforced [facility] Care Management services. Transition of care call completed.  Reviewed scheduled office visits. Member does not have a follow up appointment with primary care. Mr. Hildebrant agreed for [facility] to call to schedule a follow up hospital visit.  Mobility- Mr Buske reports member does not get up to ambulate anymore and it is difficult to get her up for transport to doctor's appointments. Member does have home health physical therapy initiated by [facility]. RNCM also discussed In home physician services as an option. Also, encouraged Mr. Pita to discuss with home health physical therapist and work with physical therapist regarding transfer strategies.   Medications reviewed-member reports he has all her medications, and will pick her antibiotics today. RNCM discussed the importance of member taking until bottle is finished. Discharge instructions state Coreg 3.125mg  twice a day. Mr. Mcquitty states he was told by primary care to give it to her once a day. RNCM could only advise to take as instructed per discharge instructions.   RNCM encouraged Mr. Waldo Laine to contact care coordinator as needed-Contact number provided. RNCM discussed 24 hour nurse advice line-nurse line number provided.   Plan:  RNCM will call primary care to request member be called to clarify coreg dosing. RNCM will call primary care to shedule follow up appointment.  Transition of care call next week. Care Coordination with home health agency  [name], RN, MSN, [facility] Community Care Coordinator Cell: [phone]

## 2015-04-12 NOTE — Telephone Encounter (Signed)
Dana [name] has question bout patient medication Carvedilol 3.125 mg

## 2015-04-12 NOTE — Telephone Encounter (Signed)
Pt was instructed to follow Dr.Ellison medication at last visit by taking 1 tab

## 2015-04-13 DIAGNOSIS — M6281 Muscle weakness (generalized): Secondary | ICD-10-CM | POA: Diagnosis not present

## 2015-04-13 DIAGNOSIS — I10 Essential (primary) hypertension: Secondary | ICD-10-CM | POA: Diagnosis not present

## 2015-04-13 DIAGNOSIS — I5020 Unspecified systolic (congestive) heart failure: Secondary | ICD-10-CM | POA: Diagnosis not present

## 2015-04-13 DIAGNOSIS — G309 Alzheimer's disease, unspecified: Secondary | ICD-10-CM | POA: Diagnosis not present

## 2015-04-13 DIAGNOSIS — G4089 Other seizures: Secondary | ICD-10-CM | POA: Diagnosis not present

## 2015-04-13 DIAGNOSIS — E119 Type 2 diabetes mellitus without complications: Secondary | ICD-10-CM | POA: Diagnosis not present

## 2015-04-13 DIAGNOSIS — F339 Major depressive disorder, recurrent, unspecified: Secondary | ICD-10-CM | POA: Diagnosis not present

## 2015-04-13 DIAGNOSIS — R55 Syncope and collapse: Secondary | ICD-10-CM | POA: Diagnosis not present

## 2015-04-13 DIAGNOSIS — R262 Difficulty in walking, not elsewhere classified: Secondary | ICD-10-CM | POA: Diagnosis not present

## 2015-04-15 DIAGNOSIS — E119 Type 2 diabetes mellitus without complications: Secondary | ICD-10-CM | POA: Diagnosis not present

## 2015-04-15 DIAGNOSIS — F339 Major depressive disorder, recurrent, unspecified: Secondary | ICD-10-CM | POA: Diagnosis not present

## 2015-04-15 DIAGNOSIS — I10 Essential (primary) hypertension: Secondary | ICD-10-CM | POA: Diagnosis not present

## 2015-04-15 DIAGNOSIS — G4089 Other seizures: Secondary | ICD-10-CM | POA: Diagnosis not present

## 2015-04-15 DIAGNOSIS — R262 Difficulty in walking, not elsewhere classified: Secondary | ICD-10-CM | POA: Diagnosis not present

## 2015-04-15 DIAGNOSIS — I5020 Unspecified systolic (congestive) heart failure: Secondary | ICD-10-CM | POA: Diagnosis not present

## 2015-04-15 DIAGNOSIS — M6281 Muscle weakness (generalized): Secondary | ICD-10-CM | POA: Diagnosis not present

## 2015-04-15 DIAGNOSIS — G309 Alzheimer's disease, unspecified: Secondary | ICD-10-CM | POA: Diagnosis not present

## 2015-04-15 DIAGNOSIS — R55 Syncope and collapse: Secondary | ICD-10-CM | POA: Diagnosis not present

## 2015-04-16 ENCOUNTER — Ambulatory Visit: Payer: Self-pay | Admitting: Endocrinology

## 2015-04-17 ENCOUNTER — Other Ambulatory Visit: Payer: Self-pay | Admitting: Cardiovascular Disease

## 2015-04-19 ENCOUNTER — Other Ambulatory Visit: Payer: Self-pay

## 2015-04-19 DIAGNOSIS — I10 Essential (primary) hypertension: Secondary | ICD-10-CM | POA: Diagnosis not present

## 2015-04-19 DIAGNOSIS — I5020 Unspecified systolic (congestive) heart failure: Secondary | ICD-10-CM | POA: Diagnosis not present

## 2015-04-19 DIAGNOSIS — G309 Alzheimer's disease, unspecified: Secondary | ICD-10-CM | POA: Diagnosis not present

## 2015-04-19 DIAGNOSIS — G4089 Other seizures: Secondary | ICD-10-CM | POA: Diagnosis not present

## 2015-04-19 DIAGNOSIS — R55 Syncope and collapse: Secondary | ICD-10-CM | POA: Diagnosis not present

## 2015-04-19 DIAGNOSIS — R262 Difficulty in walking, not elsewhere classified: Secondary | ICD-10-CM | POA: Diagnosis not present

## 2015-04-19 DIAGNOSIS — M6281 Muscle weakness (generalized): Secondary | ICD-10-CM | POA: Diagnosis not present

## 2015-04-19 DIAGNOSIS — E119 Type 2 diabetes mellitus without complications: Secondary | ICD-10-CM | POA: Diagnosis not present

## 2015-04-19 DIAGNOSIS — F339 Major depressive disorder, recurrent, unspecified: Secondary | ICD-10-CM | POA: Diagnosis not present

## 2015-04-19 NOTE — Patient Outreach (Signed)
[facility]) Care Management  04/19/2015  KANDIS HENRY 1945/10/30 [id number]  Assessment: 69 year old post hospitalization for pneumonia/UTI. RNCM called for Transition of care call. No answer. Unable to leave message.  Plan: Call next week.  [name], RN, MSN, [facility] Community Care Coordinator Cell: [phone]

## 2015-04-20 DIAGNOSIS — I5020 Unspecified systolic (congestive) heart failure: Secondary | ICD-10-CM | POA: Diagnosis not present

## 2015-04-20 DIAGNOSIS — M6281 Muscle weakness (generalized): Secondary | ICD-10-CM | POA: Diagnosis not present

## 2015-04-20 DIAGNOSIS — G4089 Other seizures: Secondary | ICD-10-CM | POA: Diagnosis not present

## 2015-04-20 DIAGNOSIS — R55 Syncope and collapse: Secondary | ICD-10-CM | POA: Diagnosis not present

## 2015-04-20 DIAGNOSIS — E119 Type 2 diabetes mellitus without complications: Secondary | ICD-10-CM | POA: Diagnosis not present

## 2015-04-20 DIAGNOSIS — R262 Difficulty in walking, not elsewhere classified: Secondary | ICD-10-CM | POA: Diagnosis not present

## 2015-04-20 DIAGNOSIS — F339 Major depressive disorder, recurrent, unspecified: Secondary | ICD-10-CM | POA: Diagnosis not present

## 2015-04-20 DIAGNOSIS — I10 Essential (primary) hypertension: Secondary | ICD-10-CM | POA: Diagnosis not present

## 2015-04-20 DIAGNOSIS — G309 Alzheimer's disease, unspecified: Secondary | ICD-10-CM | POA: Diagnosis not present

## 2015-04-23 ENCOUNTER — Telehealth: Payer: Self-pay | Admitting: Endocrinology

## 2015-04-23 DIAGNOSIS — R55 Syncope and collapse: Secondary | ICD-10-CM | POA: Diagnosis not present

## 2015-04-23 DIAGNOSIS — G4089 Other seizures: Secondary | ICD-10-CM | POA: Diagnosis not present

## 2015-04-23 DIAGNOSIS — G309 Alzheimer's disease, unspecified: Secondary | ICD-10-CM | POA: Diagnosis not present

## 2015-04-23 DIAGNOSIS — E119 Type 2 diabetes mellitus without complications: Secondary | ICD-10-CM | POA: Diagnosis not present

## 2015-04-23 DIAGNOSIS — G301 Alzheimer's disease with late onset: Principal | ICD-10-CM

## 2015-04-23 DIAGNOSIS — I10 Essential (primary) hypertension: Secondary | ICD-10-CM | POA: Diagnosis not present

## 2015-04-23 DIAGNOSIS — I5020 Unspecified systolic (congestive) heart failure: Secondary | ICD-10-CM | POA: Diagnosis not present

## 2015-04-23 DIAGNOSIS — F0280 Dementia in other diseases classified elsewhere without behavioral disturbance: Secondary | ICD-10-CM

## 2015-04-23 DIAGNOSIS — M6281 Muscle weakness (generalized): Secondary | ICD-10-CM | POA: Diagnosis not present

## 2015-04-23 DIAGNOSIS — R262 Difficulty in walking, not elsewhere classified: Secondary | ICD-10-CM | POA: Diagnosis not present

## 2015-04-23 DIAGNOSIS — F339 Major depressive disorder, recurrent, unspecified: Secondary | ICD-10-CM | POA: Diagnosis not present

## 2015-04-23 NOTE — Telephone Encounter (Signed)
See notes below and please advise on hospice referral.  Thanks!

## 2015-04-23 NOTE — Telephone Encounter (Signed)
[name] from [facility], pt has lost a lot of weight and has given up hope Dana [name] is asking for [place] to contact hospice and send and order for a consult

## 2015-04-23 NOTE — Telephone Encounter (Signed)
i made referral to hospice

## 2015-04-23 NOTE — Telephone Encounter (Signed)
Pt has been bed ridden for the past 2 weeks she is very depressed and sad now and is going down hill very quickly [phone]

## 2015-04-24 DIAGNOSIS — F339 Major depressive disorder, recurrent, unspecified: Secondary | ICD-10-CM | POA: Diagnosis not present

## 2015-04-24 DIAGNOSIS — M6281 Muscle weakness (generalized): Secondary | ICD-10-CM | POA: Diagnosis not present

## 2015-04-24 DIAGNOSIS — E119 Type 2 diabetes mellitus without complications: Secondary | ICD-10-CM | POA: Diagnosis not present

## 2015-04-24 DIAGNOSIS — R262 Difficulty in walking, not elsewhere classified: Secondary | ICD-10-CM | POA: Diagnosis not present

## 2015-04-24 DIAGNOSIS — G309 Alzheimer's disease, unspecified: Secondary | ICD-10-CM | POA: Diagnosis not present

## 2015-04-24 DIAGNOSIS — G4089 Other seizures: Secondary | ICD-10-CM | POA: Diagnosis not present

## 2015-04-24 DIAGNOSIS — I5020 Unspecified systolic (congestive) heart failure: Secondary | ICD-10-CM | POA: Diagnosis not present

## 2015-04-24 DIAGNOSIS — I10 Essential (primary) hypertension: Secondary | ICD-10-CM | POA: Diagnosis not present

## 2015-04-24 DIAGNOSIS — R55 Syncope and collapse: Secondary | ICD-10-CM | POA: Diagnosis not present

## 2015-04-24 NOTE — Telephone Encounter (Signed)
Annasha, could you work on getting this set up? Thanks!

## 2015-04-24 NOTE — Telephone Encounter (Signed)
Im not sure how.I have sent you a message in your in basket about this

## 2015-04-25 DIAGNOSIS — G4089 Other seizures: Secondary | ICD-10-CM | POA: Diagnosis not present

## 2015-04-25 DIAGNOSIS — I5020 Unspecified systolic (congestive) heart failure: Secondary | ICD-10-CM | POA: Diagnosis not present

## 2015-04-25 DIAGNOSIS — I10 Essential (primary) hypertension: Secondary | ICD-10-CM | POA: Diagnosis not present

## 2015-04-25 DIAGNOSIS — M6281 Muscle weakness (generalized): Secondary | ICD-10-CM | POA: Diagnosis not present

## 2015-04-25 DIAGNOSIS — G309 Alzheimer's disease, unspecified: Secondary | ICD-10-CM | POA: Diagnosis not present

## 2015-04-25 DIAGNOSIS — E119 Type 2 diabetes mellitus without complications: Secondary | ICD-10-CM | POA: Diagnosis not present

## 2015-04-25 DIAGNOSIS — R262 Difficulty in walking, not elsewhere classified: Secondary | ICD-10-CM | POA: Diagnosis not present

## 2015-04-25 DIAGNOSIS — F339 Major depressive disorder, recurrent, unspecified: Secondary | ICD-10-CM | POA: Diagnosis not present

## 2015-04-25 DIAGNOSIS — R55 Syncope and collapse: Secondary | ICD-10-CM | POA: Diagnosis not present

## 2015-04-25 NOTE — Telephone Encounter (Signed)
I contacted the pt's husband and advised of home health's recommendation of hospice care. Pt's husband at this time is declining hospice. He wants to talk with his children first. Patient's husband is going to call our office back and let us know how he would like to proceed.

## 2015-04-26 ENCOUNTER — Other Ambulatory Visit: Payer: Self-pay

## 2015-04-26 NOTE — Patient Outreach (Signed)
[facility]) Care Management  04/26/2015  Dana [name] 10-31-1945 [id number]  Assessment: 69 year old with recent admission for pneumonia, urinary tract infection, acute pylelonephritis,heart failure. RNCM spoke with member's husband(primary caregiver) who reports that he has talked with primary care office about Hospice. RNCM discussed support the patient and family would received in terms of spiritual as well as medical to assist with comfort and care. Mr. Jaroszewski to return call to primary care regarding his decision. RNCM advised Mr. Bottoms in light of his uncertainty, to request that someone from Hospice come to talk with him along with any other family members he thinks should be involved in this decision. RNCM asked how he thought member is doing and he states, she is declining, reports that member is still eating, member is still involved with home health, blood sugars ranging from 120-200 with drop to 60's a couple of time. Mr. Archambeau report member is still taking her medications.  Plan: Home visit scheduled for next week, care coordination with home health  [name], RN, MSN, [facility] Community Care Coordinator Cell: [phone]

## 2015-04-27 DIAGNOSIS — G309 Alzheimer's disease, unspecified: Secondary | ICD-10-CM | POA: Diagnosis not present

## 2015-04-27 DIAGNOSIS — E119 Type 2 diabetes mellitus without complications: Secondary | ICD-10-CM | POA: Diagnosis not present

## 2015-04-27 DIAGNOSIS — I5020 Unspecified systolic (congestive) heart failure: Secondary | ICD-10-CM | POA: Diagnosis not present

## 2015-04-27 DIAGNOSIS — I10 Essential (primary) hypertension: Secondary | ICD-10-CM | POA: Diagnosis not present

## 2015-04-27 DIAGNOSIS — M6281 Muscle weakness (generalized): Secondary | ICD-10-CM | POA: Diagnosis not present

## 2015-04-27 DIAGNOSIS — G4089 Other seizures: Secondary | ICD-10-CM | POA: Diagnosis not present

## 2015-04-27 DIAGNOSIS — F339 Major depressive disorder, recurrent, unspecified: Secondary | ICD-10-CM | POA: Diagnosis not present

## 2015-04-27 DIAGNOSIS — R55 Syncope and collapse: Secondary | ICD-10-CM | POA: Diagnosis not present

## 2015-04-27 DIAGNOSIS — R262 Difficulty in walking, not elsewhere classified: Secondary | ICD-10-CM | POA: Diagnosis not present

## 2015-04-28 ENCOUNTER — Other Ambulatory Visit: Payer: Self-pay | Admitting: Endocrinology

## 2015-04-29 ENCOUNTER — Other Ambulatory Visit: Payer: Self-pay

## 2015-04-29 VITALS — BP 150/64 | HR 70 | Resp 12

## 2015-04-29 DIAGNOSIS — Z599 Problem related to housing and economic circumstances, unspecified: Secondary | ICD-10-CM

## 2015-04-30 ENCOUNTER — Telehealth: Payer: Self-pay | Admitting: Endocrinology

## 2015-04-30 NOTE — Telephone Encounter (Signed)
I contacted the pt's husband. Patient wanted the contact number for [facility] to call and discuss a possible referral. Pt's husband stated he has question for hospice and will let us know what he decides.

## 2015-04-30 NOTE — Telephone Encounter (Signed)
Patient husband ask if you would call her.

## 2015-04-30 NOTE — Patient Outreach (Signed)
[name]) Care Management  [place]  04/29/2015   Dana [name] [birth year] [id number]  Subjective: Member denies any problems, denies pain.   Objective: BP 150/64 mmHg  Pulse 70  Resp 12  SpO2 96%, skin warm dry intact, lungs auscultated anteriorly, clear, heart rate regular. No edema noted.   Current Medications:  Current Outpatient Prescriptions  Medication Sig Dispense Refill  . aspirin EC 325 MG tablet TAKE 1 TABLET (325 MG TOTAL) BY MOUTH DAILY. 30 tablet 0  . atorvastatin (LIPITOR) 20 MG tablet TAKE 1 TABLET BY MOUTH EVERY DAY 90 tablet 0  . carvedilol (COREG) 3.125 MG tablet Take 1 tablet (3.125 mg total) by mouth 2 (two) times daily with a meal. 60 tablet 6  . citalopram (CELEXA) 20 MG tablet TAKE 1 TABLET (20 MG TOTAL) BY MOUTH DAILY. 30 tablet 3  . divalproex (DEPAKOTE ER) 250 MG 24 hr tablet Take 250 mg by mouth every morning.     . donepezil (ARICEPT) 10 MG tablet Take 10 mg by mouth at bedtime.    . furosemide (LASIX) 40 MG tablet Take 40 mg by mouth daily.     . hyoscyamine (LEVSIN SL) 0.125 MG SL tablet PLACE 1 TABLET UNDER THE TONGUE TWICE DAILY AS NEEDED FOR CRAMPS AND SPASMS. 60 tablet 0  . Insulin Lispro Prot & Lispro (HUMALOG MIX 50/50 KWIKPEN) (50-50) 100 UNIT/ML Kwikpen Inject 25 Units into the skin daily with breakfast. 15 mL 11  . lisinopril (PRINIVIL,ZESTRIL) 2.5 MG tablet TAKE 1 TABLET (2.5 MG TOTAL) BY MOUTH EVERY MORNING. 30 tablet 3  . loperamide (IMODIUM) 2 MG capsule Take 1 capsule (2 mg total) by mouth as needed for diarrhea or loose stools. 30 capsule 0  . memantine (NAMENDA TITRATION PACK) tablet pack Take 7-28 mg by mouth at bedtime. Take 1 tablet (7 mg) for Days 1-7, Take 1 tablet (14 mg) for Days 8-14, Take 1 tablet (21 mg) for Days 15-21, Take 1 tablet (28 mg) Days 22-28.    . memantine (NAMENDA XR) 28 MG CP24 24 hr capsule Take 28 mg by mouth daily.    Marland Kitchen Propylene Glycol (SYSTANE BALANCE OP) Apply 1 drop to eye 3  (three) times daily as needed (dry eyes).    . sodium chloride (OCEAN) 0.65 % SOLN nasal spray Place 2 sprays into both nostrils as needed for congestion.    . feeding supplement (BOOST / RESOURCE BREEZE) LIQD Take 1 Container by mouth 2 (two) times daily between meals. (Patient not taking: Reported on 04/29/2015) 60 Container 0  . HUMALOG MIX 50/50 KWIKPEN (50-50) 100 UNIT/ML Kwikpen INJECT 25 UNITS INTO THE SKIN DAILY WITH BREAKFAST 15 mL 2   No current facility-administered medications for this visit.    Functional Status:  In your present state of health, do you have any difficulty performing the following activities: 04/29/2015 04/08/2015  Hearing? N N  Vision? Y N  Difficulty concentrating or making decisions? Dana [name]  Walking or climbing stairs? Y Y  Dressing or bathing? Y Y  Doing errands, shopping? Y N  Preparing Food and eating ? Y -  Using the Toilet? Y -  In the past six months, have you accidently leaked urine? Y -  Do you have problems with loss of bowel control? Y -  Managing your Medications? Y -  Managing your Finances? Y -  Housekeeping or managing your Housekeeping? Y -    Fall/Depression Screening: PHQ 2/9 Scores 04/29/2015  PHQ -  2 Score 0   Fall Risk  04/29/2015  Falls in the past year? Yes  Number falls in past yr: 1  Injury with Fall? No  Risk for fall due to : History of fall(s);Impaired mobility;Impaired vision  Follow up Education provided;Falls prevention discussed   Assessment:  69 year old with history of recent admission for pneumonia, urinary tract infection, acute encephalopathy, acute pyelonephritis. History of heart failure, dementia. Lives with husband who is the primary caregiver. Member with advanced dementia, limited self-mobility, however can turn self in bed and does communicate and respond to questions. Member is active with [facility] and physical therapy. Member is non ambulatory at this time, However Dana [name] reports  physical therapy is working with member and they are getting her up to the chair.  RNCM discussed personal care service for additional support. Dana [name] states currently he could use assistance with getting member out of the car for doctor appointments. RNCM encouraged Dana [name] to discuss this need with his children. Also discussed personal care services. Dana [name] reports he has applied for Medicaid for member, but he is not sure of the status of the application. Dana [name] with questions about hospice services and reports he has talked with his children and they think it is a good idea. RNCM discussed services. EMMI video shown  Regarding: Considering Hospice care.   Plan: Telephonic call next week. Send initial assessment to primary care physician.  THN CM Care Plan Problem One        Most Recent Value   Care Plan Problem One  at risk for readmission   Role Documenting the Problem One  Care Management [place] for Problem One  Active   THN Long Term Goal (31-90 days)  Member will not be readmitted within 30 days of discharge.   THN Long Term Goal Start Date  04/12/15   Interventions for Problem One Long Term Goal  home visit, reviewed medications, encouraged continued participation with home health agency.   THN CM Short Term Goal #1 (0-30 days)  Member will work with home health agency as scheduled within the next 30 days.   THN CM Short Term Goal #1 Start Date  04/12/15   Interventions for Short Term Goal #1  mr. Lopresti, member is still involved with home heatlh services.   THN CM Short Term Goal #2 (0-30 days)  member will take medications as prescribed within the next 30 days.Dana [name] CM Short Term Goal #2 Start Date  04/12/15   [facility] CM Short Term Goal #2 Met Date  04/29/15   Interventions for Short Term Goal #2  Per Mr. Bujak, member is taking medications without difficulty.    THN CM Care Plan Problem Two        Most Recent Value   Care Plan Problem Two  knowledge deficit  related to Hospice services.   Role Documenting the Problem Two  Care Management [place] for Problem Two  Active   THN CM Short Term Goal #1 (0-30 days)  Mr. Toomey will contact primary care to request someone from hospice discuss the program within the next 30 days.   THN CM Short Term Goal #1 Start Date  04/26/15   Interventions for Short Term Goal #2   EMMI video regarding Hospice, encouraged to follow up with provider per plan previously.       Thea Silversmith, RN, MSN, [place]  Coordinator Cell: [phone]

## 2015-05-01 ENCOUNTER — Encounter: Payer: Self-pay | Admitting: *Deleted

## 2015-05-01 NOTE — Patient Outreach (Signed)
[facility](THN) Care Management  05/01/2015  [name] 05/08/1946 [id number]   Request from Kathyrn SheriffJuana Wallace, RN to assign SW, assigned Ryland GroupJoanna Saporito, LCSW.  Thanks, Corrie MckusickLisa O. Sharlee BlewMoore, AABA [facility] Care Management [facility] CM Assistant Phone: [phone] Fax: [phone]

## 2015-05-02 DIAGNOSIS — M6281 Muscle weakness (generalized): Secondary | ICD-10-CM | POA: Diagnosis not present

## 2015-05-02 DIAGNOSIS — G4089 Other seizures: Secondary | ICD-10-CM | POA: Diagnosis not present

## 2015-05-02 DIAGNOSIS — G309 Alzheimer's disease, unspecified: Secondary | ICD-10-CM | POA: Diagnosis not present

## 2015-05-02 DIAGNOSIS — I5020 Unspecified systolic (congestive) heart failure: Secondary | ICD-10-CM | POA: Diagnosis not present

## 2015-05-02 DIAGNOSIS — F339 Major depressive disorder, recurrent, unspecified: Secondary | ICD-10-CM | POA: Diagnosis not present

## 2015-05-02 DIAGNOSIS — R55 Syncope and collapse: Secondary | ICD-10-CM | POA: Diagnosis not present

## 2015-05-02 DIAGNOSIS — E119 Type 2 diabetes mellitus without complications: Secondary | ICD-10-CM | POA: Diagnosis not present

## 2015-05-02 DIAGNOSIS — I10 Essential (primary) hypertension: Secondary | ICD-10-CM | POA: Diagnosis not present

## 2015-05-02 DIAGNOSIS — R262 Difficulty in walking, not elsewhere classified: Secondary | ICD-10-CM | POA: Diagnosis not present

## 2015-05-03 DIAGNOSIS — G4089 Other seizures: Secondary | ICD-10-CM | POA: Diagnosis not present

## 2015-05-03 DIAGNOSIS — I10 Essential (primary) hypertension: Secondary | ICD-10-CM | POA: Diagnosis not present

## 2015-05-03 DIAGNOSIS — G309 Alzheimer's disease, unspecified: Secondary | ICD-10-CM | POA: Diagnosis not present

## 2015-05-03 DIAGNOSIS — F339 Major depressive disorder, recurrent, unspecified: Secondary | ICD-10-CM | POA: Diagnosis not present

## 2015-05-03 DIAGNOSIS — M6281 Muscle weakness (generalized): Secondary | ICD-10-CM | POA: Diagnosis not present

## 2015-05-03 DIAGNOSIS — R262 Difficulty in walking, not elsewhere classified: Secondary | ICD-10-CM | POA: Diagnosis not present

## 2015-05-03 DIAGNOSIS — I5020 Unspecified systolic (congestive) heart failure: Secondary | ICD-10-CM | POA: Diagnosis not present

## 2015-05-03 DIAGNOSIS — R55 Syncope and collapse: Secondary | ICD-10-CM | POA: Diagnosis not present

## 2015-05-03 DIAGNOSIS — E119 Type 2 diabetes mellitus without complications: Secondary | ICD-10-CM | POA: Diagnosis not present

## 2015-05-04 DIAGNOSIS — R55 Syncope and collapse: Secondary | ICD-10-CM | POA: Diagnosis not present

## 2015-05-04 DIAGNOSIS — M6281 Muscle weakness (generalized): Secondary | ICD-10-CM | POA: Diagnosis not present

## 2015-05-04 DIAGNOSIS — G4089 Other seizures: Secondary | ICD-10-CM | POA: Diagnosis not present

## 2015-05-04 DIAGNOSIS — I10 Essential (primary) hypertension: Secondary | ICD-10-CM | POA: Diagnosis not present

## 2015-05-04 DIAGNOSIS — F339 Major depressive disorder, recurrent, unspecified: Secondary | ICD-10-CM | POA: Diagnosis not present

## 2015-05-04 DIAGNOSIS — I5020 Unspecified systolic (congestive) heart failure: Secondary | ICD-10-CM | POA: Diagnosis not present

## 2015-05-04 DIAGNOSIS — E119 Type 2 diabetes mellitus without complications: Secondary | ICD-10-CM | POA: Diagnosis not present

## 2015-05-04 DIAGNOSIS — G309 Alzheimer's disease, unspecified: Secondary | ICD-10-CM | POA: Diagnosis not present

## 2015-05-04 DIAGNOSIS — R262 Difficulty in walking, not elsewhere classified: Secondary | ICD-10-CM | POA: Diagnosis not present

## 2015-05-06 ENCOUNTER — Other Ambulatory Visit: Payer: Self-pay | Admitting: *Deleted

## 2015-05-06 NOTE — Patient Outreach (Signed)
[facility]) Care Management  05/06/2015  Minda MeoShirley M Bossard 02/01/1946 [id number]   CSW received a new referral on patient from patient's RNCM with [facility] Care Management, Kathyrn SheriffJuana Wallace.  According to [name], patient and husband, [name] Dana [name] would benefit from social work services and resources to assist with arranging PCS (Personal Care Services) in the home.  [name] reports that patient has been diagnosed with Advanced Dementia and is becoming harder and harder for [name] to manage in the home.  [name] is patient's primary caregiver and admits to a recent rapid decline in patient's cognitive function.  [name] reported that [name] has already applied for Adult Medicaid coverage for patient through the [place] of [employer], assuming that the application is still pending. CSW will try and make contact with patient's Medicaid Case Worker to check the status of patient's application. [name] is also considering Hospice & Palliative Care Services for patient. CSW made an initial attempt to try and contact patient today to perform phone assessment, as well as assess and assist with social needs and services, without success.  A HIPAA complaint message was left for patient on voicemail.  CSW is currently awaiting a return call.  Danford BadJoanna Nancy Manuele, BSW, MSW, LCSW  Licensed Restaurant manager, fast foodClinical Social Worker  [facility] Care Management [place] [facility]  Mailing [place], [place], [place] [address] Physical Address-[address], [place], [place] [address] Toll Free Main # [phone] Fax # [phone] Cell # [phone]  Fax # [phone]  [email or web address]

## 2015-05-07 DIAGNOSIS — I10 Essential (primary) hypertension: Secondary | ICD-10-CM | POA: Diagnosis not present

## 2015-05-07 DIAGNOSIS — M6281 Muscle weakness (generalized): Secondary | ICD-10-CM | POA: Diagnosis not present

## 2015-05-07 DIAGNOSIS — R262 Difficulty in walking, not elsewhere classified: Secondary | ICD-10-CM | POA: Diagnosis not present

## 2015-05-07 DIAGNOSIS — E119 Type 2 diabetes mellitus without complications: Secondary | ICD-10-CM | POA: Diagnosis not present

## 2015-05-07 DIAGNOSIS — F339 Major depressive disorder, recurrent, unspecified: Secondary | ICD-10-CM | POA: Diagnosis not present

## 2015-05-07 DIAGNOSIS — R55 Syncope and collapse: Secondary | ICD-10-CM | POA: Diagnosis not present

## 2015-05-07 DIAGNOSIS — G309 Alzheimer's disease, unspecified: Secondary | ICD-10-CM | POA: Diagnosis not present

## 2015-05-07 DIAGNOSIS — I5020 Unspecified systolic (congestive) heart failure: Secondary | ICD-10-CM | POA: Diagnosis not present

## 2015-05-07 DIAGNOSIS — G4089 Other seizures: Secondary | ICD-10-CM | POA: Diagnosis not present

## 2015-05-08 ENCOUNTER — Other Ambulatory Visit: Payer: Self-pay

## 2015-05-08 DIAGNOSIS — E119 Type 2 diabetes mellitus without complications: Secondary | ICD-10-CM | POA: Diagnosis not present

## 2015-05-08 DIAGNOSIS — I10 Essential (primary) hypertension: Secondary | ICD-10-CM | POA: Diagnosis not present

## 2015-05-08 DIAGNOSIS — G4089 Other seizures: Secondary | ICD-10-CM | POA: Diagnosis not present

## 2015-05-08 DIAGNOSIS — F339 Major depressive disorder, recurrent, unspecified: Secondary | ICD-10-CM | POA: Diagnosis not present

## 2015-05-08 DIAGNOSIS — I5020 Unspecified systolic (congestive) heart failure: Secondary | ICD-10-CM | POA: Diagnosis not present

## 2015-05-08 DIAGNOSIS — G309 Alzheimer's disease, unspecified: Secondary | ICD-10-CM | POA: Diagnosis not present

## 2015-05-08 DIAGNOSIS — R262 Difficulty in walking, not elsewhere classified: Secondary | ICD-10-CM | POA: Diagnosis not present

## 2015-05-08 DIAGNOSIS — R55 Syncope and collapse: Secondary | ICD-10-CM | POA: Diagnosis not present

## 2015-05-08 DIAGNOSIS — M6281 Muscle weakness (generalized): Secondary | ICD-10-CM | POA: Diagnosis not present

## 2015-05-08 NOTE — Patient Outreach (Signed)
[facility]) Care Management  05/08/2015  Minda MeoShirley M Cabreja 03/08/1946 [id number]  Assessment: Transition of care call. RNCM spoke with [name] who reports member is about the same. [name] states that home health is still involved with member. And states He has talked with primary care provider regarding hospice referral. He states that he has not been contacted by anyone from Hospice regarding their services.  Plan: RNCM will call provider office to follow up.  Kathyrn SheriffJuana Shara Hartis, RN, MSN, [facility]BSN,CCM [facility] Community Care Coordinator Cell: [phone]

## 2015-05-08 NOTE — Progress Notes (Signed)
This encounter was created in error - please disregard.

## 2015-05-09 ENCOUNTER — Telehealth: Payer: Self-pay | Admitting: Endocrinology

## 2015-05-09 NOTE — Telephone Encounter (Signed)
[name] stated that husband would like to know if patient is eligible for hospice care, please advise

## 2015-05-10 ENCOUNTER — Other Ambulatory Visit: Payer: Self-pay

## 2015-05-10 ENCOUNTER — Other Ambulatory Visit: Payer: Self-pay | Admitting: *Deleted

## 2015-05-10 NOTE — Patient Outreach (Signed)
[facility]N) Care Management  05/10/2015 for 05/08/15 1648  Dana [name] 01/13/1946 [id number]  Assessment: Call placed to primary care office regarding member's request to speak with Hospice/palliative care service. Call placed before 5 pm, however call rolled over to answering service. Unable to leave message.  Plan: will continue to follow  Dana SheriffJuana Xiadani Damman, RN, MSN, [facility] HospitalBSN,CCM [facility] Community Care Coordinator Cell: [phone]

## 2015-05-10 NOTE — Telephone Encounter (Signed)
yes

## 2015-05-10 NOTE — Patient Outreach (Signed)
[facility]N) Care Management  05/10/2015  Minda MeoShirley M Kuw[place]ara 02/28/1946 [id number]   CSW will proceed with case closure on patient due to inability to establish initial phone contact, despite required number of phone attempts made and outreach letter mailed to patient's home.  An additional phone attempt was made today, without success.  CSW will fax a correspondence letter to patient's Primary Care Physician, Dr. Romero BellingSean Ellison, as well as notify patient's RNCM with [facility] Care Management, Kathyrn SheriffJuana Wallace.  Danford BadJoanna Shatha Hooser, BSW, MSW, LCSW  Licensed Restaurant manager, fast foodClinical Social Worker  [facility] Care Management [place] [facility]  Mailing [place]-[address], [place], [place] [address] Physical Address-[address], [place], [place] [address] Toll Free Main # [phone] Fax # [phone] Cell # [phone]  Fax # [phone]  [email or web address]

## 2015-05-10 NOTE — Patient Outreach (Signed)
[facility]) Care Management  05/10/2015  Dana [name] 05/23/1946 [id number]   CSW made a second attempt to try and contact patient and patient's husband, Dana [name] today to perform phone assessment, as well as assess and assist with social work needs and services, without success.  A HIPAA compliant message was left.  CSW is currently awaiting a return call.  Dana [name], BSW, MSW, LCSW  Licensed Restaurant manager, fast foodClinical Social Worker  [facility] Care Management [place] [facility]  Mailing [place], [place], [place] [address] Physical Address-[address], [place], [place] [address] Toll Free Main # [phone] Fax # [phone] Cell # [phone]  Fax # [phone]  [name]

## 2015-05-10 NOTE — Telephone Encounter (Signed)
Please see below and advise.

## 2015-05-10 NOTE — Patient Outreach (Addendum)
[facility]) Care Management  05/09/2015  Dana [name] 01/27/1946 [id number]  Received call [facility]. RNCM spoke with Dana [name] who reports she discussed Hospice and Palliative care services with Dana [name]. Dana [name] to follow up with primary care to better assess appropriate level and member needs.   Plan: continue to follow, transition of care call next week.  Dana SheriffJuana Kenzlee [place], RN, MSN, [facility] BeevilleBSN,CCM [facility] Community Care Coordinator Cell: [phone]

## 2015-05-11 DIAGNOSIS — G309 Alzheimer's disease, unspecified: Secondary | ICD-10-CM | POA: Diagnosis not present

## 2015-05-11 DIAGNOSIS — I5042 Chronic combined systolic (congestive) and diastolic (congestive) heart failure: Secondary | ICD-10-CM | POA: Diagnosis not present

## 2015-05-11 DIAGNOSIS — I10 Essential (primary) hypertension: Secondary | ICD-10-CM | POA: Diagnosis not present

## 2015-05-11 DIAGNOSIS — I6789 Other cerebrovascular disease: Secondary | ICD-10-CM | POA: Diagnosis not present

## 2015-05-15 ENCOUNTER — Other Ambulatory Visit: Payer: Self-pay

## 2015-05-15 ENCOUNTER — Ambulatory Visit: Payer: Self-pay

## 2015-05-15 DIAGNOSIS — I10 Essential (primary) hypertension: Secondary | ICD-10-CM | POA: Diagnosis not present

## 2015-05-15 NOTE — Patient Outreach (Signed)
[facility]) Care Management  05/15/2015  Minda MeoShirley M Shoun 03/27/1946 [id number]  Assessment: RNCM called and spoke to Mr. Marye RoundWare-follow up to see if he has spoken with Hospice/Palliative care since last week regarding member's eligibility. Mr. [name] states he has not spoken with anyone since last week. Mr. [name] reports plans to call today. Mr. [name] reports that member is still active with Home Health for occupational therapy and bath aide.  Plan: RNCM will follow up with Tamela Odd[facility]Betsy [facility] [place].   Kathyrn SheriffJuana Miki Blank, RN, MSN, [facility] CenterBSN,CCM [facility] Community Care Coordinator Cell: [phone]

## 2015-05-15 NOTE — Patient Outreach (Signed)
[facility]N) Care Management  05/15/2015  Dana [name] 11/16/1945 [id number]  Assessment: Received call from member's husband reporting that he has heard from Hospice and Palliative care-[facility] will be [facility].   Plan: Follow up next week, then close case per policy and procedure.  Kathyrn SheriffJuana Amrit Erck, RN, MSN, [facility]BSN,CCM [facility] Community Care Coordinator Cell: [phone]

## 2015-05-15 NOTE — Patient Outreach (Signed)
[facility]) Care Management  05/15/2015  Dana [name] 12/29/1945 [id number]  Care Coordination: RNCM received a call from Hospice and Palliative Care, Tamela OddiBetsy, who reports that an order was received from primary care and a nurse will see member tomorrow to complete assessment.  Plan: follow up telephone call with member next week  Kathyrn SheriffJuana Shep Porter, RN, MSN, [facility]BSN,CCM [facility] Community Care Coordinator Cell: [phone]

## 2015-05-15 NOTE — Patient Outreach (Signed)
[facility]) Care Management  05/15/2015  Dana [name] 10/22/1945 [id number]  Care coordination: RNCM called and spoke with [facility] to obtain update. Tamela OddiBetsy report she has called primary care office and was waiting on a return response. Tamela OddiBetsy reports she will follow up with primary care office today, then call RNCM with update.  Plan: Continue to follow.  Kathyrn SheriffJuana Rashunda Passon, RN, MSN, [facility]pt[facility] MilwaukeeBSN,CCM [facility] Community Care Coordinator Cell: [phone]

## 2015-05-16 DIAGNOSIS — M6281 Muscle weakness (generalized): Secondary | ICD-10-CM | POA: Diagnosis not present

## 2015-05-16 DIAGNOSIS — E119 Type 2 diabetes mellitus without complications: Secondary | ICD-10-CM | POA: Diagnosis not present

## 2015-05-16 DIAGNOSIS — G309 Alzheimer's disease, unspecified: Secondary | ICD-10-CM | POA: Diagnosis not present

## 2015-05-16 DIAGNOSIS — F339 Major depressive disorder, recurrent, unspecified: Secondary | ICD-10-CM | POA: Diagnosis not present

## 2015-05-16 DIAGNOSIS — R55 Syncope and collapse: Secondary | ICD-10-CM | POA: Diagnosis not present

## 2015-05-16 DIAGNOSIS — I10 Essential (primary) hypertension: Secondary | ICD-10-CM | POA: Diagnosis not present

## 2015-05-16 DIAGNOSIS — R262 Difficulty in walking, not elsewhere classified: Secondary | ICD-10-CM | POA: Diagnosis not present

## 2015-05-16 DIAGNOSIS — I5020 Unspecified systolic (congestive) heart failure: Secondary | ICD-10-CM | POA: Diagnosis not present

## 2015-05-16 DIAGNOSIS — G4089 Other seizures: Secondary | ICD-10-CM | POA: Diagnosis not present

## 2015-05-19 ENCOUNTER — Telehealth: Payer: Self-pay | Admitting: Endocrinology

## 2015-05-19 NOTE — Telephone Encounter (Signed)
please call patient: Ov is due 

## 2015-05-20 ENCOUNTER — Other Ambulatory Visit: Payer: Self-pay

## 2015-05-20 NOTE — Patient Outreach (Signed)
[facility]) Care Management  05/20/2015  Minda MeoShirley M Shimabukuro 08/17/1945 [id number]  Assessment: RNCM called and spoke with Mr. [name] who reports that Hospice nurse has been to see member and she not able to get all the services, but member is eligible for the nurse to come out. And Hospice nurse to follow up with member this week.  Plan: RNCM will follow up with hospice regarding Plan/start of services. Then follow up with member by the end of the week.  Kathyrn SheriffJuana Masae Lukacs, RN, MSN, [facility] HospitalBSN,CCM [facility] Community Care Coordinator Cell: [phone]

## 2015-05-21 ENCOUNTER — Telehealth: Payer: Self-pay | Admitting: Endocrinology

## 2015-05-21 NOTE — Telephone Encounter (Signed)
Patient and pt's husband notified appointment is due.

## 2015-05-21 NOTE — Telephone Encounter (Signed)
Jasmine DecemberSharon is calling to advise that she is discharging a patient. She called our office in error, and insisted that i send the message. She states that the daughter is in the process of signing the patient up for hospice.

## 2015-05-22 ENCOUNTER — Encounter (HOSPITAL_COMMUNITY): Payer: Self-pay

## 2015-05-22 ENCOUNTER — Emergency Department (HOSPITAL_COMMUNITY): Payer: Commercial Managed Care - HMO

## 2015-05-22 ENCOUNTER — Observation Stay (HOSPITAL_COMMUNITY)
Admission: EM | Admit: 2015-05-22 | Discharge: 2015-05-24 | Disposition: A | Discharge status: Home / Self Care | Payer: Commercial Managed Care - HMO | Attending: Internal Medicine | Admitting: Internal Medicine

## 2015-05-22 DIAGNOSIS — Z886 Allergy status to analgesic agent status: Secondary | ICD-10-CM | POA: Diagnosis not present

## 2015-05-22 DIAGNOSIS — F0390 Unspecified dementia without behavioral disturbance: Secondary | ICD-10-CM | POA: Insufficient documentation

## 2015-05-22 DIAGNOSIS — E162 Hypoglycemia, unspecified: Secondary | ICD-10-CM | POA: Diagnosis present

## 2015-05-22 DIAGNOSIS — Z794 Long term (current) use of insulin: Secondary | ICD-10-CM | POA: Diagnosis not present

## 2015-05-22 DIAGNOSIS — F03C0 Unspecified dementia, severe, without behavioral disturbance, psychotic disturbance, mood disturbance, and anxiety: Secondary | ICD-10-CM | POA: Diagnosis present

## 2015-05-22 DIAGNOSIS — M1990 Unspecified osteoarthritis, unspecified site: Secondary | ICD-10-CM | POA: Insufficient documentation

## 2015-05-22 DIAGNOSIS — I420 Dilated cardiomyopathy: Secondary | ICD-10-CM | POA: Diagnosis not present

## 2015-05-22 DIAGNOSIS — Z66 Do not resuscitate: Secondary | ICD-10-CM | POA: Diagnosis not present

## 2015-05-22 DIAGNOSIS — Z79899 Other long term (current) drug therapy: Secondary | ICD-10-CM | POA: Insufficient documentation

## 2015-05-22 DIAGNOSIS — R569 Unspecified convulsions: Secondary | ICD-10-CM | POA: Diagnosis not present

## 2015-05-22 DIAGNOSIS — I110 Hypertensive heart disease with heart failure: Secondary | ICD-10-CM | POA: Diagnosis not present

## 2015-05-22 DIAGNOSIS — Z885 Allergy status to narcotic agent status: Secondary | ICD-10-CM | POA: Insufficient documentation

## 2015-05-22 DIAGNOSIS — E785 Hyperlipidemia, unspecified: Secondary | ICD-10-CM | POA: Insufficient documentation

## 2015-05-22 DIAGNOSIS — Z7982 Long term (current) use of aspirin: Secondary | ICD-10-CM | POA: Insufficient documentation

## 2015-05-22 DIAGNOSIS — E11319 Type 2 diabetes mellitus with unspecified diabetic retinopathy without macular edema: Secondary | ICD-10-CM | POA: Diagnosis not present

## 2015-05-22 DIAGNOSIS — G40909 Epilepsy, unspecified, not intractable, without status epilepticus: Secondary | ICD-10-CM | POA: Insufficient documentation

## 2015-05-22 DIAGNOSIS — F329 Major depressive disorder, single episode, unspecified: Secondary | ICD-10-CM | POA: Insufficient documentation

## 2015-05-22 DIAGNOSIS — D649 Anemia, unspecified: Secondary | ICD-10-CM | POA: Diagnosis not present

## 2015-05-22 DIAGNOSIS — I5042 Chronic combined systolic (congestive) and diastolic (congestive) heart failure: Secondary | ICD-10-CM | POA: Diagnosis not present

## 2015-05-22 DIAGNOSIS — E7800 Pure hypercholesterolemia, unspecified: Secondary | ICD-10-CM | POA: Insufficient documentation

## 2015-05-22 DIAGNOSIS — I10 Essential (primary) hypertension: Secondary | ICD-10-CM | POA: Diagnosis present

## 2015-05-22 DIAGNOSIS — E11649 Type 2 diabetes mellitus with hypoglycemia without coma: Principal | ICD-10-CM | POA: Insufficient documentation

## 2015-05-22 LAB — URINE MICROSCOPIC-ADD ON

## 2015-05-22 LAB — CBC WITH DIFFERENTIAL/PLATELET
BASOS ABS: 0.1 10*3/uL (ref 0.0–0.1)
BASOS PCT: 1 %
EOS ABS: 0.3 10*3/uL (ref 0.0–0.7)
EOS PCT: 3 %
HEMATOCRIT: 33 % — AB (ref 36.0–46.0)
Hemoglobin: 10.6 g/dL — ABNORMAL LOW (ref 12.0–15.0)
Lymphocytes Relative: 23 %
Lymphs Abs: 1.9 10*3/uL (ref 0.7–4.0)
MCH: 26.7 pg (ref 26.0–34.0)
MCHC: 32.1 g/dL (ref 30.0–36.0)
MCV: 83.1 fL (ref 78.0–100.0)
MONO ABS: 0.5 10*3/uL (ref 0.1–1.0)
MONOS PCT: 6 %
Neutro Abs: 5.5 10*3/uL (ref 1.7–7.7)
Neutrophils Relative %: 67 %
PLATELETS: 246 10*3/uL (ref 150–400)
RBC: 3.97 MIL/uL (ref 3.87–5.11)
RDW: 16.3 % — AB (ref 11.5–15.5)
WBC: 8.2 10*3/uL (ref 4.0–10.5)

## 2015-05-22 LAB — COMPREHENSIVE METABOLIC PANEL
ALBUMIN: 3.4 g/dL — AB (ref 3.5–5.0)
ALT: 16 U/L (ref 14–54)
AST: 19 U/L (ref 15–41)
Alkaline Phosphatase: 69 U/L (ref 38–126)
Anion gap: 8 (ref 5–15)
BILIRUBIN TOTAL: 0.8 mg/dL (ref 0.3–1.2)
BUN: 12 mg/dL (ref 6–20)
CHLORIDE: 102 mmol/L (ref 101–111)
CO2: 31 mmol/L (ref 22–32)
CREATININE: 1.03 mg/dL — AB (ref 0.44–1.00)
Calcium: 9.4 mg/dL (ref 8.9–10.3)
GFR calc Af Amer: >60 mL/min (ref >60)
GFR, EST NON AFRICAN AMERICAN: 53 mL/min — AB (ref >60)
GLUCOSE: 59 mg/dL — AB (ref 65–99)
POTASSIUM: 3.5 mmol/L (ref 3.5–5.1)
Sodium: 141 mmol/L (ref 135–145)
Total Protein: 7.2 g/dL (ref 6.5–8.1)

## 2015-05-22 LAB — CBG MONITORING, ED
GLUCOSE-CAPILLARY: 48 mg/dL — AB (ref 65–99)
GLUCOSE-CAPILLARY: 87 mg/dL (ref 65–99)
GLUCOSE-CAPILLARY: 45 mg/dL — AB (ref 65–99)
Glucose-Capillary: 64 mg/dL — ABNORMAL LOW (ref 65–99)

## 2015-05-22 LAB — URINALYSIS, ROUTINE W REFLEX MICROSCOPIC
Bilirubin Urine: NEGATIVE
GLUCOSE, UA: 100 mg/dL — AB
Ketones, ur: NEGATIVE mg/dL
Nitrite: NEGATIVE
PH: 6 (ref 5.0–8.0)
Protein, ur: NEGATIVE mg/dL
Specific Gravity, Urine: 1.008 (ref 1.005–1.030)
Urobilinogen, UA: 1 mg/dL (ref 0.0–1.0)

## 2015-05-22 LAB — VALPROIC ACID LEVEL: VALPROIC ACID LVL: 30 ug/mL — AB (ref 50.0–100.0)

## 2015-05-22 LAB — I-STAT TROPONIN, ED: TROPONIN I, POC: 0 ng/mL (ref 0.00–0.08)

## 2015-05-22 MED ORDER — DEXTROSE 50 % IV SOLN
25.0000 g | Freq: Once | INTRAVENOUS | Status: AC
  Administered 2015-05-22: 25 g via INTRAVENOUS
  Filled 2015-05-22: qty 50

## 2015-05-22 MED ORDER — KCL IN DEXTROSE-NACL 20-5-0.45 MEQ/L-%-% IV SOLN
Freq: Once | INTRAVENOUS | Status: AC
  Administered 2015-05-23: 01:00:00 via INTRAVENOUS
  Filled 2015-05-22: qty 1000

## 2015-05-22 MED ORDER — VALPROATE SODIUM 500 MG/5ML IV SOLN
1000.0000 mg | Freq: Once | INTRAVENOUS | Status: AC
  Administered 2015-05-22: 1000 mg via INTRAVENOUS
  Filled 2015-05-22: qty 10

## 2015-05-22 MED ORDER — DEXTROSE 50 % IV SOLN: 25.0000 g | Freq: Once | INTRAVENOUS | Status: DC

## 2015-05-22 NOTE — ED Notes (Signed)
Pt CBG 45; MD notified.

## 2015-05-22 NOTE — ED Notes (Signed)
Pt given orange juice.

## 2015-05-22 NOTE — ED Notes (Signed)
Port xray at bedside.  

## 2015-05-22 NOTE — ED Notes (Signed)
Seizure pads in place. Pt remains monitored by blood pressure, pulse ox, and 12 lead.

## 2015-05-22 NOTE — ED Provider Notes (Signed)
CSN: [id number]     Arrival date & time 05/22/15  1821 History   First MD Initiated Contact with Patient 05/22/15 1833     Chief Complaint  Patient presents with  . Seizures     (Consider location/radiation/quality/duration/timing/severity/associated sxs/prior Treatment) Patient is a 69 y.o. female presenting with seizures. The history is provided by the spouse and the EMS personnel. The history is limited by the condition of the patient.  Seizures Seizure activity on arrival: no   Seizure type:  Unable to specify Initial focality:  Unable to specify Episode characteristics: abnormal movements, confusion, disorientation, generalized shaking, stiffening and unresponsiveness   Postictal symptoms: confusion   Return to baseline: yes   Severity:  Moderate Duration:  4 minutes Timing:  Once Number of seizures this episode:  1 Progression:  Unchanged Context: possible hypoglycemia   Context: not change in medication, not fever and not previous head injury   Recent head injury:  No recent head injuries PTA treatment:  None History of seizures: yes     Past Medical History  Diagnosis Date  . ALLERGIC RHINITIS 02/08/2007  . ANXIETY 02/08/2007  . DEPRESSION 02/08/2007  . DIABETES MELLITUS, TYPE I 02/08/2007  . Headache(784.0) 11/06/2008  . HYPERCHOLESTEROLEMIA 11/05/2008  . HYPERTENSION 02/08/2007  . URINARY INCONTINENCE 02/08/2007  . Dyslipidemia   . Non-ulcer dyspepsia   . Leukopenia   . DM retinopathy (HCC)     background  . 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 Dementia   . CHF (congestive heart failure) (HCC)   . Anemia     takes iron  . Skin abnormality     both knees with opne ares and left shoulder with open areas, changes dressings prn   Past Surgical History  Procedure Laterality Date  . Total knee arthroplasty  1990    left  . Panendoscopy  10/27/1999  . Stress cardiolite  12/14/2003  . Electrocardiogram  05/17/2006  . Abdominal hysterectomy    .  Bladder tach  years ago  . Colonoscopy with propofol N/A 06/21/2014    Procedure: COLONOSCOPY WITH PROPOFOL;  Surgeon: Rachael Fee, MD;  Location: [facility];  Service: Endoscopy;  Laterality: N/A;   Family History  Problem Relation Age of Onset  . Alcohol abuse Son   . Cancer Neg Hx     no cancer in immediate family  . CAD Father   . Colon cancer Neg Hx   . Colon polyps Neg Hx   . Diabetes Father   . Kidney disease Neg Hx   . Esophageal cancer Neg Hx   . Gallbladder disease Neg Hx    Social History  Substance Use Topics  . Smoking status: Never Smoker   . Smokeless tobacco: Never Used  . Alcohol Use: No   OB History    No data available     Review of Systems  Unable to perform ROS: Mental status change  Constitutional: Positive for appetite change.  Neurological: Positive for seizures.  Psychiatric/Behavioral: Positive for confusion.      Allergies  Aspirin and Codeine  Home Medications   Prior to Admission medications   Medication Sig Start Date End Date Taking? Authorizing Provider  aspirin EC 325 MG tablet TAKE 1 TABLET (325 MG TOTAL) BY MOUTH DAILY. 03/04/15  Yes Kathleene Hazel, MD  atorvastatin (LIPITOR) 20 MG tablet TAKE 1 TABLET BY MOUTH EVERY DAY 04/01/15  Yes Romero Belling, MD  carvedilol (COREG) 3.125 MG tablet Take  1 tablet (3.125 mg total) by mouth 2 (two) times daily with a meal. Patient taking differently: Take 3.125 mg by mouth daily.  07/17/14  Yes Kathleene Hazel, MD  citalopram (CELEXA) 20 MG tablet TAKE 1 TABLET (20 MG TOTAL) BY MOUTH DAILY. 10/29/14  Yes Romero Belling, MD  divalproex (DEPAKOTE ER) 250 MG 24 hr tablet Take 250 mg by mouth every morning.  07/09/14  Yes Historical Provider, MD  donepezil (ARICEPT) 10 MG tablet Take 10 mg by mouth at bedtime.   Yes Historical Provider, MD  furosemide (LASIX) 40 MG tablet Take 40 mg by mouth daily.    Yes Historical Provider, MD  HUMALOG MIX 50/50 KWIKPEN (50-50) 100 UNIT/ML Kwikpen  INJECT 25 UNITS INTO THE SKIN DAILY WITH BREAKFAST 04/29/15  Yes Romero Belling, MD  hyoscyamine (LEVSIN SL) 0.125 MG SL tablet PLACE 1 TABLET UNDER THE TONGUE TWICE DAILY AS NEEDED FOR CRAMPS AND SPASMS. 04/01/15  Yes Lori P Hvozdovic, PA-C  lisinopril (PRINIVIL,ZESTRIL) 2.5 MG tablet TAKE 1 TABLET (2.5 MG TOTAL) BY MOUTH EVERY MORNING. 03/11/15  Yes Kathleene Hazel, MD  loperamide (IMODIUM) 2 MG capsule Take 1 capsule (2 mg total) by mouth as needed for diarrhea or loose stools. 04/11/15  Yes Hollice Espy, MD  memantine (NAMENDA XR) 28 MG CP24 24 hr capsule Take 28 mg by mouth at bedtime.    Yes Historical Provider, MD  Propylene Glycol (SYSTANE BALANCE OP) Apply 1 drop to eye 3 (three) times daily as needed (dry eyes).   Yes Historical Provider, MD  sodium chloride (OCEAN) 0.65 % SOLN nasal spray Place 2 sprays into both nostrils as needed for congestion.   Yes Historical Provider, MD  feeding supplement (BOOST / RESOURCE BREEZE) LIQD Take 1 Container by mouth 2 (two) times daily between meals. Patient not taking: Reported on 04/29/2015 04/11/15   Hollice Espy, MD  Insulin Lispro Prot & Lispro (HUMALOG MIX 50/50 KWIKPEN) (50-50) 100 UNIT/ML Kwikpen Inject 25 Units into the skin daily with breakfast. Patient not taking: Reported on 05/22/2015 03/20/15   Romero Belling, MD   BP 156/68 mmHg  Pulse 74  Temp(Src) 98.4 F (36.9 C) (Oral)  Resp 16  SpO2 100% Physical Exam  Constitutional: She appears well-developed and well-nourished. No distress.  HENT:  Head: Normocephalic and atraumatic.  Right Ear: External ear normal.  Left Ear: External ear normal.  Nose: Nose normal.  Mouth/Throat: Oropharynx is clear and moist. No oropharyngeal exudate.  Eyes: Conjunctivae and EOM are normal. Pupils are equal, round, and reactive to light. Right eye exhibits no discharge. Left eye exhibits no discharge. No scleral icterus.  Neck: Normal range of motion. Neck supple. No JVD present. No tracheal  deviation present. No thyromegaly present.  Cardiovascular: Normal rate, regular rhythm and intact distal pulses.   Pulmonary/Chest: Effort normal and breath sounds normal. No stridor. No respiratory distress. She has no wheezes. She has no rales. She exhibits no tenderness.  Abdominal: Soft. She exhibits no distension. There is no tenderness.  Musculoskeletal: Normal range of motion. She exhibits no edema or tenderness.  Lymphadenopathy:    She has no cervical adenopathy.  Neurological: She is alert. She has normal strength. She is disoriented. She displays atrophy. She displays no tremor. No cranial nerve deficit or sensory deficit. She exhibits normal muscle tone. She displays no seizure activity. GCS eye subscore is 4. GCS verbal subscore is 4. GCS motor subscore is 6.  Skin: Skin is warm and dry. No rash  noted. She is not diaphoretic. No erythema. No pallor.  Psychiatric: Her affect is inappropriate. Her speech is tangential. She is withdrawn. Cognition and memory are impaired.  Nursing note and vitals reviewed.   ED Course  Procedures (including critical care time) Labs Review Labs Reviewed  CBC WITH DIFFERENTIAL/PLATELET - Abnormal; Notable for the following:    Hemoglobin 10.6 (*)    HCT 33.0 (*)    RDW 16.3 (*)    All other components within normal limits  COMPREHENSIVE METABOLIC PANEL - Abnormal; Notable for the following:    Glucose, Bld 59 (*)    Creatinine, Ser 1.03 (*)    Albumin 3.4 (*)    GFR calc non Af Amer 53 (*)    All other components within normal limits  URINALYSIS, ROUTINE W REFLEX MICROSCOPIC (NOT AT [facility]) - Abnormal; Notable for the following:    Glucose, UA 100 (*)    Hgb urine dipstick TRACE (*)    Leukocytes, UA SMALL (*)    All other components within normal limits  VALPROIC ACID LEVEL - Abnormal; Notable for the following:    Valproic Acid Lvl 30 (*)    All other components within normal limits  URINE MICROSCOPIC-ADD ON - Abnormal; Notable for the  following:    Casts HYALINE CASTS (*)    All other components within normal limits  BASIC METABOLIC PANEL - Abnormal; Notable for the following:    Chloride 100 (*)    Glucose, Bld 190 (*)    Calcium 8.8 (*)    All other components within normal limits  CBC - Abnormal; Notable for the following:    RBC 3.63 (*)    Hemoglobin 9.9 (*)    HCT 30.1 (*)    RDW 16.4 (*)    All other components within normal limits  GLUCOSE, CAPILLARY - Abnormal; Notable for the following:    Glucose-Capillary 232 (*)    All other components within normal limits  GLUCOSE, CAPILLARY - Abnormal; Notable for the following:    Glucose-Capillary 196 (*)    All other components within normal limits  GLUCOSE, CAPILLARY - Abnormal; Notable for the following:    Glucose-Capillary 199 (*)    All other components within normal limits  GLUCOSE, CAPILLARY - Abnormal; Notable for the following:    Glucose-Capillary 158 (*)    All other components within normal limits  GLUCOSE, CAPILLARY - Abnormal; Notable for the following:    Glucose-Capillary 186 (*)    All other components within normal limits  GLUCOSE, CAPILLARY - Abnormal; Notable for the following:    Glucose-Capillary 257 (*)    All other components within normal limits  CBG MONITORING, ED - Abnormal; Notable for the following:    Glucose-Capillary 48 (*)    All other components within normal limits  CBG MONITORING, ED - Abnormal; Notable for the following:    Glucose-Capillary 64 (*)    All other components within normal limits  CBG MONITORING, ED - Abnormal; Notable for the following:    Glucose-Capillary 45 (*)    All other components within normal limits  HEMOGLOBIN A1C  BASIC METABOLIC PANEL  VALPROIC ACID LEVEL  I-STAT TROPOININ, ED  CBG MONITORING, ED    Imaging Review Dg Chest Portable 1 View  05/22/2015  CLINICAL DATA:  4 minutes seizures today. History of hypertension. History of CHF. EXAM: PORTABLE CHEST 1 VIEW COMPARISON:  04/07/2015  FINDINGS: Mild cardiac enlargement without vascular congestion. No focal airspace disease or consolidation in the  lungs. No blunting of costophrenic angles. No pneumothorax. Calcified and tortuous aorta. Mediastinal contours appear intact. Degenerative changes in the spine and shoulders. IMPRESSION: Mild cardiac enlargement.  No evidence of active pulmonary disease. Electronically Signed   By: William  StBurman NievesOn: 05/22/2015 20:38   I have personally reviewed and evaluated these images and lab results as part of my medical decision-making.   EKG Interpretation   Date/Time:  Wednesday May 22 2015 20:00:03 EDT Ventricular Rate:  102 PR Interval:  156 QRS Duration: 105 QT Interval:  437 QTC Calculation: 569 R Axis:   23 Text Interpretation:  Sinus or ectopic atrial tachycardia Multiform  ventricular premature complexes Low voltage, extremity and precordial  leads Abnormal R-wave progression, early transition Consider anterior  infarct Nonspecific T abnormalities, lateral leads Prolonged QT interval  Baseline wander in lead(s) V2 ED PHYSICIAN INTERPRETATION AVAILABLE IN  [facility] Confirmed by TEST, Record ([id number]) on 05/23/2015 7:12:54 AM      MDM   Final diagnoses:  None    Per patient's spell she had a seizure prior to arrival. Did not return to baseline. Has a history of dementia. Patient also recently has had low blood sugars. Patient's oral intake has been reduced, likely contributing to her hypoglycemia. Patient's spouse endorses that she has been taking her medications otherwise. Patient mental status returned to baseline however she did have low blood sugars in the ED she was refractory to D50 and which she received initially. Patient's family concerned about recurrent hyperglycemia and how to address at home. Patient's valproic acid level is low. She was given a load of valproic acid. Patient was discussed with medicine for admission for glucose monitoring and  insulin regimen adjustment. No other acute findings on workup to explain patient's symptoms. No acute interventions indicated at this time. Patient was admitted to medicine for further management.  Patient care was discussed with my attending, Dr. Jeraldine Loots.   Gavin Pound, MD 05/23/15 2248  Gerhard Munch, MD 05/27/15 [phone]

## 2015-05-22 NOTE — ED Notes (Signed)
Pt. Presents with complaint of generalized full body seizure lasting approx 4 min. Positive hx of sz and dementia. Pt. Found to be diaphoretic.Spouse states she is more confused then normal.

## 2015-05-23 DIAGNOSIS — I10 Essential (primary) hypertension: Secondary | ICD-10-CM | POA: Diagnosis not present

## 2015-05-23 DIAGNOSIS — I5042 Chronic combined systolic (congestive) and diastolic (congestive) heart failure: Secondary | ICD-10-CM | POA: Diagnosis not present

## 2015-05-23 DIAGNOSIS — G40909 Epilepsy, unspecified, not intractable, without status epilepticus: Secondary | ICD-10-CM

## 2015-05-23 DIAGNOSIS — F0390 Unspecified dementia without behavioral disturbance: Secondary | ICD-10-CM

## 2015-05-23 DIAGNOSIS — E11319 Type 2 diabetes mellitus with unspecified diabetic retinopathy without macular edema: Secondary | ICD-10-CM | POA: Diagnosis not present

## 2015-05-23 DIAGNOSIS — D649 Anemia, unspecified: Secondary | ICD-10-CM | POA: Diagnosis not present

## 2015-05-23 DIAGNOSIS — F329 Major depressive disorder, single episode, unspecified: Secondary | ICD-10-CM | POA: Diagnosis not present

## 2015-05-23 DIAGNOSIS — E162 Hypoglycemia, unspecified: Secondary | ICD-10-CM

## 2015-05-23 DIAGNOSIS — I110 Hypertensive heart disease with heart failure: Secondary | ICD-10-CM | POA: Diagnosis not present

## 2015-05-23 DIAGNOSIS — I420 Dilated cardiomyopathy: Secondary | ICD-10-CM

## 2015-05-23 DIAGNOSIS — E11649 Type 2 diabetes mellitus with hypoglycemia without coma: Secondary | ICD-10-CM | POA: Diagnosis not present

## 2015-05-23 LAB — BASIC METABOLIC PANEL
Anion gap: 10 (ref 5–15)
BUN: 14 mg/dL (ref 6–20)
CALCIUM: 8.8 mg/dL — AB (ref 8.9–10.3)
CHLORIDE: 100 mmol/L — AB (ref 101–111)
CO2: 29 mmol/L (ref 22–32)
Creatinine, Ser: 0.91 mg/dL (ref 0.44–1.00)
GFR calc non Af Amer: >60 mL/min (ref >60)
Glucose, Bld: 190 mg/dL — ABNORMAL HIGH (ref 65–99)
POTASSIUM: 3.8 mmol/L (ref 3.5–5.1)
SODIUM: 139 mmol/L (ref 135–145)

## 2015-05-23 LAB — GLUCOSE, CAPILLARY
GLUCOSE-CAPILLARY: 158 mg/dL — AB (ref 65–99)
GLUCOSE-CAPILLARY: 257 mg/dL — AB (ref 65–99)
Glucose-Capillary: 232 mg/dL — ABNORMAL HIGH (ref 65–99)
Glucose-Capillary: 196 mg/dL — ABNORMAL HIGH (ref 65–99)
Glucose-Capillary: 199 mg/dL — ABNORMAL HIGH (ref 65–99)
Glucose-Capillary: 186 mg/dL — ABNORMAL HIGH (ref 65–99)

## 2015-05-23 LAB — CBC
HCT: 30.1 % — ABNORMAL LOW (ref 36.0–46.0)
Hemoglobin: 9.9 g/dL — ABNORMAL LOW (ref 12.0–15.0)
MCH: 27.3 pg (ref 26.0–34.0)
MCHC: 32.9 g/dL (ref 30.0–36.0)
MCV: 82.9 fL (ref 78.0–100.0)
Platelets: 224 10*3/uL (ref 150–400)
RBC: 3.63 MIL/uL — ABNORMAL LOW (ref 3.87–5.11)
RDW: 16.4 % — ABNORMAL HIGH (ref 11.5–15.5)
WBC: 6.8 10*3/uL (ref 4.0–10.5)

## 2015-05-23 MED ORDER — GLUCERNA SHAKE PO LIQD
237.0000 mL | Freq: Two times a day (BID) | ORAL | Status: DC
  Administered 2015-05-23 – 2015-05-24 (×2): 237 mL via ORAL

## 2015-05-23 MED ORDER — ATORVASTATIN CALCIUM 20 MG PO TABS: 20.0000 mg | ORAL_TABLET | Freq: Every day | ORAL | Status: DC

## 2015-05-23 MED ORDER — BOOST / RESOURCE BREEZE PO LIQD
1.0000 | Freq: Every day | ORAL | Status: DC
  Administered 2015-05-23: 1 via ORAL

## 2015-05-23 MED ORDER — LOPERAMIDE HCL 2 MG PO CAPS: 2.0000 mg | ORAL_CAPSULE | ORAL | Status: DC | PRN

## 2015-05-23 MED ORDER — MEMANTINE HCL ER 28 MG PO CP24
28.0000 mg | ORAL_CAPSULE | Freq: Every day | ORAL | Status: DC
  Administered 2015-05-23 (×2): 28 mg via ORAL
  Filled 2015-05-23 (×3): qty 1

## 2015-05-23 MED ORDER — ATORVASTATIN CALCIUM 20 MG PO TABS
20.0000 mg | ORAL_TABLET | Freq: Every day | ORAL | Status: DC
  Administered 2015-05-23 – 2015-05-24 (×2): 20 mg via ORAL
  Filled 2015-05-23 (×2): qty 1

## 2015-05-23 MED ORDER — CARVEDILOL 3.125 MG PO TABS
3.1250 mg | ORAL_TABLET | Freq: Once | ORAL | Status: AC
  Administered 2015-05-23: 3.125 mg via ORAL
  Filled 2015-05-23: qty 1

## 2015-05-23 MED ORDER — SODIUM CHLORIDE 0.9 % IJ SOLN
3.0000 mL | Freq: Two times a day (BID) | INTRAMUSCULAR | Status: DC
Start: 2015-05-23 — End: 2015-05-24
  Administered 2015-05-23 – 2015-05-24 (×4): 3 mL via INTRAVENOUS

## 2015-05-23 MED ORDER — FUROSEMIDE 20 MG PO TABS
20.0000 mg | ORAL_TABLET | Freq: Every day | ORAL | Status: DC
  Administered 2015-05-23 – 2015-05-24 (×2): 20 mg via ORAL
  Filled 2015-05-23 (×2): qty 1

## 2015-05-23 MED ORDER — FUROSEMIDE 40 MG PO TABS: 40.0000 mg | ORAL_TABLET | Freq: Every day | ORAL | Status: DC

## 2015-05-23 MED ORDER — HYDRALAZINE HCL 20 MG/ML IJ SOLN
10.0000 mg | Freq: Once | INTRAMUSCULAR | Status: AC
  Administered 2015-05-23: 10 mg via INTRAVENOUS
  Filled 2015-05-23: qty 1

## 2015-05-23 MED ORDER — DIVALPROEX SODIUM ER 250 MG PO TB24
250.0000 mg | ORAL_TABLET | Freq: Every morning | ORAL | Status: DC
  Administered 2015-05-23: 250 mg via ORAL
  Filled 2015-05-23 (×2): qty 1

## 2015-05-23 MED ORDER — DONEPEZIL HCL 10 MG PO TABS
10.0000 mg | ORAL_TABLET | Freq: Every day | ORAL | Status: DC
  Administered 2015-05-23 (×2): 10 mg via ORAL
  Filled 2015-05-23 (×2): qty 1

## 2015-05-23 MED ORDER — SALINE SPRAY 0.65 % NA SOLN: 2.0000 | NASAL | Status: DC | PRN

## 2015-05-23 MED ORDER — CARVEDILOL 3.125 MG PO TABS
3.1250 mg | ORAL_TABLET | Freq: Two times a day (BID) | ORAL | Status: DC
  Administered 2015-05-23 – 2015-05-24 (×4): 3.125 mg via ORAL
  Filled 2015-05-23 (×4): qty 1

## 2015-05-23 NOTE — Progress Notes (Signed)
PT Cancellation Note  Patient Details Name: Dana [name] MRN: [id number] DOB: 10/26/1945   Cancelled Treatment:    Reason Eval/Treat Not Completed: Patient declined, no reason specified. Patient unwilling to participate in PT session despite encouragement. Will follow as able.    Christiane HaBenjamin J. Ranulfo Kall, PT, CSCS Pager [phone] Office [phone]  05/23/2015, 9:34 AM

## 2015-05-23 NOTE — Care Management Obs Status (Signed)
MEDICARE OBSERVATION STATUS NOTIFICATION   Patient Details  Name: Dana [name] MRN: [id number] Date of Birth: 12/18/1945   Medicare Observation Status Notification Given:  Yes    [name], Winona Sison Clinton, RN 05/23/2015, 3:00 PM

## 2015-05-23 NOTE — Evaluation (Signed)
Occupational Therapy Evaluation Patient Details Name: Dana [name] MRN: [id number] DOB: 1946/01/27 Today's Date: 05/23/2015    History of Present Illness 69 yo female h/o IDDM, adv dementia bed bound, not eating consistently lives with husband. All history from husband. No fevers. No n/v/d. No swelling. Pt glucose dropped today in the 40s and she had a seizure per husband, with known h/o seizure disorder.   Clinical Impression   Pt admitted with above. She demonstrates the below listed deficits and will benefit from continued OT to maximize safety and independence with BADLs.  Pt presents to OT with significant cognitive deficit due to dementia, generalized weakness, and decreased balance.  She currently, requires max - total A overall for ADLs.  She was able to feed herself with min - mod A, but is very distractable requiring max cues.  She stood x 1 with mod A, but unable to maintain standing.  Second attempt to stand, she was unable with total A.    She will likely require SNF at discharge.       Follow Up Recommendations  SNF    Equipment Recommendations  None recommended by OT    Recommendations for Other Services       Precautions / Restrictions Precautions Precautions: Fall Precaution Comments: h/o advanced dementia      Mobility Bed Mobility Overal bed mobility: Needs Assistance Bed Mobility: Supine to Sit;Sit to Supine     Supine to sit: Max assist Sit to supine: Max assist   General bed mobility comments: Pt will attempt to initiate movement in and out of bed, but becomes distracted then can't reinitiate  Transfers Overall transfer level: Needs assistance Equipment used: 1 person hand held assist Transfers: Sit to/from Stand Sit to Stand: Mod assist;Total assist         General transfer comment: first attempt to stand, pt required mod A but unable to maintain standing.  Second attempt, pt unable to move to standing despite max A and elevated seat  height     Balance Overall balance assessment: Needs assistance Sitting-balance support: Feet supported Sitting balance-Leahy Scale: Fair     Standing balance support: Bilateral upper extremity supported Standing balance-Leahy Scale: Poor                              ADL Overall ADL's : Needs assistance/impaired Eating/Feeding: Minimal assistance;Moderate assistance Eating/Feeding Details (indicate cue type and reason): Pt requires increased time.  She is able to appropriately use utensils, but will revert to using fingers, then revert back to utensils.  She demonstrates difficulty scooping food onto utensil, almost as if she doesn't see it - likely due to impaired attention  Grooming: Wash/dry hands;Wash/dry face;Supervision/safety;Minimal assistance;Bed level   Upper Body Bathing: Total assistance;Bed level;Sitting   Lower Body Bathing: Total assistance;Bed level   Upper Body Dressing : Total assistance;Bed level;Sitting   Lower Body Dressing: Total assistance;Bed level   Toilet Transfer: Total assistance Toilet Transfer Details (indicate cue type and reason): unable to attempt with +1 assist Toileting- Clothing Manipulation and Hygiene: Total assistance;Bed level       Functional mobility during ADLs: Maximal assistance General ADL Comments: Pt unable to consistently engage in ADL tasks due to cognitive deficits     Vision Additional Comments: Pt unable to partiicipate in visual assessment    Perception     Praxis      Pertinent Vitals/Pain Pain Assessment: Faces Faces Pain Scale: No hurt  Hand Dominance Right   Extremity/Trunk Assessment Upper Extremity Assessment Upper Extremity Assessment: Generalized weakness   Lower Extremity Assessment Lower Extremity Assessment: Defer to PT evaluation       Communication Communication Communication: No difficulties   Cognition Arousal/Alertness: Awake/alert Behavior During Therapy: WFL for tasks  assessed/performed Overall Cognitive Status: No family/caregiver present to determine baseline cognitive functioning Area of Impairment: Orientation;Attention;Memory;Following commands;Problem solving Orientation Level: Disoriented to;Place;Time;Situation Current Attention Level: Focused;Sustained Memory: Decreased short-term memory Following Commands: Follows one step commands inconsistently     Problem Solving: Slow processing;Decreased initiation;Difficulty sequencing;Requires verbal cues;Requires tactile cues     General Comments       Exercises       Shoulder Instructions      Home Living Family/patient expects to be discharged to:: Unsure                                 Additional Comments: Per chart, pt was living at home with spouse who was having increasing difficulty caring for her      Prior Functioning/Environment Level of Independence: Needs assistance  Gait / Transfers Assistance Needed: Per chart review, pt was non ambulatory, but was working with HHPT.  Per last PT note, with last admission 8/16, pt was able to ambulate very short distance with minA      Comments: No famliy present     OT Diagnosis: Generalized weakness;Cognitive deficits   OT Problem List: Decreased strength;Decreased activity tolerance;Impaired balance (sitting and/or standing);Decreased cognition;Decreased safety awareness;Decreased knowledge of use of DME or AE;Impaired UE functional use   OT Treatment/Interventions: Self-care/ADL training;DME and/or AE instruction;Therapeutic activities;Cognitive remediation/compensation;Patient/family education;Balance training    OT Goals(Current goals can be found in the care plan section) Acute Rehab OT Goals OT Goal Formulation: Patient unable to participate in goal setting Time For Goal Achievement: 06/06/15 Potential to Achieve Goals: Fair ADL Goals Pt Will Perform Eating: with min assist;sitting Pt Will Perform Grooming: with  supervision;sitting Pt Will Transfer to Toilet: with mod assist;stand pivot transfer;bedside commode  OT Frequency: Min 2X/week   Barriers to D/C: Decreased caregiver support  unsure spouse is able to provide necessary level of assist        Co-evaluation              End of Session Nurse Communication: Mobility status  Activity Tolerance: Patient tolerated treatment well Patient left: in bed;with call bell/phone within reach;with bed alarm set   Time: [id number] OT Time Calculation (min): 31 min Charges:  OT General Charges $OT Visit: 1 Procedure OT Evaluation $Initial OT Evaluation Tier I: 1 Procedure OT Treatments $Self Care/Home Management : 8-22 mins G-Codes: OT G-codes **NOT FOR INPATIENT CLASS** Functional Limitation: Self care Self Care Current Status ([id number]): At least 80 percent but less than 100 percent impaired, limited or restricted Self Care Goal Status ([id number]): At least 60 percent but less than 80 percent impaired, limited or restricted  Aristides Luckey M 05/23/2015, 3:38 PM

## 2015-05-23 NOTE — Progress Notes (Signed)
[facility] PROGRESS NOTE  Minda MeoShirley M Nipp OZH:[id number] DOB: 04/29/1946 DOA: 05/22/2015  PCP: Romero BellingELLISON, SEAN, MD  Brief HPI: 69 year old African-American female with a past medical history of advanced dementia, insulin-dependent diabetes, history of seizure disorder, presented after an episode of seizure at home. She was noted to have low blood glucose level in the 40s. Apparently she had not been eating and drinking consistently at home.  Past medical history:  Past Medical History  Diagnosis Date  . ALLERGIC RHINITIS 02/08/2007  . ANXIETY 02/08/2007  . DEPRESSION 02/08/2007  . DIABETES MELLITUS, TYPE I 02/08/2007  . Headache(784.0) 11/06/2008  . HYPERCHOLESTEROLEMIA 11/05/2008  . HYPERTENSION 02/08/2007  . URINARY INCONTINENCE 02/08/2007  . Dyslipidemia   . Non-ulcer dyspepsia   . Leukopenia   . DM retinopathy (HCC)     background  . 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 Dementia   . CHF (congestive heart failure) (HCC)   . Anemia     takes iron  . Skin abnormality     both knees with opne ares and left shoulder with open areas, changes dressings prn    Consultants: None  Procedures: None  Antibiotics: None  Subjective: Patient with advanced dementia. Does not answer questions.  Objective: Vital Signs  Filed Vitals:   05/23/15 0059 05/23/15 0431 05/23/15 0847 05/23/15 1414  BP: 164/68 109/59 128/57 113/46  Pulse: 81 67 97 65  Temp: 97.4 F (36.3 C) 98.2 F (36.8 C)  98.6 F (37 C)  TempSrc: Oral Oral  Oral  Resp: 15 18 16 16   SpO2: 97% 100% 96% 97%    Intake/Output Summary (Last 24 hours) at 05/23/15 1423 Last data filed at 05/23/15 1029  Gross per 24 hour  Intake    135 ml  Output    350 ml  Net   -215 ml   There were no vitals filed for this visit.  General appearance: alert, appears stated age, distracted and no distress Resp: Diminished air entry at the bases. No crackles or wheezing. Cardio: regular rate and rhythm, S1, S2  normal, no murmur, click, rub or gallop GI: soft, non-tender; bowel sounds normal; no masses,  no organomegaly Extremities: extremities normal, atraumatic, no cyanosis or edema Neurologic: Alert. Disoriented. No obvious focal deficits.  Lab Results:  Basic Metabolic Panel:  Recent Labs Lab 05/22/15 2046 05/23/15 0549  NA 141 139  K 3.5 3.8  CL 102 100*  CO2 31 29  GLUCOSE 59* 190*  BUN 12 14  CREATININE 1.03* 0.91  CALCIUM 9.4 8.8*   Liver Function Tests:  Recent Labs Lab 05/22/15 2046  AST 19  ALT 16  ALKPHOS 69  BILITOT 0.8  PROT 7.2  ALBUMIN 3.4*   CBC:  Recent Labs Lab 05/22/15 2046 05/23/15 0549  WBC 8.2 6.8  NEUTROABS 5.5  --   HGB 10.6* 9.9*  HCT 33.0* 30.1*  MCV 83.1 82.9  PLT 246 224   CBG:  Recent Labs Lab 05/23/15 0242 05/23/15 0410 05/23/15 0552 05/23/15 0756 05/23/15 1233  GLUCAP 232* 196* 199* 158* 186*    No results found for this or any previous visit (from the past 240 hour(s)).    Studies/Results: Dg Chest Portable 1 View  05/22/2015  CLINICAL DATA:  4 minutes seizures today. History of hypertension. History of CHF. EXAM: PORTABLE CHEST 1 VIEW COMPARISON:  04/07/2015 FINDINGS: Mild cardiac enlargement without vascular congestion. No focal airspace disease or consolidation in the lungs. No blunting of  costophrenic angles. No pneumothorax. Calcified and tortuous aorta. Mediastinal contours appear intact. Degenerative changes in the spine and shoulders. IMPRESSION: Mild cardiac enlargement.  No evidence of active pulmonary disease. Electronically Signed   By: Burman Nieves M.D.   On: 05/22/2015 20:38    Medications:  Scheduled: . atorvastatin  20 mg Oral Daily  . carvedilol  3.125 mg Oral BID WC  . divalproex  250 mg Oral q morning - 10a  . donepezil  10 mg Oral QHS  . furosemide  20 mg Oral Daily  . memantine  28 mg Oral QHS  . sodium chloride  3 mL Intravenous Q12H   Continuous:  [id number], sodium  chloride  Assessment/Plan:  Principal Problem:   Hypoglycemia Active Problems:   Essential hypertension   Congestive dilated cardiomyopathy (HCC)   Severe dementia   Chronic combined systolic and diastolic congestive heart failure (HCC)   Seizure disorder (HCC)    Hypoglycemia Likely secondary to poor oral intake along with use of insulin. HbA1c is pending. We will likely need to liberalize her glycemic control. May need to discontinue her insulin altogether. Continue to monitor for now. She is currently off of D5 infusion.  Seizure in the setting of known seizure disorder Current seizure was most likely secondary to hypoglycemia. Continue Depakote for now. She was given 1 g of Depakote in the emergency department. Her Depakote level was 30 at admission. Repeat in the morning.  History of advanced dementia Continue home medication. Poor oral intake is likely due to dementia. Discussed with her husband who is talking to patient's primary care physician about getting hospice services at home. This might be a reasonable option for this patient.  History of chronic combined systolic and diastolic congestive heart failure Currently stable. Euvolemic. Continue home medications  History of essential hypertension Blood pressure is stable. Continue current medications.  Normocytic anemia No bleeding. Hemoglobin is stable.  DVT Prophylaxis: SCDs    Code Status: Full code. This was discussed with husband. He will discuss with rest of family and get back to [place]. Family Communication: Discussed with the patient's husband  Disposition Plan: Anticipate discharge tomorrow      [facility]  [facility] Pager [phone] 05/23/2015, 2:23 PM  If 7PM-7AM, please contact night-coverage at [email or web address], password [facility]

## 2015-05-23 NOTE — Progress Notes (Signed)
Initial Nutrition Assessment  DOCUMENTATION CODES:   Obesity unspecified  INTERVENTION:   -Boost Breeze po daily, each supplement provides 250 kcal and 9 grams of protein -Glucerna Shake po TID, each supplement provides 220 kcal and 10 grams of protein  NUTRITION DIAGNOSIS:   Inadequate oral intake related to poor appetite as evidenced by meal completion < 25%.  GOAL:   Patient will meet greater than or equal to 90% of their needs  MONITOR:   PO intake, Supplement acceptance, Labs, Weight trends, Skin, I & O's  REASON FOR ASSESSMENT:   Consult Poor PO  ASSESSMENT:   69 yo female h/o IDDM, adv dementia bed bound, not eating consistently lives with husband. All history from husband. No fevers. No n/v/d. No swelling. Pt glucose dropped today in the 40s and she had a seizure per husband, with known h/o seizure disorder. Pt given several amps of d5o she is up sitting in bed about to eat something, mental status at baseline now. Referred for admission for hypoglycemia.  Pt admitted with breakthrough seizure and hypoglycemia.   Per chart review, pt with poor po intake PTA with hypoglycemia due to not eating consistently. Pt is pleasant, but unable to provide reliable hx due to cognitive deficit.   Meal completion 20%. Pt completed about 50% of her Boost Breeze supplement, which she reported she liked. She reveals that she does not take supplements at home, but is willing to try them. Will order Glucerna and Boost breeze for multiple offerings.   Nutrition-Focused physical exam completed. Findings are no fat depletion, mild muscle depletion, and mild edema. Suspect muscle depletion due to advanced age and bedbound status.   Labs reviewed: CBGS: 158-199. Last Agb A1c reveals fair control (7.4 on 02/26/15).   Diet Order:  Diet heart healthy/carb modified Room service appropriate?: Yes; Fluid consistency:: Thin  Skin:  Reviewed, no issues  Last BM:  05/22/15  Height:   Ht  Readings from Last 1 Encounters:  04/08/15 5\' 3"  (1.6 m)    Weight:   Wt Readings from Last 1 Encounters:  04/11/15 178 lb 2.1 oz (80.8 kg)    Ideal Body Weight:  52.2 kg  BMI:  Estimated body mass index is 31.56 kg/(m^2) as calculated from the following:   Height as of 04/08/15: 5\' 3"  (1.6 m).   Weight as of 04/07/15: 178 lb 2.1 oz (80.8 kg).  Estimated Nutritional Needs:   Kcal:  1600-1800  Protein:  80-90 grams  Fluid:  1.6-1.8 L  EDUCATION NEEDS:   Education needs no appropriate at this time  Kensley Valladares A. Mayford KnifeWilliams, RD, LDN, CDE Pager: [phone] After hours Pager: [phone]

## 2015-05-23 NOTE — Care Management Note (Addendum)
Case Management Note  Patient Details  Name: Dana [name] MRN: [id number] Date of Birth: 06/03/1946  Subjective/Objective:      Date:05/23/15 Spoke with Mr. [name] [phone] cell, home phone [phone]. Introduced self as Sports coachcase manager and explained role in discharge planning and how to be reached. Verified patient lives in town, with spouse. Has DME hospital bed, rolling walker, cane and a w/chair at home. Expressed potential need for no other DME. Verified  spouse is not sure about disposition at this time, pt eval recs snf, he states [facility] but they did not have enough help there.  He states he [facility] about a week in half ago and they said she did not meet the criteria for hospice.  NCM informed him that a CSW will be speaking with him regarding snf.  Spouse denied needing help with their medication. Patient is driven by spouse to MD appointments. Verified patient has PCP Romero BellingSean Ellison.   Plan: CM will continue to follow for discharge planning and [facility] resources. Action/Plan:   Expected Discharge Date:                  Expected Discharge Plan:  Skilled Nursing Facility  In-House Referral:  Clinical Social Work  Discharge planning Services  CM Consult  Post Acute Care Choice:    Choice offered to:     DME Arranged:    DME Agency:     HH Arranged:    HH Agency:     Status of Service:  In process, will continue to follow  Medicare Important Message Given:    Date Medicare IM Given:    Medicare IM give by:    Date Additional Medicare IM Given:    Additional Medicare Important Message give by:     If discussed at Long Length of Stay Meetings, dates discussed:    Additional Comments:  [name], [name], RN 05/23/2015, 3:08 PM

## 2015-05-23 NOTE — ED Notes (Signed)
David, MD at bedside.  

## 2015-05-23 NOTE — H&P (Signed)
PCP:   Romero Belling, MD   Chief Complaint:  seizure  HPI: 69 yo female h/o IDDM, adv dementia bed bound, not eating consistently lives with husband.  All history from husband.  No fevers.  No n/v/d.  No swelling.  Pt glucose dropped today in the 40s and she had a seizure per husband, with known h/o seizure disorder.  Pt given several amps of d5o she is up sitting in bed about to eat something, mental status at baseline now.  Referred for admission for hypoglycemia.  Review of Systems:  Positive and negative as per HPI otherwise all other systems are negative per husband  Past Medical History: Past Medical History  Diagnosis Date  . ALLERGIC RHINITIS 02/08/2007  . ANXIETY 02/08/2007  . DEPRESSION 02/08/2007  . DIABETES MELLITUS, TYPE I 02/08/2007  . Headache(784.0) 11/06/2008  . HYPERCHOLESTEROLEMIA 11/05/2008  . HYPERTENSION 02/08/2007  . URINARY INCONTINENCE 02/08/2007  . Dyslipidemia   . Non-ulcer dyspepsia   . Leukopenia   . DM retinopathy (HCC)     background  . 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 Dementia   . CHF (congestive heart failure) (HCC)   . Anemia     takes iron  . Skin abnormality     both knees with opne ares and left shoulder with open areas, changes dressings prn   Past Surgical History  Procedure Laterality Date  . Total knee arthroplasty  1990    left  . Panendoscopy  10/27/1999  . Stress cardiolite  12/14/2003  . Electrocardiogram  05/17/2006  . Abdominal hysterectomy    . Bladder tach  years ago  . Colonoscopy with propofol N/A 06/21/2014    Procedure: COLONOSCOPY WITH PROPOFOL;  Surgeon: Rachael Fee, MD;  Location: [facility] ENDOSCOPY;  Service: Endoscopy;  Laterality: N/A;    Medications: Prior to Admission medications   Medication Sig Start Date End Date Taking? Authorizing Provider  aspirin EC 325 MG tablet TAKE 1 TABLET (325 MG TOTAL) BY MOUTH DAILY. 03/04/15  Yes Kathleene Hazel, MD  atorvastatin (LIPITOR) 20 MG tablet TAKE 1  TABLET BY MOUTH EVERY DAY 04/01/15  Yes Romero Belling, MD  carvedilol (COREG) 3.125 MG tablet Take 1 tablet (3.125 mg total) by mouth 2 (two) times daily with a meal. Patient taking differently: Take 3.125 mg by mouth daily.  07/17/14  Yes Kathleene Hazel, MD  citalopram (CELEXA) 20 MG tablet TAKE 1 TABLET (20 MG TOTAL) BY MOUTH DAILY. 10/29/14  Yes Romero Belling, MD  divalproex (DEPAKOTE ER) 250 MG 24 hr tablet Take 250 mg by mouth every morning.  07/09/14  Yes Historical Provider, MD  donepezil (ARICEPT) 10 MG tablet Take 10 mg by mouth at bedtime.   Yes Historical Provider, MD  furosemide (LASIX) 40 MG tablet Take 40 mg by mouth daily.    Yes Historical Provider, MD  HUMALOG MIX 50/50 KWIKPEN (50-50) 100 UNIT/ML Kwikpen INJECT 25 UNITS INTO THE SKIN DAILY WITH BREAKFAST 04/29/15  Yes Romero Belling, MD  hyoscyamine (LEVSIN SL) 0.125 MG SL tablet PLACE 1 TABLET UNDER THE TONGUE TWICE DAILY AS NEEDED FOR CRAMPS AND SPASMS. 04/01/15  Yes Lori P Hvozdovic, PA-C  lisinopril (PRINIVIL,ZESTRIL) 2.5 MG tablet TAKE 1 TABLET (2.5 MG TOTAL) BY MOUTH EVERY MORNING. 03/11/15  Yes Kathleene Hazel, MD  loperamide (IMODIUM) 2 MG capsule Take 1 capsule (2 mg total) by mouth as needed for diarrhea or loose stools. 04/11/15  Yes Hollice Espy, MD  memantine (NAMENDA XR) 28 MG CP24 24 hr capsule Take 28 mg by mouth at bedtime.    Yes Historical Provider, MD  Propylene Glycol (SYSTANE BALANCE OP) Apply 1 drop to eye 3 (three) times daily as needed (dry eyes).   Yes Historical Provider, MD  sodium chloride (OCEAN) 0.65 % SOLN nasal spray Place 2 sprays into both nostrils as needed for congestion.   Yes Historical Provider, MD  feeding supplement (BOOST / RESOURCE BREEZE) LIQD Take 1 Container by mouth 2 (two) times daily between meals. Patient not taking: Reported on 04/29/2015 04/11/15   Hollice Espy, MD  Insulin Lispro Prot & Lispro (HUMALOG MIX 50/50 KWIKPEN) (50-50) 100 UNIT/ML Kwikpen Inject 25 Units  into the skin daily with breakfast. Patient not taking: Reported on 05/22/2015 03/20/15   Romero Belling, MD    Allergies:   Allergies  Allergen Reactions  . Aspirin Nausea Only  . Codeine Nausea Only    Social History:  reports that she has never smoked. She has never used smokeless tobacco. She reports that she does not drink alcohol or use illicit drugs.  Family History: Family History  Problem Relation Age of Onset  . Alcohol abuse Son   . Cancer Neg Hx     no cancer in immediate family  . CAD Father   . Colon cancer Neg Hx   . Colon polyps Neg Hx   . Diabetes Father   . Kidney disease Neg Hx   . Esophageal cancer Neg Hx   . Gallbladder disease Neg Hx     Physical Exam: Filed Vitals:   05/22/15 2215 05/22/15 2300 05/22/15 2315 05/22/15 2345  BP: 182/96 158/82 159/82 198/84  Pulse:  73 76 69  Temp:      TempSrc:      Resp:  SpO2:  98% 98% 98%   General appearance: alert, cooperative and no distress Head: Normocephalic, without obvious abnormality, atraumatic Eyes: negative Nose: Nares normal. Septum midline. Mucosa normal. No drainage or sinus tenderness. Neck: no JVD and supple, symmetrical, trachea midline Lungs: clear to auscultation bilaterally Heart: regular rate and rhythm, S1, S2 normal, no murmur, click, rub or gallop Abdomen: soft, non-tender; bowel sounds normal; no masses,  no organomegaly Extremities: extremities normal, atraumatic, no cyanosis or edema Pulses: 2+ and symmetric Skin: Skin color, texture, turgor normal. No rashes or lesions Neurologic: Grossly normal    Labs on Admission:   Recent Labs  05/22/15 2046  NA 141  K 3.5  CL 102  CO2 31  GLUCOSE 59*  BUN 12  CREATININE 1.03*  CALCIUM 9.4    Recent Labs  05/22/15 2046  AST 19  ALT 16  ALKPHOS 69  BILITOT 0.8  PROT 7.2  ALBUMIN 3.4*    Recent Labs  05/22/15 2046  WBC 8.2  NEUTROABS 5.5  HGB 10.6*  HCT 33.0*  MCV 83.1  PLT 246    Radiological Exams  on Admission: Dg Chest Portable 1 View  05/22/2015  CLINICAL DATA:  4 minutes seizures today. History of hypertension. History of CHF. EXAM: PORTABLE CHEST 1 VIEW COMPARISON:  04/07/2015 FINDINGS: Mild cardiac enlargement without vascular congestion. No focal airspace disease or consolidation in the lungs. No blunting of costophrenic angles. No pneumothorax. Calcified and tortuous aorta. Mediastinal contours appear intact. Degenerative changes in the spine and shoulders. IMPRESSION: Mild cardiac enlargement.  No evidence of active pulmonary disease. Electronically Signed   By: Burman Nieves M.D.   On: 05/22/2015  20:7138    Assessment/Plan  69 yo female with breakthrough seizure likely due to hypoglycemia from poor po intake due to natural progression of her dementia  Principal Problem:   Hypoglycemia-  Resolving.  Check q 2 hour glucose for next couple of times, if normalizes can check q 4 hours.  Cont on d5 ivf overnight.  Hold all insulin products.  Active Problems:   Essential hypertension- stable   Congestive dilated cardiomyopathy (HCC)- noted   Severe dementia   Chronic combined systolic and diastolic congestive heart failure (HCC)   Seizure disorder (HCC)-  Noted, given additional dilantin in ED.    Clarify home meds.  Hold insulin.  Check glucose freqently.  obs on tele.    Fitzgerald Dunne A 05/23/2015, 12:21 AM

## 2015-05-24 ENCOUNTER — Ambulatory Visit: Payer: Self-pay

## 2015-05-24 DIAGNOSIS — I5042 Chronic combined systolic (congestive) and diastolic (congestive) heart failure: Secondary | ICD-10-CM | POA: Diagnosis not present

## 2015-05-24 DIAGNOSIS — G40909 Epilepsy, unspecified, not intractable, without status epilepticus: Secondary | ICD-10-CM

## 2015-05-24 DIAGNOSIS — D649 Anemia, unspecified: Secondary | ICD-10-CM | POA: Diagnosis not present

## 2015-05-24 DIAGNOSIS — I110 Hypertensive heart disease with heart failure: Secondary | ICD-10-CM | POA: Diagnosis not present

## 2015-05-24 DIAGNOSIS — F0390 Unspecified dementia without behavioral disturbance: Secondary | ICD-10-CM | POA: Diagnosis not present

## 2015-05-24 DIAGNOSIS — E11649 Type 2 diabetes mellitus with hypoglycemia without coma: Secondary | ICD-10-CM | POA: Diagnosis not present

## 2015-05-24 DIAGNOSIS — E11319 Type 2 diabetes mellitus with unspecified diabetic retinopathy without macular edema: Secondary | ICD-10-CM | POA: Diagnosis not present

## 2015-05-24 DIAGNOSIS — I420 Dilated cardiomyopathy: Secondary | ICD-10-CM | POA: Diagnosis not present

## 2015-05-24 DIAGNOSIS — F329 Major depressive disorder, single episode, unspecified: Secondary | ICD-10-CM | POA: Diagnosis not present

## 2015-05-24 LAB — GLUCOSE, CAPILLARY
GLUCOSE-CAPILLARY: 275 mg/dL — AB (ref 65–99)
Glucose-Capillary: 169 mg/dL — ABNORMAL HIGH (ref 65–99)
Glucose-Capillary: 177 mg/dL — ABNORMAL HIGH (ref 65–99)
Glucose-Capillary: 239 mg/dL — ABNORMAL HIGH (ref 65–99)
Glucose-Capillary: 300 mg/dL — ABNORMAL HIGH (ref 65–99)

## 2015-05-24 LAB — BASIC METABOLIC PANEL
Anion gap: 14 (ref 5–15)
BUN: 14 mg/dL (ref 6–20)
CALCIUM: 9 mg/dL (ref 8.9–10.3)
CO2: 26 mmol/L (ref 22–32)
CREATININE: 0.73 mg/dL (ref 0.44–1.00)
Chloride: 98 mmol/L — ABNORMAL LOW (ref 101–111)
GFR calc non Af Amer: >60 mL/min (ref >60)
GLUCOSE: 184 mg/dL — AB (ref 65–99)
Potassium: 3.6 mmol/L (ref 3.5–5.1)
Sodium: 138 mmol/L (ref 135–145)

## 2015-05-24 LAB — HEMOGLOBIN A1C
Hgb A1c MFr Bld: 7.9 % — ABNORMAL HIGH (ref 4.8–5.6)
MEAN PLASMA GLUCOSE: 180 mg/dL

## 2015-05-24 LAB — VALPROIC ACID LEVEL: Valproic Acid Lvl: 43 ug/mL — ABNORMAL LOW (ref 50.0–100.0)

## 2015-05-24 MED ORDER — DIVALPROEX SODIUM ER 250 MG PO TB24
250.0000 mg | ORAL_TABLET | Freq: Two times a day (BID) | ORAL | Status: DC
  Filled 2015-05-24 (×2): qty 1

## 2015-05-24 MED ORDER — DIVALPROEX SODIUM ER 250 MG PO TB24: 500.0000 mg | ORAL_TABLET | Freq: Every morning | ORAL | Status: DC

## 2015-05-24 MED ORDER — INSULIN LISPRO PROT & LISPRO (50-50 MIX) 100 UNIT/ML KWIKPEN: 5.0000 [IU] | PEN_INJECTOR | Freq: Every day | SUBCUTANEOUS | Status: DC

## 2015-05-24 MED ORDER — DIVALPROEX SODIUM ER 500 MG PO TB24
500.0000 mg | ORAL_TABLET | Freq: Every day | ORAL | Status: DC
  Administered 2015-05-24: 500 mg via ORAL
  Filled 2015-05-24: qty 1

## 2015-05-24 NOTE — Discharge Instructions (Signed)
Hypoglycemia °Hypoglycemia occurs when the glucose in your blood is too low. Glucose is a type of sugar that is your body's main energy source. Hormones, such as insulin and glucagon, control the level of glucose in the blood. Insulin lowers blood glucose and glucagon increases blood glucose. Having too much insulin in your blood stream, or not eating enough food containing sugar, can result in hypoglycemia. Hypoglycemia can happen to people with or without diabetes. It can develop quickly and can be a medical emergency.  °CAUSES  °· Missing or delaying meals. °· Not eating enough carbohydrates at meals. °· Taking too much diabetes medicine. °· Not timing your oral diabetes medicine or insulin doses with meals, snacks, and exercise. °· Nausea and vomiting. °· Certain medicines. °· Severe illnesses, such as hepatitis, kidney disorders, and certain eating disorders. °· Increased activity or exercise without eating something extra or adjusting medicines. °· Drinking too much alcohol. °· A nerve disorder that affects body functions like your heart rate, blood pressure, and digestion (autonomic neuropathy). °· A condition where the stomach muscles do not function properly (gastroparesis). Therefore, medicines and food may not absorb properly. °· Rarely, a tumor of the pancreas can produce too much insulin. °SYMPTOMS  °· Hunger. °· Sweating (diaphoresis). °· Change in body temperature. °· Shakiness. °· Headache. °· Anxiety. °· Lightheadedness. °· Irritability. °· Difficulty concentrating. °· Dry mouth. °· Tingling or numbness in the hands or feet. °· Restless sleep or sleep disturbances. °· Altered speech and coordination. °· Change in mental status. °· Seizures or prolonged convulsions. °· Combativeness. °· Drowsiness (lethargic). °· Weakness. °· Increased heart rate or palpitations. °· Confusion. °· Pale, gray skin color. °· Blurred or double vision. °· Fainting. °DIAGNOSIS  °A physical exam and medical history will be  performed. Your caregiver may make a diagnosis based on your symptoms. Blood tests and other lab tests may be performed to confirm a diagnosis. Once the diagnosis is made, your caregiver will see if your signs and symptoms go away once your blood glucose is raised.  °TREATMENT  °Usually, you can easily treat your hypoglycemia when you notice symptoms. °· Check your blood glucose. If it is less than 70 mg/dl, take one of the following:   °¨ 3-4 glucose tablets.   °¨ ½ cup juice.   °¨ ½ cup regular soda.   °¨ 1 cup skim milk.   °¨ ½-1 tube of glucose gel.   °¨ 5-6 hard candies.   °· Avoid high-fat drinks or food that may delay a rise in blood glucose levels. °· Do not take more than the recommended amount of sugary foods, drinks, gel, or tablets. Doing so will cause your blood glucose to go too high.   °· Wait 10-15 minutes and recheck your blood glucose. If it is still less than 70 mg/dl or below your target range, repeat treatment.   °· Eat a snack if it is more than 1 hour until your next meal.   °There may be a time when your blood glucose may go so low that you are unable to treat yourself at home when you start to notice symptoms. You may need someone to help you. You may even faint or be unable to swallow. If you cannot treat yourself, someone will need to bring you to the hospital.  °HOME CARE INSTRUCTIONS °· If you have diabetes, follow your diabetes management plan by: °· Taking your medicines as directed. °· Following your exercise plan. °· Following your meal plan. Do not skip meals. Eat on time. °· Testing your blood   glucose regularly. Check your blood glucose before and after exercise. If you exercise longer or different than usual, be sure to check blood glucose more frequently. °· Wearing your medical alert jewelry that says you have diabetes. °· Identify the cause of your hypoglycemia. Then, develop ways to prevent the recurrence of hypoglycemia. °· Do not take a hot bath or shower right after an  insulin shot. °· Always carry treatment with you. Glucose tablets are the easiest to carry. °· If you are going to drink alcohol, drink it only with meals. °· Tell friends or family members ways to keep you safe during a seizure. This may include removing hard or sharp objects from the area or turning you on your side. °· Maintain a healthy weight. °SEEK MEDICAL CARE IF:  °· You are having problems keeping your blood glucose in your target range. °· You are having frequent episodes of hypoglycemia. °· You feel you might be having side effects from your medicines. °· You are not sure why your blood glucose is dropping so low. °· You notice a change in vision or a new problem with your vision. °SEEK IMMEDIATE MEDICAL CARE IF:  °· Confusion develops. °· A change in mental status occurs. °· The inability to swallow develops. °· Fainting occurs. °  °This information is not intended to replace advice given to you by your health care provider. Make sure you discuss any questions you have with your health care provider. °  °Document Released: 07/06/2005 Document Revised: 07/11/2013 Document Reviewed: 03/12/2015 °Elsevier Interactive Patient Education ©2016 [employer] ° °Epilepsy °Epilepsy is a disorder in which a person has repeated seizures over time. A seizure is a release of abnormal electrical activity in the brain. Seizures can cause a change in attention, behavior, or the ability to remain awake and alert (altered mental status). Seizures often involve uncontrollable shaking (convulsions).  °Most people with epilepsy lead normal lives. However, people with epilepsy are at an increased risk of falls, accidents, and injuries. Therefore, it is important to begin treatment right away. °CAUSES  °Epilepsy has many possible causes. Anything that disturbs the normal pattern of brain cell activity can lead to seizures. This may include:  °· Head injury. °· Birth trauma. °· High fever as a child. °· Stroke. °· Bleeding into  or around the brain. °· Certain drugs. °· Prolonged low oxygen, such as what occurs after CPR efforts. °· Abnormal brain development. °· Certain illnesses, such as meningitis, encephalitis (brain infection), malaria, and other infections. °· An imbalance of nerve signaling chemicals (neurotransmitters).   °SIGNS AND SYMPTOMS  °The symptoms of a seizure can vary greatly from one person to another. Right before a seizure, you may have a warning (aura) that a seizure is about to occur. An aura may include the following symptoms: °· Fear or anxiety. °· Nausea. °· Feeling like the room is spinning (vertigo). °· Vision changes, such as seeing flashing lights or spots. °Common symptoms during a seizure include: °· Abnormal sensations, such as an abnormal smell or a bitter taste in the mouth.   °· Sudden, general body stiffness.   °· Convulsions that involve rhythmic jerking of the face, arm, or leg on one or both sides.   °· Sudden change in consciousness.   °¨ Appearing to be awake but not responding.   °¨ Appearing to be asleep but cannot be awakened.   °· Grimacing, chewing, lip smacking, drooling, tongue biting, or loss of bowel or bladder control. °After a seizure, you may feel sleepy for a while.  °  DIAGNOSIS  °Your health care provider will ask about your symptoms and take a medical history. Descriptions from any witnesses to your seizures will be very helpful in the diagnosis. A physical exam, including a detailed neurological exam, is necessary. Various tests may be done, such as:  °· An electroencephalogram (EEG). This is a painless test of your brain waves. In this test, a diagram is created of your brain waves. These diagrams can be interpreted by a specialist. °· An MRI of the brain.   °· A CT scan of the brain.   °· A spinal tap (lumbar puncture, LP). °· Blood tests to check for signs of infection or abnormal blood chemistry. °TREATMENT  °There is no cure for epilepsy, but it is generally treatable. Once  epilepsy is diagnosed, it is important to begin treatment as soon as possible. For most people with epilepsy, seizures can be controlled with medicines. The following may also be used: °· A pacemaker for the brain (vagus nerve stimulator) can be used for people with seizures that are not well controlled by medicine. °· Surgery on the brain. °For some people, epilepsy eventually goes away. °HOME CARE INSTRUCTIONS  °· Follow your health care provider's recommendations on driving and safety in normal activities. °· Get enough rest. Lack of sleep can cause seizures. °· Only take over-the-counter or prescription medicines as directed by your health care provider. Take any prescribed medicine exactly as directed. °· Avoid any known triggers of your seizures. °· Keep a seizure diary. Record what you recall about any seizure, especially any possible trigger.   °· Make sure the people you live and work with know that you are prone to seizures. They should receive instructions on how to help you. In general, a witness to a seizure should:   °¨ Cushion your head and body.   °¨ Turn you on your side.   °¨ Avoid unnecessarily restraining you.   °¨ Not place anything inside your mouth.   °¨ Call for emergency medical help if there is any question about what has occurred.   °· Follow up with your health care provider as directed. You may need regular blood tests to monitor the levels of your medicine.   °SEEK MEDICAL CARE IF:  °· You develop signs of infection or other illness. This might increase the risk of a seizure.   °· You seem to be having more frequent seizures.   °· Your seizure pattern is changing.   °SEEK IMMEDIATE MEDICAL CARE IF:  °· You have a seizure that does not stop after a few moments.   °· You have a seizure that causes any difficulty in breathing.   °· You have a seizure that results in a very severe headache.   °· You have a seizure that leaves you with the inability to speak or use a part of your body.   °    °This information is not intended to replace advice given to you by your health care provider. Make sure you discuss any questions you have with your health care provider. °  °Document Released: 07/06/2005 Document Revised: 04/26/2013 Document Reviewed: 02/15/2013 °Elsevier Interactive Patient Education ©2016 [employer] ° °

## 2015-05-24 NOTE — Care Management Note (Signed)
Case Management Note  Patient Details  Name: Dana [name] MRN: [id number] Date of Birth: 10/23/1945  Subjective/Objective:     NCM spoke with MR. [name] he states he want to take patient home with hh services , [facility], [facility], [place], awaiting call back to make sure they can take her.  Mr. [name] states he does not want to pay for ambulance so he and his son will take patient home today around 5 pm when his son gets off work so that he can help to get patient into the house.  NCM explained to him that an ambulance transport would be safer, but he states he can not afford to pay for ambulance.  NCM awaiting call back from [facility].  Also explained to him that if they change their mind about patient going to snf while at home that the social worker with hh services should be able to facilitate that.                 Action/Plan:   Expected Discharge Date:                  Expected Discharge Plan:  Skilled Nursing Facility  In-House Referral:  Clinical Social Work  Discharge planning Services  CM Consult  Post Acute Care Choice:  Home Health Choice offered to:  Spouse  DME Arranged:    DME Agency:     HH Arranged:  RN, PT, OT, Nurse's Aide, Social Work [employer] [facility]  Status of Service:  Completed, signed off  Medicare Important Message Given:    Date Medicare IM Given:    Medicare IM give by:    Date Additional Medicare IM Given:    Additional Medicare Important Message give by:     If discussed at Long Length of Stay Meetings, dates discussed:    Additional Comments:  [name], Terrye Dombrosky [place], RN 05/24/2015, 11:54 AM

## 2015-05-24 NOTE — Evaluation (Signed)
Physical Therapy Evaluation Patient Details Name: Dana [name] MRN: [id number] DOB: [birth year] Today's Date: 05/24/2015   History of Present Illness  70 yo female h/o IDDM, adv dementia bed bound, not eating consistently lives with husband.Pt glucose dropped today in the 40s and she had a seizure per husband, with known h/o seizure disorder.  Clinical Impression  Pt admitted with above diagnosis and presents to PT with functional limitations due to deficits listed below (See PT problem list). Pt needs skilled PT to maximize independence and safety to allow discharge to SNF.      Follow Up Recommendations SNF    Equipment Recommendations  None recommended by PT    Recommendations for Other Services       Precautions / Restrictions Precautions Precautions: Fall Precaution Comments: h/o advanced dementia      Mobility  Bed Mobility Overal bed mobility: Needs Assistance Bed Mobility: Supine to Sit;Sit to Supine     Supine to sit: +2 for physical assistance;Mod assist Sit to supine: +2 for physical assistance;Mod assist   General bed mobility comments: Assist with legs and trunk.  Transfers Overall transfer level: Needs assistance Equipment used: 1 person hand held assist;2 person hand held assist Transfers: Sit to/from Stand Sit to Stand: Mod assist;+2 physical assistance         General transfer comment: Performed sit to stand from bed x 4. Mod A with 2 people initially and then able to perform with 1 person. Stood for 30-60 secs with 2 min A to maintain standing. Pt would sit very quickly when she felt fatigued.  Ambulation/Gait                Stairs            Wheelchair Mobility    Modified Rankin (Stroke Patients Only)       Balance Overall balance assessment: Needs assistance Sitting-balance support: No upper extremity supported;Feet supported Sitting balance-Leahy Scale: Fair     Standing balance support: Bilateral upper extremity  supported Standing balance-Leahy Scale: Poor Standing balance comment: Stood for 30-60 secs with 2 min A to maintain standing. Pt would sit very quickly when she felt fatigued.                             Pertinent Vitals/Pain Faces Pain Scale: No hurt    Home Living Family/patient expects to be discharged to:: Unsure                 Additional Comments: Per chart, pt was living at home with spouse who was having increasing difficulty caring for her    Prior Function Level of Independence: Needs assistance   Gait / Transfers Assistance Needed: Per chart review, pt was non ambulatory, but was working with HHPT.  Per last PT note, with last admission 8/16, pt was able to ambulate very short distance with minA      Comments: No famliy present      Hand Dominance   Dominant Hand: Right    Extremity/Trunk Assessment   Upper Extremity Assessment: Defer to OT evaluation           Lower Extremity Assessment: Generalized weakness         Communication   Communication: No difficulties  Cognition Arousal/Alertness: Awake/alert Behavior During Therapy: WFL for tasks assessed/performed Overall Cognitive Status: No family/caregiver present to determine baseline cognitive functioning Area of Impairment: Orientation;Attention;Memory;Following commands;Problem solving Orientation Level: Disoriented to;Place;Time;Situation Current  Attention Level: Focused;Sustained Memory: Decreased short-term memory Following Commands: Follows one step commands inconsistently     Problem Solving: Slow processing;Decreased initiation;Difficulty sequencing;Requires verbal cues;Requires tactile cues      General Comments      Exercises        Assessment/Plan    PT Assessment Patient needs continued PT services  PT Diagnosis Difficulty walking;Generalized weakness   PT Problem List Decreased strength;Decreased activity tolerance;Decreased balance;Decreased  mobility;Decreased cognition;Decreased knowledge of use of DME  PT Treatment Interventions DME instruction;Gait training;Functional mobility training;Therapeutic activities;Therapeutic exercise;Balance training;Patient/family education   PT Goals (Current goals can be found in the Care Plan section) Acute Rehab PT Goals Patient Stated Goal: Pt didn't state PT Goal Formulation: Patient unable to participate in goal setting Time For Goal Achievement: 06/07/15 Potential to Achieve Goals: Good    Frequency Min 2X/week   Barriers to discharge        Co-evaluation               End of Session Equipment Utilized During Treatment: Gait belt Activity Tolerance: Patient tolerated treatment well Patient left: in bed;with call bell/phone within reach;with bed alarm set      Functional Assessment Tool Used: clinical judgement Functional Limitation: Mobility: Walking and moving around Mobility: Walking and Moving Around Current Status ([id number]([id number]): At least 80 percent but less than 100 percent impaired, limited or restricted Mobility: Walking and Moving Around Goal Status [phone]([id number]): At least 40 percent but less than 60 percent impaired, limited or restricted    Time: 1040-1053 PT Time Calculation (min) (ACUTE ONLY): 13 min   Charges:   PT Evaluation $Initial PT Evaluation Tier I: 1 Procedure     PT G Codes:   PT G-Codes **NOT FOR INPATIENT CLASS** Functional Assessment Tool Used: clinical judgement Functional Limitation: Mobility: Walking and moving around Mobility: Walking and Moving Around Current Status ([id number]([id number]): At least 80 percent but less than 100 percent impaired, limited or restricted Mobility: Walking and Moving Around Goal Status [phone]([id number]): At least 40 percent but less than 60 percent impaired, limited or restricted    [facility] CenterMAYCOCK,Dana [name] 05/24/2015, 12:01 PM [facility] Jacynda [name] PT [phone]

## 2015-05-24 NOTE — Discharge Summary (Signed)
[facility]  Physician Discharge Summary   Patient ID: Dana [name] MRN: [id number] DOB/AGE: 1946/01/20 69 y.o.  Admit date: 05/22/2015 Discharge date: 05/24/2015  PCP: Romero Belling, MD  DISCHARGE DIAGNOSES:  Principal Problem:   Hypoglycemia Active Problems:   Essential hypertension   Congestive dilated cardiomyopathy (HCC)   Severe dementia   Chronic combined systolic and diastolic congestive heart failure (HCC)   Seizure disorder (HCC)   RECOMMENDATIONS FOR OUTPATIENT FOLLOW UP: 1. Patient is now DO NOT RESUSCITATE 2. Home health has been arranged. 3. Family declined SNF placement 4. Insulin dose has been adjusted as discussed below  DISCHARGE CONDITION: fair  Diet recommendation: Modified carbohydrate  INITIAL HISTORY: 69 year old African-American female with a past medical history of advanced dementia, insulin-dependent diabetes, history of seizure disorder, presented after an episode of seizure at home. She was noted to have low blood glucose level in the 40s. Apparently she had not been eating and drinking consistently at home.   HOSPITAL COURSE:   Hypoglycemia Likely secondary to poor oral intake along with use of insulin. HbA1c is 7.9. Blood sugars have stabilized over the last 24 hours off of D5 infusion and some values are greater than 200. Discussed with patient's husband. Dr. George Hugh notes also reviewed. Patient was taking 25 units of insulin 50/50 at home. At this time, we would recommend to reduce the dose significantly to 5 units daily. And then have close outpatient follow-up with her outpatient provider. Instructed her husband to check her blood glucose levels 3 times a day before meals and to maintain a record for Dr. Everardo All.   Seizure in the setting of known seizure disorder Current seizure was most likely secondary to hypoglycemia. Depakote level was noted to be subtherapeutic. She was given 1 g of Depakote intravenously in the emergency  department. We will increase the dose of her Depakote to . Hasn't had any further seizure activity in the hospital.   History of advanced dementia Continue home medication. Poor oral intake is likely due to dementia. Discussed with her husband who is talking to patient's primary care physician about getting hospice services at home. Apparently hospice has reviewed the patient's case and she is not a candidate quite yet.   History of chronic combined systolic and diastolic congestive heart failure Currently Euvolemic. Based on echocardiogram from August 2016 her EF is around 30%. Continue home medications.  History of essential hypertension Blood pressure is stable. Continue current medications.  Normocytic anemia No bleeding. Hemoglobin is stable.  Overall, stable. No seizure activity in more than 36 hours. Okay for discharge home today. Home health has been ordered.   PERTINENT LABS:  The results of significant diagnostics from this hospitalization (including imaging, microbiology, ancillary and laboratory) are listed below for reference.      Labs: Basic Metabolic Panel:  Recent Labs Lab 05/22/15 2046 05/23/15 0549 05/24/15 0541  NA 141 139 138  K 3.5 3.8 3.6  CL 102 100* 98*  CO2 GLUCOSE 59* 190* 184*  BUN CREATININE 1.03* 0.91 0.73  CALCIUM 9.4 8.8* 9.0   Liver Function Tests:  Recent Labs Lab 05/22/15 2046  AST 19  ALT 16  ALKPHOS 69  BILITOT 0.8  PROT 7.2  ALBUMIN 3.4*   CBC:  Recent Labs Lab 05/22/15 2046 05/23/15 0549  WBC 8.2 6.8  NEUTROABS 5.5  --   HGB 10.6* 9.9*  HCT 33.0* 30.1*  MCV 83.1 82.9  PLT 246 224  BNP: BNP (last 3 results)  Recent Labs  04/09/15 1500  BNP 138.1*    CBG:  Recent Labs Lab 05/23/15 1702 05/24/15 0031 05/24/15 0456 05/24/15 0751 05/24/15 1153  GLUCAP 257* 300* 169* 177* 239*     IMAGING STUDIES Dg Chest Portable 1 View  05/22/2015  CLINICAL DATA:  4 minutes seizures  today. History of hypertension. History of CHF. EXAM: PORTABLE CHEST 1 VIEW COMPARISON:  04/07/2015 FINDINGS: Mild cardiac enlargement without vascular congestion. No focal airspace disease or consolidation in the lungs. No blunting of costophrenic angles. No pneumothorax. Calcified and tortuous aorta. Mediastinal contours appear intact. Degenerative changes in the spine and shoulders. IMPRESSION: Mild cardiac enlargement.  No evidence of active pulmonary disease. Electronically Signed   By: Burman NievesWilliam  Stevens M.D.   On: 05/22/2015 20:38    DISCHARGE EXAMINATION: Filed Vitals:   05/23/15 1414 05/23/15 1714 05/23/15 2131 05/24/15 0501  BP: 113/46 124/55 156/68 184/96  Pulse: 65 72 74 76  Temp: 98.6 F (37 C)  98.4 F (36.9 C) 98.4 F (36.9 C)  TempSrc: Oral  Oral Oral  Resp: 16  16 16   SpO2: 97%  100% 100%   General appearance: alert, cooperative, distracted and no distress Resp: clear to auscultation bilaterally Cardio: regular rate and rhythm, S1, S2 normal, no murmur, click, rub or gallop GI: soft, non-tender; bowel sounds normal; no masses,  no organomegaly Extremities: extremities normal, atraumatic, no cyanosis or edema  DISPOSITION: Home with home health  Discharge Instructions    Call MD for:  difficulty breathing, headache or visual disturbances    Complete by:  As directed      Call MD for:  extreme fatigue    Complete by:  As directed      Call MD for:  persistant dizziness or light-headedness    Complete by:  As directed      Call MD for:  persistant nausea and vomiting    Complete by:  As directed      Call MD for:  severe uncontrolled pain    Complete by:  As directed      Call MD for:  temperature >100.4    Complete by:  As directed      Diet Carb Modified    Complete by:  As directed      Discharge instructions    Complete by:  As directed   Please talk to Dr. Everardo AllEllison regarding her diabetes. Please check her blood sugars three times a day before meals and maintain  a record. Call Dr. Everardo AllEllison if you see values greater than 200 consistently (more than 3 times in a row). Also call if her blood sugars stay below 100.  You were cared for by a hospitalist during your hospital stay. If you have any questions about your discharge medications or the care you received while you were in the hospital after you are discharged, you can call the unit and asked to speak with the hospitalist on call if the hospitalist that took care of you is not available. Once you are discharged, your primary care physician will handle any further medical issues. Please note that NO REFILLS for any discharge medications will be authorized once you are discharged, as it is imperative that you return to your primary care physician (or establish a relationship with a primary care physician if you do not have one) for your aftercare needs so that they can reassess your need for medications and monitor your lab values. If you  do not have a primary care physician, you can call [phone] for a physician referral.     Increase activity slowly    Complete by:  As directed            ALLERGIES:  Allergies  Allergen Reactions  . Aspirin Nausea Only  . Codeine Nausea Only     Current Discharge Medication List    CONTINUE these medications which have CHANGED   Details  divalproex (DEPAKOTE ER) 250 MG 24 hr tablet Take 2 tablets (500 mg total) by mouth every morning. Qty: 60 tablet, Refills: 2    Insulin Lispro Prot & Lispro (HUMALOG MIX 50/50 KWIKPEN) (50-50) 100 UNIT/ML Kwikpen Inject 5 Units into the skin daily with breakfast. Qty: 15 mL, Refills: 2      CONTINUE these medications which have NOT CHANGED   Details  aspirin EC 325 MG tablet TAKE 1 TABLET (325 MG TOTAL) BY MOUTH DAILY. Qty: 30 tablet, Refills: 0    atorvastatin (LIPITOR) 20 MG tablet TAKE 1 TABLET BY MOUTH EVERY DAY Qty: 90 tablet, Refills: 0    carvedilol (COREG) 3.125 MG tablet Take 1 tablet (3.125 mg total) by mouth  2 (two) times daily with a meal. Qty: 60 tablet, Refills: 6    citalopram (CELEXA) 20 MG tablet TAKE 1 TABLET (20 MG TOTAL) BY MOUTH DAILY. Qty: 30 tablet, Refills: 3    donepezil (ARICEPT) 10 MG tablet Take 10 mg by mouth at bedtime.    furosemide (LASIX) 40 MG tablet Take 40 mg by mouth daily.     hyoscyamine (LEVSIN SL) 0.125 MG SL tablet PLACE 1 TABLET UNDER THE TONGUE TWICE DAILY AS NEEDED FOR CRAMPS AND SPASMS. Qty: 60 tablet, Refills: 0    lisinopril (PRINIVIL,ZESTRIL) 2.5 MG tablet TAKE 1 TABLET (2.5 MG TOTAL) BY MOUTH EVERY MORNING. Qty: 30 tablet, Refills: 3    loperamide (IMODIUM) 2 MG capsule Take 1 capsule (2 mg total) by mouth as needed for diarrhea or loose stools. Qty: 30 capsule, Refills: 0    memantine (NAMENDA XR) 28 MG CP24 24 hr capsule Take 28 mg by mouth at bedtime.     Propylene Glycol (SYSTANE BALANCE OP) Apply 1 drop to eye 3 (three) times daily as needed (dry eyes).    sodium chloride (OCEAN) 0.65 % SOLN nasal spray Place 2 sprays into both nostrils as needed for congestion.      STOP taking these medications     feeding supplement (BOOST / RESOURCE BREEZE) LIQD        Follow-up Information    Follow up with Romero Belling, MD. Schedule an appointment as soon as possible for a visit in 1 week.   Specialty:  Endocrinology   Why:  post hospitalization follow up   Contact information:   [address] [place] [address] [phone]       Follow up with [facility].   Specialty:  Home Health Services   Why:  [facility], PT, OT, aide and social worker   Contact information:   [address] [place] [address] [phone]       TOTAL DISCHARGE TIME: 35 minutes  [facility]  [facility] Pager [phone]  05/24/2015, 1:11 PM

## 2015-05-24 NOTE — Telephone Encounter (Signed)
ok 

## 2015-05-24 NOTE — Progress Notes (Signed)
Inpatient Diabetes Program Recommendations  AACE/ADA: New Consensus Statement on Inpatient Glycemic Control (2015)  Target Ranges:  Prepandial:   less than 140 mg/dL      Peak postprandial:   less than 180 mg/dL (1-2 hours)      Critically ill patients:  140 - 180 mg/dL    Results for Dana [name], Hiyab M (MRN [id number]) as of 05/24/2015 10:04  Ref. Range 05/22/2015 20:16 05/22/2015 20:39 05/22/2015 21:32 05/22/2015 23:06 05/23/2015 02:42 05/23/2015 04:10 05/23/2015 05:52 05/23/2015 07:56 05/23/2015 12:33 05/23/2015 17:02 05/24/2015 00:31 05/24/2015 04:56 05/24/2015 07:51  Glucose-Capillary Latest Ref Range: 65-99 mg/dL 48 (L) 64 (L) 87 45 (L) 232 (H) 196 (H) 199 (H) 158 (H) 186 (H) 257 (H) 300 (H) 169 (H) 177 (H)     Admit Seizures/ Hypoglycemia.   History: DM, Severe Dementia, CHF  Home Insulin: Humalog 50/50 insulin- 25 units daily (not sure if pt taking??)  ENDO: Dr. Everardo AllEllison  Current Insulin Orders: None     MD- Note patient admitted with severe Hypoglycemia.  Patient now having occasional CBGs >200 mg/dl.  MD- Do you want to start Novolog Sensitive SSI (0-9 units) Q4 hours?     --Will follow patient during hospitalization--  Ambrose FinlandJeannine Johnston Camillia Marcy RN, MSN, CDE Diabetes Coordinator Inpatient Glycemic Control Team Team Pager: [phone] (8a-5p)

## 2015-05-24 NOTE — Progress Notes (Signed)
Patient was discharged home with home health by MD order; discharged instructions  review and give to patient's son with care notes and prescriptions; IV DIC; patient will be escorted to the car by nurse tech via wheelchair.

## 2015-05-24 NOTE — Telephone Encounter (Signed)
I contacted Drinda Buttsnnette and gave the verbal order for OT, nurse evaluation and social worker.

## 2015-05-24 NOTE — Telephone Encounter (Signed)
Dana [name] from [facility], stated patien need a Social worker, Occupational Therapy to come out this weekend to her house, please advise [phone]

## 2015-05-24 NOTE — Telephone Encounter (Signed)
See notes below and please advise, Thanks! 

## 2015-05-25 DIAGNOSIS — M6281 Muscle weakness (generalized): Secondary | ICD-10-CM | POA: Diagnosis not present

## 2015-05-25 DIAGNOSIS — G40909 Epilepsy, unspecified, not intractable, without status epilepticus: Secondary | ICD-10-CM | POA: Diagnosis not present

## 2015-05-25 DIAGNOSIS — I110 Hypertensive heart disease with heart failure: Secondary | ICD-10-CM | POA: Diagnosis not present

## 2015-05-25 DIAGNOSIS — E11649 Type 2 diabetes mellitus with hypoglycemia without coma: Secondary | ICD-10-CM | POA: Diagnosis not present

## 2015-05-25 DIAGNOSIS — F0390 Unspecified dementia without behavioral disturbance: Secondary | ICD-10-CM | POA: Diagnosis not present

## 2015-05-27 ENCOUNTER — Other Ambulatory Visit: Payer: Self-pay

## 2015-05-27 DIAGNOSIS — E11649 Type 2 diabetes mellitus with hypoglycemia without coma: Secondary | ICD-10-CM | POA: Diagnosis not present

## 2015-05-27 DIAGNOSIS — M6281 Muscle weakness (generalized): Secondary | ICD-10-CM | POA: Diagnosis not present

## 2015-05-27 DIAGNOSIS — G40909 Epilepsy, unspecified, not intractable, without status epilepticus: Secondary | ICD-10-CM | POA: Diagnosis not present

## 2015-05-27 DIAGNOSIS — I110 Hypertensive heart disease with heart failure: Secondary | ICD-10-CM | POA: Diagnosis not present

## 2015-05-27 DIAGNOSIS — F0390 Unspecified dementia without behavioral disturbance: Secondary | ICD-10-CM | POA: Diagnosis not present

## 2015-05-27 NOTE — Patient Outreach (Signed)
[facility]) Care Management  05/27/2015  Minda MeoShirley M Ahlgrim 04/04/1946 [id number]  Assessment: 69 year old admitted 11/2-11/4 with seizure/hypoglycemia. Call for transition of care. No answer. No voicemail available.  Plan: RNCM will call tomorrow.  Kathyrn SheriffJuana Verlena Marlette, RN, MSN, [facility] Center[facility],CCM [facility] Community Care Coordinator Cell: [phone]

## 2015-05-28 ENCOUNTER — Other Ambulatory Visit: Payer: Self-pay

## 2015-05-28 DIAGNOSIS — E11649 Type 2 diabetes mellitus with hypoglycemia without coma: Secondary | ICD-10-CM | POA: Diagnosis not present

## 2015-05-28 DIAGNOSIS — M6281 Muscle weakness (generalized): Secondary | ICD-10-CM | POA: Diagnosis not present

## 2015-05-28 DIAGNOSIS — R531 Weakness: Secondary | ICD-10-CM | POA: Diagnosis not present

## 2015-05-28 DIAGNOSIS — G40909 Epilepsy, unspecified, not intractable, without status epilepticus: Secondary | ICD-10-CM | POA: Diagnosis not present

## 2015-05-28 DIAGNOSIS — F0390 Unspecified dementia without behavioral disturbance: Secondary | ICD-10-CM | POA: Diagnosis not present

## 2015-05-28 DIAGNOSIS — I110 Hypertensive heart disease with heart failure: Secondary | ICD-10-CM | POA: Diagnosis not present

## 2015-05-28 NOTE — Patient Outreach (Signed)
[facility] Care Management  05/28/2015  Dana [name] 08/04/1945 [id number]  Transition of care call-recent admission 11/2-11/4 with seizure, hypoglycemia. Transition of care call completed. Member with advanced dementia, therefore RNCM spoke with member's husband(caregiver). Dana [name] reports home health has been out to see [name]([facility]). Dana [name] reports member has a follow up appointment scheduled with primary care on tomorrow and he plans to get someone to help him take member to this appointment. Dana [name] reports member has not had any low blood sugars. Blood sugar this morning was 270 and at 2:12pm it was 346.  Medications reviewed. Dana [name] states he has all the medications ordered.  Dana [name] reports that Dana [name] from palliative care is on the other line trying to reach him. RNCM encouraged Dana [name] to call if any questions or problems.  Plan: home visit next week.  Dana SheriffJuana Alenah Sarria, RN, MSN, [facility],CCM [facility] Community Care Coordinator Cell: [phone]

## 2015-05-29 ENCOUNTER — Ambulatory Visit (INDEPENDENT_AMBULATORY_CARE_PROVIDER_SITE_OTHER): Payer: Commercial Managed Care - HMO | Admitting: Endocrinology

## 2015-05-29 ENCOUNTER — Encounter: Payer: Self-pay | Admitting: Endocrinology

## 2015-05-29 ENCOUNTER — Other Ambulatory Visit: Payer: Self-pay

## 2015-05-29 VITALS — BP 138/78 | HR 70 | Temp 98.0°F | Ht 63.0 in | Wt 156.0 lb

## 2015-05-29 DIAGNOSIS — R634 Abnormal weight loss: Secondary | ICD-10-CM | POA: Diagnosis not present

## 2015-05-29 MED ORDER — SITAGLIP PHOS-METFORMIN HCL ER 50-1000 MG PO TB24: 2.0000 | ORAL_TABLET | Freq: Every day | ORAL | Status: DC

## 2015-05-29 MED ORDER — SITAGLIPTIN PHOSPHATE 100 MG PO TABS: 100.0000 mg | ORAL_TABLET | Freq: Every day | ORAL | Status: DC

## 2015-05-29 NOTE — Patient Outreach (Signed)
[facility] Care Management  05/28/2015  Dana [name] 09/02/1945 [id number]  Assessment: Received call from Hospice and palliative care nurse practitioner stating that he has been out to see member. [name] discussed difficulty getting member out to provider office visits. Discussed home provider option.  Plan: continue to follow  Dana SheriffJuana Mackynzie Woolford, RN, MSN, [facility]ronBS[facility],CCM [facility] Community Care Coordinator Cell: [phone]

## 2015-05-29 NOTE — Progress Notes (Signed)
Subjective:    Patient ID: Dana [name], female    DOB: Oct 01, 1945, 69 y.o.   MRN: [id number]  HPI  The state of at least three ongoing medical problems is addressed today, with interval history of each noted here: Pt returns for f/u of diabetes mellitus: DM type: 2 Dx'ed: 1987 Complications: none Therapy: insulin since approx 1990. GDM: never DKA: never Severe hypoglycemia: several times in 2016.   Pancreatitis: never Other: therapy has been limited by severe noncompliance and dementia; husband gives her insulin injections (she therefore needs a simple qd insulin regimen), and he also checks cbg's; she was admitted in march of 2014, with severe hyperglycemia.  Interval history: Since last ov, she has again been hospitalized with severe hypoglycemia.  I asked husband about cbg's.  He says cbg's have been 200-300.   Weight loss: I asked husband, who says pt's appetite is poor.   Dementia: husb says unchanged. Past Medical History  Diagnosis Date  . ALLERGIC RHINITIS 02/08/2007  . ANXIETY 02/08/2007  . DEPRESSION 02/08/2007  . DIABETES MELLITUS, TYPE I 02/08/2007  . Headache(784.0) 11/06/2008  . HYPERCHOLESTEROLEMIA 11/05/2008  . HYPERTENSION 02/08/2007  . URINARY INCONTINENCE 02/08/2007  . Dyslipidemia   . Non-ulcer dyspepsia   . Leukopenia   . DM retinopathy (HCC)     background  . 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 Dementia   . CHF (congestive heart failure) (HCC)   . Anemia     takes iron  . Skin abnormality     both knees with opne ares and left shoulder with open areas, changes dressings prn    Past Surgical History  Procedure Laterality Date  . Total knee arthroplasty  1990    left  . Panendoscopy  10/27/1999  . Stress cardiolite  12/14/2003  . Electrocardiogram  05/17/2006  . Abdominal hysterectomy    . Bladder tach  years ago  . Colonoscopy with propofol N/A 06/21/2014    Procedure: COLONOSCOPY WITH PROPOFOL;  Surgeon: Rachael Fee, MD;  Location:  [facility];  Service: Endoscopy;  Laterality: N/A;    Social History   Social History  . Marital Status: Married    Spouse Name: N/A  . Number of Children: 2  . Years of Education: N/A   Occupational History  . Retired    Social History Main Topics  . Smoking status: Never Smoker   . Smokeless tobacco: Never Used  . Alcohol Use: No  . Drug Use: No  . Sexual Activity: No   Other Topics Concern  . Not on file   Social History Narrative    Current Outpatient Prescriptions on File Prior to Visit  Medication Sig Dispense Refill  . aspirin EC 325 MG tablet TAKE 1 TABLET (325 MG TOTAL) BY MOUTH DAILY. 30 tablet 0  . atorvastatin (LIPITOR) 20 MG tablet TAKE 1 TABLET BY MOUTH EVERY DAY 90 tablet 0  . carvedilol (COREG) 3.125 MG tablet Take 1 tablet (3.125 mg total) by mouth 2 (two) times daily with a meal. (Patient taking differently: Take 3.125 mg by mouth daily. ) 60 tablet 6  . citalopram (CELEXA) 20 MG tablet TAKE 1 TABLET (20 MG TOTAL) BY MOUTH DAILY. 30 tablet 3  . divalproex (DEPAKOTE ER) 250 MG 24 hr tablet Take 2 tablets (500 mg total) by mouth every morning. 60 tablet 2  . donepezil (ARICEPT) 10 MG tablet Take 10 mg by mouth at bedtime.    . furosemide (  LASIX) 40 MG tablet Take 40 mg by mouth daily.     . hyoscyamine (LEVSIN SL) 0.125 MG SL tablet PLACE 1 TABLET UNDER THE TONGUE TWICE DAILY AS NEEDED FOR CRAMPS AND SPASMS. 60 tablet 0  . lisinopril (PRINIVIL,ZESTRIL) 2.5 MG tablet TAKE 1 TABLET (2.5 MG TOTAL) BY MOUTH EVERY MORNING. 30 tablet 3  . loperamide (IMODIUM) 2 MG capsule Take 1 capsule (2 mg total) by mouth as needed for diarrhea or loose stools. 30 capsule 0  . memantine (NAMENDA XR) 28 MG CP24 24 hr capsule Take 28 mg by mouth at bedtime.     Marland Kitchen. Propylene Glycol (SYSTANE BALANCE OP) Apply 1 drop to eye 3 (three) times daily as needed (dry eyes).    . sodium chloride (OCEAN) 0.65 % SOLN nasal spray Place 2 sprays into both nostrils as needed for congestion.      No current facility-administered medications on file prior to visit.    Allergies  Allergen Reactions  . Aspirin Nausea Only  . Codeine Nausea Only    Family History  Problem Relation Age of Onset  . Alcohol abuse Son   . Cancer Neg Hx     no cancer in immediate family  . CAD Father   . Colon cancer Neg Hx   . Colon polyps Neg Hx   . Diabetes Father   . Kidney disease Neg Hx   . Esophageal cancer Neg Hx   . Gallbladder disease Neg Hx     BP 138/78 mmHg  Pulse 70  Temp(Src) 98 F (36.7 C) (Oral)  Ht 5\' 3"  (1.6 m)  Wt 156 lb (70.761 kg)  BMI 27.64 kg/m2  SpO2 97%   Review of Systems Pt denies difficulty chewing.  She denies dysphagia.      Objective:   Physical Exam VITAL SIGNS:  See vs page GENERAL: no distress.  In wheelchair.  ABDOMEN: abdomen is soft, nontender.  no hepatosplenomegaly.  not distended.  no hernia.       Assessment & Plan:  Weight loss, worse, uncertain etiology.   DM: overcontrolled due to weight loss.  She probably does not need insulin now.   Dementia: in this setting, oral DM rx is preferred.   Patient is advised the following: Patient Instructions  i have sent a prescription to your pharmacy, to change the insulin to "januvia."   Please call us next week, to tell us how the blood sugar is doing.   check your blood sugar once a day.  vary the time of day when you check, between before the 3 meals, and at bedtime.  also check if you have symptoms of your blood sugar being too high or too low.  please keep a record of the readings and bring it to your next appointment here (or you can bring the meter itself).  You can write it on any piece of paper.  please call us sooner if your blood sugar goes below 70, or if you have a lot of readings over 200. Please see a specialist about the weight loss.  you will receive a phone call, about a day and time for an appointment. Please come back for a follow-up appointment in 1 month.

## 2015-05-29 NOTE — Patient Instructions (Addendum)
i have sent a prescription to your pharmacy, to change the insulin to "januvia."   Please call us next week, to tell us how the blood sugar is doing.   check your blood sugar once a day.  vary the time of day when you check, between before the 3 meals, and at bedtime.  also check if you have symptoms of your blood sugar being too high or too low.  please keep a record of the readings and bring it to your next appointment here (or you can bring the meter itself).  You can write it on any piece of paper.  please call us sooner if your blood sugar goes below 70, or if you have a lot of readings over 200. Please see a specialist about the weight loss.  you will receive a phone call, about a day and time for an appointment. Please come back for a follow-up appointment in 1 month.

## 2015-05-29 NOTE — Patient Outreach (Addendum)
[facility] Care Management  05/28/2015  Dana MeoShirley M [name] 10/04/1945 [id number]  Care Coordination: RNCM called Hospice-Member is not eligible for Hospice full benefits, but will have nurse practioner follow monthly for palliative care. RNCM spoke with Nurse Practitioner, Josh who reports he will be seeing member later today and will follow up with RNCM.  Plan: await follow up.  Kathyrn SheriffJuana Stori Royse, RN, MSN, [facility]BS[facility],CCM [facility] Community Care Coordinator Cell: [phone]

## 2015-05-31 ENCOUNTER — Telehealth: Payer: Self-pay | Admitting: Endocrinology

## 2015-05-31 DIAGNOSIS — G40909 Epilepsy, unspecified, not intractable, without status epilepticus: Secondary | ICD-10-CM | POA: Diagnosis not present

## 2015-05-31 DIAGNOSIS — M6281 Muscle weakness (generalized): Secondary | ICD-10-CM | POA: Diagnosis not present

## 2015-05-31 DIAGNOSIS — I110 Hypertensive heart disease with heart failure: Secondary | ICD-10-CM | POA: Diagnosis not present

## 2015-05-31 DIAGNOSIS — F0390 Unspecified dementia without behavioral disturbance: Secondary | ICD-10-CM | POA: Diagnosis not present

## 2015-05-31 DIAGNOSIS — E11649 Type 2 diabetes mellitus with hypoglycemia without coma: Secondary | ICD-10-CM | POA: Diagnosis not present

## 2015-05-31 NOTE — Telephone Encounter (Signed)
[facility] therapy with the pt. Please advise if order can be given.

## 2015-05-31 NOTE — Telephone Encounter (Signed)
I contacted [facility] and left a voicemail providing the verbal PT order.

## 2015-05-31 NOTE — Telephone Encounter (Signed)
ok 

## 2015-05-31 NOTE — Telephone Encounter (Signed)
[name] clarification on a medication for patient.

## 2015-05-31 NOTE — Telephone Encounter (Signed)
[facility] to receive a [facility] order for PT   Call back: [phone]  Please advise   Thank you

## 2015-05-31 NOTE — Telephone Encounter (Signed)
See note below and please advise, Thanks! 

## 2015-05-31 NOTE — Telephone Encounter (Signed)
I contacted [facility] a gave verbal order for the pt's speech therapy.

## 2015-06-03 ENCOUNTER — Telehealth: Payer: Self-pay | Admitting: Endocrinology

## 2015-06-03 NOTE — Telephone Encounter (Signed)
please call patient: Is she taking janumet? How are cbg's doing?

## 2015-06-03 NOTE — Telephone Encounter (Signed)
See note below and please advise, Thanks! 

## 2015-06-03 NOTE — Telephone Encounter (Signed)
Patient husband has patient b/s reading to give you.

## 2015-06-03 NOTE — Telephone Encounter (Signed)
Patient Name: Dana DraftsSHIRLEY [name] Gender: Female DOB: 07/30/1945 Age: 69 Y 3 M 8 D Return Phone Number: Address: City/State/Zip: [place] StatisticianClient [facility] - Client Client Site [facility] Physician Romero BellingEllison, Sean Contact Type Call Call Type Triage / Clinical Caller Name [name] Relationship To Patient Provider Return Phone Number Please choose phone number Chief Complaint Paging or Request for Consult Initial Comment [facility] Pt's blood sugar was 354 cb [phone] PreDisposition InappropriateToAsk Nurse Assessment Nurse: Ladona RidgelGaddy, RN, Felicia Date/Time (Eastern Time): 05/31/2015 5:22:37 PM Confirm and document reason for call. If symptomatic, describe symptoms. ---PT had 317 blood sugar this am and the caregiver did not give Venezuel[place]uvia - Home health nurse told him to give the [place]uvia. But it was a new medication and some question in nurse mind about whether the humalog was to be discontinued. It was discontinued per her conversation just now with office. Since he missed Venezuelajanuvia they gave it this afternoon about 5 pm and now the blood sugar is 354. The home health nurse has not seen pt since this am at 10 am so will conference in the caregiver. No humalog was given today. Husband is caregiver. [phone] - she had hospitalization 11/2 to 11/4 for low blood sugar then Humalog was lowered at discharge. Now only on januvia - no other blood sugar medication

## 2015-06-04 ENCOUNTER — Telehealth: Payer: Self-pay | Admitting: Endocrinology

## 2015-06-04 ENCOUNTER — Other Ambulatory Visit: Payer: Self-pay

## 2015-06-04 DIAGNOSIS — E11649 Type 2 diabetes mellitus with hypoglycemia without coma: Secondary | ICD-10-CM | POA: Diagnosis not present

## 2015-06-04 DIAGNOSIS — M6281 Muscle weakness (generalized): Secondary | ICD-10-CM | POA: Diagnosis not present

## 2015-06-04 DIAGNOSIS — F0390 Unspecified dementia without behavioral disturbance: Secondary | ICD-10-CM | POA: Diagnosis not present

## 2015-06-04 DIAGNOSIS — I110 Hypertensive heart disease with heart failure: Secondary | ICD-10-CM | POA: Diagnosis not present

## 2015-06-04 DIAGNOSIS — G40909 Epilepsy, unspecified, not intractable, without status epilepticus: Secondary | ICD-10-CM | POA: Diagnosis not present

## 2015-06-04 MED ORDER — GLIMEPIRIDE 1 MG PO TABS
1.0000 mg | ORAL_TABLET | Freq: Every day | ORAL | Status: DC
Start: 2015-06-04 — End: 2015-07-30

## 2015-06-04 NOTE — Telephone Encounter (Signed)
Ok for [employer] and urine c/s

## 2015-06-04 NOTE — Telephone Encounter (Signed)
Donn PieriniJuana from [facility] called stated that  patient B/p is elevated rt arm 184/98, left arm 180/ 82, call and advise Husband on what to do

## 2015-06-04 NOTE — Telephone Encounter (Signed)
bp was normal here last week. Therefore, please continue the same medications for now, and recheck the blood pressure in a few days.

## 2015-06-04 NOTE — Telephone Encounter (Signed)
Please see note from [facility] and please advise also on that order.

## 2015-06-04 NOTE — Telephone Encounter (Signed)
i have sent a prescription to your pharmacy, to add "glimepiride." i'll see you next time.

## 2015-06-04 NOTE — Telephone Encounter (Signed)
I contacted the pt's husband and advised of note below. Pt's husband advised of note below and voiced understanding.

## 2015-06-04 NOTE — Telephone Encounter (Signed)
I contacted the husband and advised to continue the same bp mediciations at this time. I also contacted [name] with [facility] and provided verbal order for Urine analysis.

## 2015-06-04 NOTE — Telephone Encounter (Signed)
I contacted the pt's husband. Blood sugar readings are listed below.  05/31/2015: 12 pm: 317  3 pm: 456  7 pm: 345  06/01/2015: 11 am: 249 2 pm: 266 5 pm: 202  06/02/2015: 2 am: 204 2:30 pm: 193 8 pm: 176  06/03/2015: 3 am: 184 11 am: 196 4 pm: 232  06/04/2015: 9 am: 241  Currently the pt is taking Janumet 50-1000.

## 2015-06-04 NOTE — Telephone Encounter (Signed)
See note below and please advise, Thanks! 

## 2015-06-04 NOTE — Telephone Encounter (Signed)
See note below and please advise, thanks!  

## 2015-06-04 NOTE — Telephone Encounter (Signed)
[name] with [facility] # [phone]  Needs order for UA with culture and sensitivity  Pt has had higher BS, urine is very strong, has been more confused recently

## 2015-06-05 ENCOUNTER — Other Ambulatory Visit: Payer: Self-pay

## 2015-06-05 ENCOUNTER — Telehealth: Payer: Self-pay | Admitting: Endocrinology

## 2015-06-05 DIAGNOSIS — I110 Hypertensive heart disease with heart failure: Secondary | ICD-10-CM | POA: Diagnosis not present

## 2015-06-05 DIAGNOSIS — E11649 Type 2 diabetes mellitus with hypoglycemia without coma: Secondary | ICD-10-CM | POA: Diagnosis not present

## 2015-06-05 DIAGNOSIS — G40909 Epilepsy, unspecified, not intractable, without status epilepticus: Secondary | ICD-10-CM | POA: Diagnosis not present

## 2015-06-05 DIAGNOSIS — M6281 Muscle weakness (generalized): Secondary | ICD-10-CM | POA: Diagnosis not present

## 2015-06-05 DIAGNOSIS — F0390 Unspecified dementia without behavioral disturbance: Secondary | ICD-10-CM | POA: Diagnosis not present

## 2015-06-05 NOTE — Patient Outreach (Signed)
[facility]N) Care Management  [facility]  06/05/2015   Minda MeoShirley M Sterkel 09/21/1945 [id number]  Subjective: Member denies any questions or problems  Objective: BP 179/94 mmHg  Pulse 78  Resp 16  SpO2 99%, lungs with end inspiratory crackles noted lower lobes bilaterally, after several deep breaths right side cleared and left side improved.   Current Medications:  Current Outpatient Prescriptions  Medication Sig Dispense Refill  . aspirin EC 325 MG tablet TAKE 1 TABLET (325 MG TOTAL) BY MOUTH DAILY. 30 tablet 0  . atorvastatin (LIPITOR) 20 MG tablet TAKE 1 TABLET BY MOUTH EVERY DAY 90 tablet 0  . carvedilol (COREG) 3.125 MG tablet Take 1 tablet (3.125 mg total) by mouth 2 (two) times daily with a meal. (Patient taking differently: Take 3.125 mg by mouth daily. ) 60 tablet 6  . citalopram (CELEXA) 20 MG tablet TAKE 1 TABLET (20 MG TOTAL) BY MOUTH DAILY. 30 tablet 3  . divalproex (DEPAKOTE ER) 250 MG 24 hr tablet Take 2 tablets (500 mg total) by mouth every morning. 60 tablet 2  . donepezil (ARICEPT) 10 MG tablet Take 10 mg by mouth at bedtime.    . furosemide (LASIX) 40 MG tablet Take 40 mg by mouth daily.     . hyoscyamine (LEVSIN SL) 0.125 MG SL tablet PLACE 1 TABLET UNDER THE TONGUE TWICE DAILY AS NEEDED FOR CRAMPS AND SPASMS. 60 tablet 0  . lisinopril (PRINIVIL,ZESTRIL) 2.5 MG tablet TAKE 1 TABLET (2.5 MG TOTAL) BY MOUTH EVERY MORNING. 30 tablet 3  . loperamide (IMODIUM) 2 MG capsule Take 1 capsule (2 mg total) by mouth as needed for diarrhea or loose stools. 30 capsule 0  . memantine (NAMENDA XR) 28 MG CP24 24 hr capsule Take 28 mg by mouth at bedtime.     Marland Kitchen. Propylene Glycol (SYSTANE BALANCE OP) Apply 1 drop to eye 3 (three) times daily as needed (dry eyes).    . SitaGLIPtin-MetFORMIN HCl (JANUMET XR) 50-1000 MG TB24 Take 2 tablets by mouth daily. 60 tablet 11  . sodium chloride (OCEAN) 0.65 % SOLN nasal spray Place 2 sprays into both nostrils as needed for  congestion.    Marland Kitchen. glimepiride (AMARYL) 1 MG tablet Take 1 tablet (1 mg total) by mouth daily with breakfast. 30 tablet 11   No current facility-administered medications for this visit.    Assessment: 69 year old with recent admission 11/2-11/4 with hypoglycemia. Member with history of advanced dementia(husband is primary care giver), non ambulatory. Incontinent-RNCM observed skin-no breakdown noted.   Mr. [name] reports he was able to get member to primary care appointment for follow up with assistance a family member. Mr. [name] reports he is able to take member to her follow up appointments with current physicians and is going to continue with the current plan. Home health continues to be involved.  History of diabetes- 7 day average 255 number=18; 14 day average 261 number=30, 30 day average 226 number = 57. Member currently on Janumet and amaryl was added today. Mr. [name] reports he has to pick this medication up from the pharmacy.  Blood pressure increased-Primary care office called and informed. Member has automatic blood pressure cuff but could not confirm accuracy due to no batteries. Mr. [name] to get batteries.   Plan: Care Coordination with home health agency, telephonic follow up next week.  Kathyrn SheriffJuana Raziah Funnell, RN, MSN, [facility],CCM [facility] Community Care Coordinator Cell: [phone]

## 2015-06-05 NOTE — Patient Outreach (Signed)
[facility]) Care Management  06/05/2015  Minda MeoShirley M Hands 01/08/1946 [id number]  Care Coordination: RNCM called [facility]-spoke with nurse Herbert SetaHeather. Informed of member's increased blood pressure. Herbert SetaHeather reports that member's pressures have ranged systolic 120's-185. RNCM informed Herbert SetaHeather that primary care office was notified of blood pressure readings [facility].  Plan: Home health to contact RNCM as needed. RNCM will continue to follow.  Kathyrn SheriffJuana Attallah Ontko, RN, MSN, [facility],CCM [facility] Community Care Coordinator Cell: [phone]

## 2015-06-05 NOTE — Telephone Encounter (Signed)
[name] stated she need more nursing visits with patient. [phone]

## 2015-06-06 DIAGNOSIS — M6281 Muscle weakness (generalized): Secondary | ICD-10-CM | POA: Diagnosis not present

## 2015-06-06 DIAGNOSIS — G40909 Epilepsy, unspecified, not intractable, without status epilepticus: Secondary | ICD-10-CM | POA: Diagnosis not present

## 2015-06-06 DIAGNOSIS — I110 Hypertensive heart disease with heart failure: Secondary | ICD-10-CM | POA: Diagnosis not present

## 2015-06-06 DIAGNOSIS — E11649 Type 2 diabetes mellitus with hypoglycemia without coma: Secondary | ICD-10-CM | POA: Diagnosis not present

## 2015-06-06 DIAGNOSIS — F0390 Unspecified dementia without behavioral disturbance: Secondary | ICD-10-CM | POA: Diagnosis not present

## 2015-06-06 NOTE — Telephone Encounter (Signed)
I contacted [name] with [facility] and gave verbal ok for the nurse visits per Dr. Everardo AllEllison.

## 2015-06-06 NOTE — Telephone Encounter (Signed)
See note below and please advise, Thanks! 

## 2015-06-06 NOTE — Telephone Encounter (Signed)
ok 

## 2015-06-07 ENCOUNTER — Telehealth: Payer: Self-pay | Admitting: Endocrinology

## 2015-06-07 DIAGNOSIS — M6281 Muscle weakness (generalized): Secondary | ICD-10-CM | POA: Diagnosis not present

## 2015-06-07 DIAGNOSIS — E11649 Type 2 diabetes mellitus with hypoglycemia without coma: Secondary | ICD-10-CM | POA: Diagnosis not present

## 2015-06-07 DIAGNOSIS — F0390 Unspecified dementia without behavioral disturbance: Secondary | ICD-10-CM | POA: Diagnosis not present

## 2015-06-07 DIAGNOSIS — I110 Hypertensive heart disease with heart failure: Secondary | ICD-10-CM | POA: Diagnosis not present

## 2015-06-07 DIAGNOSIS — G40909 Epilepsy, unspecified, not intractable, without status epilepticus: Secondary | ICD-10-CM | POA: Diagnosis not present

## 2015-06-07 NOTE — Telephone Encounter (Signed)
please call patient: i received UA result.  No rx is needed

## 2015-06-10 NOTE — Telephone Encounter (Signed)
Attempted to reach the pt's husband. Will try again at a later time.

## 2015-06-11 ENCOUNTER — Encounter: Payer: Self-pay | Admitting: Endocrinology

## 2015-06-11 ENCOUNTER — Other Ambulatory Visit: Payer: Self-pay

## 2015-06-11 DIAGNOSIS — I5042 Chronic combined systolic (congestive) and diastolic (congestive) heart failure: Secondary | ICD-10-CM | POA: Diagnosis not present

## 2015-06-11 DIAGNOSIS — G309 Alzheimer's disease, unspecified: Secondary | ICD-10-CM | POA: Diagnosis not present

## 2015-06-11 DIAGNOSIS — I6789 Other cerebrovascular disease: Secondary | ICD-10-CM | POA: Diagnosis not present

## 2015-06-11 DIAGNOSIS — I10 Essential (primary) hypertension: Secondary | ICD-10-CM | POA: Diagnosis not present

## 2015-06-11 NOTE — Patient Outreach (Signed)
[facility]) Care Management  06/11/2015  Dana [name] 06/27/1946 [id number]  Assessment: Call for transition of care. No answer. Unable to leave message.  Plan: Transition of care call next week.  Dana SheriffJuana Terryn Redner, RN, MSN, [facility],CCM [facility] Community Care Coordinator Cell: [phone]

## 2015-06-12 DIAGNOSIS — M6281 Muscle weakness (generalized): Secondary | ICD-10-CM | POA: Diagnosis not present

## 2015-06-12 DIAGNOSIS — F0390 Unspecified dementia without behavioral disturbance: Secondary | ICD-10-CM | POA: Diagnosis not present

## 2015-06-12 DIAGNOSIS — G40909 Epilepsy, unspecified, not intractable, without status epilepticus: Secondary | ICD-10-CM | POA: Diagnosis not present

## 2015-06-12 DIAGNOSIS — I110 Hypertensive heart disease with heart failure: Secondary | ICD-10-CM | POA: Diagnosis not present

## 2015-06-12 DIAGNOSIS — E11649 Type 2 diabetes mellitus with hypoglycemia without coma: Secondary | ICD-10-CM | POA: Diagnosis not present

## 2015-06-15 DIAGNOSIS — I10 Essential (primary) hypertension: Secondary | ICD-10-CM | POA: Diagnosis not present

## 2015-06-15 DIAGNOSIS — G40909 Epilepsy, unspecified, not intractable, without status epilepticus: Secondary | ICD-10-CM | POA: Diagnosis not present

## 2015-06-15 DIAGNOSIS — M6281 Muscle weakness (generalized): Secondary | ICD-10-CM | POA: Diagnosis not present

## 2015-06-15 DIAGNOSIS — I110 Hypertensive heart disease with heart failure: Secondary | ICD-10-CM | POA: Diagnosis not present

## 2015-06-15 DIAGNOSIS — E11649 Type 2 diabetes mellitus with hypoglycemia without coma: Secondary | ICD-10-CM | POA: Diagnosis not present

## 2015-06-15 DIAGNOSIS — F0390 Unspecified dementia without behavioral disturbance: Secondary | ICD-10-CM | POA: Diagnosis not present

## 2015-06-17 ENCOUNTER — Other Ambulatory Visit: Payer: Self-pay

## 2015-06-17 ENCOUNTER — Telehealth: Payer: Self-pay | Admitting: Endocrinology

## 2015-06-17 ENCOUNTER — Ambulatory Visit: Payer: Self-pay | Admitting: Neurology

## 2015-06-17 NOTE — Telephone Encounter (Signed)
Patient Name: Dana [name] Gender: Female DOB: 06/29/1946 Age: 69 Y 3 M 22 D Return Phone Number: Address: City/State/Zip:  StatisticianClient [facility] Night - Client Client Site [facility] Physician Romero BellingEllison, Sean Contact Type Call Call Type Triage / Clinical Caller Name [name] Relationship To Patient Care Giver Return Phone Number Please choose phone number Chief Complaint Paging or Request for Consult Initial Comment [facility]er [name]pts blood sugar is 117-193 dates:11/20-today. Also request for 2 more nursing visits and she would like a verbal okay. ZO#[id number] Nurse Assessment Guidelines Guideline Title Affirmed Question Affirmed Notes Nurse Date/Time Lamount Cohen([facility]

## 2015-06-17 NOTE — Patient Outreach (Signed)
[facility]) Care Management  06/17/2015  Dana [name] 04/21/1946 [id number]  Assessment: Transition of Care call. No answer. Unable to leave message.  Plan: follow up call next week.  Dana SheriffJuana Chelcie Estorga, RN, MSN, [facility],CCM [facility] Community Care Coordinator Cell: [phone]

## 2015-06-17 NOTE — Telephone Encounter (Signed)
ok 

## 2015-06-18 NOTE — Telephone Encounter (Signed)
I contacted Heater with [facility] and gave verbal order for nurse visits.

## 2015-06-19 ENCOUNTER — Ambulatory Visit (INDEPENDENT_AMBULATORY_CARE_PROVIDER_SITE_OTHER): Payer: Commercial Managed Care - HMO | Admitting: Neurology

## 2015-06-19 ENCOUNTER — Telehealth: Payer: Self-pay | Admitting: Endocrinology

## 2015-06-19 ENCOUNTER — Encounter: Payer: Self-pay | Admitting: Neurology

## 2015-06-19 VITALS — BP 124/70 | HR 89 | Resp 16

## 2015-06-19 DIAGNOSIS — E1169 Type 2 diabetes mellitus with other specified complication: Secondary | ICD-10-CM | POA: Diagnosis not present

## 2015-06-19 DIAGNOSIS — F0390 Unspecified dementia without behavioral disturbance: Secondary | ICD-10-CM | POA: Diagnosis not present

## 2015-06-19 DIAGNOSIS — R569 Unspecified convulsions: Secondary | ICD-10-CM

## 2015-06-19 DIAGNOSIS — M6281 Muscle weakness (generalized): Secondary | ICD-10-CM | POA: Diagnosis not present

## 2015-06-19 DIAGNOSIS — F03C0 Unspecified dementia, severe, without behavioral disturbance, psychotic disturbance, mood disturbance, and anxiety: Secondary | ICD-10-CM

## 2015-06-19 DIAGNOSIS — I110 Hypertensive heart disease with heart failure: Secondary | ICD-10-CM | POA: Diagnosis not present

## 2015-06-19 DIAGNOSIS — E11649 Type 2 diabetes mellitus with hypoglycemia without coma: Secondary | ICD-10-CM | POA: Diagnosis not present

## 2015-06-19 DIAGNOSIS — G40909 Epilepsy, unspecified, not intractable, without status epilepticus: Secondary | ICD-10-CM | POA: Diagnosis not present

## 2015-06-19 MED ORDER — DIVALPROEX SODIUM ER 250 MG PO TB24: 500.0000 mg | ORAL_TABLET | Freq: Every morning | ORAL | Status: DC

## 2015-06-19 NOTE — Telephone Encounter (Signed)
This drug is outside the scope of my practice.   Please refer request to neurol

## 2015-06-19 NOTE — Telephone Encounter (Signed)
There was an increase increase in the dosing for the divalproex SODer 250 mg to 1 tab daily in the hospital it was increased to 2 tabs daily [facility]

## 2015-06-19 NOTE — Telephone Encounter (Signed)
Elana calling with [facility] P# [phone] F# [phone]  Elana is needing the order for hospice care and the medical records to include labs testing and demographics faxed to the # above

## 2015-06-19 NOTE — Telephone Encounter (Signed)
I contacted the pt's husband and advised of note below. He voiced understanding.  

## 2015-06-19 NOTE — Progress Notes (Signed)
NEUROLOGY FOLLOW UP OFFICE NOTE  Dana [name] [id number]  HISTORY OF PRESENT ILLNESS: I had the pleasure of seeing Dana [name] in follow-up in the neurology clinic on 06/19/2015.  The patient was last seen 3 months ago for seizures. She is again accompanied by her husband who helps supplement the history today. These have occurred in the setting of hypoglycemia. She was again admitted last 05/23/15 for a seizure, glucose level was in the 40s. Depakote dose was increased to 500mg  daily. She had been taking this for agitation and confusion due to dementia. Since then, she has been doing well. Insulin was switched to oral medication, and glucose levels have been better, >100 per husband. She has severe dementia and does not recall having a seizure. Family is concerned about diarrhea.   HPI: This is a 69 yo RH woman with a history of dementia, diabetes, CHF, hypertension, hyperlipidemia, with seizures that have occurred in the setting of hypoglycemia. Patient has no recollection of events, she reports feeling okay today. Her [facility] last 02/26/15 when after an episode of loss of consciousness while on the toilet. She told her husband she was not feeling well, vomited, then passed out for 3-4 minutes. She was noted to be sweaty and pale, shaky with foam around her mouth and eye rolling. She was making gurgling sounds as her body was jerking. She was more confused when she woke up. EMS found her covered in vomit and feces, blood sugar by EMS was 107. In the ER, her blood sugar was in the 40s, going up to mid-60s after D50. There was lactic acidosis, she was started on antibiotics and insulin regimen was adjusted. Her husband reported 4-5 similar milder episodes over the past 5 months, associated with low blood sugar, at one point it was in the low 30s. She had an EEG which showed mild diffuse slowing. There were no further episodes of altered mental status during her hospital stay, and she  was discharged to rehab.  Her husband of 51 years states that memory changes started 5-10 years ago, she has been on Aricept and Namenda. She is also on low dose Depakote for agitation and worsening confusion. She has some baseline confusion, but her husband feels this is getting worse. He denies any other episodes of myoclonic jerks, no staring/unresponsive episodes. There is no prior history of seizures. She had a normal birth and early development. There is no history of febrile convulsions, CNS infections such as meningitis/encephalitis, significant traumatic brain injury, neurosurgical procedures, or family history of seizures.  Diagnostic Data:  MRI brain 03/2015 showed foci of hemosiderin deposition and encephalomalacia in the left frontal lobe, left parietal lobe and both occipital lobes. These are most likely ischemic, or could be related to remote trauma. No acute infarct. There is moderate diffuse atrophy.  PAST MEDICAL HISTORY: Past Medical History  Diagnosis Date  . ALLERGIC RHINITIS 02/08/2007  . ANXIETY 02/08/2007  . DEPRESSION 02/08/2007  . DIABETES MELLITUS, TYPE I 02/08/2007  . Headache(784.0) 11/06/2008  . HYPERCHOLESTEROLEMIA 11/05/2008  . HYPERTENSION 02/08/2007  . URINARY INCONTINENCE 02/08/2007  . Dyslipidemia   . Non-ulcer dyspepsia   . Leukopenia   . DM retinopathy (HCC)     background  . 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 Dementia   . CHF (congestive heart failure) (HCC)   . Anemia     takes iron  . Skin abnormality     both  knees with opne ares and left shoulder with open areas, changes dressings prn    MEDICATIONS: Current Outpatient Prescriptions on File Prior to Visit  Medication Sig Dispense Refill  . aspirin EC 325 MG tablet TAKE 1 TABLET (325 MG TOTAL) BY MOUTH DAILY. 30 tablet 0  . atorvastatin (LIPITOR) 20 MG tablet TAKE 1 TABLET BY MOUTH EVERY DAY 90 tablet 0  . carvedilol (COREG) 3.125 MG tablet Take 1 tablet (3.125 mg total) by mouth  2 (two) times daily with a meal. (Patient taking differently: Take 3.125 mg by mouth daily. ) 60 tablet 6  . citalopram (CELEXA) 20 MG tablet TAKE 1 TABLET (20 MG TOTAL) BY MOUTH DAILY. 30 tablet 3  . divalproex (DEPAKOTE ER) 250 MG 24 hr tablet Take 2 tablets (500 mg total) by mouth every morning. 60 tablet 2  . donepezil (ARICEPT) 10 MG tablet Take 10 mg by mouth at bedtime.    . furosemide (LASIX) 40 MG tablet Take 40 mg by mouth daily.     Marland Kitchen glimepiride (AMARYL) 1 MG tablet Take 1 tablet (1 mg total) by mouth daily with breakfast. 30 tablet 11  . hyoscyamine (LEVSIN SL) 0.125 MG SL tablet PLACE 1 TABLET UNDER THE TONGUE TWICE DAILY AS NEEDED FOR CRAMPS AND SPASMS. 60 tablet 0  . lisinopril (PRINIVIL,ZESTRIL) 2.5 MG tablet TAKE 1 TABLET (2.5 MG TOTAL) BY MOUTH EVERY MORNING. 30 tablet 3  . loperamide (IMODIUM) 2 MG capsule Take 1 capsule (2 mg total) by mouth as needed for diarrhea or loose stools. 30 capsule 0  . memantine (NAMENDA XR) 28 MG CP24 24 hr capsule Take 28 mg by mouth at bedtime.     Marland Kitchen Propylene Glycol (SYSTANE BALANCE OP) Apply 1 drop to eye 3 (three) times daily as needed (dry eyes).    . SitaGLIPtin-MetFORMIN HCl (JANUMET XR) 50-1000 MG TB24 Take 2 tablets by mouth daily. 60 tablet 11  . sodium chloride (OCEAN) 0.65 % SOLN nasal spray Place 2 sprays into both nostrils as needed for congestion.     No current facility-administered medications on file prior to visit.    ALLERGIES: Allergies  Allergen Reactions  . Aspirin Nausea Only  . Codeine Nausea Only    FAMILY HISTORY: Family History  Problem Relation Age of Onset  . Alcohol abuse Son   . Cancer Neg Hx     no cancer in immediate family  . CAD Father   . Colon cancer Neg Hx   . Colon polyps Neg Hx   . Diabetes Father   . Kidney disease Neg Hx   . Esophageal cancer Neg Hx   . Gallbladder disease Neg Hx     SOCIAL HISTORY: Social History   Social History  . Marital Status: Married    Spouse Name: N/A  .  Number of Children: 2  . Years of Education: N/A   Occupational History  . Retired    Social History Main Topics  . Smoking status: Never Smoker   . Smokeless tobacco: Never Used  . Alcohol Use: No  . Drug Use: No  . Sexual Activity: No   Other Topics Concern  . Not on file   Social History Narrative    REVIEW OF SYSTEMS: Constitutional: No fevers, chills, or sweats, no generalized fatigue, change in appetite Eyes: No visual changes, double vision, eye pain Ear, nose and throat: No hearing loss, ear pain, nasal congestion, sore throat Cardiovascular: No chest pain, palpitations Respiratory:  No shortness of breath  at rest or with exertion, wheezes GastrointestinaI: No nausea, vomiting, diarrhea, abdominal pain, fecal incontinence Genitourinary:  No dysuria, urinary retention or frequency Musculoskeletal:  No neck pain, back pain Integumentary: No rash, pruritus, skin lesions Neurological: as above Psychiatric: No depression, insomnia, anxiety Endocrine: No palpitations, fatigue, diaphoresis, mood swings, change in appetite, change in weight, increased thirst Hematologic/Lymphatic:  No anemia, purpura, petechiae. Allergic/Immunologic: no itchy/runny eyes, nasal congestion, recent allergic reactions, rashes  PHYSICAL EXAM: Filed Vitals:   06/19/15 1523  BP: 124/70  Pulse: 89  Resp: 16   General: No acute distress, sitting in wheelchair Head:  Normocephalic/atraumatic Neck: supple, no paraspinal tenderness, full range of motion Heart:  Regular rate and rhythm Lungs:  Clear to auscultation bilaterally Back: No paraspinal tenderness Skin/Extremities: No rash, no edema Neurological Exam: alert and oriented to person, states she is in [facility] in [place], [place]. She says it is June 2003. President is [employer]. No aphasia or dysarthria. Fund of knowledge is reduced.  Recent and remote memory are impaired.  Attention and concentration are normal.    Able to name  objects and repeat phrases. Cranial nerves: Pupils equal, round, reactive to light.  Fundoscopic exam unremarkable, no papilledema. Extraocular movements intact with no nystagmus. Right homonymous hemianopsia. Facial sensation intact. No facial asymmetry. Tongue, uvula, palate midline.  Motor: Bulk and tone normal, muscle strength at least 4/5 throughout with no pronator drift.  Sensation to light touch intact.  No extinction to double simultaneous stimulation.  Deep tendon reflexes 2+ throughout, toes downgoing.  Finger to nose testing intact.  Gait not tested, sitting in wheelchair.  IMPRESSION: This is a 69 yo RH woman with a history of diabetes, hypertension, hyperlipidemia, severe dementia, with recurrent episodes of decreased responsiveness with report of shaking. Majority have occurred in the setting of hypoglycemia, most recently last 05/23/15 with glucose in the 40s.Marland Kitchen Her routine EEG showed mild diffuse slowing, MRI brain shows encephalomalacia in the left frontal, parietal, and bilateral occipital lobes. We again discussed hypoglycemic seizures and her husband reports that with medication adjustment, glucose levels have now been >100. She had been prescribed Depakote for dementia-related agitation, this was increased to  daily after the last hypoglycemic seizure. Continue current dose for now. Her family is concerned about diarrhea, this may be due to Aricept, however she has been taking this for a while now, they will discuss diarrhea with her PCP, if it is felt that Aricept is the culprit, this can be discontinued. Continue Namenda. She will follow-up in 6 months or earlier if needed.   Thank you for allowing me to participate in her care.  Please do not hesitate to call for any questions or concerns.  The duration of this appointment visit was 25 minutes of face-to-face time with the patient.  Greater than 50% of this time was spent in counseling, explanation of diagnosis, planning of further  management, and coordination of care.   Patrcia Dolly, M.D.   CC: Dr. Everardo All

## 2015-06-19 NOTE — Telephone Encounter (Signed)
See note below and please advise if we can refill. Thanks!  

## 2015-06-19 NOTE — Patient Instructions (Signed)
1. Continue Depakote 250mg : take 2 tablets daily 2. Continue Aricept and Namenda 3. Discuss diarrhea with GI doctor, we can always stop Aricept if this is the potential cause of diarrhea 4. Follow-up in 6 months

## 2015-06-19 NOTE — Telephone Encounter (Signed)
I contacted Dana [name] and advised medical records would need to be requested from our medical records department. Phone/Fax number provided.

## 2015-06-20 ENCOUNTER — Telehealth: Payer: Self-pay | Admitting: Endocrinology

## 2015-06-20 ENCOUNTER — Telehealth: Payer: Self-pay | Admitting: Neurology

## 2015-06-20 NOTE — Telephone Encounter (Signed)
[facility] asking for a verbal order to push back patient therapy appointmentt back to 07/01/15 to be  Discharged, please advise

## 2015-06-20 NOTE — Telephone Encounter (Signed)
See note below and please advise if we can provide the verbal order. Thanks!

## 2015-06-20 NOTE — Telephone Encounter (Signed)
Verbal order provided.

## 2015-06-20 NOTE — Telephone Encounter (Signed)
Returned call to pharmacist/Lisa. She wanted clarification of dose increase of Depakote ER 250 mg. Patient was increased to 2 tablets every morning.

## 2015-06-20 NOTE — Telephone Encounter (Signed)
Cory/[phone]//[facility]/ called to request most recent office notes to be faxed to// [phone]

## 2015-06-20 NOTE — Telephone Encounter (Signed)
No notation of patient being on hospice service.

## 2015-06-20 NOTE — Telephone Encounter (Signed)
ok 

## 2015-06-20 NOTE — Telephone Encounter (Signed)
[facility] [phone]/called concerning change in med dosage/

## 2015-06-24 ENCOUNTER — Telehealth: Payer: Self-pay | Admitting: Endocrinology

## 2015-06-24 ENCOUNTER — Telehealth: Payer: Self-pay | Admitting: Neurology

## 2015-06-24 DIAGNOSIS — I110 Hypertensive heart disease with heart failure: Secondary | ICD-10-CM | POA: Diagnosis not present

## 2015-06-24 DIAGNOSIS — F0390 Unspecified dementia without behavioral disturbance: Secondary | ICD-10-CM | POA: Diagnosis not present

## 2015-06-24 DIAGNOSIS — G40909 Epilepsy, unspecified, not intractable, without status epilepticus: Secondary | ICD-10-CM | POA: Diagnosis not present

## 2015-06-24 DIAGNOSIS — M6281 Muscle weakness (generalized): Secondary | ICD-10-CM | POA: Diagnosis not present

## 2015-06-24 DIAGNOSIS — E11649 Type 2 diabetes mellitus with hypoglycemia without coma: Secondary | ICD-10-CM | POA: Diagnosis not present

## 2015-06-24 MED ORDER — SITAGLIPTIN PHOS-METFORMIN HCL 50-1000 MG PO TABS: 1.0000 | ORAL_TABLET | Freq: Two times a day (BID) | ORAL | Status: DC

## 2015-06-24 MED ORDER — MEMANTINE HCL 10 MG PO TABS: 10.0000 mg | ORAL_TABLET | Freq: Two times a day (BID) | ORAL | Status: DC

## 2015-06-24 NOTE — Telephone Encounter (Signed)
I contacted Staci AcostaHeather [facility] and she stated the Phoric Acid is a med prescribed by the neurologist. Herbert SetaHeather is going to contact the MD to changed the medication.

## 2015-06-24 NOTE — Telephone Encounter (Signed)
[facility]/ called concerning pt/ call back 2 [phone]

## 2015-06-24 NOTE — Telephone Encounter (Signed)
[facility]oke with [name]. Requesting last ov note, they are trying to get patient added on to their service. She has been d/c'd from home health reason stated was they couldn't do anymore for patient. Note faxed.

## 2015-06-24 NOTE — Telephone Encounter (Signed)
See note below and please advise, Thanks! 

## 2015-06-24 NOTE — Telephone Encounter (Signed)
Ok, i did for [place] and janumet i don't know what the 3rd med is.  Please clarify

## 2015-06-24 NOTE — Telephone Encounter (Signed)
[name] with [facility] # [phone] Husband has to crush the pt's meds in order for her to take them if he doesn't she refuses to take them.  Can the phoric acid, janumet, and the namenda be changed to a crushable form for her to take them.

## 2015-06-24 NOTE — Addendum Note (Signed)
Addended by: Romero BellingELLISON, Jayani Rozman on: 06/24/2015 12:46 PM   Modules accepted: Orders, Medications

## 2015-06-25 ENCOUNTER — Other Ambulatory Visit: Payer: Self-pay | Admitting: Endocrinology

## 2015-06-28 ENCOUNTER — Other Ambulatory Visit: Payer: Self-pay

## 2015-06-28 ENCOUNTER — Ambulatory Visit: Payer: Self-pay | Admitting: Endocrinology

## 2015-06-28 ENCOUNTER — Telehealth: Payer: Self-pay | Admitting: Neurology

## 2015-06-28 MED ORDER — VALPROIC ACID 250 MG/5ML PO SOLN: ORAL | Status: AC

## 2015-06-28 NOTE — Telephone Encounter (Signed)
I called and spoke with patient's husband and notified him of [facility] received [facility]. Told him we will send in rx for patient for liquid depakote.

## 2015-06-28 NOTE — Telephone Encounter (Signed)
[name] in [facility] to PT's medication Divalproex, needs crushable form, PT not swallowing it/Dawn CB# [phone]

## 2015-06-28 NOTE — Patient Outreach (Signed)
[facility]) Care Management  06/28/2015  Dana [name] 07/11/1946 [id number]  Assessment: RNCM called for Transition of care. RNCM spoke with member's husband, primary caregiver for transition of care call post admission for hypoglycemia. [name] reports member is about the same except she has been having trouble swallowing larger pills and that primary care has changed her antisezeure medication to liquid. He states members blood sugars have been doing good. Her blood sugar was around 138 this morning. [name] reports neither palliative care or home health is involved at this time. [name] reports that his daughter has applied for Medicaid and they are waiting on a determination. Transition of care Program completed. RNCM discussed health coach program and [name] is agreeable to transitioning to health coach.   Plan: home visit to assess for any additional care coordination needs then transition to health coach.  Dana SheriffJuana Quanta Roher, RN, MSN, [facility],CCM [facility] Community Care Coordinator Cell: [phone]

## 2015-06-28 NOTE — Telephone Encounter (Signed)
Pls let husband know there is a liquid formulation, let's switch her to Depakote 250mg /755ml, take 10mL daily. Pls send Rx for 30 days with 11 refills. Thanks

## 2015-06-28 NOTE — Telephone Encounter (Signed)
I spoke with Dana [name] they are trying to help patient's husband get some of her medications changed due to her refusing to take them. They did d/c her from home health services but patient is still on their OT service.

## 2015-07-01 ENCOUNTER — Ambulatory Visit (INDEPENDENT_AMBULATORY_CARE_PROVIDER_SITE_OTHER): Payer: Commercial Managed Care - HMO | Admitting: Ophthalmology

## 2015-07-01 DIAGNOSIS — G40909 Epilepsy, unspecified, not intractable, without status epilepticus: Secondary | ICD-10-CM | POA: Diagnosis not present

## 2015-07-01 DIAGNOSIS — M6281 Muscle weakness (generalized): Secondary | ICD-10-CM | POA: Diagnosis not present

## 2015-07-01 DIAGNOSIS — I110 Hypertensive heart disease with heart failure: Secondary | ICD-10-CM | POA: Diagnosis not present

## 2015-07-01 DIAGNOSIS — F0390 Unspecified dementia without behavioral disturbance: Secondary | ICD-10-CM | POA: Diagnosis not present

## 2015-07-01 DIAGNOSIS — E11649 Type 2 diabetes mellitus with hypoglycemia without coma: Secondary | ICD-10-CM | POA: Diagnosis not present

## 2015-07-08 ENCOUNTER — Ambulatory Visit: Payer: Self-pay | Admitting: Endocrinology

## 2015-07-11 ENCOUNTER — Telehealth: Payer: Self-pay | Admitting: Endocrinology

## 2015-07-11 DIAGNOSIS — I10 Essential (primary) hypertension: Secondary | ICD-10-CM | POA: Diagnosis not present

## 2015-07-11 DIAGNOSIS — G309 Alzheimer's disease, unspecified: Secondary | ICD-10-CM | POA: Diagnosis not present

## 2015-07-11 DIAGNOSIS — I6789 Other cerebrovascular disease: Secondary | ICD-10-CM | POA: Diagnosis not present

## 2015-07-11 DIAGNOSIS — I5042 Chronic combined systolic (congestive) and diastolic (congestive) heart failure: Secondary | ICD-10-CM | POA: Diagnosis not present

## 2015-07-11 NOTE — Telephone Encounter (Signed)
Patient no showed today's appt. Please advise on how to follow up. °A. No follow up necessary. °B. Follow up urgent. Contact patient immediately. °C. Follow up necessary. Contact patient and schedule visit in ___ days. °D. Follow up advised. Contact patient and schedule visit in ____weeks. ° °

## 2015-07-11 NOTE — Telephone Encounter (Signed)
Follow up advised. Contact patient and schedule visit in 4 weeks 

## 2015-07-11 NOTE — Telephone Encounter (Signed)
No show letter mailed to the pt.  

## 2015-07-12 ENCOUNTER — Other Ambulatory Visit: Payer: Self-pay | Admitting: Cardiovascular Disease

## 2015-07-26 ENCOUNTER — Other Ambulatory Visit: Payer: Self-pay | Admitting: Endocrinology

## 2015-07-26 ENCOUNTER — Ambulatory Visit: Payer: Self-pay

## 2015-07-30 ENCOUNTER — Encounter: Payer: Self-pay | Admitting: Endocrinology

## 2015-07-30 ENCOUNTER — Ambulatory Visit (INDEPENDENT_AMBULATORY_CARE_PROVIDER_SITE_OTHER): Payer: Commercial Managed Care - HMO | Admitting: Endocrinology

## 2015-07-30 VITALS — BP 118/64 | HR 90

## 2015-07-30 DIAGNOSIS — I5021 Acute systolic (congestive) heart failure: Secondary | ICD-10-CM | POA: Diagnosis not present

## 2015-07-30 DIAGNOSIS — Z23 Encounter for immunization: Secondary | ICD-10-CM

## 2015-07-30 DIAGNOSIS — E109 Type 1 diabetes mellitus without complications: Secondary | ICD-10-CM

## 2015-07-30 LAB — POCT GLYCOSYLATED HEMOGLOBIN (HGB A1C): Hemoglobin A1C: 6.7

## 2015-07-30 MED ORDER — GLIMEPIRIDE 1 MG PO TABS: 0.5000 mg | ORAL_TABLET | Freq: Every day | ORAL | Status: DC

## 2015-07-30 NOTE — Patient Instructions (Addendum)
Please reduce the glimepiride to 1/2 pill each morning.  i have sent a prescription to your pharmacy.   Please come back for a regular physical appointment in 1 month.   check your blood sugar once a day.  vary the time of day when you check, between before the 3 meals, and at bedtime.  also check if you have symptoms of your blood sugar being too high or too low.  please keep a record of the readings and bring it to your next appointment here (or you can bring the meter itself).  You can write it on any piece of paper.  please call us sooner if your blood sugar goes below 70, or if you have a lot of readings over 200.  Please go back to see the heart doctor.  Please see a specialist.  you will receive a phone call, about a day and time for an appointment.  Please continue the same medications for memory.

## 2015-07-30 NOTE — Progress Notes (Signed)
Subjective:    Patient ID: Dana [name], female    DOB: 08/07/1945, 70 y.o.   MRN: [id number]  HPI The state of at least three ongoing medical problems is addressed today, with interval history of each noted here: Pt returns for f/u of diabetes mellitus: DM type: 2 Dx'ed: 1987 Complications: none Therapy: 3 oral meds GDM: never DKA: never Severe hypoglycemia: several times in 2016.   Pancreatitis: never Other: therapy has been limited by noncompliance and dementia; husband checks cbg's; she was admitted in march of 2014, with severe hyperglycemia. She took insulin 1990-2016 Interval history: husband brings cbg's.  It varies from 90-130.  There is no trend throughout the day.  He denies pt has hypoglycemia CHF: denies sob.   Dementia: husb says unchanged.   Past Medical History  Diagnosis Date  . ALLERGIC RHINITIS 02/08/2007  . ANXIETY 02/08/2007  . DEPRESSION 02/08/2007  . DIABETES MELLITUS, TYPE I 02/08/2007  . Headache(784.0) 11/06/2008  . HYPERCHOLESTEROLEMIA 11/05/2008  . HYPERTENSION 02/08/2007  . URINARY INCONTINENCE 02/08/2007  . Dyslipidemia   . Non-ulcer dyspepsia   . Leukopenia   . DM retinopathy (HCC)     background  . 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 Dementia   . CHF (congestive heart failure) (HCC)   . Anemia     takes iron  . Skin abnormality     both knees with opne ares and left shoulder with open areas, changes dressings prn    Past Surgical History  Procedure Laterality Date  . Total knee arthroplasty  1990    left  . Panendoscopy  10/27/1999  . Stress cardiolite  12/14/2003  . Electrocardiogram  05/17/2006  . Abdominal hysterectomy    . Bladder tach  years ago  . Colonoscopy with propofol N/A 06/21/2014    Procedure: COLONOSCOPY WITH PROPOFOL;  Surgeon: Rachael Feeaniel P Jacobs, MD;  Location: [facility];  Service: Endoscopy;  Laterality: N/A;    Social History   Social History  . Marital Status: Married    Spouse Name: N/A  . Number  of Children: 2  . Years of Education: N/A   Occupational History  . Retired    Social History Main Topics  . Smoking status: Never Smoker   . Smokeless tobacco: Never Used  . Alcohol Use: No  . Drug Use: No  . Sexual Activity: No   Other Topics Concern  . Not on file   Social History Narrative    Current Outpatient Prescriptions on File Prior to Visit  Medication Sig Dispense Refill  . aspirin EC 325 MG tablet TAKE 1 TABLET (325 MG TOTAL) BY MOUTH DAILY. 30 tablet 0  . atorvastatin (LIPITOR) 20 MG tablet TAKE 1 TABLET BY MOUTH EVERY DAY 90 tablet 0  . carvedilol (COREG) 3.125 MG tablet Take 1 tablet (3.125 mg total) by mouth 2 (two) times daily with a meal. (Patient taking differently: Take 3.125 mg by mouth daily. ) 60 tablet 6  . citalopram (CELEXA) 20 MG tablet TAKE 1 TABLET (20 MG TOTAL) BY MOUTH DAILY. 30 tablet 3  . divalproex (DEPAKOTE ER) 250 MG 24 hr tablet Take 2 tablets (500 mg total) by mouth every morning. 60 tablet 11  . donepezil (ARICEPT) 10 MG tablet Take 10 mg by mouth at bedtime.    . furosemide (LASIX) 40 MG tablet Take 40 mg by mouth daily.     . hyoscyamine (LEVSIN SL) 0.125 MG SL tablet PLACE 1 TABLET  UNDER THE TONGUE TWICE DAILY AS NEEDED FOR CRAMPS AND SPASMS. 60 tablet 0  . lisinopril (PRINIVIL,ZESTRIL) 2.5 MG tablet TAKE 1 TABLET (2.5 MG TOTAL) BY MOUTH EVERY MORNING. 30 tablet 1  . loperamide (IMODIUM) 2 MG capsule Take 1 capsule (2 mg total) by mouth as needed for diarrhea or loose stools. 30 capsule 0  . memantine (NAMENDA) 10 MG tablet Take 1 tablet (10 mg total) by mouth 2 (two) times daily. 60 tablet 11  . Propylene Glycol (SYSTANE BALANCE OP) Apply 1 drop to eye 3 (three) times daily as needed (dry eyes).    . sitaGLIPtin-metformin (JANUMET) 50-1000 MG tablet Take 1 tablet by mouth 2 (two) times daily with a meal. 60 tablet 11  . sodium chloride (OCEAN) 0.65 % SOLN nasal spray Place 2 sprays into both nostrils as needed for congestion.    .  Valproate Sodium (VALPROIC ACID) 250 MG/5ML SOLN Take 10 mls daily 300 mL 11   No current facility-administered medications on file prior to visit.    Allergies  Allergen Reactions  . Aspirin Nausea Only  . Codeine Nausea Only    Family History  Problem Relation Age of Onset  . Alcohol abuse Son   . Cancer Neg Hx     no cancer in immediate family  . CAD Father   . Colon cancer Neg Hx   . Colon polyps Neg Hx   . Diabetes Father   . Kidney disease Neg Hx   . Esophageal cancer Neg Hx   . Gallbladder disease Neg Hx     BP 118/64 mmHg  Pulse 90  Wt   SpO2 94%  Review of Systems Denies falls and weight change.    Objective:   Physical Exam VITAL SIGNS:  See VS page.   GENERAL: no distress.  In wheelchair.   PSYCH: Alert and cooperative.  Says it is 2001.  Oriented to self only.  Does not appear anxious nor depressed.    A1c=6.7%    Assessment & Plan:  DM: overcontrolled,  given this sulfonylurea-containing regimen. Dementia: stable.  CHF: stable, but she needs cardiol f/u.   Patient is advised the following: Patient Instructions  Please reduce the glimepiride to 1/2 pill each morning.  i have sent a prescription to your pharmacy.   Please come back for a regular physical appointment in 1 month.   check your blood sugar once a day.  vary the time of day when you check, between before the 3 meals, and at bedtime.  also check if you have symptoms of your blood sugar being too high or too low.  please keep a record of the readings and bring it to your next appointment here (or you can bring the meter itself).  You can write it on any piece of paper.  please call us sooner if your blood sugar goes below 70, or if you have a lot of readings over 200.  Please go back to see the heart doctor.  Please see a specialist.  you will receive a phone call, about a day and time for an appointment.  Please continue the same medications for memory.

## 2015-08-02 ENCOUNTER — Other Ambulatory Visit: Payer: Self-pay

## 2015-08-02 VITALS — BP 142/88 | HR 73 | Resp 20

## 2015-08-02 DIAGNOSIS — E0821 Diabetes mellitus due to underlying condition with diabetic nephropathy: Secondary | ICD-10-CM

## 2015-08-02 DIAGNOSIS — Z794 Long term (current) use of insulin: Principal | ICD-10-CM

## 2015-08-02 NOTE — Patient Outreach (Signed)
[facility] Care Management  [facility]  08/02/2015   Dana [name] 04/09/1946 [id number]  Subjective: Member denies any problems or concerns, "I am doing good".  Objective: BP 142/88 mmHg  Pulse 73  Resp 20  SpO2 99%, skin warm, dry, respirations even/unlabored, heart rate regular.  Current Medications:  Current Outpatient Prescriptions  Medication Sig Dispense Refill  . aspirin EC 325 MG tablet TAKE 1 TABLET (325 MG TOTAL) BY MOUTH DAILY. 30 tablet 0  . atorvastatin (LIPITOR) 20 MG tablet TAKE 1 TABLET BY MOUTH EVERY DAY 90 tablet 0  . carvedilol (COREG) 3.125 MG tablet Take 1 tablet (3.125 mg total) by mouth 2 (two) times daily with a meal. (Patient taking differently: Take 3.125 mg by mouth daily. ) 60 tablet 6  . citalopram (CELEXA) 20 MG tablet TAKE 1 TABLET (20 MG TOTAL) BY MOUTH DAILY. 30 tablet 3  . donepezil (ARICEPT) 10 MG tablet Take 10 mg by mouth at bedtime.    . furosemide (LASIX) 40 MG tablet Take 40 mg by mouth daily.     Marland Kitchen. glimepiride (AMARYL) 1 MG tablet Take 0.5 tablets (0.5 mg total) by mouth daily with breakfast. 15 tablet 11  . hyoscyamine (LEVSIN SL) 0.125 MG SL tablet PLACE 1 TABLET UNDER THE TONGUE TWICE DAILY AS NEEDED FOR CRAMPS AND SPASMS. 60 tablet 0  . lisinopril (PRINIVIL,ZESTRIL) 2.5 MG tablet TAKE 1 TABLET (2.5 MG TOTAL) BY MOUTH EVERY MORNING. 30 tablet 1  . loperamide (IMODIUM) 2 MG capsule Take 1 capsule (2 mg total) by mouth as needed for diarrhea or loose stools. 30 capsule 0  . memantine (NAMENDA) 10 MG tablet Take 1 tablet (10 mg total) by mouth 2 (two) times daily. 60 tablet 11  . Propylene Glycol (SYSTANE BALANCE OP) Apply 1 drop to eye 3 (three) times daily as needed (dry eyes).    . sitaGLIPtin-metformin (JANUMET) 50-1000 MG tablet Take 1 tablet by mouth 2 (two) times daily with a meal. 60 tablet 11  . sodium chloride (OCEAN) 0.65 % SOLN nasal spray Place 2 sprays into both nostrils as needed for congestion.    .  Valproate Sodium (VALPROIC ACID) 250 MG/5ML SOLN Take 10 mls daily 300 mL 11  . divalproex (DEPAKOTE ER) 250 MG 24 hr tablet Take 2 tablets (500 mg total) by mouth every morning. (Patient not taking: Reported on 08/02/2015) 60 tablet 11   No current facility-administered medications for this visit.    Assessment: 70 year old with history of diabetes, urinary tract infection, dementia. Primary caregiver is her Husband. Member/caregiver without questions/concerns. Mr. [name] reports member has been discharged from palliative care. Last seen by primary care this past Tuesday. Blood sugars improved. Low within the past 30 days was 86; high was 258. A1C 6.7. RNCM reviewed EMMI article: diabetes when you are sick; Lower blood sugar self care. No care management needs identified at this time. Mr. [name] would like to transition to health coach for reinforcement diabetes education-"I can always use more education on diabetes.   Plan: Transition to Health coach.  Kathyrn SheriffJuana Arlyn Buerkle, RN, MSN, Adventhealth SebringBSN,CCM [facility] Community Care Coordinator Cell: [phone]

## 2015-08-05 ENCOUNTER — Other Ambulatory Visit: Payer: Self-pay

## 2015-08-05 MED ORDER — SITAGLIPTIN PHOS-METFORMIN HCL 50-1000 MG PO TABS: 1.0000 | ORAL_TABLET | Freq: Two times a day (BID) | ORAL | Status: DC

## 2015-08-07 ENCOUNTER — Other Ambulatory Visit: Payer: Self-pay | Admitting: Cardiovascular Disease

## 2015-08-07 MED ORDER — LISINOPRIL 2.5 MG PO TABS: ORAL_TABLET | ORAL | Status: DC

## 2015-08-11 DIAGNOSIS — G309 Alzheimer's disease, unspecified: Secondary | ICD-10-CM | POA: Diagnosis not present

## 2015-08-11 DIAGNOSIS — I5042 Chronic combined systolic (congestive) and diastolic (congestive) heart failure: Secondary | ICD-10-CM | POA: Diagnosis not present

## 2015-08-11 DIAGNOSIS — I10 Essential (primary) hypertension: Secondary | ICD-10-CM | POA: Diagnosis not present

## 2015-08-11 DIAGNOSIS — I6789 Other cerebrovascular disease: Secondary | ICD-10-CM | POA: Diagnosis not present

## 2015-08-13 NOTE — Progress Notes (Signed)
Cardiology Office Note:    Date:  08/13/2015   ID:  Dana [name], DOB 17-Jan-1946, MRN [id number]  PCP:  Romero Belling, MD  Cardiologist:  Dr. Verne Carrow   Electrophysiologist:  n/a  Chief Complaint  Patient presents with  . Congestive Heart Failure    Follow-up    History of Present Illness:    Dana [name] is a 70 y.o. female with a hx of     Past Medical History  Diagnosis Date  . ALLERGIC RHINITIS 02/08/2007  . ANXIETY 02/08/2007  . DEPRESSION 02/08/2007  . DIABETES MELLITUS, TYPE I 02/08/2007  . Headache(784.0) 11/06/2008  . HYPERCHOLESTEROLEMIA 11/05/2008  . HYPERTENSION 02/08/2007  . URINARY INCONTINENCE 02/08/2007  . Dyslipidemia   . Non-ulcer dyspepsia   . Leukopenia   . DM retinopathy (HCC)     background  . 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 Dementia   . CHF (congestive heart failure) (HCC)   . Anemia     takes iron  . Skin abnormality     both knees with opne ares and left shoulder with open areas, changes dressings prn    Past Surgical History  Procedure Laterality Date  . Total knee arthroplasty  1990    left  . Panendoscopy  10/27/1999  . Stress cardiolite  12/14/2003  . Electrocardiogram  05/17/2006  . Abdominal hysterectomy    . Bladder tach  years ago  . Colonoscopy with propofol N/A 06/21/2014    Procedure: COLONOSCOPY WITH PROPOFOL;  Surgeon: Rachael Fee, MD;  Location: [facility];  Service: Endoscopy;  Laterality: N/A;    Current Medications: Outpatient Prescriptions Prior to Visit  Medication Sig Dispense Refill  . aspirin EC 325 MG tablet TAKE 1 TABLET (325 MG TOTAL) BY MOUTH DAILY. 30 tablet 0  . atorvastatin (LIPITOR) 20 MG tablet TAKE 1 TABLET BY MOUTH EVERY DAY 90 tablet 0  . carvedilol (COREG) 3.125 MG tablet Take 1 tablet (3.125 mg total) by mouth 2 (two) times daily with a meal. (Patient taking differently: Take 3.125 mg by mouth daily. ) 60 tablet 6  . citalopram (CELEXA) 20 MG tablet TAKE 1 TABLET  (20 MG TOTAL) BY MOUTH DAILY. 30 tablet 3  . divalproex (DEPAKOTE ER) 250 MG 24 hr tablet Take 2 tablets (500 mg total) by mouth every morning. (Patient not taking: Reported on 08/02/2015) 60 tablet 11  . donepezil (ARICEPT) 10 MG tablet Take 10 mg by mouth at bedtime.    . furosemide (LASIX) 40 MG tablet Take 40 mg by mouth daily.     Marland Kitchen glimepiride (AMARYL) 1 MG tablet Take 0.5 tablets (0.5 mg total) by mouth daily with breakfast. 15 tablet 11  . hyoscyamine (LEVSIN SL) 0.125 MG SL tablet PLACE 1 TABLET UNDER THE TONGUE TWICE DAILY AS NEEDED FOR CRAMPS AND SPASMS. 60 tablet 0  . lisinopril (PRINIVIL,ZESTRIL) 2.5 MG tablet TAKE 1 TABLET (2.5 MG TOTAL) BY MOUTH EVERY MORNING. 30 tablet 0  . loperamide (IMODIUM) 2 MG capsule Take 1 capsule (2 mg total) by mouth as needed for diarrhea or loose stools. 30 capsule 0  . memantine (NAMENDA) 10 MG tablet Take 1 tablet (10 mg total) by mouth 2 (two) times daily. 60 tablet 11  . Propylene Glycol (SYSTANE BALANCE OP) Apply 1 drop to eye 3 (three) times daily as needed (dry eyes).    . sitaGLIPtin-metformin (JANUMET) 50-1000 MG tablet Take 1 tablet by mouth 2 (two) times daily  with a meal. 180 tablet 1  . sodium chloride (OCEAN) 0.65 % SOLN nasal spray Place 2 sprays into both nostrils as needed for congestion.    . Valproate Sodium (VALPROIC ACID) 250 MG/5ML SOLN Take 10 mls daily 300 mL 11   No facility-administered medications prior to visit.     Allergies:   Aspirin and Codeine   Social History   Social History  . Marital Status: Married    Spouse Name: N/A  . Number of Children: 2  . Years of Education: N/A   Occupational History  . Retired    Social History Main Topics  . Smoking status: Never Smoker   . Smokeless tobacco: Never Used  . Alcohol Use: No  . Drug Use: No  . Sexual Activity: No   Other Topics Concern  . Not on file   Social History Narrative     Family History:  The patient's family history includes Alcohol abuse in  her son; CAD in her father; Diabetes in her father. There is no history of Cancer, Colon cancer, Colon polyps, Kidney disease, Esophageal cancer, or Gallbladder disease.   ROS:   Please see the history of present illness.    ROS All other systems reviewed and are negative.   Physical Exam:    VS:  There were no vitals taken for this visit.   GEN: Well nourished, well developed, in no acute distress HEENT: normal Neck: no JVD, no masses Cardiac: Normal S1/S2, RRR; no murmurs, rubs, or gallops, no edema;   carotid bruits,   Respiratory:  clear to auscultation bilaterally; no wheezing, rhonchi or rales GI: soft, nontender, nondistended, + BS MS: no deformity or atrophy Skin: warm and dry, no rash Neuro:  Bilateral strength equal, no focal deficits  Psych: Alert and oriented x 3, normal affect  Wt Readings from Last 3 Encounters:  05/29/15 156 lb (70.761 kg)  05/24/15 163 lb 1.6 oz (73.982 kg)  04/11/15 178 lb 2.1 oz (80.8 kg)      Studies/Labs Reviewed:    EKG:  EKG is  ordered today.  The ekg ordered today demonstrates   Recent Labs: 11/22/2014: Magnesium 1.9 02/27/2015: TSH 1.336 04/09/2015: B Natriuretic Peptide 138.1* 05/22/2015: ALT 16 05/23/2015: Hemoglobin 9.9*; Platelets 224 05/24/2015: BUN 14; Creatinine, Ser 0.73; Potassium 3.6; Sodium 138   Recent Lipid Panel    Component Value Date/Time   CHOL 170 08/27/2014 1615   TRIG 103.0 08/27/2014 1615   TRIG 42 05/11/2006 1036   HDL 77.00 08/27/2014 1615   CHOLHDL 2 08/27/2014 1615   CHOLHDL 2.9 CALC 05/11/2006 1036   VLDL 20.6 08/27/2014 1615   LDLCALC 72 08/27/2014 1615   LDLDIRECT 141.5 07/05/2013 1344   LDLDIRECT 126.0 05/11/2006 1036    Additional studies/ records that were reviewed today include:    Echo 02/27/15 - Left ventricle: The cavity size was normal. There was moderate concentric hypertrophy. Systolic function was mildly to moderately reduced. The estimated ejection fraction was in the range  of 40% to 45%. Wall motion was normal; there were no regional wall motion abnormalities. Doppler parameters are consistent with abnormal left ventricular relaxation (grade 1 diastolic dysfunction). - Ventricular septum: Septal motion showed abnormal function and dyssynergy.  Impressions:  - There is substantial improvemen in LV function since August 2014.   Myoview 03/14/13 Impression:  Intermediate risk myovue with moderate anteroapical wall MI and moderate peri infarct ischemia. EF severely reduced at 33% and looks worse on surfact images   ASSESSMENT:  No diagnosis found.  PLAN:    In order of problems listed above:  1.     Medication Adjustments/Labs and Tests Ordered: Current medicines are reviewed at length with the patient today.  Concerns regarding medicines are outlined above.  Medication changes, Labs and Tests ordered today are outlined in the Patient Instructions noted below. There are no Patient Instructions on file for this visit.   Signed, Tereso Newcomer, PA-C  08/13/2015 6:05 PM    [facility] [address] [place], [place], [place]  [address] Phone: [phone]; Fax: [phone]     This encounter was created in error - please disregard.

## 2015-08-14 ENCOUNTER — Encounter: Payer: Commercial Managed Care - HMO | Admitting: Physician Assistant

## 2015-08-14 ENCOUNTER — Telehealth: Payer: Self-pay | Admitting: Endocrinology

## 2015-08-14 DIAGNOSIS — F0280 Dementia in other diseases classified elsewhere without behavioral disturbance: Secondary | ICD-10-CM

## 2015-08-14 DIAGNOSIS — G301 Alzheimer's disease with late onset: Principal | ICD-10-CM

## 2015-08-14 NOTE — Telephone Encounter (Signed)
Pt's husband is ok with [facility] coming to the pt's home.

## 2015-08-14 NOTE — Telephone Encounter (Signed)
Patient husband stated that patient is having mini strokes and has bed sores, they need a nurse out come to help her, please advise

## 2015-08-14 NOTE — Telephone Encounter (Signed)
"  [facility]" was there before. Are they ok?

## 2015-08-14 NOTE — Telephone Encounter (Signed)
done

## 2015-08-14 NOTE — Telephone Encounter (Signed)
See note below and please advise if we can refer to home health.

## 2015-08-19 DIAGNOSIS — F0280 Dementia in other diseases classified elsewhere without behavioral disturbance: Secondary | ICD-10-CM | POA: Diagnosis not present

## 2015-08-19 DIAGNOSIS — I110 Hypertensive heart disease with heart failure: Secondary | ICD-10-CM | POA: Diagnosis not present

## 2015-08-19 DIAGNOSIS — E1121 Type 2 diabetes mellitus with diabetic nephropathy: Secondary | ICD-10-CM | POA: Diagnosis not present

## 2015-08-19 DIAGNOSIS — I509 Heart failure, unspecified: Secondary | ICD-10-CM | POA: Diagnosis not present

## 2015-08-19 DIAGNOSIS — G301 Alzheimer's disease with late onset: Secondary | ICD-10-CM | POA: Diagnosis not present

## 2015-08-21 ENCOUNTER — Encounter: Payer: Self-pay | Admitting: Neurology

## 2015-08-21 NOTE — Progress Notes (Signed)
Cardiology Office Note:    Date:  08/21/2015   ID:  Dana [name], DOB Oct 26, 1945, MRN [id number]  PCP:  Romero Belling, MD  Cardiologist:  Dr. Verne Carrow   Electrophysiologist:  n/a  No chief complaint on file.    History of Present Illness:     Dana [name] is a 70 y.o. female with a hx of     Past Medical History  Diagnosis Date  . ALLERGIC RHINITIS 02/08/2007  . ANXIETY 02/08/2007  . DEPRESSION 02/08/2007  . DIABETES MELLITUS, TYPE I 02/08/2007  . Headache(784.0) 11/06/2008  . HYPERCHOLESTEROLEMIA 11/05/2008  . HYPERTENSION 02/08/2007  . URINARY INCONTINENCE 02/08/2007  . Dyslipidemia   . Non-ulcer dyspepsia   . Leukopenia   . DM retinopathy (HCC)     background  . 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 Dementia   . CHF (congestive heart failure) (HCC)   . Anemia     takes iron  . Skin abnormality     both knees with opne ares and left shoulder with open areas, changes dressings prn    Past Surgical History  Procedure Laterality Date  . Total knee arthroplasty  1990    left  . Panendoscopy  10/27/1999  . Stress cardiolite  12/14/2003  . Electrocardiogram  05/17/2006  . Abdominal hysterectomy    . Bladder tach  years ago  . Colonoscopy with propofol N/A 06/21/2014    Procedure: COLONOSCOPY WITH PROPOFOL;  Surgeon: Rachael Fee, MD;  Location: [facility];  Service: Endoscopy;  Laterality: N/A;    Current Medications: Outpatient Prescriptions Prior to Visit  Medication Sig Dispense Refill  . aspirin EC 325 MG tablet TAKE 1 TABLET (325 MG TOTAL) BY MOUTH DAILY. 30 tablet 0  . atorvastatin (LIPITOR) 20 MG tablet TAKE 1 TABLET BY MOUTH EVERY DAY 90 tablet 0  . carvedilol (COREG) 3.125 MG tablet Take 1 tablet (3.125 mg total) by mouth 2 (two) times daily with a meal. (Patient taking differently: Take 3.125 mg by mouth daily. ) 60 tablet 6  . citalopram (CELEXA) 20 MG tablet TAKE 1 TABLET (20 MG TOTAL) BY MOUTH DAILY. 30 tablet 3  .  divalproex (DEPAKOTE ER) 250 MG 24 hr tablet Take 2 tablets (500 mg total) by mouth every morning. (Patient not taking: Reported on 08/02/2015) 60 tablet 11  . donepezil (ARICEPT) 10 MG tablet Take 10 mg by mouth at bedtime.    . furosemide (LASIX) 40 MG tablet Take 40 mg by mouth daily.     Marland Kitchen glimepiride (AMARYL) 1 MG tablet Take 0.5 tablets (0.5 mg total) by mouth daily with breakfast. 15 tablet 11  . hyoscyamine (LEVSIN SL) 0.125 MG SL tablet PLACE 1 TABLET UNDER THE TONGUE TWICE DAILY AS NEEDED FOR CRAMPS AND SPASMS. 60 tablet 0  . lisinopril (PRINIVIL,ZESTRIL) 2.5 MG tablet TAKE 1 TABLET (2.5 MG TOTAL) BY MOUTH EVERY MORNING. 30 tablet 0  . loperamide (IMODIUM) 2 MG capsule Take 1 capsule (2 mg total) by mouth as needed for diarrhea or loose stools. 30 capsule 0  . memantine (NAMENDA) 10 MG tablet Take 1 tablet (10 mg total) by mouth 2 (two) times daily. 60 tablet 11  . Propylene Glycol (SYSTANE BALANCE OP) Apply 1 drop to eye 3 (three) times daily as needed (dry eyes).    . sitaGLIPtin-metformin (JANUMET) 50-1000 MG tablet Take 1 tablet by mouth 2 (two) times daily with a meal. 180 tablet 1  . sodium  chloride (OCEAN) 0.65 % SOLN nasal spray Place 2 sprays into both nostrils as needed for congestion.    . Valproate Sodium (VALPROIC ACID) 250 MG/5ML SOLN Take 10 mls daily 300 mL 11   No facility-administered medications prior to visit.     Allergies:   Aspirin and Codeine   Social History   Social History  . Marital Status: Married    Spouse Name: N/A  . Number of Children: 2  . Years of Education: N/A   Occupational History  . Retired    Social History Main Topics  . Smoking status: Never Smoker   . Smokeless tobacco: Never Used  . Alcohol Use: No  . Drug Use: No  . Sexual Activity: No   Other Topics Concern  . Not on file   Social History Narrative     Family History:  The patient's family history includes Alcohol abuse in her son; CAD in her father; Diabetes in her  father. There is no history of Cancer, Colon cancer, Colon polyps, Kidney disease, Esophageal cancer, or Gallbladder disease.   ROS:   Please see the history of present illness.    ROS All other systems reviewed and are negative.   Physical Exam:    VS:  There were no vitals taken for this visit.   GEN: Well nourished, well developed, in no acute distress HEENT: normal Neck: no JVD, no masses Cardiac: Normal S1/S2, RRR; no murmurs, rubs, or gallops, no edema;   carotid bruits,   Respiratory:  clear to auscultation bilaterally; no wheezing, rhonchi or rales GI: soft, nontender, nondistended, + BS MS: no deformity or atrophy Skin: warm and dry, no rash Neuro:  Bilateral strength equal, no focal deficits  Psych: Alert and oriented x 3, normal affect  Wt Readings from Last 3 Encounters:  05/29/15 156 lb (70.761 kg)  05/24/15 163 lb 1.6 oz (73.982 kg)  04/11/15 178 lb 2.1 oz (80.8 kg)      Studies/Labs Reviewed:     EKG:  EKG is  ordered today.  The ekg ordered today demonstrates   Recent Labs: 11/22/2014: Magnesium 1.9 02/27/2015: TSH 1.336 04/09/2015: B Natriuretic Peptide 138.1* 05/22/2015: ALT 16 05/23/2015: Hemoglobin 9.9*; Platelets 224 05/24/2015: BUN 14; Creatinine, Ser 0.73; Potassium 3.6; Sodium 138   Recent Lipid Panel    Component Value Date/Time   CHOL 170 08/27/2014 1615   TRIG 103.0 08/27/2014 1615   TRIG 42 05/11/2006 1036   HDL 77.00 08/27/2014 1615   CHOLHDL 2 08/27/2014 1615   CHOLHDL 2.9 CALC 05/11/2006 1036   VLDL 20.6 08/27/2014 1615   LDLCALC 72 08/27/2014 1615   LDLDIRECT 141.5 07/05/2013 1344   LDLDIRECT 126.0 05/11/2006 1036    Additional studies/ records that were reviewed today include:    Echo 02/27/15 - Left ventricle: The cavity size was normal. There was moderate concentric hypertrophy. Systolic function was mildly to moderately reduced. The estimated ejection fraction was in the range of 40% to 45%. Wall motion was normal; there  were no regional wall motion abnormalities. Doppler parameters are consistent with abnormal left ventricular relaxation (grade 1 diastolic dysfunction). - Ventricular septum: Septal motion showed abnormal function and dyssynergy.  Impressions:  - There is substantial improvemen in LV function since August 2014.   Myoview 03/14/13 Impression:  Intermediate risk myovue with moderate anteroapical wall MI and moderate peri infarct ischemia. EF severely reduced at 33% and looks worse on surfact images   ASSESSMENT:     No diagnosis found.  PLAN:     In order of problems listed above:  1.     Medication Adjustments/Labs and Tests Ordered: Current medicines are reviewed at length with the patient today.  Concerns regarding medicines are outlined above.  Medication changes, Labs and Tests ordered today are outlined in the Patient Instructions noted below. There are no Patient Instructions on file for this visit.  Signed, Tereso Newcomer, PA-C  08/21/2015 5:51 PM    [facility] [address] [place], [place], [place]  [address] Phone: [phone]; Fax: [phone]     This encounter was created in error - please disregard.

## 2015-08-22 ENCOUNTER — Encounter: Payer: Commercial Managed Care - HMO | Admitting: Physician Assistant

## 2015-08-22 DIAGNOSIS — E1121 Type 2 diabetes mellitus with diabetic nephropathy: Secondary | ICD-10-CM | POA: Diagnosis not present

## 2015-08-22 DIAGNOSIS — G301 Alzheimer's disease with late onset: Secondary | ICD-10-CM | POA: Diagnosis not present

## 2015-08-22 DIAGNOSIS — I110 Hypertensive heart disease with heart failure: Secondary | ICD-10-CM | POA: Diagnosis not present

## 2015-08-22 DIAGNOSIS — I509 Heart failure, unspecified: Secondary | ICD-10-CM | POA: Diagnosis not present

## 2015-08-22 DIAGNOSIS — F0280 Dementia in other diseases classified elsewhere without behavioral disturbance: Secondary | ICD-10-CM | POA: Diagnosis not present

## 2015-08-23 DIAGNOSIS — F0390 Unspecified dementia without behavioral disturbance: Secondary | ICD-10-CM | POA: Diagnosis not present

## 2015-08-27 DIAGNOSIS — I509 Heart failure, unspecified: Secondary | ICD-10-CM | POA: Diagnosis not present

## 2015-08-27 DIAGNOSIS — F0280 Dementia in other diseases classified elsewhere without behavioral disturbance: Secondary | ICD-10-CM | POA: Diagnosis not present

## 2015-08-27 DIAGNOSIS — E1121 Type 2 diabetes mellitus with diabetic nephropathy: Secondary | ICD-10-CM | POA: Diagnosis not present

## 2015-08-27 DIAGNOSIS — I110 Hypertensive heart disease with heart failure: Secondary | ICD-10-CM | POA: Diagnosis not present

## 2015-08-27 DIAGNOSIS — G301 Alzheimer's disease with late onset: Secondary | ICD-10-CM | POA: Diagnosis not present

## 2015-08-28 DIAGNOSIS — G301 Alzheimer's disease with late onset: Secondary | ICD-10-CM | POA: Diagnosis not present

## 2015-08-28 DIAGNOSIS — E1121 Type 2 diabetes mellitus with diabetic nephropathy: Secondary | ICD-10-CM | POA: Diagnosis not present

## 2015-08-28 DIAGNOSIS — I110 Hypertensive heart disease with heart failure: Secondary | ICD-10-CM | POA: Diagnosis not present

## 2015-08-28 DIAGNOSIS — F0280 Dementia in other diseases classified elsewhere without behavioral disturbance: Secondary | ICD-10-CM | POA: Diagnosis not present

## 2015-08-29 ENCOUNTER — Telehealth: Payer: Self-pay | Admitting: Endocrinology

## 2015-08-29 DIAGNOSIS — I110 Hypertensive heart disease with heart failure: Secondary | ICD-10-CM | POA: Diagnosis not present

## 2015-08-29 DIAGNOSIS — F0280 Dementia in other diseases classified elsewhere without behavioral disturbance: Secondary | ICD-10-CM | POA: Diagnosis not present

## 2015-08-29 DIAGNOSIS — G301 Alzheimer's disease with late onset: Secondary | ICD-10-CM | POA: Diagnosis not present

## 2015-08-29 DIAGNOSIS — E1121 Type 2 diabetes mellitus with diabetic nephropathy: Secondary | ICD-10-CM | POA: Diagnosis not present

## 2015-08-29 DIAGNOSIS — I509 Heart failure, unspecified: Secondary | ICD-10-CM | POA: Diagnosis not present

## 2015-08-29 NOTE — Telephone Encounter (Signed)
Dana [name] with [name] # [phone] calling to let us know that they needs a hauser lift ordered, a medical social worker order

## 2015-08-29 NOTE — Telephone Encounter (Signed)
I printed and faxed the letter. Thanks!

## 2015-08-29 NOTE — Telephone Encounter (Signed)
ok 

## 2015-08-29 NOTE — Telephone Encounter (Signed)
i did letter.  Can you print and send?

## 2015-08-29 NOTE — Telephone Encounter (Signed)
See note below and please advise if we can provide verbal order. Thanks!

## 2015-08-29 NOTE — Telephone Encounter (Addendum)
I contacted [name] with [name] and she states she needs a written order for the hoyer lift.  [phone]. Please advise. Thanks!

## 2015-08-30 ENCOUNTER — Ambulatory Visit (INDEPENDENT_AMBULATORY_CARE_PROVIDER_SITE_OTHER): Payer: Commercial Managed Care - HMO | Admitting: Endocrinology

## 2015-08-30 ENCOUNTER — Encounter: Payer: Self-pay | Admitting: Endocrinology

## 2015-08-30 VITALS — BP 127/64 | HR 89 | Temp 98.6°F

## 2015-08-30 DIAGNOSIS — I110 Hypertensive heart disease with heart failure: Secondary | ICD-10-CM | POA: Diagnosis not present

## 2015-08-30 DIAGNOSIS — E109 Type 1 diabetes mellitus without complications: Secondary | ICD-10-CM

## 2015-08-30 DIAGNOSIS — F0280 Dementia in other diseases classified elsewhere without behavioral disturbance: Secondary | ICD-10-CM | POA: Diagnosis not present

## 2015-08-30 DIAGNOSIS — E1121 Type 2 diabetes mellitus with diabetic nephropathy: Secondary | ICD-10-CM | POA: Diagnosis not present

## 2015-08-30 DIAGNOSIS — I509 Heart failure, unspecified: Secondary | ICD-10-CM | POA: Diagnosis not present

## 2015-08-30 DIAGNOSIS — G301 Alzheimer's disease with late onset: Secondary | ICD-10-CM | POA: Diagnosis not present

## 2015-08-30 NOTE — Progress Notes (Signed)
Subjective:    Patient ID: Dana [name], female    DOB: 1945-12-23, 70 y.o.   MRN: [id number]  HPI The state of at least three ongoing medical problems is addressed today, with interval history of each noted here: Pt returns for f/u of diabetes mellitus: DM type: 2 Dx'ed: 1987 Complications: none Therapy: 3 oral meds GDM: never DKA: never Severe hypoglycemia: several times in 2016.   Pancreatitis: never Other: therapy has been limited by noncompliance and dementia; husband checks cbg's; she was admitted in march of 2014, with severe hyperglycemia. She took insulin 1990-2016.   Interval history: I asked husband, who says cbg varies from 69-130.  There is no trend throughout the day.   Husband says pt has slight liquid-only dysphagia sensation in the neck, but no assoc n.v Past Medical History  Diagnosis Date  . ALLERGIC RHINITIS 02/08/2007  . ANXIETY 02/08/2007  . DEPRESSION 02/08/2007  . DIABETES MELLITUS, TYPE I 02/08/2007  . Headache(784.0) 11/06/2008  . HYPERCHOLESTEROLEMIA 11/05/2008  . HYPERTENSION 02/08/2007  . URINARY INCONTINENCE 02/08/2007  . Dyslipidemia   . Non-ulcer dyspepsia   . Leukopenia   . DM retinopathy (HCC)     background  . 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 Dementia   . CHF (congestive heart failure) (HCC)   . Anemia     takes iron  . Skin abnormality     both knees with opne ares and left shoulder with open areas, changes dressings prn    Past Surgical History  Procedure Laterality Date  . Total knee arthroplasty  1990    left  . Panendoscopy  10/27/1999  . Stress cardiolite  12/14/2003  . Electrocardiogram  05/17/2006  . Abdominal hysterectomy    . Bladder tach  years ago  . Colonoscopy with propofol N/A 06/21/2014    Procedure: COLONOSCOPY WITH PROPOFOL;  Surgeon: Rachael Fee, MD;  Location: [facility];  Service: Endoscopy;  Laterality: N/A;    Social History   Social History  . Marital Status: Married    Spouse Name:  N/A  . Number of Children: 2  . Years of Education: N/A   Occupational History  . Retired    Social History Main Topics  . Smoking status: Never Smoker   . Smokeless tobacco: Never Used  . Alcohol Use: No  . Drug Use: No  . Sexual Activity: No   Other Topics Concern  . Not on file   Social History Narrative    Current Outpatient Prescriptions on File Prior to Visit  Medication Sig Dispense Refill  . aspirin EC 325 MG tablet TAKE 1 TABLET (325 MG TOTAL) BY MOUTH DAILY. 30 tablet 0  . atorvastatin (LIPITOR) 20 MG tablet TAKE 1 TABLET BY MOUTH EVERY DAY 90 tablet 0  . carvedilol (COREG) 3.125 MG tablet Take 1 tablet (3.125 mg total) by mouth 2 (two) times daily with a meal. (Patient taking differently: Take 3.125 mg by mouth daily. ) 60 tablet 6  . citalopram (CELEXA) 20 MG tablet TAKE 1 TABLET (20 MG TOTAL) BY MOUTH DAILY. 30 tablet 3  . divalproex (DEPAKOTE ER) 250 MG 24 hr tablet Take 2 tablets (500 mg total) by mouth every morning. 60 tablet 11  . donepezil (ARICEPT) 10 MG tablet Take 10 mg by mouth at bedtime.    . furosemide (LASIX) 40 MG tablet Take 40 mg by mouth daily.     . hyoscyamine (LEVSIN SL) 0.125 MG SL tablet  PLACE 1 TABLET UNDER THE TONGUE TWICE DAILY AS NEEDED FOR CRAMPS AND SPASMS. 60 tablet 0  . lisinopril (PRINIVIL,ZESTRIL) 2.5 MG tablet TAKE 1 TABLET (2.5 MG TOTAL) BY MOUTH EVERY MORNING. 30 tablet 0  . loperamide (IMODIUM) 2 MG capsule Take 1 capsule (2 mg total) by mouth as needed for diarrhea or loose stools. 30 capsule 0  . Propylene Glycol (SYSTANE BALANCE OP) Apply 1 drop to eye 3 (three) times daily as needed (dry eyes).    . sitaGLIPtin-metformin (JANUMET) 50-1000 MG tablet Take 1 tablet by mouth 2 (two) times daily with a meal. 180 tablet 1  . sodium chloride (OCEAN) 0.65 % SOLN nasal spray Place 2 sprays into both nostrils as needed for congestion.    . Valproate Sodium (VALPROIC ACID) 250 MG/5ML SOLN Take 10 mls daily 300 mL 11  . memantine (NAMENDA)  10 MG tablet Take 1 tablet (10 mg total) by mouth 2 (two) times daily. (Patient not taking: Reported on 08/30/2015) 60 tablet 11   No current facility-administered medications on file prior to visit.    Allergies  Allergen Reactions  . Aspirin Nausea Only  . Codeine Nausea Only    Family History  Problem Relation Age of Onset  . Alcohol abuse Son   . Cancer Neg Hx     no cancer in immediate family  . CAD Father   . Colon cancer Neg Hx   . Colon polyps Neg Hx   . Diabetes Father   . Kidney disease Neg Hx   . Esophageal cancer Neg Hx   . Gallbladder disease Neg Hx     BP 127/64 mmHg  Pulse 89  Temp(Src) 98.6 F (37 C) (Oral)  Ht   Wt   SpO2 97%  Review of Systems Husband says pt's weight continues to fall.      Objective:   Physical Exam VITAL SIGNS:  See vs page (pt was too unsteady to be weighed).   GENERAL: no distress.  LUNGS:  Clear to auscultation      Assessment & Plan:  DM: still overcontrolled.  Dysphagia, new, prob due to dysmotility.  Pt declines swallowing test   Patient is advised the following: Patient Instructions  Please stop taking the glimepiride.   Please come back for a regular physical appointment in 1 month.   Please call back to the heart doctor, and reschedule your appointment.   Please let me know if you decide to do the swallowing test.    check your blood sugar once a day.  vary the time of day when you check, between before the 3 meals, and at bedtime.  also check if you have symptoms of your blood sugar being too high or too low.  please keep a record of the readings and bring it to your next appointment here (or you can bring the meter itself).  You can write it on any piece of paper.  please call us sooner if your blood sugar goes below 70, or if you have a lot of readings over 200.

## 2015-08-30 NOTE — Patient Instructions (Addendum)
Please stop taking the glimepiride.   Please come back for a regular physical appointment in 1 month.   Please call back to the heart doctor, and reschedule your appointment.   Please let me know if you decide to do the swallowing test.    check your blood sugar once a day.  vary the time of day when you check, between before the 3 meals, and at bedtime.  also check if you have symptoms of your blood sugar being too high or too low.  please keep a record of the readings and bring it to your next appointment here (or you can bring the meter itself).  You can write it on any piece of paper.  please call us sooner if your blood sugar goes below 70, or if you have a lot of readings over 200.

## 2015-09-03 DIAGNOSIS — I110 Hypertensive heart disease with heart failure: Secondary | ICD-10-CM | POA: Diagnosis not present

## 2015-09-03 DIAGNOSIS — E1121 Type 2 diabetes mellitus with diabetic nephropathy: Secondary | ICD-10-CM | POA: Diagnosis not present

## 2015-09-03 DIAGNOSIS — G301 Alzheimer's disease with late onset: Secondary | ICD-10-CM | POA: Diagnosis not present

## 2015-09-03 DIAGNOSIS — F0280 Dementia in other diseases classified elsewhere without behavioral disturbance: Secondary | ICD-10-CM | POA: Diagnosis not present

## 2015-09-03 DIAGNOSIS — I509 Heart failure, unspecified: Secondary | ICD-10-CM | POA: Diagnosis not present

## 2015-09-04 ENCOUNTER — Encounter: Payer: Self-pay | Admitting: Neurology

## 2015-09-04 DIAGNOSIS — I5042 Chronic combined systolic (congestive) and diastolic (congestive) heart failure: Secondary | ICD-10-CM | POA: Diagnosis not present

## 2015-09-04 DIAGNOSIS — G309 Alzheimer's disease, unspecified: Secondary | ICD-10-CM | POA: Diagnosis not present

## 2015-09-04 DIAGNOSIS — E1121 Type 2 diabetes mellitus with diabetic nephropathy: Secondary | ICD-10-CM | POA: Diagnosis not present

## 2015-09-04 DIAGNOSIS — I6789 Other cerebrovascular disease: Secondary | ICD-10-CM | POA: Diagnosis not present

## 2015-09-04 DIAGNOSIS — I10 Essential (primary) hypertension: Secondary | ICD-10-CM | POA: Diagnosis not present

## 2015-09-04 DIAGNOSIS — I509 Heart failure, unspecified: Secondary | ICD-10-CM | POA: Diagnosis not present

## 2015-09-04 DIAGNOSIS — I110 Hypertensive heart disease with heart failure: Secondary | ICD-10-CM | POA: Diagnosis not present

## 2015-09-04 DIAGNOSIS — F0390 Unspecified dementia without behavioral disturbance: Secondary | ICD-10-CM | POA: Diagnosis not present

## 2015-09-04 DIAGNOSIS — F0280 Dementia in other diseases classified elsewhere without behavioral disturbance: Secondary | ICD-10-CM | POA: Diagnosis not present

## 2015-09-04 DIAGNOSIS — G301 Alzheimer's disease with late onset: Secondary | ICD-10-CM | POA: Diagnosis not present

## 2015-09-08 ENCOUNTER — Other Ambulatory Visit: Payer: Self-pay | Admitting: Cardiovascular Disease

## 2015-09-09 ENCOUNTER — Telehealth: Payer: Self-pay | Admitting: Endocrinology

## 2015-09-09 NOTE — Telephone Encounter (Signed)
Pt's husband is requesting a letter for the pt to be excused from jury duty please

## 2015-09-09 NOTE — Telephone Encounter (Signed)
See note below and please advise, Thanks! 

## 2015-09-10 ENCOUNTER — Telehealth: Payer: Self-pay | Admitting: Endocrinology

## 2015-09-10 DIAGNOSIS — I110 Hypertensive heart disease with heart failure: Secondary | ICD-10-CM | POA: Diagnosis not present

## 2015-09-10 DIAGNOSIS — F0280 Dementia in other diseases classified elsewhere without behavioral disturbance: Secondary | ICD-10-CM | POA: Diagnosis not present

## 2015-09-10 DIAGNOSIS — I509 Heart failure, unspecified: Secondary | ICD-10-CM | POA: Diagnosis not present

## 2015-09-10 DIAGNOSIS — E1121 Type 2 diabetes mellitus with diabetic nephropathy: Secondary | ICD-10-CM | POA: Diagnosis not present

## 2015-09-10 DIAGNOSIS — G301 Alzheimer's disease with late onset: Secondary | ICD-10-CM | POA: Diagnosis not present

## 2015-09-10 DIAGNOSIS — Z7689 Persons encountering health services in other specified circumstances: Secondary | ICD-10-CM

## 2015-09-10 NOTE — Telephone Encounter (Signed)
ok 

## 2015-09-10 NOTE — Telephone Encounter (Signed)
Please see below.

## 2015-09-10 NOTE — Telephone Encounter (Signed)
I contacted the pt's husband and advised letter is ready for pick up. Letter placed up front.

## 2015-09-10 NOTE — Telephone Encounter (Signed)
[name] with [name] called requesting orders   Orders: Physical therapy hoyer life  Medical social worker     Thank you

## 2015-09-10 NOTE — Telephone Encounter (Signed)
done

## 2015-09-11 DIAGNOSIS — G309 Alzheimer's disease, unspecified: Secondary | ICD-10-CM | POA: Diagnosis not present

## 2015-09-11 DIAGNOSIS — I5042 Chronic combined systolic (congestive) and diastolic (congestive) heart failure: Secondary | ICD-10-CM | POA: Diagnosis not present

## 2015-09-11 DIAGNOSIS — I6789 Other cerebrovascular disease: Secondary | ICD-10-CM | POA: Diagnosis not present

## 2015-09-11 DIAGNOSIS — I10 Essential (primary) hypertension: Secondary | ICD-10-CM | POA: Diagnosis not present

## 2015-09-11 NOTE — Telephone Encounter (Signed)
Left a voicemail advising of verbal ok. Requested a call back if [name] needed to discuss.

## 2015-09-18 DIAGNOSIS — E1121 Type 2 diabetes mellitus with diabetic nephropathy: Secondary | ICD-10-CM | POA: Diagnosis not present

## 2015-09-18 DIAGNOSIS — I509 Heart failure, unspecified: Secondary | ICD-10-CM | POA: Diagnosis not present

## 2015-09-18 DIAGNOSIS — G301 Alzheimer's disease with late onset: Secondary | ICD-10-CM | POA: Diagnosis not present

## 2015-09-18 DIAGNOSIS — F0280 Dementia in other diseases classified elsewhere without behavioral disturbance: Secondary | ICD-10-CM | POA: Diagnosis not present

## 2015-09-18 DIAGNOSIS — I110 Hypertensive heart disease with heart failure: Secondary | ICD-10-CM | POA: Diagnosis not present

## 2015-09-19 ENCOUNTER — Telehealth: Payer: Self-pay | Admitting: Endocrinology

## 2015-09-19 DIAGNOSIS — I110 Hypertensive heart disease with heart failure: Secondary | ICD-10-CM | POA: Diagnosis not present

## 2015-09-19 DIAGNOSIS — G301 Alzheimer's disease with late onset: Secondary | ICD-10-CM | POA: Diagnosis not present

## 2015-09-19 DIAGNOSIS — I509 Heart failure, unspecified: Secondary | ICD-10-CM | POA: Diagnosis not present

## 2015-09-19 DIAGNOSIS — F0280 Dementia in other diseases classified elsewhere without behavioral disturbance: Secondary | ICD-10-CM | POA: Diagnosis not present

## 2015-09-19 DIAGNOSIS — E1121 Type 2 diabetes mellitus with diabetic nephropathy: Secondary | ICD-10-CM | POA: Diagnosis not present

## 2015-09-19 NOTE — Telephone Encounter (Signed)
Visits are ok i printed letter

## 2015-09-19 NOTE — Telephone Encounter (Signed)
Done It is under "letters" tab

## 2015-09-19 NOTE — Telephone Encounter (Signed)
[name] from [facility] would like to know if an order for a gel mattress was sent in? Also can she get orders for: 1 nursing visit 1 social worker   Please advise   Thank you

## 2015-09-19 NOTE — Telephone Encounter (Signed)
I contacted Dana [name] with [name]. She wanted request a written order for heel protectors to prevent pressure sores. Can you put the order in and I will print? Thanks!

## 2015-09-19 NOTE — Telephone Encounter (Signed)
See note below and please advise if we can provide a written order for a gel mattress and verbal orders for a nurse visit and a Child psychotherapist. Thanks!

## 2015-09-20 ENCOUNTER — Other Ambulatory Visit: Payer: Self-pay | Admitting: *Deleted

## 2015-09-20 NOTE — Patient Outreach (Signed)
[facility] Care Management  09/20/2015  Minda MeoShirley M Teachey 02/08/1946 [id number]   RN Health Coach attempted 1st introductory outeeach call to patient.  Patient was unavailable. Unable to leave voicemail message. Per recording voicemail is full.  Gean MaidensFrances Aurielle Slingerland, BSN, RN [facility] RN Health Coach Phone: [phone] [facility] complies with applicable Federal civil rights laws and does not discriminate on the basis of race, color, national origin, age, disability, or sex. Espaol (Spanish)  [facility] cumple con las leyes federales de derechos civiles aplicables y no discrimina por motivos de raza, color, nacionalidad, edad, discapacidad o sexo.    Ti[place] Vi[place]t ([place]Vietnamese[place]  [facility] tun th? lu?t dn quy?n hi?n hnh c?a Lin bang v khng phn bi?t ?i x? d?a trn ch?ng t?c, mu da, ngu?n g?c qu?c gia, ? tu?i, khuy?t t?t, ho?c gi?i tnh.    (Arabic)    [facility]                      .

## 2015-09-20 NOTE — Telephone Encounter (Signed)
Orders faxed

## 2015-09-24 ENCOUNTER — Other Ambulatory Visit: Payer: Self-pay | Admitting: *Deleted

## 2015-09-24 NOTE — Patient Outreach (Signed)
[facility] Care Management  09/24/2015  Minda MeoShirley M Papaleo 12/05/1945 [id number]  RN Health Coach telephone call to patient husband .  Hipaa compliance verified. RN Health Coach  introduced self to patient husband Carolin GuernseyKeel Sadiq/ Patient has Alzheimers.. Husband was very agreeable to follow up out reach calls. Patient was referred by Kathyrn SheriffJuana Wallace.  Plan: RN Health Coach will do follow up call within a week for an assessment. Husband  agreed to follow up call on early mornings @10  am.  Gean MaidensFrances Kora Groom, BSN, RN [facility] RN Health Coach Phone: [phone] [facility] complies with applicable Federal civil rights laws and does not discriminate on the basis of race, color, national origin, age, disability, or sex. Espaol (Spanish)  [facility] cumple con las leyes federales de derechos civiles aplicables y no discrimina por motivos de raza, color, nacionalidad, edad, discapacidad o sexo.    [place] ([place]namese)  [facility] tun th? lu?t dn quy?n hi?n hnh c?a Lin bang v khng phn bi?t ?i x? d?a trn ch?ng t?c, mu da, ngu?n g?c qu?c gia, ? tu?i, khuy?t t?t, ho?c gi?i tnh.    (Arabic)    [facility]                      .

## 2015-09-26 DIAGNOSIS — G441 Vascular headache, not elsewhere classified: Secondary | ICD-10-CM | POA: Diagnosis not present

## 2015-09-26 DIAGNOSIS — F0390 Unspecified dementia without behavioral disturbance: Secondary | ICD-10-CM | POA: Diagnosis not present

## 2015-09-27 ENCOUNTER — Encounter: Payer: Self-pay | Admitting: Endocrinology

## 2015-09-30 ENCOUNTER — Ambulatory Visit: Payer: Self-pay | Admitting: *Deleted

## 2015-09-30 ENCOUNTER — Other Ambulatory Visit: Payer: Self-pay | Admitting: *Deleted

## 2015-09-30 NOTE — Patient Outreach (Signed)
[facility] Care Management  09/30/2015  Minda MeoShirley M Parslow 10/11/1945 [id number]   RN Health Coach telephone call to patient husband.  Patient has alzheimers and husband is care giver.  Hipaa compliance verified. Per husband stated he was sleeping. This was the time patient husband asked to be called.  RN asked what time he would like to be called back. Per patient call him back after 4p. RN left case open for call back. RN called patient husband back and he stated he was cooking. Patient husband took RN number and stated he will call me back tomorrow.  Gean MaidensFrances Miken Stecher, BSN, RN [facility] RN Health Coach Phone: [phone] [facility] complies with applicable Federal civil rights laws and does not discriminate on the basis of race, color, national origin, age, disability, or sex. Espaol (Spanish)  [facility] cumple con las leyes federales de derechos civiles aplicables y no discrimina por motivos de raza, color, nacionalidad, edad, discapacidad o sexo.    [place] ([place]Vietnamese)  [facility] tun th? lu?t dn quy?n hi?n hnh c?a Lin bang v khng phn bi?t ?i x? d?a trn ch?ng t?c, mu da, ngu?n g?c qu?c gia, ? tu?i, khuy?t t?t, ho?c gi?i tnh.    (Arabic)    [facility]                      .

## 2015-10-01 ENCOUNTER — Other Ambulatory Visit: Payer: Self-pay | Admitting: Cardiovascular Disease

## 2015-10-02 DIAGNOSIS — I10 Essential (primary) hypertension: Secondary | ICD-10-CM | POA: Diagnosis not present

## 2015-10-02 DIAGNOSIS — F0390 Unspecified dementia without behavioral disturbance: Secondary | ICD-10-CM | POA: Diagnosis not present

## 2015-10-02 DIAGNOSIS — G309 Alzheimer's disease, unspecified: Secondary | ICD-10-CM | POA: Diagnosis not present

## 2015-10-02 DIAGNOSIS — I5042 Chronic combined systolic (congestive) and diastolic (congestive) heart failure: Secondary | ICD-10-CM | POA: Diagnosis not present

## 2015-10-02 DIAGNOSIS — I6789 Other cerebrovascular disease: Secondary | ICD-10-CM | POA: Diagnosis not present

## 2015-10-03 ENCOUNTER — Other Ambulatory Visit: Payer: Self-pay | Admitting: Endocrinology

## 2015-10-07 DIAGNOSIS — I110 Hypertensive heart disease with heart failure: Secondary | ICD-10-CM | POA: Diagnosis not present

## 2015-10-07 DIAGNOSIS — F0280 Dementia in other diseases classified elsewhere without behavioral disturbance: Secondary | ICD-10-CM | POA: Diagnosis not present

## 2015-10-07 DIAGNOSIS — E1121 Type 2 diabetes mellitus with diabetic nephropathy: Secondary | ICD-10-CM | POA: Diagnosis not present

## 2015-10-07 DIAGNOSIS — G301 Alzheimer's disease with late onset: Secondary | ICD-10-CM | POA: Diagnosis not present

## 2015-10-07 DIAGNOSIS — I509 Heart failure, unspecified: Secondary | ICD-10-CM | POA: Diagnosis not present

## 2015-10-09 DIAGNOSIS — E1121 Type 2 diabetes mellitus with diabetic nephropathy: Secondary | ICD-10-CM | POA: Diagnosis not present

## 2015-10-09 DIAGNOSIS — I110 Hypertensive heart disease with heart failure: Secondary | ICD-10-CM | POA: Diagnosis not present

## 2015-10-09 DIAGNOSIS — I6789 Other cerebrovascular disease: Secondary | ICD-10-CM | POA: Diagnosis not present

## 2015-10-09 DIAGNOSIS — G309 Alzheimer's disease, unspecified: Secondary | ICD-10-CM | POA: Diagnosis not present

## 2015-10-09 DIAGNOSIS — G301 Alzheimer's disease with late onset: Secondary | ICD-10-CM | POA: Diagnosis not present

## 2015-10-09 DIAGNOSIS — I10 Essential (primary) hypertension: Secondary | ICD-10-CM | POA: Diagnosis not present

## 2015-10-09 DIAGNOSIS — I5042 Chronic combined systolic (congestive) and diastolic (congestive) heart failure: Secondary | ICD-10-CM | POA: Diagnosis not present

## 2015-10-09 DIAGNOSIS — F0280 Dementia in other diseases classified elsewhere without behavioral disturbance: Secondary | ICD-10-CM | POA: Diagnosis not present

## 2015-10-09 DIAGNOSIS — I509 Heart failure, unspecified: Secondary | ICD-10-CM | POA: Diagnosis not present

## 2015-10-14 ENCOUNTER — Other Ambulatory Visit: Payer: Self-pay | Admitting: Cardiovascular Disease

## 2015-10-17 ENCOUNTER — Telehealth: Payer: Self-pay | Admitting: Endocrinology

## 2015-10-17 DIAGNOSIS — E1121 Type 2 diabetes mellitus with diabetic nephropathy: Secondary | ICD-10-CM | POA: Diagnosis not present

## 2015-10-17 DIAGNOSIS — G301 Alzheimer's disease with late onset: Secondary | ICD-10-CM | POA: Diagnosis not present

## 2015-10-17 DIAGNOSIS — F0280 Dementia in other diseases classified elsewhere without behavioral disturbance: Secondary | ICD-10-CM | POA: Diagnosis not present

## 2015-10-17 DIAGNOSIS — I509 Heart failure, unspecified: Secondary | ICD-10-CM | POA: Diagnosis not present

## 2015-10-17 DIAGNOSIS — I110 Hypertensive heart disease with heart failure: Secondary | ICD-10-CM | POA: Diagnosis not present

## 2015-10-17 NOTE — Telephone Encounter (Signed)
Hospice needs to know if they will be the ones taking care of the pt # [phone]

## 2015-10-18 NOTE — Telephone Encounter (Signed)
Yes, please.

## 2015-10-18 NOTE — Telephone Encounter (Signed)
See note below and please advise, Thanks! 

## 2015-10-18 NOTE — Telephone Encounter (Signed)
I contacted hospice and advised of note below. Hospice agreed managing the pt's symptoms.

## 2015-10-22 ENCOUNTER — Other Ambulatory Visit: Payer: Self-pay | Admitting: Cardiovascular Disease

## 2015-10-22 NOTE — Telephone Encounter (Signed)
lisinopril (PRINIVIL,ZESTRIL) 2.5 MG tablet  Medication   Date: 10/02/2015  [facility] [address] [facility]  Ordering/Authorizing: Kathleene Hazelhristopher D McAlhany, MD      Order Providers    Prescribing Provider Encounter Provider   Kathleene Hazelhristopher D McAlhany, MD Kathleene Hazelhristopher D McAlhany, MD    Medication Detail      Disp Refills Start End     lisinopril (PRINIVIL,ZESTRIL) 2.5 MG tablet 14 tablet 0 10/02/2015     Sig: TAKE 1 TABLET (2.5 MG TOTAL) BY MOUTH EVERY MORNING.    Notes to Pharmacy: Please advise patient to contact provider office [phone] to schedule appointment for additional refills. (LAST attempt 10/02/15)    E-Prescribing Status: Receipt confirmed by pharmacy (10/02/2015 8:39 AM EDT)     Pharmacy    [id number] [place], [place] - [address]

## 2015-10-23 ENCOUNTER — Other Ambulatory Visit: Payer: Self-pay | Admitting: Endocrinology

## 2015-10-25 ENCOUNTER — Other Ambulatory Visit: Payer: Self-pay | Admitting: Endocrinology

## 2015-10-25 ENCOUNTER — Telehealth: Payer: Self-pay | Admitting: Family Medicine

## 2015-10-25 NOTE — Telephone Encounter (Signed)
Please advise if ok to refill. Rx is listed under the pt's cardiologist. Thanks!

## 2015-10-25 NOTE — Telephone Encounter (Signed)
We received a fax from the after hours on call center that patients husband called stating that patient was out of her lisinopril 2.5mg . The nurse that was speaking with Mr. Newell CoralWare Teresa Irvin, RN told him that she would call in 2 pills to patients pharmacy and advised Mr. [name] to call our office in the morning for a refill prescription. Call date/time 10/24/15 @ 5:57 pm.   I called Mr. [name] to let him know that lisinopril is a bp medication and Dr. Karel JarvisAquino doesn't prescribe this medication for the patient. I did advise him to call the prescribing physicians office for a refill & for future reference he call always check the medication bottle to see who prescribes a medication to make sure he contacts the right office.  I also called the call center to let them know that this medication wasn't prescribed by Dr. Karel JarvisAquino, and shouldn't have been called in on her behalf and that the form they sent with the msg to be signed to ok the prescription call in would not be faxed back to them.

## 2015-10-28 ENCOUNTER — Telehealth: Payer: Self-pay | Admitting: Endocrinology

## 2015-10-28 ENCOUNTER — Other Ambulatory Visit: Payer: Self-pay | Admitting: *Deleted

## 2015-10-28 NOTE — Telephone Encounter (Signed)
Pt husband calling for refill on lisinopril called [facility]

## 2015-10-28 NOTE — Telephone Encounter (Signed)
On 4/7 Dr. Everardo AllEllison sent in a 2 week supply and told her to call the office for an appointment. I can't refill it until she's seen.

## 2015-11-02 DIAGNOSIS — I6789 Other cerebrovascular disease: Secondary | ICD-10-CM | POA: Diagnosis not present

## 2015-11-02 DIAGNOSIS — G309 Alzheimer's disease, unspecified: Secondary | ICD-10-CM | POA: Diagnosis not present

## 2015-11-02 DIAGNOSIS — I10 Essential (primary) hypertension: Secondary | ICD-10-CM | POA: Diagnosis not present

## 2015-11-02 DIAGNOSIS — F0390 Unspecified dementia without behavioral disturbance: Secondary | ICD-10-CM | POA: Diagnosis not present

## 2015-11-02 DIAGNOSIS — I5042 Chronic combined systolic (congestive) and diastolic (congestive) heart failure: Secondary | ICD-10-CM | POA: Diagnosis not present

## 2015-11-05 ENCOUNTER — Encounter: Payer: Self-pay | Admitting: *Deleted

## 2015-11-05 ENCOUNTER — Other Ambulatory Visit: Payer: Self-pay | Admitting: *Deleted

## 2015-11-05 NOTE — Patient Outreach (Addendum)
[facility]) Care Management  [facility]  11/05/2015   Minda MeoShirley M Leedom 02/11/1946 [id number]  Subjective: RN Health Coach telephone call to patient.  [employer] compliance verified with caregiver patient husband. Patient has alzheimer's. RN  Health Coach has been unable to do a telephonic assessment with caregiver due to he has no assistance with wife. When Health Coach called husband he  is tied up with wife unable to talk. When husband called back Health Coach on line with another patient . Per caregiver her blood sugar today was 133. He could only remember the sign of low blood sugar of her being sweaty and out of it as he stated. Per husband he gave her peppermint candy and sugar water. He didn't know any other signs. He stated it had been a long time since he read about them. Per husband he feeds her soft food. Patient can pick up crackers but he has to feed and bathe her. Patient is incontinent of bowel and bladder. Patient has a rash that comes on buttocks but he stated he gets it healed up with the creme's he uses. Patient Hgb A1C is 7.9. Husband didn't know the numbers. As patient husband was talking about the adult briefs and chux he uses and the cost. RN Health Coach asked him about ordering from his [employer]. Caregiver  has not been using his catalog and wasn't aware he could get things like adult briefs or chux. Per husband he had it in the drawer just hadn't ordered anything.  Per husband patient has a small ulcer on her heel. Husband stated she has an appointment with her DR. RN told husband to call Dr so this could be evaluated. RN Health Coach also discussed heel protectors. Husband has agreed to follow up telephonic outreach.   Objective:   Encounter Medications:  Outpatient Encounter Prescriptions as of 11/05/2015  Medication Sig Note  . aspirin EC 325 MG tablet TAKE 1 TABLET (325 MG TOTAL) BY MOUTH DAILY.   Marland Kitchen. atorvastatin (LIPITOR) 20 MG tablet TAKE 1 TABLET  BY MOUTH EVERY DAY   . carvedilol (COREG) 3.125 MG tablet TAKE 1 TABLET (3.125 MG TOTAL) BY MOUTH 2 (TWO) TIMES DAILY WITH A MEAL.   . citalopram (CELEXA) 20 MG tablet TAKE 1 TABLET (20 MG TOTAL) BY MOUTH DAILY.   Marland Kitchen. donepezil (ARICEPT) 10 MG tablet Take 10 mg by mouth at bedtime.   . furosemide (LASIX) 40 MG tablet Take 40 mg by mouth daily.    . hyoscyamine (LEVSIN SL) 0.125 MG SL tablet PLACE 1 TABLET UNDER THE TONGUE TWICE DAILY AS NEEDED FOR CRAMPS AND SPASMS.   Marland Kitchen. lisinopril (PRINIVIL,ZESTRIL) 2.5 MG tablet TAKE 1 TABLET (2.5 MG TOTAL) BY MOUTH EVERY MORNING.PLEASE CONTACT OFFICE FOR APPT/REFLLS   . loperamide (IMODIUM) 2 MG capsule Take 1 capsule (2 mg total) by mouth as needed for diarrhea or loose stools.   Marland Kitchen. NAMENDA XR 28 MG CP24 24 hr capsule  08/30/2015: Received from: External Pharmacy  . Propylene Glycol (SYSTANE BALANCE OP) Apply 1 drop to eye 3 (three) times daily as needed (dry eyes).   . sitaGLIPtin-metformin (JANUMET) 50-1000 MG tablet Take 1 tablet by mouth 2 (two) times daily with a meal.   . sodium chloride (OCEAN) 0.65 % SOLN nasal spray Place 2 sprays into both nostrils as needed for congestion.   . Valproate Sodium (VALPROIC ACID) 250 MG/5ML SOLN Take 10 mls daily 06/28/2015: Ordered 06/28/15  . divalproex (DEPAKOTE ER) 250 MG  24 hr tablet Take 2 tablets (500 mg total) by mouth every morning. (Patient not taking: Reported on 11/05/2015) 06/28/2015: Took medication today. Will stop when he picks up liquid form.  . memantine (NAMENDA) 10 MG tablet Take 1 tablet (10 mg total) by mouth 2 (two) times daily. (Patient not taking: Reported on 11/05/2015)    No facility-administered encounter medications on file as of 11/05/2015.  Patient was started on Mirtazapine 30 mg po HS    Functional Status:  In your present state of health, do you have any difficulty performing the following activities: 11/05/2015 05/23/2015  Hearing? N N  Vision? Y N  Difficulty concentrating or making  decisions? Malvin Johns  Walking or climbing stairs? Y Y  Dressing or bathing? Y Y  Doing errands, shopping? Malvin Johns  Preparing Food and eating ? Y -  Using the Toilet? Y -  In the past six months, have you accidently leaked urine? Y -  Do you have problems with loss of bowel control? Y -  Managing your Medications? Y -  Managing your Finances? Y -  Housekeeping or managing your Housekeeping? Y -    Fall/Depression Screening: PHQ 2/9 Scores 11/05/2015 04/29/2015  PHQ - 2 Score - 0  Exception Documentation Other- indicate reason in comment box -   [facility] CM Care Plan Problem One        Most Recent Value   Care Plan Problem One  Caregiver knowledge deficit in managemet of diabetes   Role Documenting the Problem One  Health Coach   Care Plan for Problem One  Active   [facility] Long Term Goal (31-90 days)  Patient will not have any admissions within the next 90 days   [facility] Long Term Goal Start Date  11/05/15   Interventions for Problem One Long Term Goal  Caregiver will make sure patient keeps appointment with primay care physician. Caregiver will give patient medicatios as ordered by physician. RN Health Coach will follow up cargivegive for discussion and teachback   [facility] CM Short Term Goal #1 (0-30 days)  Caregiver will be able to verbalize signs and symptoms of hypo and hyper glyvemia within 30 days   [facility] CM Short Term Goal #1 Start Date  11/05/15   Interventions for Short Term Goal #1  RN Health Coach will send cargiver a picture chart of signs and symptoms of hypo and hyperglycemia. RN will send caregiver written EMMI information on hyperhypoglycemia. RN will follow up with  discussion and teachback   [facility] CM Short Term Goal #2 (0-30 days)  Caregiver will be able to verbalize treatment for high and low blood sugar within 30 ndays   [facility] CM Short Term Goal #2 Start Date  11/05/15   Interventions for Short Term Goal #2  RN will send caregiver educational action plan for high and low blood sugar. RN will follow up  discussion and teachback     Assessment:  Caregiver will benefit from Health Coach telephonic outreach for education and support for diabetes self management.   Plan:  RN will send caregiver a picture chart of hypo and hyperglycemia RN will send EMMI educational information on hypo and hyperglycemia RN will EMMI educational material on Diabetes Why get your A1C checked RN will send educational material treating high and low blood sugars RN will send caregiver nutritional coupons for Glucerna RN will follow up outreach within a month for teach back and discussion  Gean Maidens BSN RN [facility] Care Management [phone]

## 2015-11-08 ENCOUNTER — Other Ambulatory Visit: Payer: Self-pay | Admitting: Endocrinology

## 2015-11-09 DIAGNOSIS — I5042 Chronic combined systolic (congestive) and diastolic (congestive) heart failure: Secondary | ICD-10-CM | POA: Diagnosis not present

## 2015-11-09 DIAGNOSIS — I10 Essential (primary) hypertension: Secondary | ICD-10-CM | POA: Diagnosis not present

## 2015-11-09 DIAGNOSIS — I6789 Other cerebrovascular disease: Secondary | ICD-10-CM | POA: Diagnosis not present

## 2015-11-09 DIAGNOSIS — G309 Alzheimer's disease, unspecified: Secondary | ICD-10-CM | POA: Diagnosis not present

## 2015-11-13 NOTE — Progress Notes (Signed)
Cardiology Office Note:    Date:  11/14/2015   ID:  Dana [name], DOB Jul 08, 1946, MRN [id number]  PCP:  Romero Belling, MD  Cardiologist:  Dr. Verne Carrow   Electrophysiologist:  n/a  Referring MD: Romero Belling, MD   Chief Complaint  Patient presents with  . Congestive Heart Failure    Follow-up    History of Present Illness:     Dana [name] is a 70 y.o. female with a hx of cardiomyopathy, HTN, DM, HLD, anxiety/depression. She was admitted to [facility] August 2014 with lower extremity edema, dyspnea on exertion. Echo 03/09/13 with LVEF 25-30%. No significant valve disease. Cardiac cath was discussed to exclude CAD but she was very agitated at times requiring a sitter (presumed to be from baseline dementia). She was combative with nursing staff. Stress myoview 03/14/13 was intermediate risk myoview with moderate anteroapical wall MI and moderate peri infarct ischemia. EF severely reduced at 33%. Given her dementia, she was managed conservatively. She was diuresed well during admission and discharged home on Lasix.  Last seen by Dr. Verne Carrow in 12/15.  Seen by PCP recently and referred back for FU on HF.    Here with her husband. He helps with most of the hx given patient's dementia.  There is no significant dyspnea.  She is sedentary and cannot stand.  She denies chest pain.  No syncope.  Denies orthopnea, PND, edema.     Past Medical History  Diagnosis Date  . ALLERGIC RHINITIS 02/08/2007  . ANXIETY 02/08/2007  . DEPRESSION 02/08/2007  . DIABETES MELLITUS, TYPE I 02/08/2007  . Headache(784.0) 11/06/2008  . HYPERCHOLESTEROLEMIA 11/05/2008  . HYPERTENSION 02/08/2007  . URINARY INCONTINENCE 02/08/2007  . Dyslipidemia   . Non-ulcer dyspepsia   . Leukopenia   . DM retinopathy (HCC)     background  . 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 Dementia   . CHF (congestive heart failure) (HCC)   . Anemia     takes iron  . Skin abnormality     both knees with  opne ares and left shoulder with open areas, changes dressings prn    Past Surgical History  Procedure Laterality Date  . Total knee arthroplasty  1990    left  . Panendoscopy  10/27/1999  . Stress cardiolite  12/14/2003  . Electrocardiogram  05/17/2006  . Abdominal hysterectomy    . Bladder tach  years ago  . Colonoscopy with propofol N/A 06/21/2014    Procedure: COLONOSCOPY WITH PROPOFOL;  Surgeon: Rachael Fee, MD;  Location: [facility];  Service: Endoscopy;  Laterality: N/A;    Current Medications: Current Meds  Medication Sig  . aspirin EC 325 MG tablet TAKE 1 TABLET (325 MG TOTAL) BY MOUTH DAILY.  Marland Kitchen atorvastatin (LIPITOR) 20 MG tablet TAKE 1 TABLET BY MOUTH EVERY DAY  . carvedilol (COREG) 3.125 MG tablet TAKE 1 TABLET (3.125 MG TOTAL) BY MOUTH 2 (TWO) TIMES DAILY WITH A MEAL.  . citalopram (CELEXA) 20 MG tablet TAKE 1 TABLET (20 MG TOTAL) BY MOUTH DAILY.  . furosemide (LASIX) 40 MG tablet Take 40 mg by mouth daily.   . hyoscyamine (LEVSIN SL) 0.125 MG SL tablet PLACE 1 TABLET UNDER THE TONGUE TWICE DAILY AS NEEDED FOR CRAMPS AND SPASMS.  Marland Kitchen lisinopril (PRINIVIL,ZESTRIL) 2.5 MG tablet Take 1 tablet once daily  . loperamide (IMODIUM) 2 MG capsule Take 1 capsule (2 mg total) by mouth as needed for diarrhea or loose stools.  Marland Kitchen  Memantine HCl-Donepezil HCl 28-10 MG CP24 Take 28 mg by mouth at bedtime.  . mirtazapine (REMERON SOL-TAB) 30 MG disintegrating tablet Take 30 mg by mouth at bedtime.   Marland Kitchen NAMENDA XR 28 MG CP24 24 hr capsule Take 28 mg by mouth daily.   Marland Kitchen Propylene Glycol (SYSTANE BALANCE OP) Apply 1 drop to eye 3 (three) times daily as needed (dry eyes).  . sitaGLIPtin-metformin (JANUMET) 50-1000 MG tablet Take 1 tablet by mouth 2 (two) times daily with a meal.  . sodium chloride (OCEAN) 0.65 % SOLN nasal spray Place 2 sprays into both nostrils as needed for congestion.  . Valproate Sodium (VALPROIC ACID) 250 MG/5ML SOLN Take 10 mls daily  . [DISCONTINUED] lisinopril  (PRINIVIL,ZESTRIL) 2.5 MG tablet TAKE 1 TABLET (2.5 MG TOTAL) BY MOUTH EVERY MORNING. PLEASE CONTACT OFFICE FOR APPT/REFLLS     Allergies:   Aspirin and Codeine   Social History   Social History  . Marital Status: Married    Spouse Name: N/A  . Number of Children: 2  . Years of Education: N/A   Occupational History  . Retired    Social History Main Topics  . Smoking status: Never Smoker   . Smokeless tobacco: Never Used  . Alcohol Use: No  . Drug Use: No  . Sexual Activity: No   Other Topics Concern  . None   Social History Narrative     Family History:  The patient's family history includes Alcohol abuse in her son; CAD in her father; Diabetes in her father. There is no history of Cancer, Colon cancer, Colon polyps, Kidney disease, Esophageal cancer, or Gallbladder disease.   ROS:   Please see the history of present illness.    ROS All other systems reviewed and are negative.   Physical Exam:    VS:  BP 110/60 mmHg  Pulse 100  Ht  (1.6 m)  Wt 154 lb (69.854 kg)  BMI 27.29 kg/m2   GEN: Well nourished, well developed, in no acute distress HEENT: normal Neck: no JVD, no masses Cardiac: Normal S1/S2, RRR; no murmurs, rubs, or gallops, no edema;     Respiratory:  clear to auscultation bilaterally; no wheezing, rhonchi or rales GI: soft, nontender, nondistended MS: no deformity or atrophy Skin: warm and dry Neuro: No focal deficits  Psych: Alert;  Thinks her husband is her niece.    Wt Readings from Last 3 Encounters:  11/14/15 154 lb (69.854 kg)  05/29/15 156 lb (70.761 kg)  05/24/15 163 lb 1.6 oz (73.982 kg)      Studies/Labs Reviewed:     EKG:  EKG is  ordered today.  The ekg ordered today demonstrates NSR, HR 97, LBBB  Recent Labs: 11/22/2014: Magnesium 1.9 02/27/2015: TSH 1.336 04/09/2015: B Natriuretic Peptide 138.1* 05/22/2015: ALT 16 05/23/2015: Hemoglobin 9.9*; Platelets 224 05/24/2015: BUN 14; Creatinine, Ser 0.73; Potassium 3.6; Sodium 138    Recent Lipid Panel    Component Value Date/Time   CHOL 170 08/27/2014 1615   TRIG 103.0 08/27/2014 1615   TRIG 42 05/11/2006 1036   HDL 77.00 08/27/2014 1615   CHOLHDL 2 08/27/2014 1615   CHOLHDL 2.9 CALC 05/11/2006 1036   VLDL 20.6 08/27/2014 1615   LDLCALC 72 08/27/2014 1615   LDLDIRECT 141.5 07/05/2013 1344   LDLDIRECT 126.0 05/11/2006 1036    Additional studies/ records that were reviewed today include:    Echo 02/27/15 Moderate concentric LVH, EF 40-45%, grade 1 diastolic dysfunction  Myoview 1/61/09 Intermediate risk, moderate anteroapical  MI and moderate peri-Infarct ischemia, EF 33%   ASSESSMENT:     1. Cardiomyopathy, ischemic   2. Chronic systolic CHF (congestive heart failure), NYHA class 2 (HCC)   3. Essential hypertension   4. Hyperlipidemia   5. LBBB (left bundle branch block)     PLAN:     In order of problems listed above:  1. Ischemic CM - Dx in 8/14 with EF 25-30%.  Myoview with ant-apical scar and peri-infarct ischemia.  She is presumed to have CAD.  She has significant dementia and is being managed medically.  She denies chest pain.  Continue beta-blocker, ACE inhibitor.    2. Chronic systolic HF - She cannot stand to weigh.  Weight here stable.  She looks euvolemic.  Her husband is concerned that she looks skinnier each day.  She can try to take Lasix 40 QOD alt with 20 QOD. If she develops edema, he knows to resume Lasix 40 QD.  Continue beta-blocker, ACE inhibitor.  3. HTN - Controlled.  4. HL Continue statin.  Followed by PCP.  5. LBBB - She is not a candidate for device (ICD or CRT) therapies.     Medication Adjustments/Labs and Tests Ordered: Current medicines are reviewed at length with the patient today.  Concerns regarding medicines are outlined above.  Medication changes, Labs and Tests ordered today are outlined in the Patient Instructions noted below. Patient Instructions  Medication Instructions:  1) Take Lasix 40mg  every other  day, alternating with Lasix 20mg  (1/2 tab) every other day.  If swelling worsens, you may go back to taking Lasix 40mg  once daily. Labwork: None Testing/Procedures: None Follow-Up: Your physician wants you to follow-up in: 1 year with Dr. Clifton JamesMcAlhany. You will receive a reminder letter in the mail two months in advance. If you don't receive a letter, please call our office to schedule the follow-up appointment. Any Other Special Instructions Will Be Listed Below (If Applicable). If you need a refill on your cardiac medications before your next appointment, please call your pharmacy.   Signed, Tereso NewcomerScott Rambo Sarafian, PA-C  11/14/2015 4:53 PM    [facility] [address], [place], [place]  [address] Phone: [phone]) [phone]; Fax: [phone]

## 2015-11-14 ENCOUNTER — Encounter: Payer: Self-pay | Admitting: Physician Assistant

## 2015-11-14 ENCOUNTER — Ambulatory Visit (INDEPENDENT_AMBULATORY_CARE_PROVIDER_SITE_OTHER): Payer: Commercial Managed Care - HMO | Admitting: Physician Assistant

## 2015-11-14 VITALS — BP 110/60 | HR 100 | Ht 63.0 in | Wt 154.0 lb

## 2015-11-14 DIAGNOSIS — I5022 Chronic systolic (congestive) heart failure: Secondary | ICD-10-CM | POA: Diagnosis not present

## 2015-11-14 DIAGNOSIS — I10 Essential (primary) hypertension: Secondary | ICD-10-CM

## 2015-11-14 DIAGNOSIS — I255 Ischemic cardiomyopathy: Secondary | ICD-10-CM | POA: Diagnosis not present

## 2015-11-14 DIAGNOSIS — E785 Hyperlipidemia, unspecified: Secondary | ICD-10-CM

## 2015-11-14 DIAGNOSIS — I447 Left bundle-branch block, unspecified: Secondary | ICD-10-CM

## 2015-11-14 MED ORDER — LISINOPRIL 2.5 MG PO TABS
ORAL_TABLET | ORAL | Status: DC
Start: 2015-11-14 — End: 2017-08-13

## 2015-11-14 MED ORDER — FUROSEMIDE 40 MG PO TABS: ORAL_TABLET | ORAL | Status: AC

## 2015-11-14 NOTE — Patient Instructions (Addendum)
Medication Instructions:  1) Take Lasix 40mg  every other day, alternating with Lasix 20mg  (1/2 tab) every other day.  If swelling worsens, you may go back to taking Lasix 40mg  once daily. Labwork: None Testing/Procedures: None Follow-Up: Your physician wants you to follow-up in: 1 year with Dr. Clifton JamesMcAlhany. You will receive a reminder letter in the mail two months in advance. If you don't receive a letter, please call our office to schedule the follow-up appointment. Any Other Special Instructions Will Be Listed Below (If Applicable). If you need a refill on your cardiac medications before your next appointment, please call your pharmacy.

## 2015-11-18 ENCOUNTER — Other Ambulatory Visit: Payer: Self-pay | Admitting: Endocrinology

## 2015-12-02 DIAGNOSIS — I6789 Other cerebrovascular disease: Secondary | ICD-10-CM | POA: Diagnosis not present

## 2015-12-02 DIAGNOSIS — G309 Alzheimer's disease, unspecified: Secondary | ICD-10-CM | POA: Diagnosis not present

## 2015-12-02 DIAGNOSIS — I5042 Chronic combined systolic (congestive) and diastolic (congestive) heart failure: Secondary | ICD-10-CM | POA: Diagnosis not present

## 2015-12-02 DIAGNOSIS — I10 Essential (primary) hypertension: Secondary | ICD-10-CM | POA: Diagnosis not present

## 2015-12-02 DIAGNOSIS — F0390 Unspecified dementia without behavioral disturbance: Secondary | ICD-10-CM | POA: Diagnosis not present

## 2015-12-03 ENCOUNTER — Ambulatory Visit: Payer: Self-pay | Admitting: *Deleted

## 2015-12-03 ENCOUNTER — Telehealth: Payer: Self-pay | Admitting: Endocrinology

## 2015-12-03 DIAGNOSIS — F0280 Dementia in other diseases classified elsewhere without behavioral disturbance: Secondary | ICD-10-CM

## 2015-12-03 DIAGNOSIS — G301 Alzheimer's disease with late onset: Principal | ICD-10-CM

## 2015-12-03 NOTE — Telephone Encounter (Signed)
PT husband needs to talk with you about possibly [facility]

## 2015-12-05 NOTE — Telephone Encounter (Signed)
Attempted to reach the pt's husband. He was unavailable and did not have a working a vm. Will try again at a later time.

## 2015-12-09 ENCOUNTER — Telehealth: Payer: Self-pay | Admitting: Endocrinology

## 2015-12-09 DIAGNOSIS — G309 Alzheimer's disease, unspecified: Secondary | ICD-10-CM | POA: Diagnosis not present

## 2015-12-09 DIAGNOSIS — I6789 Other cerebrovascular disease: Secondary | ICD-10-CM | POA: Diagnosis not present

## 2015-12-09 DIAGNOSIS — I10 Essential (primary) hypertension: Secondary | ICD-10-CM | POA: Diagnosis not present

## 2015-12-09 DIAGNOSIS — I5042 Chronic combined systolic (congestive) and diastolic (congestive) heart failure: Secondary | ICD-10-CM | POA: Diagnosis not present

## 2015-12-09 NOTE — Telephone Encounter (Signed)
PT son called and said that PT keeps getting bed sores on her back and her foot and he is concerned, would like a call back [phone]

## 2015-12-09 NOTE — Telephone Encounter (Signed)
Attempted to reach the pt's husband. He was unavailable and did not have a working vm. Will try again at a later time.

## 2015-12-10 NOTE — Telephone Encounter (Signed)
Ok, i requested hh and hospice

## 2015-12-10 NOTE — Telephone Encounter (Signed)
I contacted the pt's son. Message fwd to MD in separate telephone message.

## 2015-12-10 NOTE — Telephone Encounter (Signed)
I contacted the pt's son. He stated the pt has had bedsores develop on her heels and buttock. He stated the pt has become bed ridden and cannot ambulate. The pt's son and pt's husband is requesting home health to come and start helping if possible. Please advise on how to proceed. Thanks!

## 2015-12-11 NOTE — Telephone Encounter (Signed)
I contacted the pt's son and advised we have placed the referrals. He voiced understanding and stated he would let the pt's husband know.

## 2015-12-12 ENCOUNTER — Telehealth: Payer: Self-pay | Admitting: Endocrinology

## 2015-12-12 NOTE — Telephone Encounter (Signed)
FYI:  Hospice sees that she does not meet the criteria yet for help with them, the palliative care team is seeing her, he feels that the husband needs additional help with the pt so they are going to try to get help in some way for them  [phone] [facility]

## 2015-12-13 NOTE — Telephone Encounter (Signed)
Please read message below and advise.  

## 2015-12-13 NOTE — Telephone Encounter (Signed)
ok 

## 2015-12-17 ENCOUNTER — Ambulatory Visit: Payer: Self-pay | Admitting: Neurology

## 2015-12-18 ENCOUNTER — Other Ambulatory Visit: Payer: Self-pay | Admitting: Endocrinology

## 2015-12-19 ENCOUNTER — Encounter: Payer: Self-pay | Admitting: Family Medicine

## 2016-01-01 ENCOUNTER — Emergency Department (HOSPITAL_COMMUNITY)
Admission: EM | Admit: 2016-01-01 | Discharge: 2016-01-01 | Disposition: A | Discharge status: Home / Self Care | Payer: Commercial Managed Care - HMO | Attending: Emergency Medicine | Admitting: Emergency Medicine

## 2016-01-01 ENCOUNTER — Emergency Department (HOSPITAL_COMMUNITY): Payer: Commercial Managed Care - HMO

## 2016-01-01 ENCOUNTER — Encounter (HOSPITAL_COMMUNITY): Payer: Self-pay | Admitting: Emergency Medicine

## 2016-01-01 DIAGNOSIS — Z7982 Long term (current) use of aspirin: Secondary | ICD-10-CM | POA: Diagnosis not present

## 2016-01-01 DIAGNOSIS — Z79899 Other long term (current) drug therapy: Secondary | ICD-10-CM | POA: Insufficient documentation

## 2016-01-01 DIAGNOSIS — R079 Chest pain, unspecified: Secondary | ICD-10-CM | POA: Diagnosis not present

## 2016-01-01 DIAGNOSIS — E10319 Type 1 diabetes mellitus with unspecified diabetic retinopathy without macular edema: Secondary | ICD-10-CM | POA: Insufficient documentation

## 2016-01-01 DIAGNOSIS — Z7984 Long term (current) use of oral hypoglycemic drugs: Secondary | ICD-10-CM | POA: Diagnosis not present

## 2016-01-01 DIAGNOSIS — M79601 Pain in right arm: Secondary | ICD-10-CM | POA: Diagnosis not present

## 2016-01-01 DIAGNOSIS — I110 Hypertensive heart disease with heart failure: Secondary | ICD-10-CM | POA: Insufficient documentation

## 2016-01-01 DIAGNOSIS — Z96652 Presence of left artificial knee joint: Secondary | ICD-10-CM | POA: Diagnosis not present

## 2016-01-01 DIAGNOSIS — I509 Heart failure, unspecified: Secondary | ICD-10-CM | POA: Insufficient documentation

## 2016-01-01 DIAGNOSIS — M79602 Pain in left arm: Secondary | ICD-10-CM | POA: Diagnosis not present

## 2016-01-01 DIAGNOSIS — R410 Disorientation, unspecified: Secondary | ICD-10-CM | POA: Diagnosis not present

## 2016-01-01 DIAGNOSIS — R52 Pain, unspecified: Secondary | ICD-10-CM

## 2016-01-01 HISTORY — DX: Unspecified convulsions: R56.9

## 2016-01-01 LAB — BASIC METABOLIC PANEL
Anion gap: 11 (ref 5–15)
BUN: 10 mg/dL (ref 6–20)
CO2: 29 mmol/L (ref 22–32)
Calcium: 8.8 mg/dL — ABNORMAL LOW (ref 8.9–10.3)
Chloride: 101 mmol/L (ref 101–111)
Creatinine, Ser: 0.61 mg/dL (ref 0.44–1.00)
GFR calc Af Amer: >60 mL/min (ref >60)
GFR calc non Af Amer: >60 mL/min (ref >60)
Glucose, Bld: 133 mg/dL — ABNORMAL HIGH (ref 65–99)
Potassium: 3.2 mmol/L — ABNORMAL LOW (ref 3.5–5.1)
Sodium: 141 mmol/L (ref 135–145)

## 2016-01-01 LAB — CBC
HCT: 30.5 % — ABNORMAL LOW (ref 36.0–46.0)
Hemoglobin: 10 g/dL — ABNORMAL LOW (ref 12.0–15.0)
MCH: 27.5 pg (ref 26.0–34.0)
MCHC: 32.8 g/dL (ref 30.0–36.0)
MCV: 84 fL (ref 78.0–100.0)
Platelets: 230 10*3/uL (ref 150–400)
RBC: 3.63 MIL/uL — ABNORMAL LOW (ref 3.87–5.11)
RDW: 15.9 % — ABNORMAL HIGH (ref 11.5–15.5)
WBC: 5.7 10*3/uL (ref 4.0–10.5)

## 2016-01-01 LAB — I-STAT TROPONIN, ED
Troponin i, poc: 0 ng/mL (ref 0.00–0.08)
Troponin i, poc: 0.01 ng/mL (ref 0.00–0.08)

## 2016-01-01 NOTE — ED Notes (Signed)
Arrived via EMS husband called for patient complaining of right upper extremity pain. EMS reported patient history of dementia en route patient right extremity pain resolved and developed left upper extremity pain since resolved prior to arrival.  Patient did have a bowel movement during transport.  Patient had a seizure 5 months ago and since then been bed ridden.

## 2016-01-01 NOTE — Discharge Instructions (Signed)
You have been seen today for multiple areas of pain. Your imaging and lab tests showed no acute abnormalities. Follow up with PCP as needed. Return to ED should symptoms recur.

## 2016-01-01 NOTE — ED Provider Notes (Signed)
CSN: [id number]     Arrival date & time 01/01/16  1237 History   First MD Initiated Contact with Patient 01/01/16 1355     Chief Complaint  Patient presents with  . Extremity Pain     (Consider location/radiation/quality/duration/timing/severity/associated sxs/prior Treatment) HPI  Low 5 caveat applies due to dementia.  Dana [name] is a 70 y.o. female, with a history of dementia, CHF, seizures, CVA, and hypertension, presenting to the ED with chest pain that began around 11:30 AM today. Pt began to complain of chest pain, then right arm pain, then left arm pain. Pt currently denies pain and patient's husband states patient is usually accurate as to when she is having pain. Husband states this has happened multiple times before with no abnormalities found. Denies vomiting, complaints of abdominal pain, fever, or any other complaints. Patient's husband is at the bedside and provides all of the above information.      Past Medical History  Diagnosis Date  . ALLERGIC RHINITIS 02/08/2007  . ANXIETY 02/08/2007  . DEPRESSION 02/08/2007  . DIABETES MELLITUS, TYPE I 02/08/2007  . Headache(784.0) 11/06/2008  . HYPERCHOLESTEROLEMIA 11/05/2008  . HYPERTENSION 02/08/2007  . URINARY INCONTINENCE 02/08/2007  . Dyslipidemia   . Non-ulcer dyspepsia   . Leukopenia   . DM retinopathy (HCC)     background  . 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 Dementia   . CHF (congestive heart failure) (HCC)   . Anemia     takes iron  . Skin abnormality     both knees with opne ares and left shoulder with open areas, changes dressings prn  . Seizures [facility])    Past Surgical History  Procedure Laterality Date  . Total knee arthroplasty  1990    left  . Panendoscopy  10/27/1999  . Stress cardiolite  12/14/2003  . Electrocardiogram  05/17/2006  . Abdominal hysterectomy    . Bladder tach  years ago  . Colonoscopy with propofol N/A 06/21/2014    Procedure: COLONOSCOPY WITH PROPOFOL;  Surgeon:  Rachael Fee, MD;  Location: [facility] END[facility];  Service: Endoscopy;  Laterality: N/A;   Family History  Problem Relation Age of Onset  . Alcohol abuse Son   . Cancer Neg Hx     no cancer in immediate family  . CAD Father   . Colon cancer Neg Hx   . Colon polyps Neg Hx   . Diabetes Father   . Kidney disease Neg Hx   . Esophageal cancer Neg Hx   . Gallbladder disease Neg Hx    Social History  Substance Use Topics  . Smoking status: Never Smoker   . Smokeless tobacco: Never Used  . Alcohol Use: No   OB History    No data available     Review of Systems  Unable to perform ROS: Dementia  Respiratory: Negative for cough and shortness of breath.   Cardiovascular: Positive for chest pain (resolved).  Gastrointestinal: Negative for vomiting, abdominal pain and diarrhea.  Musculoskeletal: Positive for myalgias (arm pain - resolved).      Allergies  Aspirin and Codeine  Home Medications   Prior to Admission medications   Medication Sig Start Date End Date Taking? Authorizing Provider  aspirin EC 325 MG tablet TAKE 1 TABLET (325 MG TOTAL) BY MOUTH DAILY. 03/04/15  Yes Kathleene Hazel, MD  atorvastatin (LIPITOR) 20 MG tablet TAKE 1 TABLET BY MOUTH EVERY DAY 10/23/15  Yes Romero Belling, MD  carvedilol (  COREG) 3.125 MG tablet TAKE 1 TABLET (3.125 MG TOTAL) BY MOUTH 2 (TWO) TIMES DAILY WITH A MEAL. 10/15/15  Yes Kathleene Hazel, MD  cetirizine (ZYRTEC) 10 MG tablet Take 10 mg by mouth daily as needed for allergies.   Yes Historical Provider, MD  citalopram (CELEXA) 20 MG tablet TAKE 1 TABLET (20 MG TOTAL) BY MOUTH DAILY. 10/29/14  Yes Romero Belling, MD  donepezil (ARICEPT) 10 MG tablet Take 10 mg by mouth daily. 12/19/15  Yes Historical Provider, MD  furosemide (LASIX) 40 MG tablet Take  every other day, alternating with  every other day. 11/14/15  Yes Beatrice Lecher, PA-C  lisinopril (PRINIVIL,ZESTRIL) 2.5 MG tablet Take 1 tablet once daily 11/14/15  Yes Scott T Alben Spittle,  PA-C  loperamide (IMODIUM) 2 MG capsule Take 1 capsule (2 mg total) by mouth as needed for diarrhea or loose stools. 04/11/15  Yes Hollice Espy, MD  mirtazapine (REMERON SOL-TAB) 30 MG disintegrating tablet Take 30 mg by mouth at bedtime.  11/11/15  Yes Historical Provider, MD  NAMENDA XR 28 MG CP24 24 hr capsule Take 28 mg by mouth daily.  08/23/15  Yes Historical Provider, MD  Propylene Glycol (SYSTANE BALANCE OP) Apply 1 drop to eye 3 (three) times daily as needed (dry eyes).   Yes Historical Provider, MD  sitaGLIPtin-metformin (JANUMET) 50-1000 MG tablet Take 1 tablet by mouth 2 (two) times daily with a meal. 08/05/15  Yes Romero Belling, MD  sodium chloride (OCEAN) 0.65 % SOLN nasal spray Place 2 sprays into both nostrils as needed for congestion.   Yes Historical Provider, MD  Valproate Sodium (VALPROIC ACID) 250 MG/5ML SOLN Take 10 mls daily 06/28/15  Yes Van Clines, MD  citalopram (CELEXA) 20 MG tablet TAKE 1 TABLET (20 MG TOTAL) BY MOUTH DAILY. Patient not taking: Reported on 01/01/2016 12/18/15   Romero Belling, MD  hyoscyamine (LEVSIN SL) 0.125 MG SL tablet PLACE 1 TABLET UNDER THE TONGUE TWICE DAILY AS NEEDED FOR CRAMPS AND SPASMS. Patient not taking: Reported on 01/01/2016 04/01/15   Lori P Hvozdovic, PA-C   BP 154/71 mmHg  Pulse 79  Temp(Src) 98 F (36.7 C) (Oral)  Resp 18  Wt 68.04 kg  SpO2 100% Physical Exam  Constitutional: She appears well-developed and well-nourished. No distress.  HENT:  Head: Normocephalic and atraumatic.  Eyes: Conjunctivae are normal.  Neck: Neck supple.  Cardiovascular: Normal rate, regular rhythm, normal heart sounds and intact distal pulses.   Pulmonary/Chest: Effort normal and breath sounds normal. No respiratory distress.  Abdominal: Soft. There is no tenderness. There is no guarding.  Musculoskeletal: She exhibits no edema or tenderness.  ROM intact in all extremities.   Lymphadenopathy:    She has no cervical adenopathy.  Neurological: She is  alert.  Patient is mentating to her normal level, oriented to self only. Grips equal. Strength 5/5. Denies sensory deficits.  Skin: Skin is warm and dry. She is not diaphoretic.  Full skin exam reveals no abnormalities.  Psychiatric: She has a normal mood and affect. Her behavior is normal.  Nursing note and vitals reviewed.   ED Course  Procedures (including critical care time) Labs Review Labs Reviewed  BASIC METABOLIC PANEL - Abnormal; Notable for the following:    Potassium 3.2 (*)    Glucose, Bld 133 (*)    Calcium 8.8 (*)    All other components within normal limits  CBC - Abnormal; Notable for the following:    RBC 3.63 (*)  Hemoglobin 10.0 (*)    HCT 30.5 (*)    RDW 15.9 (*)    All other components within normal limits  I-STAT TROPOININ, ED  Rosezena SensorI-STAT TROPOININ, ED    Imaging Review Dg Chest 2 View  01/01/2016  CLINICAL DATA:  Confusion and weakness. EXAM: CHEST  2 VIEW COMPARISON:  05/22/2015 and 04/07/2015 FINDINGS: The heart size and mediastinal contours are within normal limits. Both lungs are clear. Extensive coronary artery calcification. Aortic atherosclerosis. Diffuse degenerative changes in the spine. Bilateral slight arthritic changes of the shoulders. IMPRESSION: No active cardiopulmonary disease. Aortic and coronary artery atherosclerosis. Electronically Signed   By: Francene BoyersJames  Maxwell M.D.   On: 01/01/2016 13:40   I have personally reviewed and evaluated these images and lab results as part of my medical decision-making.   EKG Interpretation   Date/Time:  Wednesday January 01 2016 12:51:48 EDT Ventricular Rate:  83 PR Interval:  186 QRS Duration: 142 QT Interval:  438 QTC Calculation: 514 R Axis:   -69 Text Interpretation:  Normal sinus rhythm Left axis deviation Left bundle  branch block Abnormal ECG Confirmed by Juleen ChinaKOHUT  MD, STEPHEN ([id number]) on  01/01/2016 1:22:10 PM      MDM   Final diagnoses:  Pain    Minda MeoShirley M Mccrumb presents with previous complaint  of chest and extremity pain that occurred earlier this morning.  Findings and plan of care discussed with Raeford RazorStephen Kohut, MD.   Patient remained pain-free throughout her stay in the ED. Left bundle branch block on EKG is not new and has already been addressed by cardiology. Patient is not a candidate for an implanted device. Low suspicion for ACS. HEART score is 3, indicating low risk for a cardiac event. Patient is nontoxic appearing, afebrile, not tachycardic, not tachypneic, not hypotensive, maintains SPO2 of 99% on room air, and is in no apparent distress. Patient has no signs of sepsis or other serious or life-threatening condition. Delta troponins negative. No acute changes on the patient's labs. Return precautions discussed. Patient follow up with PCP. Patient and patient's husband voice understanding of these instructions, agree to the plan, and are comfortable with discharge.  Filed Vitals:   01/01/16 1415 01/01/16 1430 01/01/16 1445 01/01/16 1500  BP: 131/68 133/73 145/71 137/97  Pulse: 79 69 79 80  Temp:      TempSrc:      Resp:      Weight:      SpO2: 96% 96% 100% 99%   Filed Vitals:   01/01/16 1545 01/01/16 1600 01/01/16 1630 01/01/16 1700  BP: 129/74 128/73 122/66 122/84  Pulse: 86 85 83 86  Temp:      TempSrc:      Resp:      Weight:      SpO2: 98% 100% 99% 99%   Filed Vitals:   01/01/16 1700 01/01/16 1730 01/01/16 1800 01/01/16 1808  BP: 122/84 128/79 136/64   Pulse: 86 87    Temp:    98.6 F (37 C)  TempSrc:    Oral  Resp:      Weight:      SpO2: 99% 98%         Anselm PancoastShawn C Chaquita Basques, PA-C 01/01/16 1839  Raeford RazorStephen Kohut, MD 01/14/16 1116

## 2016-01-01 NOTE — ED Notes (Signed)
Pt had very dirty diaper, cleansed of stool and urine new adult diaper placed on pt, waiting on faimly

## 2016-01-02 DIAGNOSIS — F0390 Unspecified dementia without behavioral disturbance: Secondary | ICD-10-CM | POA: Diagnosis not present

## 2016-01-02 DIAGNOSIS — I10 Essential (primary) hypertension: Secondary | ICD-10-CM | POA: Diagnosis not present

## 2016-01-02 DIAGNOSIS — I6789 Other cerebrovascular disease: Secondary | ICD-10-CM | POA: Diagnosis not present

## 2016-01-02 DIAGNOSIS — I5042 Chronic combined systolic (congestive) and diastolic (congestive) heart failure: Secondary | ICD-10-CM | POA: Diagnosis not present

## 2016-01-02 DIAGNOSIS — G309 Alzheimer's disease, unspecified: Secondary | ICD-10-CM | POA: Diagnosis not present

## 2016-01-09 DIAGNOSIS — G309 Alzheimer's disease, unspecified: Secondary | ICD-10-CM | POA: Diagnosis not present

## 2016-01-09 DIAGNOSIS — I5042 Chronic combined systolic (congestive) and diastolic (congestive) heart failure: Secondary | ICD-10-CM | POA: Diagnosis not present

## 2016-01-09 DIAGNOSIS — I10 Essential (primary) hypertension: Secondary | ICD-10-CM | POA: Diagnosis not present

## 2016-01-09 DIAGNOSIS — I6789 Other cerebrovascular disease: Secondary | ICD-10-CM | POA: Diagnosis not present

## 2016-01-15 ENCOUNTER — Other Ambulatory Visit: Payer: Self-pay | Admitting: *Deleted

## 2016-01-15 NOTE — Patient Outreach (Addendum)
[facility]) Care Management  12/03/2015 Late entry   Dana [name] 12/02/1945 [id number]  RN Health Coach telephone call to patient.  [employer] compliance verified. RN Health Coach spoke with patient husband . Per husband as RN was trying to ask about Diabetes. Husband was saying I am busy right now.  Per husband I just need somebody to help with her care. Per husband palliative care said they can't do anything . I just need some help with her care or something.  Right now I am to busy.  Plan: RN reviewed Dr notes for 12/12/2015. RN called Garner Nashon Hertweck  At [phone] to make sure that patient is not being seen by Palliative care . Roe CoombsDon will call RN Health Coach back once he returns to office.  RN Health Coach is checking to see if patient is in an external program. Before referring to community RN and Child psychotherapistocial worker.  Gean MaidensFrances Kimblery Diop BSN RN [facility] Care Management [phone]

## 2016-01-16 ENCOUNTER — Other Ambulatory Visit: Payer: Self-pay | Admitting: *Deleted

## 2016-01-16 DIAGNOSIS — E1310 Other specified diabetes mellitus with ketoacidosis without coma: Secondary | ICD-10-CM

## 2016-01-16 DIAGNOSIS — G300 Alzheimer's disease with early onset: Secondary | ICD-10-CM

## 2016-01-16 DIAGNOSIS — F0281 Dementia in other diseases classified elsewhere with behavioral disturbance: Secondary | ICD-10-CM

## 2016-01-16 NOTE — Patient Outreach (Signed)
[facility] Care Management     01/15/2016   [name] 11/29/1945 [id number]   [facility]. This patient had been referred in the past to Hospice and was not an appropriate case . The patient had been referred to Palliative and did not meet that criteria. Dr Barbee ShropshireHertweck stated he would let Shawn the FNP know in Palliative care that this patient has been referred again to see if there is any thing additional that has occurred.   Plan: RN Health Coach will [facility] RN RN Health Coach will refer this patient to Social worker  Gean MaidensFrances Erielle Gawronski BSN RN [facility] Care Management [phone]

## 2016-01-17 ENCOUNTER — Encounter: Payer: Self-pay | Admitting: *Deleted

## 2016-01-18 ENCOUNTER — Other Ambulatory Visit: Payer: Self-pay | Admitting: Endocrinology

## 2016-01-20 ENCOUNTER — Other Ambulatory Visit: Payer: Self-pay | Admitting: *Deleted

## 2016-01-20 ENCOUNTER — Telehealth: Payer: Self-pay | Admitting: Endocrinology

## 2016-01-20 DIAGNOSIS — I5021 Acute systolic (congestive) heart failure: Secondary | ICD-10-CM

## 2016-01-20 NOTE — Patient Outreach (Signed)
Referral received from health coach to perform assessment for assistance with diabetes management and bedsores.  Member also has history of Alzheimer's, her husband is her primary caregiver, and he is in need of some assistance managing her care.  Referral has also been placed for social worker involvement.  Call placed to member's husband, [name], member's identity verified.  [facility] explained.  He state that the member is now bed/chair bound and has multiple bedsores.  He expresses some concern regarding assistance caring for the member.  This care manager inquired if he was interested in bringing someone in the home or having the member placed.  He state that he prefer to have someone in the home, stating that placement would be done only if it was the last resort.    He state that he has applied for Medicaid for the member last year, but was denied.  He is made aware of the social worker consult and that the assigned worker will assist with another application.  He is also made aware that in the event the member does not qualify, other services that provide personal care assistance will have an out of pocket fee. He report that he will be unable to pay any out of pocket expenses.    Husband report that the member was [facility], and state that a new order from the PCP should have been placed to manage the bedsores.  He state that he is unsure if the order was placed and if someone has tried to contact him because he has recently had his phone number changed.  Call placed to PCP office to request new order for home health be placed and to provide new number.  He agrees to initial home visit, which is scheduled for within 2 weeks.  Contact information provided, encouraged to contact with concerns/questions.  Kemper DurieMonica Romey Cohea, CaliforniaRN, MSN [facility] Care Management  [facility] Care Manager [phone]

## 2016-01-20 NOTE — Telephone Encounter (Signed)
[facility] and said husband told her there should have been an order for Home Health through [facility] placed, but he got a new number and didn't know if it has been done. Husband new # [phone]

## 2016-01-20 NOTE — Telephone Encounter (Signed)
See below. Referral was last placed in January of 2017. Pt will need a new referral at this time.

## 2016-01-21 NOTE — Telephone Encounter (Signed)
cpx is due We'll address then 

## 2016-01-22 NOTE — Telephone Encounter (Signed)
Pt husband informed Yates Decamp([name] [name]).   Scheduled cpe made for Aug 16th, at 4pm.

## 2016-01-23 DIAGNOSIS — R531 Weakness: Secondary | ICD-10-CM | POA: Diagnosis not present

## 2016-01-27 ENCOUNTER — Other Ambulatory Visit: Payer: Self-pay | Admitting: *Deleted

## 2016-01-27 ENCOUNTER — Encounter: Payer: Self-pay | Admitting: *Deleted

## 2016-01-27 NOTE — Patient Outreach (Signed)
[facility]) Care Management  01/27/2016  Dana [name] Jul 26, 1945 [id number]  CSW received a new referral on patient from patient's RNCM with [place] [facility], Valente David, indicating that patient would benefit from social work services and resources to assist with completion of application for Adult Medicaid, as well as a referral to [employer] for [employer] (PCS).  Mrs. Dana [name] reported that patient has been diagnosed with Alzheimer's/Dementia with Behavioral Disturbance and is bed/chair bound, completely dependent on her husband, Dana [name] to assist with all activities of daily living.  Patient recently presented to the Emergency Department at [facility] for pain management and was noted to have visible bedsores.  Mrs. Dana [name] requested that CSW assess patient's need for long-term care placement into a skilled nursing facility versus home with 24 hour care and supervision. CSW was able to make initial contact with patient's husband, Dana Whicker today to perform phone assessment, as well as assess and assist with social work needs and services.  CSW introduced self, explained role and types of services provided through [place] [facility] ([place] [facility]).  CSW further explained to Mr. Girtman that [facility] works with patient's RNCM, also with [place] [facility], Valente David. CSW then explained the reason for the call, indicating that Mrs. Dana [name] thought that patient would benefit from social work services and resources to assist with arranging home care services and completion of an Adult Medicaid application.  CSW obtained two HIPAA compliant identifiers from [name], which included patient's name and date of birth. Mr. Tsan admitted that it has only been 7 months since he and his wife both applied for Adult Medicaid through the [place].  Patient received a letter from her Adult Medicaid  Case Worker, [place] [phone]); Case #:[id number], indicating that she had been denied, not providing enough outstanding medical expenses to qualify for government assistance at this time.  CSW was able to make contact with Mrs. [facility], who reports that patient needs to continue to submit all her outstanding medical expenses from July 08, 2015 to the present day.  Mr. Ibrahim was not aware of this, but indicated that he has kept all bills, statements, receipts, explanation of benefits, etc. and will get everything submitted to Mrs. Covington this week.  Mrs. Covington reported that patient is still several thousand dollars away from meeting the out-of-pocket expense requirement, estimating that it will take several more months for patient to satisfy the required amount. CSW provided Mr. Jacqualin Combes with CSW's contact information, encouraging Mr. Mincy to contact CSW directly as soon as the out-of-pocket expense has been met so that CSW can assist patient and Mr. Jacqualin Combes with completion of an application for [employer] Youth worker) through [employer].  Mr. Edelson voiced understanding and was agreeable to this plan, admitting that he is unable to pay for home care services out of his own pocket.  Mr. Labella is not agreeable to placement, of any kind, for patient at this time.   CSW will perform a case closure on patient, as all goals of treatment have been met from social work standpoint and no additional social work needs have been identified at this time.  CSW will notify patient's RNCM with [place] [facility], Valente David of CSW's plans to close patient's case.  CSW will fax an update to patient's Primary Care Physician, Dr. Renato Shin to ensure that they are aware of CSW's involvement  with patient's plan of care.  CSW will submit a case closure request to Verlon Setting, Care Management Assistant with [place] [facility], in the form of an [employer].  CSW will ensure that Mrs. Comer is aware of Roma Schanz, RNCM with [place] [facility], continued involvement with patient's care.  Nat Christen, BSW, MSW, LCSW  Licensed Education officer, [facility]  Mailing [place] [address], [place], [place] [address] Physical Address-[address], [place], [place] [address] Toll Free Main # [phone] Fax # [phone] Cell # [phone]  Fax # [phone]  [email or web address]

## 2016-02-03 ENCOUNTER — Other Ambulatory Visit: Payer: Self-pay | Admitting: Endocrinology

## 2016-02-03 ENCOUNTER — Other Ambulatory Visit: Payer: Self-pay | Admitting: *Deleted

## 2016-02-03 ENCOUNTER — Encounter: Payer: Self-pay | Admitting: *Deleted

## 2016-02-03 VITALS — BP 132/80 | HR 75 | Resp 18 | Ht 63.0 in

## 2016-02-03 DIAGNOSIS — E1169 Type 2 diabetes mellitus with other specified complication: Secondary | ICD-10-CM

## 2016-02-03 DIAGNOSIS — F0390 Unspecified dementia without behavioral disturbance: Secondary | ICD-10-CM

## 2016-02-03 DIAGNOSIS — I10 Essential (primary) hypertension: Secondary | ICD-10-CM

## 2016-02-03 NOTE — Patient Outreach (Signed)
[facility](THN) Care Management   02/03/2016  Minda MeoShirley M Crass 05/02/1946 [id number]  Minda MeoShirley M Beadnell is an 70 y.o. female  Subjective:   Member resting in hospital bed, alert and oriented, denies pain or discomfort at this time.  Objective:   Review of Systems  Constitutional: Negative.   HENT: Negative.   Eyes: Negative.   Respiratory: Negative.   Cardiovascular: Negative.   Gastrointestinal: Negative.   Genitourinary: Negative.   Musculoskeletal: Negative.   Skin: Negative.   Neurological: Negative.   Endo/Heme/Allergies: Negative.   Psychiatric/Behavioral: Negative.     Physical Exam  Constitutional: She is oriented to person, place, and time. She appears well-nourished.  Neck: Normal range of motion.  Cardiovascular: Normal rate, regular rhythm and normal heart sounds.   Respiratory: Effort normal and breath sounds normal.  GI: Soft. Bowel sounds are normal.  Musculoskeletal: Normal range of motion.  Neurological: She is alert and oriented to person, place, and time.  Skin: Skin is warm and dry.    BP 132/80 mmHg  Pulse 75  Resp 18  Ht 1.6 m (5\' 3" )  Wt   SpO2 98%   Encounter Medications:   Outpatient Encounter Prescriptions as of 02/03/2016  Medication Sig Note  . aspirin EC 325 MG tablet TAKE 1 TABLET (325 MG TOTAL) BY MOUTH DAILY.   Marland Kitchen. atorvastatin (LIPITOR) 20 MG tablet TAKE 1 TABLET BY MOUTH EVERY DAY   . carvedilol (COREG) 3.125 MG tablet TAKE 1 TABLET (3.125 MG TOTAL) BY MOUTH 2 (TWO) TIMES DAILY WITH A MEAL.   . citalopram (CELEXA) 20 MG tablet TAKE 1 TABLET (20 MG TOTAL) BY MOUTH DAILY.   Marland Kitchen. donepezil (ARICEPT) 10 MG tablet Take 10 mg by mouth daily. 01/01/2016: Received from: External Pharmacy  . furosemide (LASIX) 40 MG tablet Take 40mg  every other day, alternating with 20mg  every other day.   Marland Kitchen. JANUMET 50-1000 MG tablet TAKE 1 TABLET BY MOUTH 2 (TWO) TIMES DAILY WITH A MEAL.   Marland Kitchen. lisinopril (PRINIVIL,ZESTRIL) 2.5 MG tablet Take 1 tablet  once daily   . mirtazapine (REMERON SOL-TAB) 30 MG disintegrating tablet Take 30 mg by mouth at bedtime.  11/14/2015: Received from: External Pharmacy  . NAMENDA XR 28 MG CP24 24 hr capsule Take 28 mg by mouth daily.  08/30/2015: Received from: External Pharmacy  . Propylene Glycol (SYSTANE BALANCE OP) Apply 1 drop to eye 3 (three) times daily as needed (dry eyes).   . sodium chloride (OCEAN) 0.65 % SOLN nasal spray Place 2 sprays into both nostrils as needed for congestion.   . Valproate Sodium (VALPROIC ACID) 250 MG/5ML SOLN Take 10 mls daily 06/28/2015: Ordered 06/28/15  . cetirizine (ZYRTEC) 10 MG tablet Take 10 mg by mouth daily as needed for allergies.   . citalopram (CELEXA) 20 MG tablet TAKE 1 TABLET (20 MG TOTAL) BY MOUTH DAILY. (Patient not taking: Reported on 01/01/2016) 02/03/2016: Duplicate  . citalopram (CELEXA) 20 MG tablet TAKE 1 TABLET (20 MG TOTAL) BY MOUTH DAILY. (Patient not taking: Reported on 02/03/2016) 02/03/2016: Duplicate  . hyoscyamine (LEVSIN SL) 0.125 MG SL tablet PLACE 1 TABLET UNDER THE TONGUE TWICE DAILY AS NEEDED FOR CRAMPS AND SPASMS. (Patient not taking: Reported on 01/01/2016)   . loperamide (IMODIUM) 2 MG capsule Take 1 capsule (2 mg total) by mouth as needed for diarrhea or loose stools. (Patient not taking: Reported on 02/03/2016)    No facility-administered encounter medications on file as of 02/03/2016.    Functional Status:  In your present state of health, do you have any difficulty performing the following activities: 01/27/2016 11/05/2015  Hearing? Y N  Vision? Y Y  Difficulty concentrating or making decisions? Malvin Johns  Walking or climbing stairs? Y Y  Dressing or bathing? Y Y  Doing errands, shopping? Malvin Johns  Preparing Food and eating ? Y Y  Using the Toilet? Y Y  In the past six months, have you accidently leaked urine? Y Y  Do you have problems with loss of bowel control? Y Y  Managing your Medications? Y Y  Managing your Finances? Malvin Johns  Housekeeping or  managing your Housekeeping? Malvin Johns    Fall/Depression Screening:    PHQ 2/9 Scores 02/03/2016 01/27/2016 11/05/2015 04/29/2015  PHQ - 2 Score 0 - - 0  Exception Documentation - Medical reason Other- indicate reason in comment box -    Fall Risk  01/27/2016 11/05/2015 04/29/2015  Falls in the past year? Yes No Yes  Number falls in past yr: 2 or more - 1  Injury with Fall? Yes - No  Risk Factor Category  High Fall Risk - -  Risk for fall due to : History of fall(s);Impaired balance/gait;Impaired mobility;Impaired vision;Medication side effect;Mental status change Impaired mobility;Impaired balance/gait;Mental status change History of fall(s);Impaired mobility;Impaired vision  Risk for fall due to (comments): - bedridden/alzheimers -  Follow up Education provided;Falls prevention discussed - Education provided;Falls prevention discussed    Assessment:    Arrived at [facility] home at scheduled time.  Husband present and provides information for assessment, minimal input from member.  Husband has been managing member's care for years and is now in need for assistance due to decline in his health status.  Member is now bedbound and he has to lift her in/out of bed (has hoyer lift).  He also has to lift her in/out of the wheelchair (to/from the car) with each office visit.  Discussed the option to having a physician visit the home (such as Physicians making housecalls) instead him having to lift member multiple times a day.  He state that that would be beneficial, but is hesitant about a new PCP.  He states he will discuss with Dr. Everardo All to get his opinion.  He again state that his only concern is having personal care assistance in the home to help care for the member and provide him with times to rest.  He state that they have 2 children, both of which only visit 1-2 times/week. He is aware that she does not qualify for Medicaid at this time, but he continues to gather information to show out of pocket  expenses hoping to soon qualify.  He states that he is unable to afford to pay someone out of pocket as this is his only option at this time (he is unwilling to have member placed).  He manages her chronic conditions well (blood pressure controlled, A1C 6.7, blood sugar today 136) and reports that he provide the member with her medications as prescribed.  He denies needing any assistance with management of diseases.  Medications reviewed, husband state that he pays nearly $400 for member's medications.  He state that this is every month, however medication bottles show refills for every 3 months.  He is requesting some assistance with lowering the cost.  Discussed the option of using [employer] mail order pharmacy, he is hesitant, stating that he is unsure if he want to go that route.  Benefits discussed, using savings to pay for personal care  expenses and not having to travel to pharmacy to obtain meds, encouraged to consider.  He is aware that pharmacy referral will be placed.  [facility] connections also discussed with husband, he is open to a referral and learning more information.  He denies other concerns at this time, encouraged to contact with concerns.  Plan:   Will place referral to pharmacy for pharmacy assistance. Will place referral to [facility]. Will follow up in 2 weeks.  Kemper Durie, [place], MSN [facility] Care Management  [facility] Manager [phone]

## 2016-02-04 ENCOUNTER — Other Ambulatory Visit: Payer: Self-pay | Admitting: Pharmacist

## 2016-02-04 NOTE — Patient Outreach (Addendum)
[facility](THN) Care Management  02/04/2016  Dana [name] 11/21/1945 [id number]  [facility] received referral from [place], [name] Coord[facility] to evaluate patient for medication patient assistance.    Placed call to patient and spouse identified himself (on [facility] Consent Form), and states that he was sleeping and would  like a call back at a later time.   Plan:  Will attempt to reach patient/caregiver via phone within the next week.   Tommye StandardKevin Nicklaus Alviar, PharmD, [facility] Clinical Pharmacist [facility] [phone]  02/04/16 1525  Placed a return call to patient's caregiver who stated it was a bad time again.  Offered to call back at a later time/date and caregiver stated tomorrow morning would be better.  Will attempt to reach caregiver again over the next week.

## 2016-02-05 ENCOUNTER — Other Ambulatory Visit: Payer: Self-pay | Admitting: Pharmacist

## 2016-02-05 NOTE — Patient Outreach (Signed)
[facility](THN) Care Management  02/05/2016  Dana [name] 09/17/1945 [id number]  [facility] received a referral from [facility] RN [facility] Coord[facility], [place]isk[place]Monica L, that patient's spouse/caregiver is interested in ways to decrease medication cost.    Call placed to patient's caregiver/spouse who verified HIPAA details of patient and is on [facility] consent form.    Explained purpose of call to caregiver.  Spouse/caregiver states that one of the medications that is costly is Janumet.  Discussed [employer]product [employer]ck Manufacturer Patient Assistance Program with spouse/caregiver and he is interested in completing application.   He prefers a home visit to review all medications and complete [employer] patient assistance application.   Plan:  Home visit scheduled with spouse/caregiver to review medications and complete manufacturer patient assistance application.   Tommye StandardKevin Maryfer Tauzin, PharmD, [facility] MonroeBCACP Clinical Pharmacist [facility] [phone]

## 2016-02-06 ENCOUNTER — Telehealth: Payer: Self-pay | Admitting: Endocrinology

## 2016-02-06 NOTE — Telephone Encounter (Signed)
Pt needs orders for palliative care called care connections faxed to [phone]

## 2016-02-06 NOTE — Telephone Encounter (Signed)
Referral order from epic printed and faxed to [facility].

## 2016-02-10 ENCOUNTER — Other Ambulatory Visit: Payer: Self-pay | Admitting: Pharmacist

## 2016-02-11 ENCOUNTER — Encounter: Payer: Self-pay | Admitting: Pharmacist

## 2016-02-11 NOTE — Patient Outreach (Signed)
[facility]) Care Management  [place]  [facility]   02/11/2016  CARRYE MAJESKI [birth year] [id number]  Subjective:  Mrs Hagin is a 70 year old female who was referred to [facility] CM Pharmacy for evaluation of medication patient assistance options by Ander Purpura, [facility] RN [facility].   Home visit was made on 02/10/16 and patient's spouse, [name], provided HIPAA verification details as well as information during visit as patient is bed bound and has dementia.  Spouse is on [facility] consent form.   Patient's spouse reports his main concern is medication cost.  Patient's medications were reviewed based on bottles spouse had.   [facility] RN [employer] Coordinator had spoken with patient's spouse about the potential cost savings with mail order pharmacy.    Spouse states that he is patient's caregiver and gets patient her medications.    He states that he believes their household income may exceed requirements for SSA Extra Help.   Objective:   Encounter Medications: Outpatient Encounter Prescriptions as of 02/10/2016  Medication Sig Note  . aspirin EC 325 MG tablet TAKE 1 TABLET (325 MG TOTAL) BY MOUTH DAILY.   Marland Kitchen atorvastatin (LIPITOR) 20 MG tablet TAKE 1 TABLET BY MOUTH EVERY DAY   . carvedilol (COREG) 3.125 MG tablet TAKE 1 TABLET (3.125 MG TOTAL) BY MOUTH 2 (TWO) TIMES DAILY WITH A MEAL.   . cetirizine (ZYRTEC) 10 MG tablet Take 10 mg by mouth daily as needed for allergies.   . citalopram (CELEXA) 20 MG tablet TAKE 1 TABLET (20 MG TOTAL) BY MOUTH DAILY.   . citalopram (CELEXA) 20 MG tablet TAKE 1 TABLET (20 MG TOTAL) BY MOUTH DAILY. (Patient not taking: Reported on 01/01/2016) 02/03/2016: Duplicate  . citalopram (CELEXA) 20 MG tablet TAKE 1 TABLET (20 MG TOTAL) BY MOUTH DAILY. (Patient not taking: Reported on 02/03/2016) 02/03/2016: Duplicate  . donepezil (ARICEPT) 10 MG tablet Take 10 mg by mouth daily. 01/01/2016: Received from: External Pharmacy  . furosemide (LASIX) 40 MG  tablet Take 40mg  every other day, alternating with 20mg  every other day.   . hyoscyamine (LEVSIN SL) 0.125 MG SL tablet PLACE 1 TABLET UNDER THE TONGUE TWICE DAILY AS NEEDED FOR CRAMPS AND SPASMS. (Patient not taking: Reported on 01/01/2016)   . JANUMET 50-1000 MG tablet TAKE 1 TABLET BY MOUTH 2 (TWO) TIMES DAILY WITH A MEAL.   Marland Kitchen lisinopril (PRINIVIL,ZESTRIL) 2.5 MG tablet Take 1 tablet once daily   . loperamide (IMODIUM) 2 MG capsule Take 1 capsule (2 mg total) by mouth as needed for diarrhea or loose stools. (Patient not taking: Reported on 02/03/2016)   . mirtazapine (REMERON SOL-TAB) 30 MG disintegrating tablet Take 30 mg by mouth at bedtime.  11/14/2015: Received from: External Pharmacy  . NAMENDA XR 28 MG CP24 24 hr capsule Take 28 mg by mouth daily.  08/30/2015: Received from: External Pharmacy  . Propylene Glycol (SYSTANE BALANCE OP) Apply 1 drop to eye 3 (three) times daily as needed (dry eyes).   . sodium chloride (OCEAN) 0.65 % SOLN nasal spray Place 2 sprays into both nostrils as needed for congestion.   . Valproate Sodium (VALPROIC ACID) 250 MG/5ML SOLN Take 10 mls daily 06/28/2015: Ordered 06/28/15   No facility-administered encounter medications on file as of 02/10/2016.     Functional Status: In your present state of health, do you have any difficulty performing the following activities: 01/27/2016 11/05/2015  Hearing? Y N  Vision? Y Y  Difficulty concentrating or making decisions? Malvin Johns  Walking or climbing stairs? Y Y  Dressing or bathing? Y Y  Doing errands, shopping? Malvin Johns  Preparing Food and eating ? Y Y  Using the Toilet? Y Y  In the past six months, have you accidently leaked urine? Y Y  Do you have problems with loss of bowel control? Y Y  Managing your Medications? Y Y  Managing your Finances? Malvin Johns  Housekeeping or managing your Housekeeping? Malvin Johns  Some recent data might be hidden    Fall/Depression Screening: PHQ 2/9 Scores 02/03/2016 01/27/2016 11/05/2015 04/29/2015  PHQ -  2 Score 0 - - 0  Exception Documentation - Medical reason Other- indicate reason in comment box -    Assessment:   Drugs sorted by system:  Neurologic/Psychologic: -citalopram  -donepezil (prescribed by Dr Donell Beers)  -memantine (Namenda XR---prescribed by Dr Donell Beers)  -mirtazapine solu-tabs (prescribed by Dr Donell Beers)  -valproate sodium solution   Cardiovascular: -aspirin 325 mg  -atorvastatin  -carvedilol -furosemide  -lisinopril   Endocrine: -sitagliptin/metformin (Janumet)   Miscellaneous:  -loratadine -loperamide prn diarrhea  Medication assistance:  Based on prescription receipts spouse/caregiver showed [facility] Pharmacist, it appears that patient may be in the Medicare Part D coverage gap, which is making Janumet and Namenda XR cost significantly more.   Spouse was counseled on the Medicare Part D coverage gap.    Discussed SSA Extra Help---spouse believes they are over income requirements---encouraged him to apply anyways and he prefers to do it via phone versus online, as a denial letter may be required in order to try to apply to Namenda XR manufacturer patient assistance program.    Discussed [employer] patient assistance for Janumet---placed call to [employer] Patient Assistance Program to see what documentation is necessary for spouse/caregiver to sign application on behalf of patient given patient's dementia---[employer]ck patient assistance program reports a copy of the POA is necessary with the application.    Spouse/caregiver reports [facility] may be seeing patient.    Plan:  Will route this note and barriers letter to patient's PCP.   Spouse/caregiver has Energy manager and states he will take with to PCP appointment next month.    Will follow-up with spouse/caregiver in about 1 week after [facility] sees patient.    [name], PharmD, [facility] Clinical Pharmacist [facility] [phone]

## 2016-02-13 ENCOUNTER — Telehealth: Payer: Self-pay | Admitting: Endocrinology

## 2016-02-13 NOTE — Telephone Encounter (Signed)
OK TO GIVE VERBAL ORDER  

## 2016-02-13 NOTE — Telephone Encounter (Signed)
ok 

## 2016-02-13 NOTE — Telephone Encounter (Signed)
Drenda Freeze calling from stated Hospice need verbal for hospice services [phone]

## 2016-02-14 NOTE — Telephone Encounter (Signed)
I prefer hospice dr

## 2016-02-14 NOTE — Telephone Encounter (Signed)
They would like to know if you would like to be "attending" or the hospice provider?

## 2016-02-17 ENCOUNTER — Encounter: Payer: Self-pay | Admitting: *Deleted

## 2016-02-17 ENCOUNTER — Other Ambulatory Visit: Payer: Self-pay | Admitting: *Deleted

## 2016-02-17 NOTE — Patient Outreach (Addendum)
Voice message received from Tammy with [facility] regarding their initial home visit with member.  She states that the member is not a candidate for [facility], but would be a candidate for hospice.  Noted in chart that order sent for hospice.  Call placed to member's caregiver/husband to follow up on visit with [facility] and member's current status.  He confirms that the member will now be working with [facility] [place].  He reports that there will be a nurse to visit tomorrow to perform an official assessment for the program.  He is made aware that due to the above, involvement with [facility] will now be complete.  He expresses gratitude for the assistance and verbalizes understanding.  Will notify care management assistant and PCP of case closure.  Kemper Durie, [place], MSN [facility] Care Management  [facility] Manager [phone]

## 2016-02-17 NOTE — Telephone Encounter (Signed)
I contacted Joann RN with Hospice and advised Dr. Everardo All requests for hospice to be the attending provider. She voiced understanding and updated the pt's chart.

## 2016-02-18 ENCOUNTER — Other Ambulatory Visit: Payer: Self-pay | Admitting: Pharmacist

## 2016-02-18 NOTE — Telephone Encounter (Signed)
Hospice went out to patient home Patient did elect hospice care, FYI

## 2016-02-18 NOTE — Patient Outreach (Signed)
[facility]) Care Management  02/18/2016  Dana [name] Dec 24, 1945 [id number]  Received an in basket message from [facility] RN [facility] Manager, Ander Purpura, that patient is being evaluated by [facility].    Placed call to spouse/caregiver on [facility] consent form who verified HIPAA details.   He states that [facility] is supposed to come out to evaluate patient today at some point.   Will attempt to follow-up with [facility] regarding medication coverage as that is what [facility] CM Pharmacy referral was for.    [name], PharmD, [facility] Clinical Pharmacist [facility] [phone]

## 2016-02-18 NOTE — Telephone Encounter (Signed)
See below to be advised.  

## 2016-02-21 ENCOUNTER — Other Ambulatory Visit: Payer: Self-pay | Admitting: Pharmacist

## 2016-02-21 NOTE — Patient Outreach (Signed)
[facility]) Care Management  02/21/2016  Dana [name] Mar 30, 1946 [id number]  Phone call follow-up with patient's spouse/caregiver today regarding patient's medications and hospice.   Spouse/caregiver verified HIPAA details.  He states that most of patient's medications are being covered by hospice and that it is helping greatly with medication cost.   He states today that he believes [facility] [place] is helping them.  He states that he thinks hospice will become patient's doctor now.    Plan:  Spouse/caregiver has [facility] CM Pharmacy contact info.   Will close pharmacy case due to hospice involvement for patient.   [name], PharmD, [facility] Clinical Pharmacist [facility] [phone]

## 2016-03-04 ENCOUNTER — Encounter: Payer: Self-pay | Admitting: Endocrinology

## 2016-04-22 ENCOUNTER — Ambulatory Visit: Payer: Self-pay | Admitting: Neurology

## 2016-04-24 ENCOUNTER — Ambulatory Visit: Payer: Self-pay | Admitting: Neurology

## 2016-08-07 IMAGING — DX DG CHEST 2V
2 series · 2 of 2 positions shown · non-contrast
Comparison: 12/15/2013.

CLINICAL DATA: Diarrhea. Dark stools. Dizziness and weakness.
Initial encounter.

EXAM:
CHEST  2 VIEW

[chest lat]
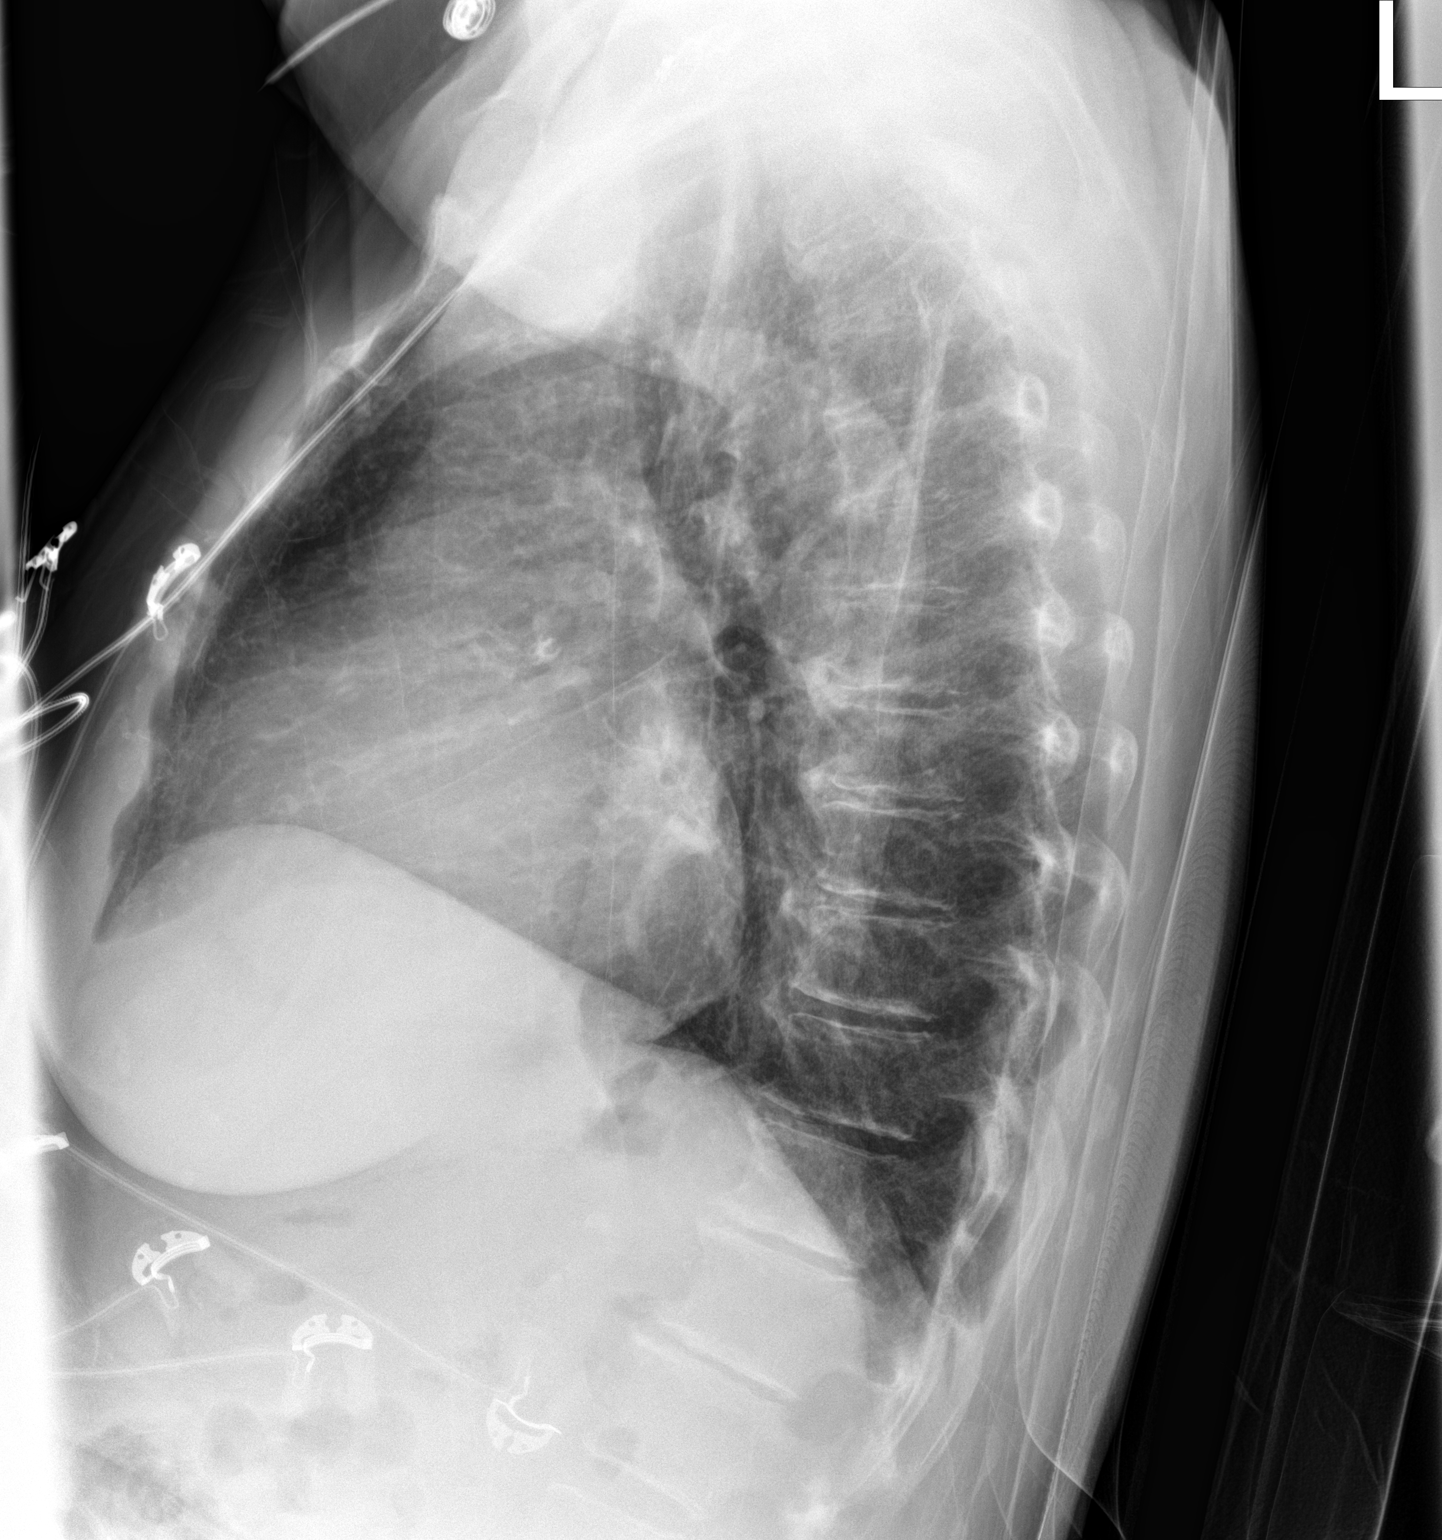

[chest ap]
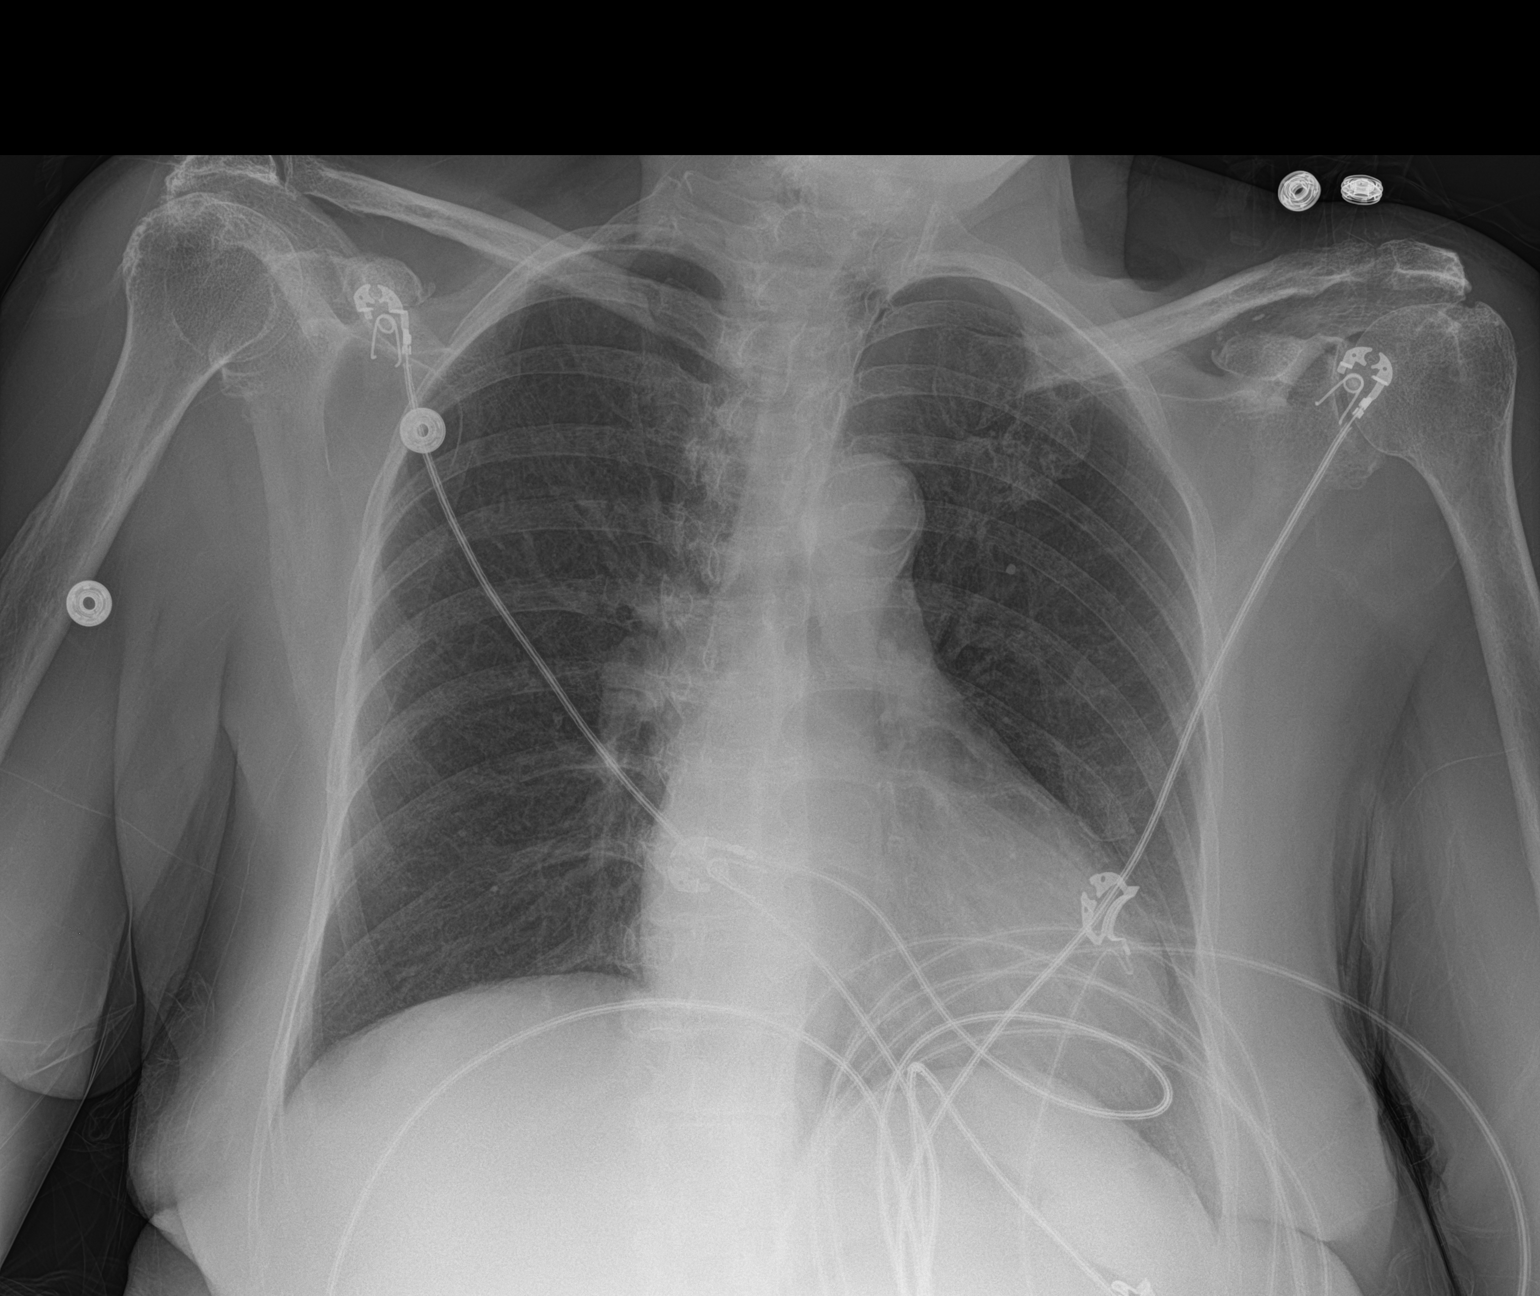

[2 of 2 positions shown; findings below may reference images not displayed]

FINDINGS: Mild cardiomegaly is present. There is LEFT atrial enlargement
evident on the lateral view. Aortic arch atherosclerosis. Monitoring
leads project over the chest. The lungs appear clear. No airspace
disease or effusion.
IMPRESSION: Cardiomegaly without failure.

## 2016-10-13 ENCOUNTER — Telehealth: Payer: Self-pay | Admitting: Endocrinology

## 2016-10-13 NOTE — Telephone Encounter (Signed)
See message and please advise on how to proceed. Thank!

## 2016-10-13 NOTE — Telephone Encounter (Signed)
I contacted the patient's son and advised of message. He voiced understanding.

## 2016-10-13 NOTE — Telephone Encounter (Signed)
Attempted to reach the patient's son, but he was unavailable and voicemail was full. Will try again at a later time.

## 2016-10-13 NOTE — Telephone Encounter (Signed)
best I can think of is to ask [place] social [place]

## 2016-10-13 NOTE — Telephone Encounter (Signed)
Patient's son Esaw GrandchildWilliam Riva (also Ellison's patient) (not on DPR) called to advise that mother is in hospice however, needs to come in for an appointment once d/c from hospice. Son is needing advice on how to get mother to appointments due to transportation. Chrissie NoaWilliam is asking for a call back to advise however, he is not on DPR.   Please advise appropriately.

## 2016-10-13 NOTE — Telephone Encounter (Signed)
Patient son is calling, he ask you to give him a call.

## 2017-04-24 IMAGING — CR DG CHEST 1V PORT
1 series · 1 of 1 positions shown · non-contrast
Comparison: 06/11/2014 and earlier.

CLINICAL DATA: 69-year-old female with weakness, hypothermia, found
down, vomiting, diarrhea, diaphoresis. Initial encounter.

EXAM:
PORTABLE CHEST - 1 VIEW

[AP]
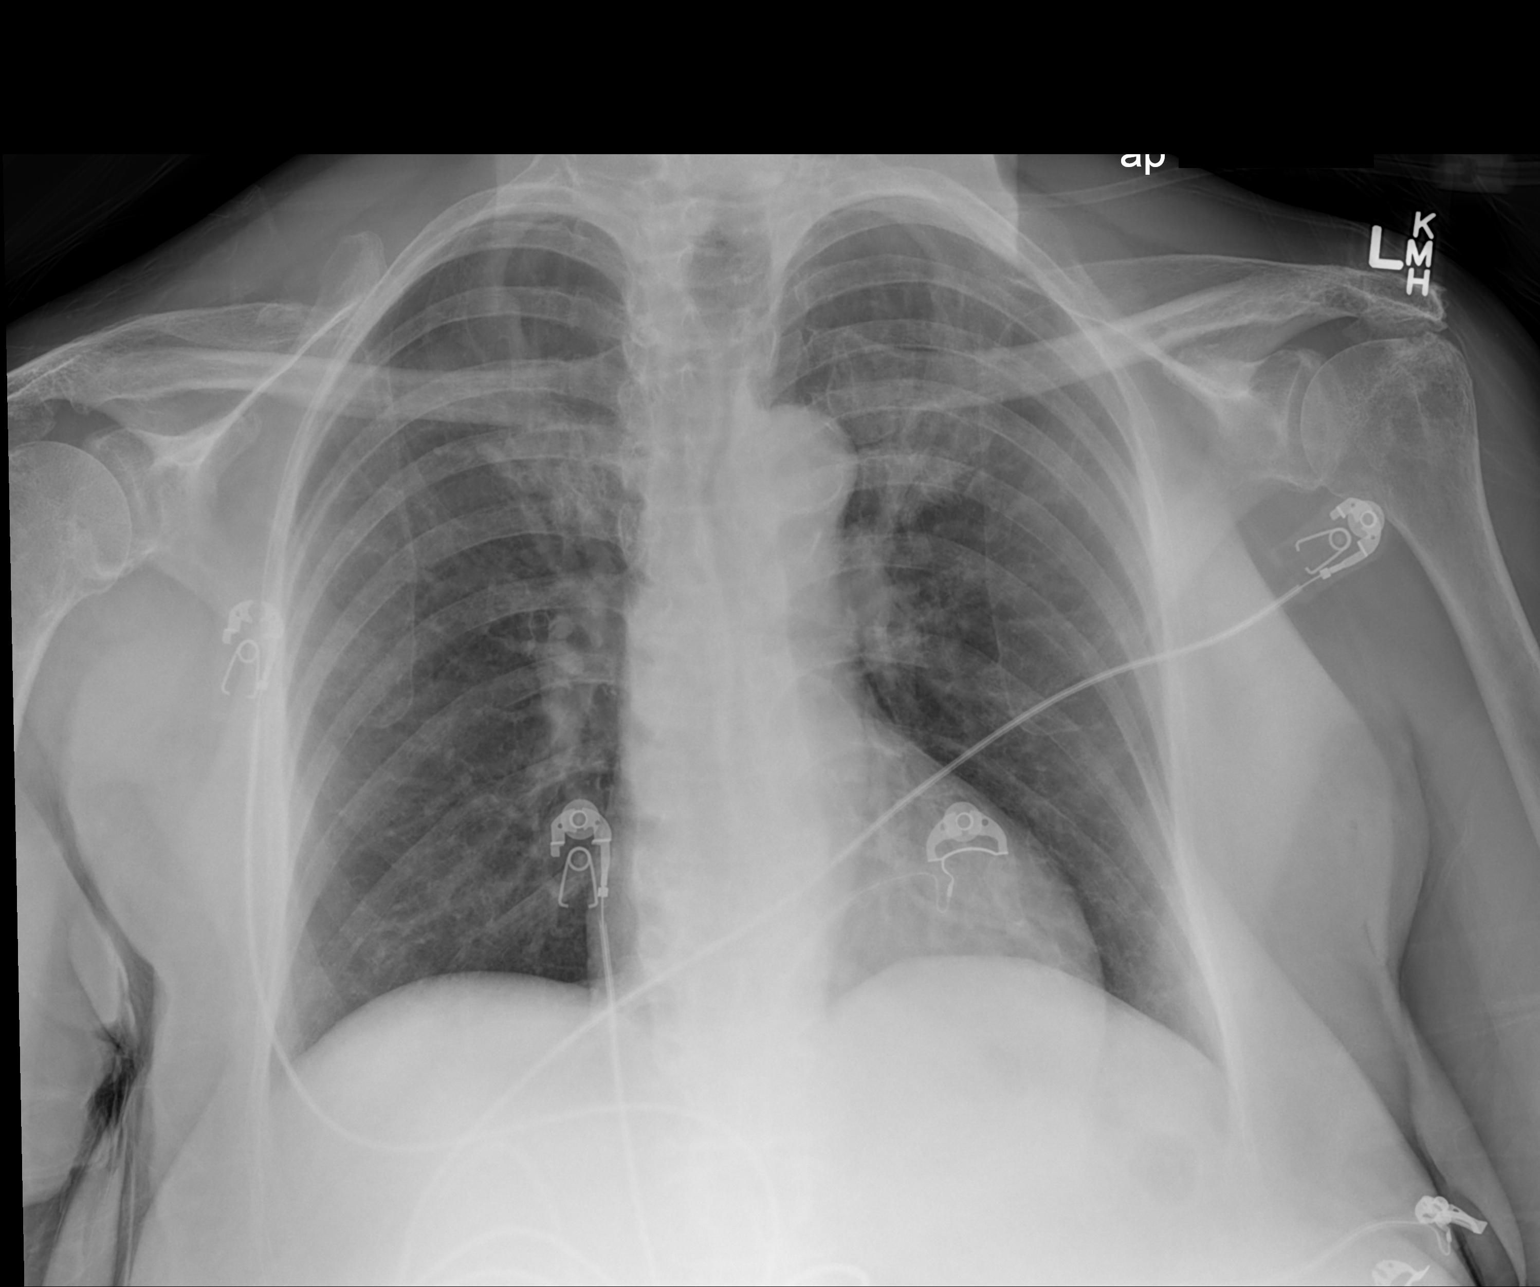

[1 of 1 positions shown; findings below may reference images not displayed]

FINDINGS: Portable AP semi upright view at 0417 hours. Normal cardiac size and
mediastinal contours. Calcified atherosclerosis of the aorta. No
pneumothorax or pleural effusion. Allowing for portable technique,
the lungs are clear. No acute osseous abnormality identified.
IMPRESSION: No acute cardiopulmonary abnormality.

## 2017-06-03 IMAGING — CR DG CHEST 2V
2 series · 2 of 2 positions shown · non-contrast
Comparison: 02/26/2015

CLINICAL DATA: Diarrhea beginning yesterday, fever, shortness of
breath, CHF, hypertension, type I diabetes mellitus

EXAM:
CHEST  2 VIEW

[w chest lat]
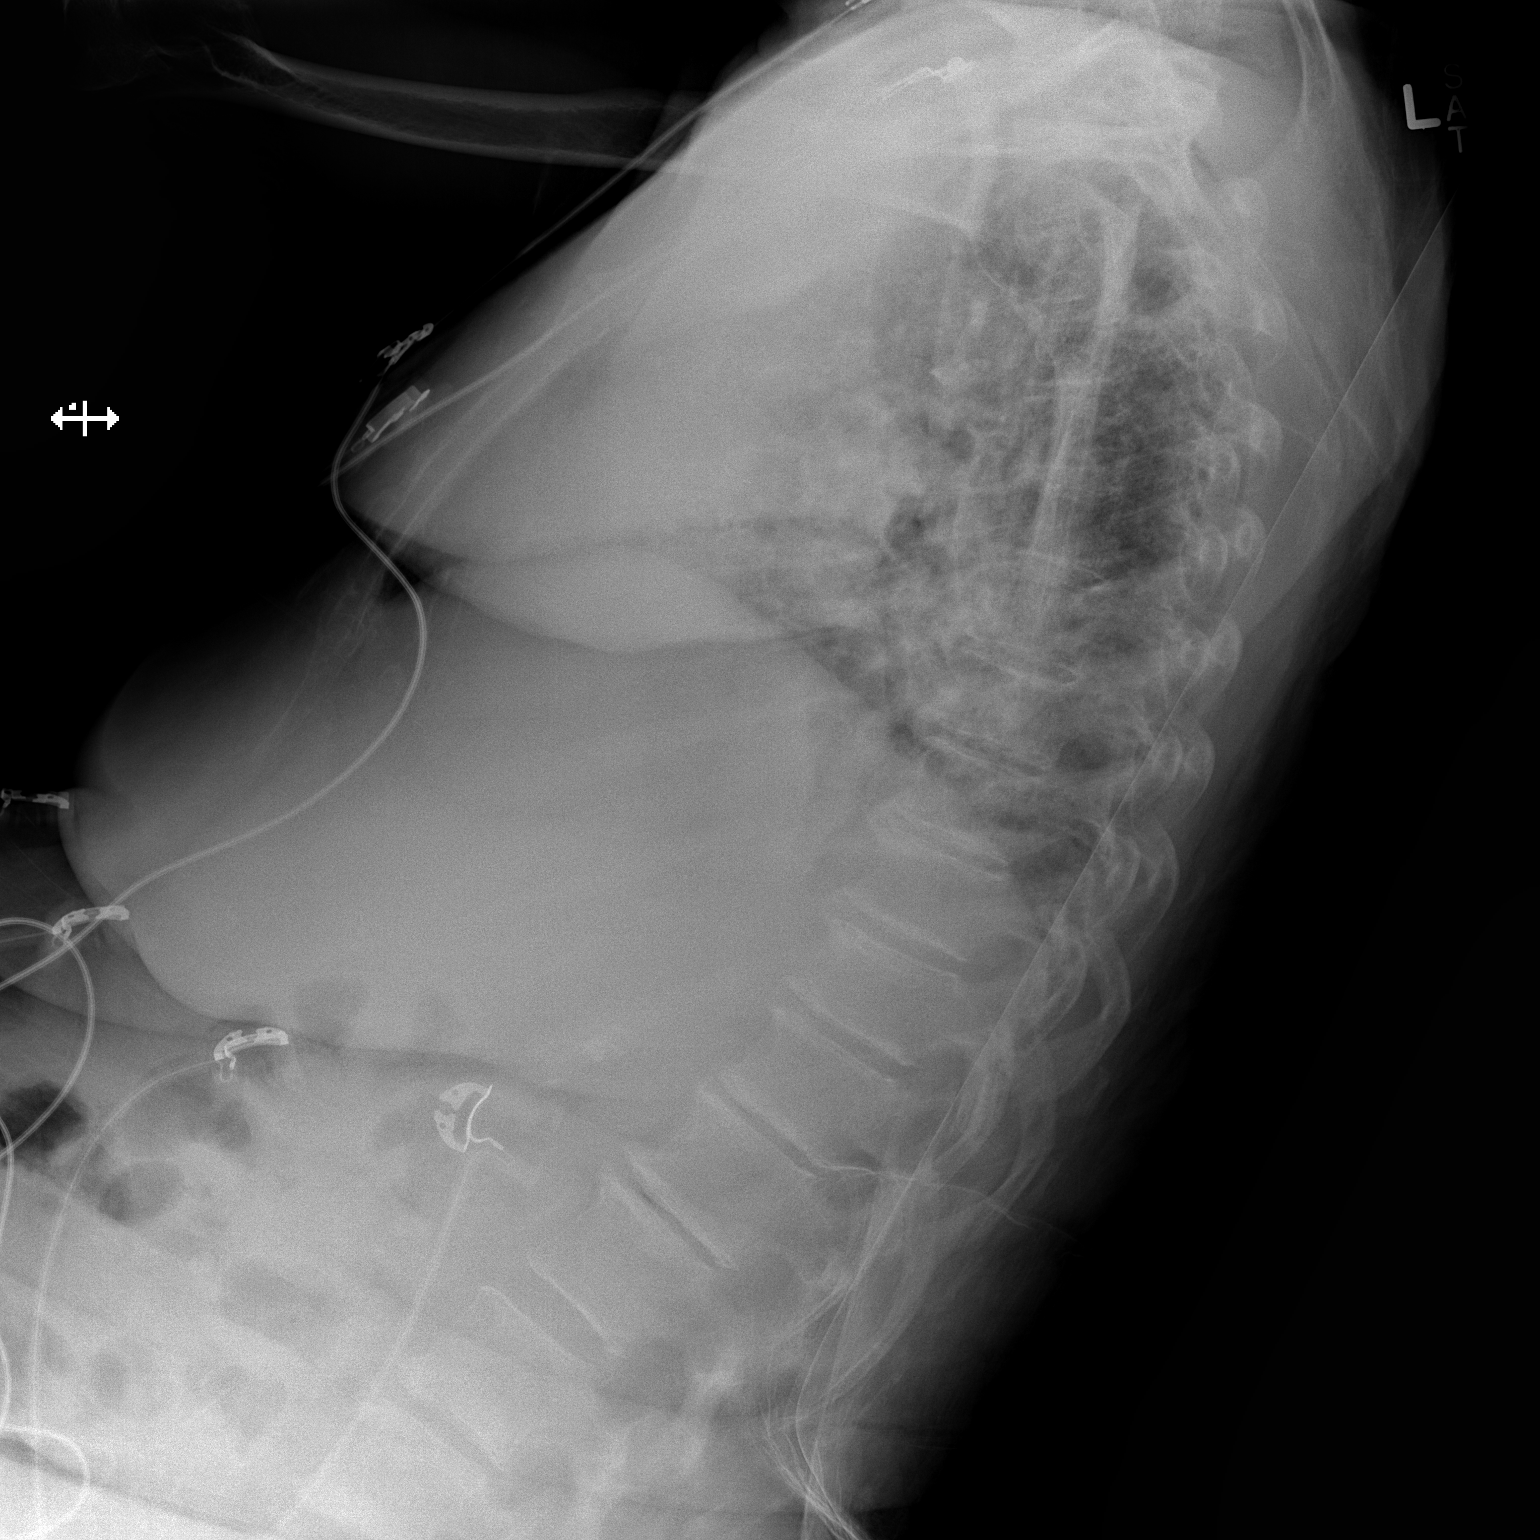

[x chest ap]
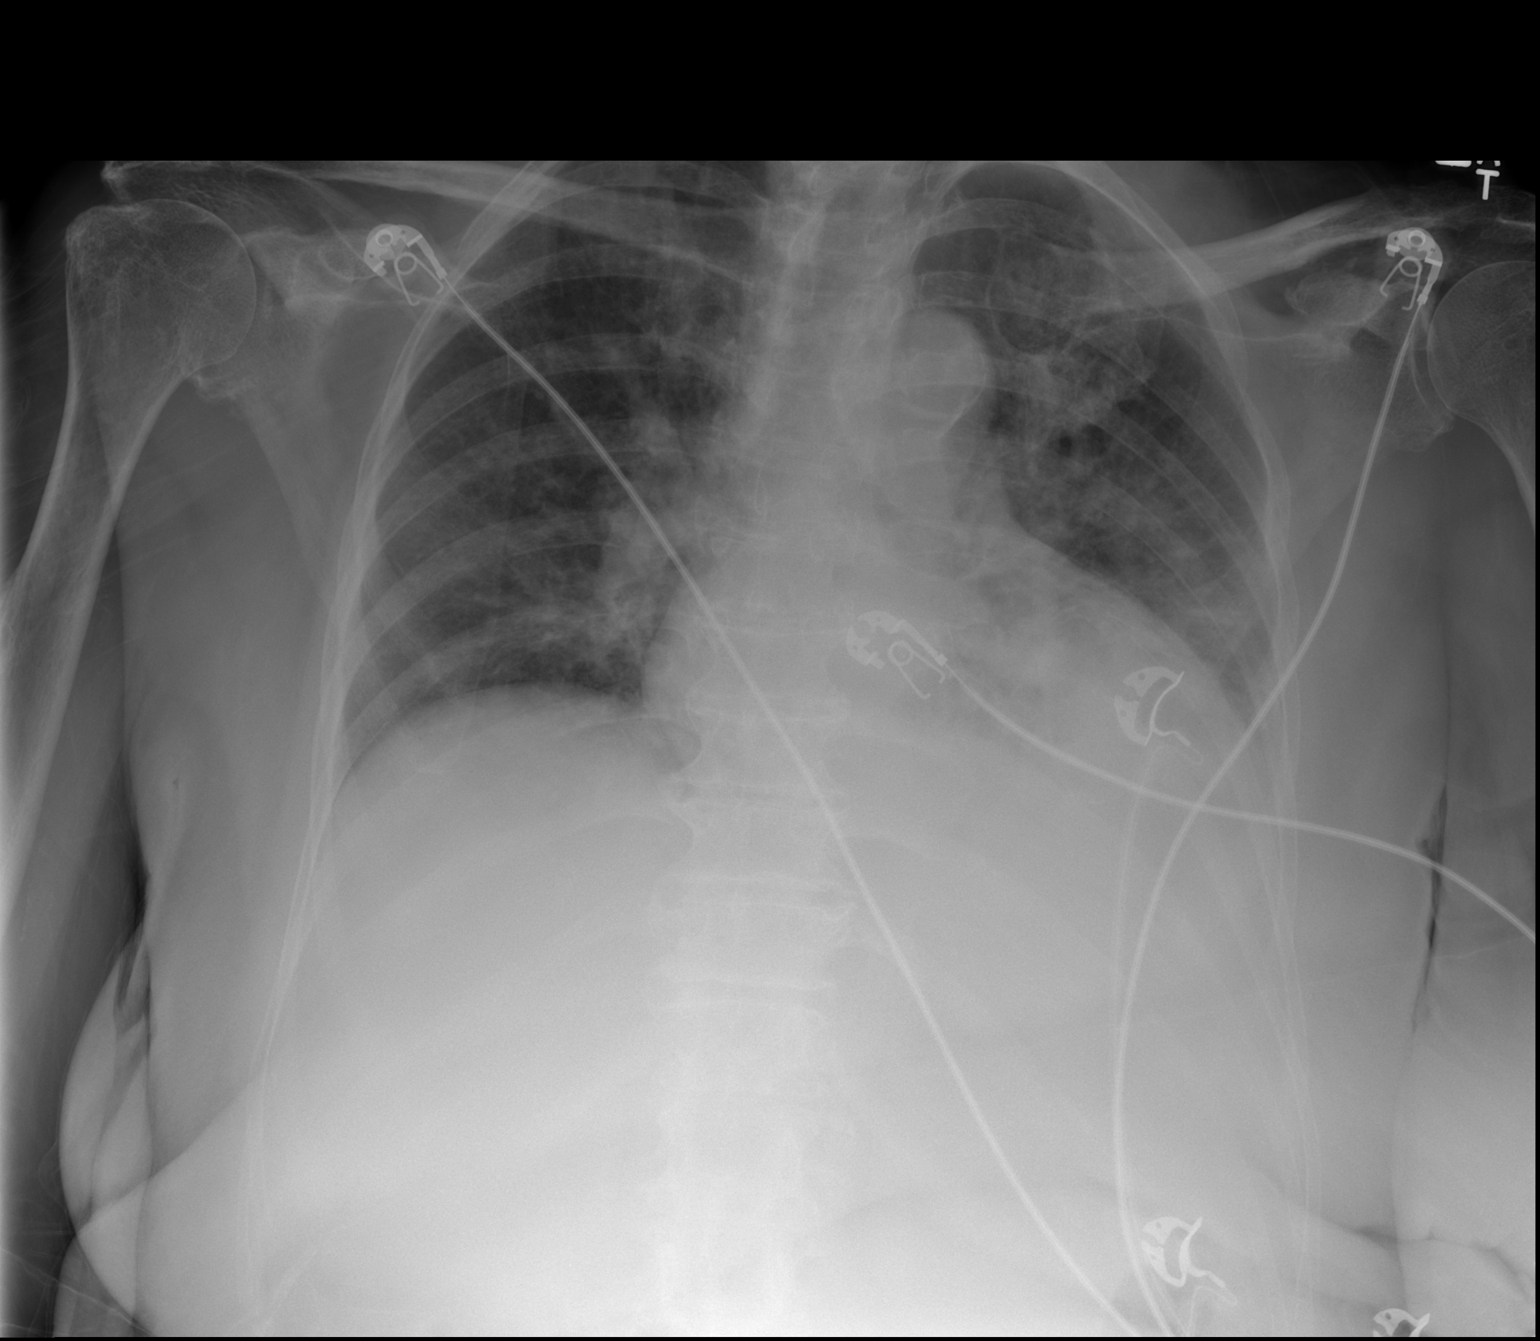

[2 of 2 positions shown; findings below may reference images not displayed]

FINDINGS: Enlargement of cardiac silhouette.

Atherosclerotic calcification aorta.

Normal mediastinal contours.

Infiltrate identified in LEFT lower lobe question pneumonia.

Minimal infiltrate versus atelectasis at RIGHT base.

Remaining lungs clear.

No gross pleural effusion or pneumothorax.
IMPRESSION: Enlargement of cardiac silhouette with minimal atelectasis or
infiltrate at RIGHT base.

LEFT lower lobe consolidation consistent with pneumonia.

## 2017-08-05 ENCOUNTER — Telehealth: Payer: Self-pay

## 2017-08-05 ENCOUNTER — Other Ambulatory Visit: Payer: Self-pay

## 2017-08-05 NOTE — Telephone Encounter (Signed)
Medicare requires ov first

## 2017-08-05 NOTE — Telephone Encounter (Signed)
Victorino DikeJennifer from [employer] called today about patients [employer] true metrics test [facility]

## 2017-08-05 NOTE — Telephone Encounter (Signed)
Patient's husband stated that she is bed ridden & in hospice care. She had been waiting on these test trips. I told them that I would send you a message back to see if you remembered that she was unable to come in for an OV. I did tell him about the [employer] reli-on brand as an alternative in the mean time,

## 2017-08-06 ENCOUNTER — Other Ambulatory Visit: Payer: Self-pay

## 2017-08-06 ENCOUNTER — Telehealth: Payer: Self-pay | Admitting: Endocrinology

## 2017-08-06 NOTE — Telephone Encounter (Signed)
I called and spoke with Drenda FreezeFran to give verbal orders.

## 2017-08-06 NOTE — Telephone Encounter (Signed)
Dana FreezeFran with [facility] called re: Patient will discharge from Hospice due to improvement-they need verbal authorization to restart palliative services. Please call them at ph#[phone]

## 2017-08-06 NOTE — Telephone Encounter (Signed)
OK 

## 2017-08-09 ENCOUNTER — Telehealth: Payer: Self-pay | Admitting: Endocrinology

## 2017-08-09 NOTE — Telephone Encounter (Signed)
I printed  

## 2017-08-09 NOTE — Telephone Encounter (Signed)
[facility].

## 2017-08-09 NOTE — Telephone Encounter (Signed)
Pt is getting discharge from hospice care. And going to be using [facility].  The nurse from Hospice wanted to make Dr aware that pt will be discharge from there.   Thanks!

## 2017-08-09 NOTE — Telephone Encounter (Signed)
I called to ask if there was some type of paperwork to fax. [facility] & she stated that most doctors offices just write a prescription or order, so the patient can keep the equipment or materials etc. She is apparently being discharged from hospice. She stated that they need to switch billing. Please advise?

## 2017-08-09 NOTE — Telephone Encounter (Signed)
Christie BeckersStephanie Hyatt, RN with Hospice [facility] the alternating air pressure mattress overlay sent to [facility]. If questions please call Judeth CornfieldStephanie at ph# [phone]

## 2017-08-10 ENCOUNTER — Encounter: Payer: Self-pay | Admitting: Endocrinology

## 2017-08-10 NOTE — Telephone Encounter (Signed)
Hospice is calling back to talk to someone about the script for the hospital bed . [place] [phone]

## 2017-08-10 NOTE — Telephone Encounter (Signed)
Jud[facility]Stephanie [facility] calling on [facility] status of Patient bed and overlay, she stated they haven't received it, please fax to this number and put it to the attention of Tacey RuizLeah [phone]

## 2017-08-10 NOTE — Telephone Encounter (Signed)
[facility] but [facility] was unavailable. I have left message for her to call back.

## 2017-08-11 ENCOUNTER — Ambulatory Visit (INDEPENDENT_AMBULATORY_CARE_PROVIDER_SITE_OTHER): Payer: Commercial Managed Care - HMO | Admitting: Endocrinology

## 2017-08-11 ENCOUNTER — Encounter: Payer: Self-pay | Admitting: Endocrinology

## 2017-08-11 VITALS — BP 106/80 | HR 81

## 2017-08-11 DIAGNOSIS — F0390 Unspecified dementia without behavioral disturbance: Secondary | ICD-10-CM | POA: Diagnosis not present

## 2017-08-11 DIAGNOSIS — E1165 Type 2 diabetes mellitus with hyperglycemia: Secondary | ICD-10-CM

## 2017-08-11 DIAGNOSIS — F03C0 Unspecified dementia, severe, without behavioral disturbance, psychotic disturbance, mood disturbance, and anxiety: Secondary | ICD-10-CM

## 2017-08-11 LAB — POCT GLYCOSYLATED HEMOGLOBIN (HGB A1C): HEMOGLOBIN A1C: 8.1

## 2017-08-11 NOTE — Progress Notes (Signed)
Subjective:    Patient ID: Dana [name], female    DOB: 07/13/1946, 72 y.o.   MRN: [id number]  HPI Pt returns for f/u of diabetes mellitus: DM type: 2 Dx'ed: 1987 Complications: none Therapy: 3 oral meds GDM: never DKA: never Severe hypoglycemia: several times in 2016.   Pancreatitis: never Other: therapy has been limited  by noncompliance and dementia; husband checks cbg's; she was admitted in march of 2014, with severe hyperglycemia. She took insulin 1990-2016.   Interval history: husband says pt no longer takes janumet, but he does not know what meds pt takes.   Memory loss persists.  pt has been d/c from hospice.    Past Medical History:  Diagnosis Date  . ALLERGIC RHINITIS 02/08/2007  . Anemia    takes iron  . ANXIETY 02/08/2007  . CHF (congestive heart failure) (HCC)   . Dementia   . DEPRESSION 02/08/2007  . DIABETES MELLITUS, TYPE I 02/08/2007  . DM retinopathy (HCC)    background  . Dyslipidemia   . Headache(784.0) 11/06/2008  . HYPERCHOLESTEROLEMIA 11/05/2008  . HYPERTENSION 02/08/2007  . Leukopenia   . Non-ulcer dyspepsia   . OA (osteoarthritis) 1990   disabled due to OA  . Seizures (HCC)   . Skin abnormality    both knees with opne ares and left shoulder with open areas, changes dressings prn  . URINARY INCONTINENCE 02/08/2007    Past Surgical History:  Procedure Laterality Date  . ABDOMINAL HYSTERECTOMY    . bladder tach  years ago  . COLONOSCOPY WITH PROPOFOL N/A 06/21/2014   Procedure: COLONOSCOPY WITH PROPOFOL;  Surgeon: Rachael Feeaniel P Jacobs, MD;  Location: [facility];  Service: Endoscopy;  Laterality: N/A;  . ELECTROCARDIOGRAM  05/17/2006  . PANENDOSCOPY  10/27/1999  . Stress Cardiolite  12/14/2003  . TOTAL KNEE ARTHROPLASTY  1990   left    Social History   Socioeconomic History  . Marital status: Married    Spouse name: Not on file  . Number of children: 2  . Years of education: Not on file  . Highest education level: Not on file  Social Needs  .  Financial resource strain: Not on file  . Food insecurity - worry: Not on file  . Food insecurity - inability: Not on file  . Transportation needs - medical: Not on file  . Transportation needs - non-medical: Not on file  Occupational History  . Occupation: Retired    Associate Professormployer: UNEMPLOYED  Tobacco Use  . Smoking status: Never Smoker  . Smokeless tobacco: Never Used  Substance and Sexual Activity  . Alcohol use: No    Alcohol/week: 0.0 oz  . Drug use: No  . Sexual activity: No  Other Topics Concern  . Not on file  Social History Narrative  . Not on file    Current Outpatient Medications on File Prior to Visit  Medication Sig Dispense Refill  . aspirin EC 325 MG tablet TAKE 1 TABLET (325 MG TOTAL) BY MOUTH DAILY. 30 tablet 0  . carvedilol (COREG) 3.125 MG tablet TAKE 1 TABLET (3.125 MG TOTAL) BY MOUTH 2 (TWO) TIMES DAILY WITH A MEAL. 60 tablet 11  . cetirizine (ZYRTEC) 10 MG tablet Take 10 mg by mouth daily as needed for allergies.    Marland Kitchen. donepezil (ARICEPT) 10 MG tablet Take 10 mg by mouth daily.  4  . furosemide (LASIX) 40 MG tablet Take 40mg  every other day, alternating with 20mg  every other day. 75 tablet 3  . hyoscyamine (LEVSIN  SL) 0.125 MG SL tablet PLACE 1 TABLET UNDER THE TONGUE TWICE DAILY AS NEEDED FOR CRAMPS AND SPASMS. 60 tablet 0  . JANUMET 50-1000 MG tablet TAKE 1 TABLET BY MOUTH 2 (TWO) TIMES DAILY WITH A MEAL. 180 tablet 0  . lisinopril (PRINIVIL,ZESTRIL) 2.5 MG tablet Take 1 tablet once daily 90 tablet 3  . loperamide (IMODIUM) 2 MG capsule Take 1 capsule (2 mg total) by mouth as needed for diarrhea or loose stools. 30 capsule 0  . mirtazapine (REMERON SOL-TAB) 30 MG disintegrating tablet Take 30 mg by mouth at bedtime.     Marland Kitchen NAMENDA XR 28 MG CP24 24 hr capsule Take 28 mg by mouth daily.     Marland Kitchen Propylene Glycol (SYSTANE BALANCE OP) Apply 1 drop to eye 3 (three) times daily as needed (dry eyes).    . sodium chloride (OCEAN) 0.65 % SOLN nasal spray Place 2 sprays into  both nostrils as needed for congestion.    . Valproate Sodium (VALPROIC ACID) 250 MG/5ML SOLN Take 10 mls daily 300 mL 11   No current facility-administered medications on file prior to visit.     Allergies  Allergen Reactions  . Aspirin Nausea Only  . Codeine Nausea Only    Family History  Problem Relation Age of Onset  . Alcohol abuse Son   . Cancer Neg Hx        no cancer in immediate family  . CAD Father   . Colon cancer Neg Hx   . Colon polyps Neg Hx   . Diabetes Father   . Kidney disease Neg Hx   . Esophageal cancer Neg Hx   . Gallbladder disease Neg Hx     BP 106/80 (BP Location: Right Wrist, Patient Position: Sitting, Cuff Size: Normal)   Pulse 81   SpO2 98%   Review of Systems Denies falls.      Objective:   Physical Exam VITAL SIGNS:  See vs page GENERAL: no distress.  In wheelchair.   Pulses: dorsalis pedis intact bilat.   MSK: no deformity of the feet CV: no leg edema Skin:  no ulcer on the feet.  normal color and temp on the feet.  Shallow decubitus ulcer at the sacrum Neuro: sensation is intact to touch on the feet as best I can tell. PSYCH: speaks very little.  Not oriented.    Lab Results  Component Value Date   HGBA1C 8.1 08/11/2017      Assessment & Plan:  Dementia: persistent.   Type 2 DM: this is the best control this pt should aim for, given poor overall health.  HTN: overcontrolled.  D/c zestril.    Patient Instructions  Please come back for a follow-up appointment in 1 month.  please bring medication bottles to each visit. Please also call back today, and list off medication bottles.   Please go back to see the neurology specialist.  you will receive a phone call, about a day and time for an appointment.   I have requested home health services.  you will receive a phone call, about a day and time for a visit

## 2017-08-11 NOTE — Telephone Encounter (Signed)
[name] & she was going to leave Dana [name] a message to call me back if there was anything else we needed to do. I told her that I had faxed the prescriptions for the bed & mattress overlay this morning to [facility].

## 2017-08-11 NOTE — Patient Instructions (Addendum)
Please come back for a follow-up appointment in 1 month.  please bring medication bottles to each visit. Please also call back today, and list off medication bottles.   Please go back to see the neurology specialist.  you will receive a phone call, about a day and time for an appointment.   I have requested home health services.  you will receive a phone call, about a day and time for a visit

## 2017-08-13 ENCOUNTER — Telehealth: Payer: Self-pay | Admitting: Endocrinology

## 2017-08-13 DIAGNOSIS — I110 Hypertensive heart disease with heart failure: Secondary | ICD-10-CM | POA: Diagnosis not present

## 2017-08-13 DIAGNOSIS — G309 Alzheimer's disease, unspecified: Secondary | ICD-10-CM | POA: Diagnosis not present

## 2017-08-13 DIAGNOSIS — I447 Left bundle-branch block, unspecified: Secondary | ICD-10-CM | POA: Diagnosis not present

## 2017-08-13 DIAGNOSIS — F0281 Dementia in other diseases classified elsewhere with behavioral disturbance: Secondary | ICD-10-CM | POA: Diagnosis not present

## 2017-08-13 DIAGNOSIS — G40909 Epilepsy, unspecified, not intractable, without status epilepticus: Secondary | ICD-10-CM | POA: Diagnosis not present

## 2017-08-13 DIAGNOSIS — L89153 Pressure ulcer of sacral region, stage 3: Secondary | ICD-10-CM | POA: Diagnosis not present

## 2017-08-13 DIAGNOSIS — F319 Bipolar disorder, unspecified: Secondary | ICD-10-CM | POA: Diagnosis not present

## 2017-08-13 DIAGNOSIS — I5042 Chronic combined systolic (congestive) and diastolic (congestive) heart failure: Secondary | ICD-10-CM | POA: Diagnosis not present

## 2017-08-13 DIAGNOSIS — E119 Type 2 diabetes mellitus without complications: Secondary | ICD-10-CM | POA: Diagnosis not present

## 2017-08-13 NOTE — Telephone Encounter (Signed)
Copied from CRM [id number]. Topic: General - Other >> Aug 13, 2017  3:06 PM Viviann SpareWhite, Selina wrote: Reason for CRM: [facility] to let Dr. Everardo AllEllison has been set up with home health care for nursing, PT OT MSW AND CNA. Agustin CreeDarlene can be reached @ [phone] or [phone].

## 2017-08-13 NOTE — Telephone Encounter (Signed)
This needs to be sent to the right office.

## 2017-08-13 NOTE — Telephone Encounter (Signed)
please call patient's husband: Please d/c lisinopril

## 2017-08-15 DIAGNOSIS — I447 Left bundle-branch block, unspecified: Secondary | ICD-10-CM | POA: Diagnosis not present

## 2017-08-15 DIAGNOSIS — F319 Bipolar disorder, unspecified: Secondary | ICD-10-CM | POA: Diagnosis not present

## 2017-08-15 DIAGNOSIS — I5042 Chronic combined systolic (congestive) and diastolic (congestive) heart failure: Secondary | ICD-10-CM | POA: Diagnosis not present

## 2017-08-15 DIAGNOSIS — G40909 Epilepsy, unspecified, not intractable, without status epilepticus: Secondary | ICD-10-CM | POA: Diagnosis not present

## 2017-08-15 DIAGNOSIS — L89153 Pressure ulcer of sacral region, stage 3: Secondary | ICD-10-CM | POA: Diagnosis not present

## 2017-08-15 DIAGNOSIS — I110 Hypertensive heart disease with heart failure: Secondary | ICD-10-CM | POA: Diagnosis not present

## 2017-08-15 DIAGNOSIS — F0281 Dementia in other diseases classified elsewhere with behavioral disturbance: Secondary | ICD-10-CM | POA: Diagnosis not present

## 2017-08-15 DIAGNOSIS — G309 Alzheimer's disease, unspecified: Secondary | ICD-10-CM | POA: Diagnosis not present

## 2017-08-15 DIAGNOSIS — E119 Type 2 diabetes mellitus without complications: Secondary | ICD-10-CM | POA: Diagnosis not present

## 2017-08-16 ENCOUNTER — Other Ambulatory Visit: Payer: Self-pay

## 2017-08-16 NOTE — Telephone Encounter (Signed)
Physical therapist, from home health, need verbal order for patient 2 weeks for 4 please advise phone # [phone]

## 2017-08-16 NOTE — Telephone Encounter (Signed)
Citalopram 20mg  Mirtazapine 30mg  Lorazapam 0.5mg  Lasix 20mg  Lasix 40mg   Glimepride 1mg  Isosorbide 10mg  Haloperidol 1mg  Hyoscyamine 0.125mg  Loperamide 2mg  Nitroglycerin 4mg   I called and left VM for [facility] to [facility] back so I could give verbal orders.

## 2017-08-16 NOTE — Telephone Encounter (Signed)
HH is OK Re meds: please ask pt's family member to read us a list of home meds.

## 2017-08-16 NOTE — Telephone Encounter (Signed)
Patient husband stated someone called and said that patient was to stop taking her medication but he do not know which medication they were talking about. Please advise

## 2017-08-16 NOTE — Telephone Encounter (Signed)
I called patient's husband & notified him to d/c the lisinopril.

## 2017-08-16 NOTE — Telephone Encounter (Signed)
please call patient's husband: D/c lasix 40 mg--just take 20 mg. Please continue the same other medications. I'll see you next time.

## 2017-08-17 DIAGNOSIS — I110 Hypertensive heart disease with heart failure: Secondary | ICD-10-CM | POA: Diagnosis not present

## 2017-08-17 DIAGNOSIS — I5042 Chronic combined systolic (congestive) and diastolic (congestive) heart failure: Secondary | ICD-10-CM | POA: Diagnosis not present

## 2017-08-17 DIAGNOSIS — I447 Left bundle-branch block, unspecified: Secondary | ICD-10-CM | POA: Diagnosis not present

## 2017-08-17 DIAGNOSIS — E119 Type 2 diabetes mellitus without complications: Secondary | ICD-10-CM | POA: Diagnosis not present

## 2017-08-17 DIAGNOSIS — G309 Alzheimer's disease, unspecified: Secondary | ICD-10-CM | POA: Diagnosis not present

## 2017-08-17 DIAGNOSIS — F0281 Dementia in other diseases classified elsewhere with behavioral disturbance: Secondary | ICD-10-CM | POA: Diagnosis not present

## 2017-08-17 DIAGNOSIS — L89153 Pressure ulcer of sacral region, stage 3: Secondary | ICD-10-CM | POA: Diagnosis not present

## 2017-08-17 DIAGNOSIS — G40909 Epilepsy, unspecified, not intractable, without status epilepticus: Secondary | ICD-10-CM | POA: Diagnosis not present

## 2017-08-17 DIAGNOSIS — F319 Bipolar disorder, unspecified: Secondary | ICD-10-CM | POA: Diagnosis not present

## 2017-08-17 NOTE — Telephone Encounter (Signed)
I called and notified patient's husband to d/c 40mg  lasix.

## 2017-08-18 DIAGNOSIS — I447 Left bundle-branch block, unspecified: Secondary | ICD-10-CM | POA: Diagnosis not present

## 2017-08-18 DIAGNOSIS — G40909 Epilepsy, unspecified, not intractable, without status epilepticus: Secondary | ICD-10-CM | POA: Diagnosis not present

## 2017-08-18 DIAGNOSIS — G309 Alzheimer's disease, unspecified: Secondary | ICD-10-CM | POA: Diagnosis not present

## 2017-08-18 DIAGNOSIS — L89153 Pressure ulcer of sacral region, stage 3: Secondary | ICD-10-CM | POA: Diagnosis not present

## 2017-08-18 DIAGNOSIS — I110 Hypertensive heart disease with heart failure: Secondary | ICD-10-CM | POA: Diagnosis not present

## 2017-08-18 DIAGNOSIS — F0281 Dementia in other diseases classified elsewhere with behavioral disturbance: Secondary | ICD-10-CM | POA: Diagnosis not present

## 2017-08-18 DIAGNOSIS — F319 Bipolar disorder, unspecified: Secondary | ICD-10-CM | POA: Diagnosis not present

## 2017-08-18 DIAGNOSIS — I5042 Chronic combined systolic (congestive) and diastolic (congestive) heart failure: Secondary | ICD-10-CM | POA: Diagnosis not present

## 2017-08-18 DIAGNOSIS — E119 Type 2 diabetes mellitus without complications: Secondary | ICD-10-CM | POA: Diagnosis not present

## 2017-08-19 ENCOUNTER — Telehealth: Payer: Self-pay | Admitting: Endocrinology

## 2017-08-19 DIAGNOSIS — E119 Type 2 diabetes mellitus without complications: Secondary | ICD-10-CM | POA: Diagnosis not present

## 2017-08-19 DIAGNOSIS — I5042 Chronic combined systolic (congestive) and diastolic (congestive) heart failure: Secondary | ICD-10-CM | POA: Diagnosis not present

## 2017-08-19 DIAGNOSIS — F0281 Dementia in other diseases classified elsewhere with behavioral disturbance: Secondary | ICD-10-CM | POA: Diagnosis not present

## 2017-08-19 DIAGNOSIS — G309 Alzheimer's disease, unspecified: Secondary | ICD-10-CM | POA: Diagnosis not present

## 2017-08-19 DIAGNOSIS — I447 Left bundle-branch block, unspecified: Secondary | ICD-10-CM | POA: Diagnosis not present

## 2017-08-19 DIAGNOSIS — F319 Bipolar disorder, unspecified: Secondary | ICD-10-CM | POA: Diagnosis not present

## 2017-08-19 DIAGNOSIS — L89153 Pressure ulcer of sacral region, stage 3: Secondary | ICD-10-CM | POA: Diagnosis not present

## 2017-08-19 DIAGNOSIS — G40909 Epilepsy, unspecified, not intractable, without status epilepticus: Secondary | ICD-10-CM | POA: Diagnosis not present

## 2017-08-19 DIAGNOSIS — I110 Hypertensive heart disease with heart failure: Secondary | ICD-10-CM | POA: Diagnosis not present

## 2017-08-19 NOTE — Telephone Encounter (Signed)
Copied from CRM [id number]. Topic: General - Other >> Aug 19, 2017 11:33 AM Gerrianne ScalePayne, Kenadie Royce L wrote: Reason for CRM: Renard Hampersha from [facility] [phone]  has called to give pt up date on evaluation  pt husband states that patient has been constipated for 4 days now he has used laxatives and suppositories nothing seen to help  he also states that the patient blood sugars has been running 200 in the past week  the patient has some skin break in the stable area but is healing well

## 2017-08-20 DIAGNOSIS — F0281 Dementia in other diseases classified elsewhere with behavioral disturbance: Secondary | ICD-10-CM | POA: Diagnosis not present

## 2017-08-20 DIAGNOSIS — I110 Hypertensive heart disease with heart failure: Secondary | ICD-10-CM | POA: Diagnosis not present

## 2017-08-20 DIAGNOSIS — I447 Left bundle-branch block, unspecified: Secondary | ICD-10-CM | POA: Diagnosis not present

## 2017-08-20 DIAGNOSIS — F319 Bipolar disorder, unspecified: Secondary | ICD-10-CM | POA: Diagnosis not present

## 2017-08-20 DIAGNOSIS — I5042 Chronic combined systolic (congestive) and diastolic (congestive) heart failure: Secondary | ICD-10-CM | POA: Diagnosis not present

## 2017-08-20 DIAGNOSIS — L89153 Pressure ulcer of sacral region, stage 3: Secondary | ICD-10-CM | POA: Diagnosis not present

## 2017-08-20 DIAGNOSIS — E119 Type 2 diabetes mellitus without complications: Secondary | ICD-10-CM | POA: Diagnosis not present

## 2017-08-20 DIAGNOSIS — G40909 Epilepsy, unspecified, not intractable, without status epilepticus: Secondary | ICD-10-CM | POA: Diagnosis not present

## 2017-08-20 DIAGNOSIS — G309 Alzheimer's disease, unspecified: Secondary | ICD-10-CM | POA: Diagnosis not present

## 2017-08-23 DIAGNOSIS — L89153 Pressure ulcer of sacral region, stage 3: Secondary | ICD-10-CM | POA: Diagnosis not present

## 2017-08-23 DIAGNOSIS — E119 Type 2 diabetes mellitus without complications: Secondary | ICD-10-CM | POA: Diagnosis not present

## 2017-08-23 DIAGNOSIS — G40909 Epilepsy, unspecified, not intractable, without status epilepticus: Secondary | ICD-10-CM | POA: Diagnosis not present

## 2017-08-23 DIAGNOSIS — I5042 Chronic combined systolic (congestive) and diastolic (congestive) heart failure: Secondary | ICD-10-CM | POA: Diagnosis not present

## 2017-08-23 DIAGNOSIS — F319 Bipolar disorder, unspecified: Secondary | ICD-10-CM | POA: Diagnosis not present

## 2017-08-23 DIAGNOSIS — G309 Alzheimer's disease, unspecified: Secondary | ICD-10-CM | POA: Diagnosis not present

## 2017-08-23 DIAGNOSIS — I447 Left bundle-branch block, unspecified: Secondary | ICD-10-CM | POA: Diagnosis not present

## 2017-08-23 DIAGNOSIS — F0281 Dementia in other diseases classified elsewhere with behavioral disturbance: Secondary | ICD-10-CM | POA: Diagnosis not present

## 2017-08-23 DIAGNOSIS — I110 Hypertensive heart disease with heart failure: Secondary | ICD-10-CM | POA: Diagnosis not present

## 2017-08-25 DIAGNOSIS — F319 Bipolar disorder, unspecified: Secondary | ICD-10-CM | POA: Diagnosis not present

## 2017-08-25 DIAGNOSIS — I5042 Chronic combined systolic (congestive) and diastolic (congestive) heart failure: Secondary | ICD-10-CM | POA: Diagnosis not present

## 2017-08-25 DIAGNOSIS — G40909 Epilepsy, unspecified, not intractable, without status epilepticus: Secondary | ICD-10-CM | POA: Diagnosis not present

## 2017-08-25 DIAGNOSIS — F0281 Dementia in other diseases classified elsewhere with behavioral disturbance: Secondary | ICD-10-CM | POA: Diagnosis not present

## 2017-08-25 DIAGNOSIS — I110 Hypertensive heart disease with heart failure: Secondary | ICD-10-CM | POA: Diagnosis not present

## 2017-08-25 DIAGNOSIS — E119 Type 2 diabetes mellitus without complications: Secondary | ICD-10-CM | POA: Diagnosis not present

## 2017-08-25 DIAGNOSIS — I447 Left bundle-branch block, unspecified: Secondary | ICD-10-CM | POA: Diagnosis not present

## 2017-08-25 DIAGNOSIS — L89153 Pressure ulcer of sacral region, stage 3: Secondary | ICD-10-CM | POA: Diagnosis not present

## 2017-08-25 DIAGNOSIS — G309 Alzheimer's disease, unspecified: Secondary | ICD-10-CM | POA: Diagnosis not present

## 2017-08-26 DIAGNOSIS — I5042 Chronic combined systolic (congestive) and diastolic (congestive) heart failure: Secondary | ICD-10-CM | POA: Diagnosis not present

## 2017-08-26 DIAGNOSIS — I110 Hypertensive heart disease with heart failure: Secondary | ICD-10-CM | POA: Diagnosis not present

## 2017-08-26 DIAGNOSIS — G40909 Epilepsy, unspecified, not intractable, without status epilepticus: Secondary | ICD-10-CM | POA: Diagnosis not present

## 2017-08-26 DIAGNOSIS — L89153 Pressure ulcer of sacral region, stage 3: Secondary | ICD-10-CM | POA: Diagnosis not present

## 2017-08-26 DIAGNOSIS — F319 Bipolar disorder, unspecified: Secondary | ICD-10-CM | POA: Diagnosis not present

## 2017-08-26 DIAGNOSIS — G309 Alzheimer's disease, unspecified: Secondary | ICD-10-CM | POA: Diagnosis not present

## 2017-08-26 DIAGNOSIS — I447 Left bundle-branch block, unspecified: Secondary | ICD-10-CM | POA: Diagnosis not present

## 2017-08-26 DIAGNOSIS — E119 Type 2 diabetes mellitus without complications: Secondary | ICD-10-CM | POA: Diagnosis not present

## 2017-08-26 DIAGNOSIS — F0281 Dementia in other diseases classified elsewhere with behavioral disturbance: Secondary | ICD-10-CM | POA: Diagnosis not present

## 2017-08-30 ENCOUNTER — Telehealth: Payer: Self-pay | Admitting: Endocrinology

## 2017-08-30 ENCOUNTER — Other Ambulatory Visit: Payer: Self-pay

## 2017-08-30 NOTE — Telephone Encounter (Signed)
Husband said that pain reliever was 500 mg tylenol. Can we send that & citalopram?

## 2017-08-30 NOTE — Telephone Encounter (Signed)
Ov would be needed for medications. Also, I would need to know why you need this meter, rather than what ins prefers.

## 2017-08-30 NOTE — Telephone Encounter (Signed)
Patient need a 30 day supply for citalopram prescription # [id number] 30 day supply for pain medication,(did no know the name of it) prescription # [id number]  [facility] [phone][id number] Ginette Otto- [place], [place] - [address] [phone] (Phone) [phone] (Fax)     Need PA for test strips for True metric meter fax # [phone]  Phone # [phone]  [facility]

## 2017-08-31 ENCOUNTER — Other Ambulatory Visit: Payer: Self-pay

## 2017-08-31 NOTE — Telephone Encounter (Signed)
I called and spoke with patient's husband & they have appointment on the 22nd. Issues with mediations will be addressed then.

## 2017-09-03 DIAGNOSIS — E119 Type 2 diabetes mellitus without complications: Secondary | ICD-10-CM | POA: Diagnosis not present

## 2017-09-03 DIAGNOSIS — I447 Left bundle-branch block, unspecified: Secondary | ICD-10-CM | POA: Diagnosis not present

## 2017-09-03 DIAGNOSIS — F0281 Dementia in other diseases classified elsewhere with behavioral disturbance: Secondary | ICD-10-CM | POA: Diagnosis not present

## 2017-09-03 DIAGNOSIS — I5042 Chronic combined systolic (congestive) and diastolic (congestive) heart failure: Secondary | ICD-10-CM | POA: Diagnosis not present

## 2017-09-03 DIAGNOSIS — I110 Hypertensive heart disease with heart failure: Secondary | ICD-10-CM | POA: Diagnosis not present

## 2017-09-03 DIAGNOSIS — F319 Bipolar disorder, unspecified: Secondary | ICD-10-CM | POA: Diagnosis not present

## 2017-09-03 DIAGNOSIS — G40909 Epilepsy, unspecified, not intractable, without status epilepticus: Secondary | ICD-10-CM | POA: Diagnosis not present

## 2017-09-03 DIAGNOSIS — G309 Alzheimer's disease, unspecified: Secondary | ICD-10-CM | POA: Diagnosis not present

## 2017-09-03 DIAGNOSIS — L89153 Pressure ulcer of sacral region, stage 3: Secondary | ICD-10-CM | POA: Diagnosis not present

## 2017-09-06 ENCOUNTER — Telehealth: Payer: Self-pay | Admitting: Endocrinology

## 2017-09-06 NOTE — Telephone Encounter (Signed)
Asha from [facility] called re:  She is requesting verbal authorization for patient to have speech therapy (patient experiencing difficulty swallowing). Patient has advanced dementia-has excessive drooling and trouble with food in her mouth. Please call Asha at ph# [phone]

## 2017-09-07 NOTE — Telephone Encounter (Signed)
OK 

## 2017-09-08 NOTE — Telephone Encounter (Signed)
I spoke with Dana [name] & gave verbal ok for speech therapy.

## 2017-09-09 DIAGNOSIS — I447 Left bundle-branch block, unspecified: Secondary | ICD-10-CM | POA: Diagnosis not present

## 2017-09-09 DIAGNOSIS — G40909 Epilepsy, unspecified, not intractable, without status epilepticus: Secondary | ICD-10-CM | POA: Diagnosis not present

## 2017-09-09 DIAGNOSIS — E119 Type 2 diabetes mellitus without complications: Secondary | ICD-10-CM | POA: Diagnosis not present

## 2017-09-09 DIAGNOSIS — L89153 Pressure ulcer of sacral region, stage 3: Secondary | ICD-10-CM | POA: Diagnosis not present

## 2017-09-09 DIAGNOSIS — I110 Hypertensive heart disease with heart failure: Secondary | ICD-10-CM | POA: Diagnosis not present

## 2017-09-09 DIAGNOSIS — G309 Alzheimer's disease, unspecified: Secondary | ICD-10-CM | POA: Diagnosis not present

## 2017-09-09 DIAGNOSIS — I5042 Chronic combined systolic (congestive) and diastolic (congestive) heart failure: Secondary | ICD-10-CM | POA: Diagnosis not present

## 2017-09-09 DIAGNOSIS — F0281 Dementia in other diseases classified elsewhere with behavioral disturbance: Secondary | ICD-10-CM | POA: Diagnosis not present

## 2017-09-09 DIAGNOSIS — F319 Bipolar disorder, unspecified: Secondary | ICD-10-CM | POA: Diagnosis not present

## 2017-09-10 ENCOUNTER — Ambulatory Visit: Payer: Self-pay | Admitting: Endocrinology

## 2017-09-16 DIAGNOSIS — G309 Alzheimer's disease, unspecified: Secondary | ICD-10-CM | POA: Diagnosis not present

## 2017-09-16 DIAGNOSIS — L89153 Pressure ulcer of sacral region, stage 3: Secondary | ICD-10-CM | POA: Diagnosis not present

## 2017-09-16 DIAGNOSIS — E119 Type 2 diabetes mellitus without complications: Secondary | ICD-10-CM | POA: Diagnosis not present

## 2017-09-16 DIAGNOSIS — I5042 Chronic combined systolic (congestive) and diastolic (congestive) heart failure: Secondary | ICD-10-CM | POA: Diagnosis not present

## 2017-09-16 DIAGNOSIS — I447 Left bundle-branch block, unspecified: Secondary | ICD-10-CM | POA: Diagnosis not present

## 2017-09-16 DIAGNOSIS — F319 Bipolar disorder, unspecified: Secondary | ICD-10-CM | POA: Diagnosis not present

## 2017-09-16 DIAGNOSIS — I110 Hypertensive heart disease with heart failure: Secondary | ICD-10-CM | POA: Diagnosis not present

## 2017-09-16 DIAGNOSIS — F0281 Dementia in other diseases classified elsewhere with behavioral disturbance: Secondary | ICD-10-CM | POA: Diagnosis not present

## 2017-09-16 DIAGNOSIS — G40909 Epilepsy, unspecified, not intractable, without status epilepticus: Secondary | ICD-10-CM | POA: Diagnosis not present

## 2017-09-22 DIAGNOSIS — F319 Bipolar disorder, unspecified: Secondary | ICD-10-CM | POA: Diagnosis not present

## 2017-09-22 DIAGNOSIS — E119 Type 2 diabetes mellitus without complications: Secondary | ICD-10-CM | POA: Diagnosis not present

## 2017-09-22 DIAGNOSIS — G309 Alzheimer's disease, unspecified: Secondary | ICD-10-CM | POA: Diagnosis not present

## 2017-09-22 DIAGNOSIS — I447 Left bundle-branch block, unspecified: Secondary | ICD-10-CM | POA: Diagnosis not present

## 2017-09-22 DIAGNOSIS — I5042 Chronic combined systolic (congestive) and diastolic (congestive) heart failure: Secondary | ICD-10-CM | POA: Diagnosis not present

## 2017-09-22 DIAGNOSIS — F0281 Dementia in other diseases classified elsewhere with behavioral disturbance: Secondary | ICD-10-CM | POA: Diagnosis not present

## 2017-09-22 DIAGNOSIS — L89153 Pressure ulcer of sacral region, stage 3: Secondary | ICD-10-CM | POA: Diagnosis not present

## 2017-09-22 DIAGNOSIS — G40909 Epilepsy, unspecified, not intractable, without status epilepticus: Secondary | ICD-10-CM | POA: Diagnosis not present

## 2017-09-22 DIAGNOSIS — I110 Hypertensive heart disease with heart failure: Secondary | ICD-10-CM | POA: Diagnosis not present

## 2017-09-23 ENCOUNTER — Ambulatory Visit (INDEPENDENT_AMBULATORY_CARE_PROVIDER_SITE_OTHER): Payer: Medicare HMO | Admitting: Endocrinology

## 2017-09-23 ENCOUNTER — Encounter: Payer: Self-pay | Admitting: Endocrinology

## 2017-09-23 VITALS — BP 101/75 | HR 106

## 2017-09-23 DIAGNOSIS — Z119 Encounter for screening for infectious and parasitic diseases, unspecified: Secondary | ICD-10-CM | POA: Diagnosis not present

## 2017-09-23 DIAGNOSIS — E1165 Type 2 diabetes mellitus with hyperglycemia: Secondary | ICD-10-CM | POA: Diagnosis not present

## 2017-09-23 DIAGNOSIS — D649 Anemia, unspecified: Secondary | ICD-10-CM

## 2017-09-23 DIAGNOSIS — F0281 Dementia in other diseases classified elsewhere with behavioral disturbance: Secondary | ICD-10-CM

## 2017-09-23 DIAGNOSIS — Z0000 Encounter for general adult medical examination without abnormal findings: Secondary | ICD-10-CM | POA: Diagnosis not present

## 2017-09-23 DIAGNOSIS — E039 Hypothyroidism, unspecified: Secondary | ICD-10-CM

## 2017-09-23 DIAGNOSIS — Z23 Encounter for immunization: Secondary | ICD-10-CM

## 2017-09-23 DIAGNOSIS — G309 Alzheimer's disease, unspecified: Secondary | ICD-10-CM | POA: Diagnosis not present

## 2017-09-23 DIAGNOSIS — I509 Heart failure, unspecified: Secondary | ICD-10-CM | POA: Diagnosis not present

## 2017-09-23 DIAGNOSIS — E109 Type 1 diabetes mellitus without complications: Secondary | ICD-10-CM | POA: Diagnosis not present

## 2017-09-23 LAB — CBC WITH DIFFERENTIAL/PLATELET
BASOS ABS: 0 10*3/uL (ref 0.0–0.1)
Basophils Relative: 0.7 % (ref 0.0–3.0)
EOS ABS: 0.2 10*3/uL (ref 0.0–0.7)
Eosinophils Relative: 4 % (ref 0.0–5.0)
HEMATOCRIT: 31.9 % — AB (ref 36.0–46.0)
HEMOGLOBIN: 10.8 g/dL — AB (ref 12.0–15.0)
LYMPHS PCT: 53.1 % — AB (ref 12.0–46.0)
Lymphs Abs: 2.6 10*3/uL (ref 0.7–4.0)
MCHC: 33.8 g/dL (ref 30.0–36.0)
MCV: 85.9 fl (ref 78.0–100.0)
MONO ABS: 0.4 10*3/uL (ref 0.1–1.0)
Monocytes Relative: 7.3 % (ref 3.0–12.0)
Neutro Abs: 1.7 10*3/uL (ref 1.4–7.7)
Neutrophils Relative %: 34.9 % — ABNORMAL LOW (ref 43.0–77.0)
Platelets: 283 10*3/uL (ref 150.0–400.0)
RBC: 3.72 Mil/uL — AB (ref 3.87–5.11)
RDW: 14.2 % (ref 11.5–15.5)
WBC: 4.9 10*3/uL (ref 4.0–10.5)

## 2017-09-23 LAB — BASIC METABOLIC PANEL
BUN: 19 mg/dL (ref 6–23)
CHLORIDE: 99 meq/L (ref 96–112)
CO2: 29 mEq/L (ref 19–32)
CREATININE: 0.97 mg/dL (ref 0.40–1.20)
Calcium: 9.7 mg/dL (ref 8.4–10.5)
GFR: 72.28 mL/min (ref >60.00)
Glucose, Bld: 268 mg/dL — ABNORMAL HIGH (ref 70–99)
Potassium: 4.6 mEq/L (ref 3.5–5.1)
Sodium: 139 mEq/L (ref 135–145)

## 2017-09-23 LAB — HEPATIC FUNCTION PANEL
ALBUMIN: 3.3 g/dL — AB (ref 3.5–5.2)
ALK PHOS: 54 U/L (ref 39–117)
ALT: 16 U/L (ref 0–35)
AST: 14 U/L (ref 0–37)
Bilirubin, Direct: 0.1 mg/dL (ref 0.0–0.3)
TOTAL PROTEIN: 6.4 g/dL (ref 6.0–8.3)
Total Bilirubin: 0.4 mg/dL (ref 0.2–1.2)

## 2017-09-23 LAB — IBC PANEL
IRON: 65 ug/dL (ref 42–145)
SATURATION RATIOS: 22 % (ref 20.0–50.0)
TRANSFERRIN: 211 mg/dL — AB (ref 212.0–360.0)

## 2017-09-23 LAB — LIPID PANEL
CHOL/HDL RATIO: 4
Cholesterol: 197 mg/dL (ref 0–200)
HDL: 52 mg/dL (ref >39.00)
LDL Cholesterol: 123 mg/dL — ABNORMAL HIGH (ref 0–99)
NONHDL: 144.62
TRIGLYCERIDES: 106 mg/dL (ref 0.0–149.0)
VLDL: 21.2 mg/dL (ref 0.0–40.0)

## 2017-09-23 LAB — TSH: TSH: 6.33 u[IU]/mL — AB (ref 0.35–4.50)

## 2017-09-23 LAB — VITAMIN B12: VITAMIN B 12: 519 pg/mL (ref 211–911)

## 2017-09-23 MED ORDER — LEVOTHYROXINE SODIUM 25 MCG PO TABS: 25.0000 ug | ORAL_TABLET | Freq: Every day | ORAL | 11 refills | Status: DC

## 2017-09-23 NOTE — Progress Notes (Signed)
Subjective:    Patient ID: Dana [name], female    DOB: 1946-03-20, 72 y.o.   MRN: [id number]  HPI  Husband provides hx, due to pt's dementia.  Pt is here for regular wellness examination, and is feeling pretty well in general, and says chronic med probs are stable, except as noted below Past Medical History:  Diagnosis Date  . ALLERGIC RHINITIS 02/08/2007  . Anemia    takes iron  . ANXIETY 02/08/2007  . CHF (congestive heart failure) (HCC)   . Dementia   . DEPRESSION 02/08/2007  . DIABETES MELLITUS, TYPE I 02/08/2007  . DM retinopathy (HCC)    background  . Dyslipidemia   . Headache(784.0) 11/06/2008  . HYPERCHOLESTEROLEMIA 11/05/2008  . HYPERTENSION 02/08/2007  . Leukopenia   . Non-ulcer dyspepsia   . OA (osteoarthritis) 1990   disabled due to OA  . Seizures (HCC)   . Skin abnormality    both knees with opne ares and left shoulder with open areas, changes dressings prn  . URINARY INCONTINENCE 02/08/2007    Past Surgical History:  Procedure Laterality Date  . ABDOMINAL HYSTERECTOMY    . bladder tach  years ago  . COLONOSCOPY WITH PROPOFOL N/A 06/21/2014   Procedure: COLONOSCOPY WITH PROPOFOL;  Surgeon: Rachael Fee, MD;  Location: [facility];  Service: Endoscopy;  Laterality: N/A;  . ELECTROCARDIOGRAM  05/17/2006  . PANENDOSCOPY  10/27/1999  . Stress Cardiolite  12/14/2003  . TOTAL KNEE ARTHROPLASTY  1990   left    Social History   Socioeconomic History  . Marital status: Married    Spouse name: Not on file  . Number of children: 2  . Years of education: Not on file  . Highest education level: Not on file  Social Needs  . Financial resource strain: Not on file  . Food insecurity - worry: Not on file  . Food insecurity - inability: Not on file  . Transportation needs - medical: Not on file  . Transportation needs - non-medical: Not on file  Occupational History  . Occupation: Retired    Associate Professor: UNEMPLOYED  Tobacco Use  . Smoking status: Never Smoker  .  Smokeless tobacco: Never Used  Substance and Sexual Activity  . Alcohol use: No    Alcohol/week: 0.0 oz  . Drug use: No  . Sexual activity: No  Other Topics Concern  . Not on file  Social History Narrative  . Not on file    Current Outpatient Medications on File Prior to Visit  Medication Sig Dispense Refill  . aspirin EC 325 MG tablet TAKE 1 TABLET (325 MG TOTAL) BY MOUTH DAILY. 30 tablet 0  . carvedilol (COREG) 3.125 MG tablet TAKE 1 TABLET (3.125 MG TOTAL) BY MOUTH 2 (TWO) TIMES DAILY WITH A MEAL. 60 tablet 11  . cetirizine (ZYRTEC) 10 MG tablet Take 10 mg by mouth daily as needed for allergies.    . citalopram (CELEXA) 20 MG tablet Take 20 mg by mouth daily.    Marland Kitchen donepezil (ARICEPT) 10 MG tablet Take 10 mg by mouth daily.  4  . furosemide (LASIX) 40 MG tablet Take 40mg  every other day, alternating with 20mg  every other day. (Patient taking differently: 20 mg. Take 40mg  every other day, alternating with 20mg  every other day.) 75 tablet 3  . hyoscyamine (LEVSIN SL) 0.125 MG SL tablet PLACE 1 TABLET UNDER THE TONGUE TWICE DAILY AS NEEDED FOR CRAMPS AND SPASMS. 60 tablet 0  . JANUMET 50-1000 MG tablet TAKE  1 TABLET BY MOUTH 2 (TWO) TIMES DAILY WITH A MEAL. 180 tablet 0  . loperamide (IMODIUM) 2 MG capsule Take 1 capsule (2 mg total) by mouth as needed for diarrhea or loose stools. 30 capsule 0  . mirtazapine (REMERON SOL-TAB) 30 MG disintegrating tablet Take 30 mg by mouth at bedtime.     Marland Kitchen NAMENDA XR 28 MG CP24 24 hr capsule Take 28 mg by mouth daily.     Marland Kitchen Propylene Glycol (SYSTANE BALANCE OP) Apply 1 drop to eye 3 (three) times daily as needed (dry eyes).    . sodium chloride (OCEAN) 0.65 % SOLN nasal spray Place 2 sprays into both nostrils as needed for congestion.    . Valproate Sodium (VALPROIC ACID) 250 MG/5ML SOLN Take 10 mls daily 300 mL 11   No current facility-administered medications on file prior to visit.     Allergies  Allergen Reactions  . Aspirin Nausea Only  .  Codeine Nausea Only    Family History  Problem Relation Age of Onset  . Alcohol abuse Son   . Cancer Neg Hx        no cancer in immediate family  . CAD Father   . Colon cancer Neg Hx   . Colon polyps Neg Hx   . Diabetes Father   . Kidney disease Neg Hx   . Esophageal cancer Neg Hx   . Gallbladder disease Neg Hx     BP 101/75 (BP Location: Left Arm, Patient Position: Sitting, Cuff Size: Normal)   Pulse (!) 106   SpO2 96%    Review of Systems Denies fever, hearing loss, chest pain, sob, back pain, cold intolerance, BRBPR, hematuria, syncope, numbness, easy bruising, and rash.  She has fatigue.  No change in chronic visual loss or rhinorrhea.     Objective:   Physical Exam VS: see vs page GEN: no distress.  In wheelchair HEAD: head: no deformity eyes: no periorbital swelling, no proptosis external nose and ears are normal mouth: no lesion seen NECK: supple, thyroid is not enlarged CHEST WALL: no deformity LUNGS: clear to auscultation CV: reg rate and rhythm, no murmur ABD: abdomen is soft, nontender.  no hepatosplenomegaly.  not distended.  no hernia MUSCULOSKELETAL: muscle bulk and strength are grossly normal.  no obvious joint swelling.  gait is normal and steady EXTEMITIES: no deformity.  no ulcer on the feet.  feet are of normal color and temp.  no edema.  There is bilateral onychomycosis of the toenails.  PULSES: dorsalis pedis intact bilat.  no carotid bruit NEURO:  cn 2-12 grossly intact.   readily moves all 4's.  sensation is intact to touch on the feet. SKIN:  Normal texture and temperature.  No rash or suspicious lesion is visible.   NODES:  None palpable at the neck     Assessment & Plan:  Wellness visit today, with problems stable, except as noted.   Patient Instructions  blood tests are requested for you today.  We'll let you know about the results. Please call us, to read off her medications. Please come back for a follow-up appointment in 1  month. Please consider these measures for your health:  minimize alcohol.  Do not use tobacco products.  Have a colonoscopy at least every 10 years from age 3.  Women should have an annual mammogram from age 1.  Keep firearms safely stored.  Always use seat belts.  have working smoke alarms in your home.  See an eye doctor  and dentist regularly.  Never drive under the influence of alcohol or drugs (including prescription drugs).      SEPARATE EVALUATION FOLLOWS--EACH PROBLEM HERE IS NEW, NOT RESPONDING TO TREATMENT, OR POSES SIGNIFICANT RISK TO THE PATIENT'S HEALTH: HISTORY OF THE PRESENT ILLNESS: Pt is noted to be very drowsy today.  Husband says this is chronic PAST MEDICAL HISTORY Past Medical History:  Diagnosis Date  . ALLERGIC RHINITIS 02/08/2007  . Anemia    takes iron  . ANXIETY 02/08/2007  . CHF (congestive heart failure) (HCC)   . Dementia   . DEPRESSION 02/08/2007  . DIABETES MELLITUS, TYPE I 02/08/2007  . DM retinopathy (HCC)    background  . Dyslipidemia   . Headache(784.0) 11/06/2008  . HYPERCHOLESTEROLEMIA 11/05/2008  . HYPERTENSION 02/08/2007  . Leukopenia   . Non-ulcer dyspepsia   . OA (osteoarthritis) 1990   disabled due to OA  . Seizures (HCC)   . Skin abnormality    both knees with opne ares and left shoulder with open areas, changes dressings prn  . URINARY INCONTINENCE 02/08/2007    Past Surgical History:  Procedure Laterality Date  . ABDOMINAL HYSTERECTOMY    . bladder tach  years ago  . COLONOSCOPY WITH PROPOFOL N/A 06/21/2014   Procedure: COLONOSCOPY WITH PROPOFOL;  Surgeon: Rachael Fee, MD;  Location: [facility];  Service: Endoscopy;  Laterality: N/A;  . ELECTROCARDIOGRAM  05/17/2006  . PANENDOSCOPY  10/27/1999  . Stress Cardiolite  12/14/2003  . TOTAL KNEE ARTHROPLASTY  1990   left    Social History   Socioeconomic History  . Marital status: Married    Spouse name: Not on file  . Number of children: 2  . Years of education: Not on file  .  Highest education level: Not on file  Social Needs  . Financial resource strain: Not on file  . Food insecurity - worry: Not on file  . Food insecurity - inability: Not on file  . Transportation needs - medical: Not on file  . Transportation needs - non-medical: Not on file  Occupational History  . Occupation: Retired    Associate Professor: UNEMPLOYED  Tobacco Use  . Smoking status: Never Smoker  . Smokeless tobacco: Never Used  Substance and Sexual Activity  . Alcohol use: No    Alcohol/week: 0.0 oz  . Drug use: No  . Sexual activity: No  Other Topics Concern  . Not on file  Social History Narrative  . Not on file    Current Outpatient Medications on File Prior to Visit  Medication Sig Dispense Refill  . aspirin EC 325 MG tablet TAKE 1 TABLET (325 MG TOTAL) BY MOUTH DAILY. 30 tablet 0  . carvedilol (COREG) 3.125 MG tablet TAKE 1 TABLET (3.125 MG TOTAL) BY MOUTH 2 (TWO) TIMES DAILY WITH A MEAL. 60 tablet 11  . cetirizine (ZYRTEC) 10 MG tablet Take 10 mg by mouth daily as needed for allergies.    . citalopram (CELEXA) 20 MG tablet Take 20 mg by mouth daily.    Marland Kitchen donepezil (ARICEPT) 10 MG tablet Take 10 mg by mouth daily.  4  . furosemide (LASIX) 40 MG tablet Take 40mg  every other day, alternating with 20mg  every other day. (Patient taking differently: 20 mg. Take 40mg  every other day, alternating with 20mg  every other day.) 75 tablet 3  . hyoscyamine (LEVSIN SL) 0.125 MG SL tablet PLACE 1 TABLET UNDER THE TONGUE TWICE DAILY AS NEEDED FOR CRAMPS AND SPASMS. 60 tablet 0  . JANUMET  50-1000 MG tablet TAKE 1 TABLET BY MOUTH 2 (TWO) TIMES DAILY WITH A MEAL. 180 tablet 0  . loperamide (IMODIUM) 2 MG capsule Take 1 capsule (2 mg total) by mouth as needed for diarrhea or loose stools. 30 capsule 0  . mirtazapine (REMERON SOL-TAB) 30 MG disintegrating tablet Take 30 mg by mouth at bedtime.     Marland Kitchen. NAMENDA XR 28 MG CP24 24 hr capsule Take 28 mg by mouth daily.     Marland Kitchen. Propylene Glycol (SYSTANE BALANCE OP)  Apply 1 drop to eye 3 (three) times daily as needed (dry eyes).    . sodium chloride (OCEAN) 0.65 % SOLN nasal spray Place 2 sprays into both nostrils as needed for congestion.    . Valproate Sodium (VALPROIC ACID) 250 MG/5ML SOLN Take 10 mls daily 300 mL 11   No current facility-administered medications on file prior to visit.     Allergies  Allergen Reactions  . Aspirin Nausea Only  . Codeine Nausea Only    Family History  Problem Relation Age of Onset  . Alcohol abuse Son   . Cancer Neg Hx        no cancer in immediate family  . CAD Father   . Colon cancer Neg Hx   . Colon polyps Neg Hx   . Diabetes Father   . Kidney disease Neg Hx   . Esophageal cancer Neg Hx   . Gallbladder disease Neg Hx     BP 101/75 (BP Location: Left Arm, Patient Position: Sitting, Cuff Size: Normal)   Pulse (!) 106   SpO2 96%   REVIEW OF SYSTEMS: No change in chronic depression PHYSICAL EXAMINATION: VITAL SIGNS:  See vs page GENERAL: no distress PSYCH: drowsy.  speaks very little.  Unable to assess orientation.  LAB/XRAY RESULTS: Lab Results  Component Value Date   TSH 6.33 (H) 09/23/2017  IMPRESSION: Hypothyroidism: she needs rx, in view of dementia.  PLAN:  Increase synthroid I asked husb to call us with med list

## 2017-09-23 NOTE — Patient Instructions (Addendum)
blood tests are requested for you today.  We'll let you know about the results. Please call us, to read off her medications. Please come back for a follow-up appointment in 1 month. Please consider these measures for your health:  minimize alcohol.  Do not use tobacco products.  Have a colonoscopy at least every 10 years from age 72.  Women should have an annual mammogram from age 8940.  Keep firearms safely stored.  Always use seat belts.  have working smoke alarms in your home.  See an eye doctor and dentist regularly.  Never drive under the influence of alcohol or drugs (including prescription drugs).

## 2017-09-24 ENCOUNTER — Ambulatory Visit: Payer: Self-pay | Admitting: Neurology

## 2017-09-24 LAB — HEPATITIS C ANTIBODY
Hepatitis C Ab: NONREACTIVE
SIGNAL TO CUT-OFF: 0.17 (ref <1.00)

## 2017-09-27 ENCOUNTER — Other Ambulatory Visit: Payer: Self-pay

## 2017-09-27 ENCOUNTER — Telehealth: Payer: Self-pay

## 2017-09-27 ENCOUNTER — Ambulatory Visit: Payer: Self-pay | Admitting: Neurology

## 2017-09-27 MED ORDER — MIRTAZAPINE 30 MG PO TABS: 30.0000 mg | ORAL_TABLET | Freq: Every day | ORAL | 11 refills | Status: DC

## 2017-09-27 MED ORDER — SITAGLIP PHOS-METFORMIN HCL ER 50-1000 MG PO TB24: 2.0000 | ORAL_TABLET | ORAL | 11 refills | Status: AC

## 2017-09-27 MED ORDER — CITALOPRAM HYDROBROMIDE 20 MG PO TABS: 20.0000 mg | ORAL_TABLET | Freq: Every day | ORAL | 11 refills | Status: AC

## 2017-09-27 MED ORDER — ISOSORBIDE DINITRATE 5 MG PO TABS: 5.0000 mg | ORAL_TABLET | Freq: Three times a day (TID) | ORAL | 3 refills | Status: AC

## 2017-09-27 MED ORDER — GLIMEPIRIDE 1 MG PO TABS: 1.0000 mg | ORAL_TABLET | Freq: Every day | ORAL | 11 refills | Status: AC

## 2017-09-27 NOTE — Telephone Encounter (Signed)
Medications patient is taking. Senna  Isosorbide Glimepiride Citalopram Asprin pain reviver Mirtazapine levothryoxine  furosemide   B/s is out of control, it was 349 this morning   426 right now.

## 2017-09-27 NOTE — Telephone Encounter (Signed)
Please verify no n/v/sob I have sent a prescription to your pharmacy, to add back janumet. F/u ov here in a few days.

## 2017-09-27 NOTE — Telephone Encounter (Signed)
Patient's husband also needs medication glimepride, citalopram, mirtazapine & the isosorbide.I have made patient f/u for the 20th which is first available. He stated that he couldn't bring patient in a few days anyway because she has hurt her leg & it's hard for patient's husband to move her. Please advise on refills because they are not on current med list & weren't sent in at visit?

## 2017-09-27 NOTE — Telephone Encounter (Signed)
rxs sent

## 2017-09-27 NOTE — Telephone Encounter (Signed)
Patient's husband is very concerned because patient's blood sugars are very high. This morning 349 & this afternoon 426. They were also running high yesterday. Husband states that all patient takes is glimepride & she is almost out so he needs a refill of that as well. Please advise?

## 2017-09-28 ENCOUNTER — Ambulatory Visit: Payer: Self-pay | Admitting: Endocrinology

## 2017-10-01 DIAGNOSIS — G309 Alzheimer's disease, unspecified: Secondary | ICD-10-CM | POA: Diagnosis not present

## 2017-10-01 DIAGNOSIS — F319 Bipolar disorder, unspecified: Secondary | ICD-10-CM | POA: Diagnosis not present

## 2017-10-01 DIAGNOSIS — F0281 Dementia in other diseases classified elsewhere with behavioral disturbance: Secondary | ICD-10-CM | POA: Diagnosis not present

## 2017-10-01 DIAGNOSIS — L89153 Pressure ulcer of sacral region, stage 3: Secondary | ICD-10-CM | POA: Diagnosis not present

## 2017-10-01 DIAGNOSIS — I5042 Chronic combined systolic (congestive) and diastolic (congestive) heart failure: Secondary | ICD-10-CM | POA: Diagnosis not present

## 2017-10-01 DIAGNOSIS — E119 Type 2 diabetes mellitus without complications: Secondary | ICD-10-CM | POA: Diagnosis not present

## 2017-10-01 DIAGNOSIS — I447 Left bundle-branch block, unspecified: Secondary | ICD-10-CM | POA: Diagnosis not present

## 2017-10-01 DIAGNOSIS — G40909 Epilepsy, unspecified, not intractable, without status epilepticus: Secondary | ICD-10-CM | POA: Diagnosis not present

## 2017-10-01 DIAGNOSIS — I110 Hypertensive heart disease with heart failure: Secondary | ICD-10-CM | POA: Diagnosis not present

## 2017-10-04 ENCOUNTER — Telehealth: Payer: Self-pay | Admitting: Endocrinology

## 2017-10-04 NOTE — Telephone Encounter (Signed)
Does pt have a wound?

## 2017-10-04 NOTE — Telephone Encounter (Signed)
Tammy notified of verbal ok. She had no further questions at this time.

## 2017-10-04 NOTE — Telephone Encounter (Signed)
I contacted the nurse and provided verbal ok via voicemail.

## 2017-10-04 NOTE — Telephone Encounter (Signed)
Tammy with Hospice needs to speak with you re: patient's decline and husband wanting Hospice for patient again. Please call Tammy at ph# [phone] asap

## 2017-10-04 NOTE — Telephone Encounter (Signed)
OK 

## 2017-10-04 NOTE — Telephone Encounter (Signed)
I contacted Tammy with hospice. She stated 2 months ago the patient had been discharged from hospice care due to her condition improving. However, since Friday the patient has started to decline. She is sleeping more, pocketing food, respirations are irregular with periods of apnea. Since being discharged the patient was set up in a program where the nurse could go out an assess if the clients status was to change and the home health nurse called to advise on the clients condition on Friday, the hospice nurse went and assessed the patient today. The patient's husband stated Mrs. Dana [name] is completely bed ridden and Tammy wanted to know if we could give the verbal order for the client to be placed in the hospice home. Since she was discharged from full hospice care the attending physician cannot give the order to place her in the hospice home. Please advise if we can give this verbal order.

## 2017-10-04 NOTE — Telephone Encounter (Signed)
See message and please advise, Thanks!  

## 2017-10-04 NOTE — Telephone Encounter (Signed)
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 The client is completley bed ridden and would not be able to make it to a wound care center.

## 2017-10-04 NOTE — Telephone Encounter (Signed)
Dana [name] from [facility] need order for group two support service, Santyl dressing foam dressing.  Please advise [phone]

## 2017-10-06 ENCOUNTER — Ambulatory Visit: Payer: Self-pay | Admitting: Endocrinology

## 2017-10-06 DIAGNOSIS — Z0289 Encounter for other administrative examinations: Secondary | ICD-10-CM

## 2017-10-18 ENCOUNTER — Other Ambulatory Visit: Payer: Self-pay | Admitting: Endocrinology

## 2017-10-18 DIAGNOSIS — Z1231 Encounter for screening mammogram for malignant neoplasm of breast: Secondary | ICD-10-CM

## 2017-10-25 ENCOUNTER — Ambulatory Visit: Payer: Self-pay | Admitting: Endocrinology

## 2017-11-09 ENCOUNTER — Telehealth: Payer: Self-pay | Admitting: Endocrinology

## 2017-11-09 NOTE — Telephone Encounter (Signed)
[facility] is calling in regards to paperwork they have faxed over for Dr Everardo AllEllison. This would have been orders faxed over   Devin.   [phone]

## 2017-11-10 NOTE — Telephone Encounter (Signed)
I have called the number with ext. Given, but does not ring to Devin. I LVM that I have faxed papework.

## 2018-05-09 ENCOUNTER — Telehealth: Payer: Self-pay | Admitting: Endocrinology

## 2018-05-09 NOTE — Telephone Encounter (Signed)
[facility] called in reference to paperwork that was faxed on 10/7 regarding diabetic supplies. Please Advise. Ph # [phone]

## 2018-05-10 NOTE — Telephone Encounter (Signed)
Pt will require an appt before [facility] paperwork can be completed. Message sent to [facility] for scheduling purposes.

## 2018-05-10 NOTE — Telephone Encounter (Signed)
Unable to reach patient- home number is no good LM on spouse VM

## 2018-05-11 ENCOUNTER — Telehealth: Payer: Self-pay

## 2018-05-11 NOTE — Telephone Encounter (Signed)
Letter mailed today for pt to schedule appt to complete paperwork.

## 2018-05-11 NOTE — Telephone Encounter (Signed)
Second attempt to reach pt to schedule appt for completion of [facility] Paperwork. Unable to LVM on home #. LVM on spouse #. Letter also mailed to inform pt of failed attempts to schedule appt.

## 2018-05-17 ENCOUNTER — Telehealth: Payer: Self-pay

## 2018-05-17 NOTE — Telephone Encounter (Signed)
Received prescription request from [facility]. Dr. Everardo All unable to complete without an appt. Second attempt to reach pt has been unsuccessful. Letter mailed requesting pt call to schedule an appt. (Paperwork needing completed can be found in referrals folder at [employer].)

## 2018-07-07 ENCOUNTER — Other Ambulatory Visit: Payer: Self-pay | Admitting: Endocrinology

## 2018-10-13 ENCOUNTER — Other Ambulatory Visit: Payer: Self-pay | Admitting: Endocrinology

## 2018-10-14 NOTE — Telephone Encounter (Signed)
Please refill x 3 months Further refills would have to be considered by a new PCP   

## 2018-10-26 ENCOUNTER — Telehealth: Payer: Self-pay

## 2018-10-26 NOTE — Telephone Encounter (Signed)
Letter mailed advising she call to schedule an appt.

## 2018-10-26 NOTE — Telephone Encounter (Signed)
Received notification from [facility] requesting completion of CMN. Dr. Everardo All denied request stating pt is in need of an appt. States he will address this request during pt visit. Message routed to Noelle Penner for scheduling purposes. Once appt has been scheduled, will provide Dr. Everardo All with required documents for his reference during visit.

## 2018-10-26 NOTE — Telephone Encounter (Signed)
Letter mailed

## 2018-12-13 ENCOUNTER — Other Ambulatory Visit: Payer: Self-pay | Admitting: Endocrinology

## 2018-12-13 NOTE — Telephone Encounter (Signed)
Please refill x 3 months Further refills would have to be considered by new PCP   

## 2019-02-18 DEATH — deceased
